# Patient Record
Sex: Female | Born: 1935
Health system: Southern US, Community
[De-identification: ages and names within clinical notes are randomized; demographics above are authoritative.]

## PROBLEM LIST (undated history)

## (undated) DIAGNOSIS — I10 Essential (primary) hypertension: Secondary | ICD-10-CM

## (undated) DIAGNOSIS — I5032 Chronic diastolic (congestive) heart failure: Secondary | ICD-10-CM

## (undated) DIAGNOSIS — C3490 Malignant neoplasm of unspecified part of unspecified bronchus or lung: Secondary | ICD-10-CM

## (undated) DIAGNOSIS — I2510 Atherosclerotic heart disease of native coronary artery without angina pectoris: Secondary | ICD-10-CM

## (undated) DIAGNOSIS — J45909 Unspecified asthma, uncomplicated: Secondary | ICD-10-CM

## (undated) DIAGNOSIS — I214 Non-ST elevation (NSTEMI) myocardial infarction: Secondary | ICD-10-CM

## (undated) DIAGNOSIS — I6529 Occlusion and stenosis of unspecified carotid artery: Secondary | ICD-10-CM

## (undated) DIAGNOSIS — J449 Chronic obstructive pulmonary disease, unspecified: Secondary | ICD-10-CM

## (undated) HISTORY — PX: KNEE SURGERY: SHX244

## (undated) HISTORY — PX: VESICOVAGINAL FISTULA CLOSURE W/ TAH: SUR271

## (undated) HISTORY — PX: FOOT SURGERY: SHX648

## (undated) HISTORY — DX: Occlusion and stenosis of unspecified carotid artery: I65.29

## (undated) HISTORY — DX: Chronic obstructive pulmonary disease, unspecified: J44.9

## (undated) HISTORY — DX: Non-ST elevation (NSTEMI) myocardial infarction: I21.4

## (undated) HISTORY — DX: Essential (primary) hypertension: I10

## (undated) HISTORY — PX: LUNG LOBECTOMY: SHX167

## (undated) HISTORY — PX: OTHER SURGICAL HISTORY: SHX169

## (undated) HISTORY — DX: Malignant neoplasm of unspecified part of unspecified bronchus or lung: C34.90

## (undated) HISTORY — DX: Atherosclerotic heart disease of native coronary artery without angina pectoris: I25.10

## (undated) HISTORY — PX: GALLBLADDER SURGERY: SHX652

## (undated) HISTORY — DX: Chronic diastolic (congestive) heart failure: I50.32

---

## 2001-02-20 ENCOUNTER — Encounter: Payer: Self-pay | Admitting: Orthopedic Surgery

## 2001-02-20 ENCOUNTER — Encounter: Admission: RE | Admit: 2001-02-20 | Discharge: 2001-02-20 | Payer: Self-pay | Admitting: Orthopedic Surgery

## 2009-01-09 ENCOUNTER — Inpatient Hospital Stay (HOSPITAL_COMMUNITY): Admission: EM | Admit: 2009-01-09 | Discharge: 2009-01-13 | Payer: Self-pay | Admitting: Emergency Medicine

## 2009-01-09 ENCOUNTER — Encounter (INDEPENDENT_AMBULATORY_CARE_PROVIDER_SITE_OTHER): Payer: Self-pay | Admitting: Interventional Radiology

## 2009-01-09 ENCOUNTER — Ambulatory Visit: Payer: Self-pay | Admitting: Cardiovascular Disease

## 2009-01-10 ENCOUNTER — Encounter (INDEPENDENT_AMBULATORY_CARE_PROVIDER_SITE_OTHER): Payer: Self-pay | Admitting: Internal Medicine

## 2009-01-11 ENCOUNTER — Ambulatory Visit: Payer: Self-pay | Admitting: Oncology

## 2009-01-12 ENCOUNTER — Ambulatory Visit: Payer: Self-pay | Admitting: Surgery

## 2009-01-13 ENCOUNTER — Ambulatory Visit: Payer: Self-pay | Admitting: Oncology

## 2009-01-16 ENCOUNTER — Ambulatory Visit (HOSPITAL_COMMUNITY): Admission: RE | Admit: 2009-01-16 | Discharge: 2009-01-16 | Payer: Self-pay | Admitting: Oncology

## 2009-01-21 ENCOUNTER — Ambulatory Visit: Payer: Self-pay | Admitting: Surgery

## 2009-01-24 ENCOUNTER — Ambulatory Visit: Admission: RE | Admit: 2009-01-24 | Discharge: 2009-04-23 | Payer: Self-pay | Admitting: Radiation Oncology

## 2009-02-10 ENCOUNTER — Inpatient Hospital Stay (HOSPITAL_COMMUNITY): Admission: RE | Admit: 2009-02-10 | Discharge: 2009-02-16 | Payer: Self-pay | Admitting: Surgery

## 2009-02-10 ENCOUNTER — Ambulatory Visit: Payer: Self-pay | Admitting: Cardiothoracic Surgery

## 2009-02-10 ENCOUNTER — Encounter: Payer: Self-pay | Admitting: Cardiothoracic Surgery

## 2009-02-20 ENCOUNTER — Ambulatory Visit: Payer: Self-pay | Admitting: Cardiothoracic Surgery

## 2009-02-24 ENCOUNTER — Ambulatory Visit: Payer: Self-pay | Admitting: Oncology

## 2009-02-24 LAB — COMPREHENSIVE METABOLIC PANEL
Albumin: 3.9 g/dL (ref 3.5–5.2)
BUN: 25 mg/dL — ABNORMAL HIGH (ref 6–23)
CO2: 23 mEq/L (ref 19–32)
Calcium: 9.3 mg/dL (ref 8.4–10.5)
Chloride: 102 mEq/L (ref 96–112)
Creatinine, Ser: 0.94 mg/dL (ref 0.40–1.20)
Glucose, Bld: 106 mg/dL — ABNORMAL HIGH (ref 70–99)
Potassium: 4.5 mEq/L (ref 3.5–5.3)

## 2009-02-24 LAB — CBC WITH DIFFERENTIAL/PLATELET
Basophils Absolute: 0 10*3/uL (ref 0.0–0.1)
Eosinophils Absolute: 0.3 10*3/uL (ref 0.0–0.5)
HCT: 39.4 % (ref 34.8–46.6)
HGB: 13.1 g/dL (ref 11.6–15.9)
NEUT#: 10.9 10*3/uL — ABNORMAL HIGH (ref 1.5–6.5)
NEUT%: 74.7 % (ref 38.4–76.8)
RDW: 14 % (ref 11.2–14.5)
lymph#: 2.3 10*3/uL (ref 0.9–3.3)

## 2009-03-06 ENCOUNTER — Encounter: Admission: RE | Admit: 2009-03-06 | Discharge: 2009-03-06 | Payer: Self-pay | Admitting: Cardiothoracic Surgery

## 2009-03-06 ENCOUNTER — Ambulatory Visit: Payer: Self-pay | Admitting: Cardiothoracic Surgery

## 2009-03-31 ENCOUNTER — Ambulatory Visit: Payer: Self-pay | Admitting: Cardiothoracic Surgery

## 2009-03-31 ENCOUNTER — Encounter: Admission: RE | Admit: 2009-03-31 | Discharge: 2009-03-31 | Payer: Self-pay | Admitting: Cardiothoracic Surgery

## 2009-06-03 ENCOUNTER — Ambulatory Visit: Payer: Self-pay | Admitting: Oncology

## 2009-06-05 ENCOUNTER — Ambulatory Visit (HOSPITAL_COMMUNITY): Admission: RE | Admit: 2009-06-05 | Discharge: 2009-06-05 | Payer: Self-pay | Admitting: Oncology

## 2009-06-05 LAB — CBC WITH DIFFERENTIAL/PLATELET
BASO%: 0.2 % (ref 0.0–2.0)
HGB: 15.2 g/dL (ref 11.6–15.9)
MONO#: 0.7 10*3/uL (ref 0.1–0.9)
MONO%: 8.1 % (ref 0.0–14.0)
Platelets: 341 10*3/uL (ref 145–400)

## 2009-06-05 LAB — COMPREHENSIVE METABOLIC PANEL
Albumin: 4 g/dL (ref 3.5–5.2)
BUN: 23 mg/dL (ref 6–23)
CO2: 28 mEq/L (ref 19–32)
Calcium: 9.4 mg/dL (ref 8.4–10.5)
Chloride: 103 mEq/L (ref 96–112)
Glucose, Bld: 102 mg/dL — ABNORMAL HIGH (ref 70–99)
Potassium: 3.8 mEq/L (ref 3.5–5.3)

## 2009-12-05 ENCOUNTER — Ambulatory Visit: Payer: Self-pay | Admitting: Oncology

## 2009-12-09 ENCOUNTER — Ambulatory Visit (HOSPITAL_COMMUNITY): Admission: RE | Admit: 2009-12-09 | Discharge: 2009-12-09 | Payer: Self-pay | Admitting: Oncology

## 2009-12-09 LAB — CBC WITH DIFFERENTIAL/PLATELET
BASO%: 0.3 % (ref 0.0–2.0)
Basophils Absolute: 0 10*3/uL (ref 0.0–0.1)
HCT: 41.3 % (ref 34.8–46.6)
HGB: 14 g/dL (ref 11.6–15.9)
LYMPH%: 22.4 % (ref 14.0–49.7)
MCH: 28.5 pg (ref 25.1–34.0)
MCHC: 34 g/dL (ref 31.5–36.0)
NEUT%: 67.5 % (ref 38.4–76.8)
Platelets: 362 10*3/uL (ref 145–400)
lymph#: 2 10*3/uL (ref 0.9–3.3)

## 2009-12-09 LAB — COMPREHENSIVE METABOLIC PANEL
ALT: 23 U/L (ref 0–35)
AST: 19 U/L (ref 0–37)
Alkaline Phosphatase: 141 U/L — ABNORMAL HIGH (ref 39–117)
BUN: 22 mg/dL (ref 6–23)
Chloride: 106 mEq/L (ref 96–112)
Creatinine, Ser: 0.8 mg/dL (ref 0.40–1.20)
Sodium: 141 mEq/L (ref 135–145)
Total Bilirubin: 0.4 mg/dL (ref 0.3–1.2)
Total Protein: 7.3 g/dL (ref 6.0–8.3)

## 2010-01-20 ENCOUNTER — Inpatient Hospital Stay (HOSPITAL_COMMUNITY): Admission: EM | Admit: 2010-01-20 | Discharge: 2010-01-24 | Payer: Self-pay | Admitting: Emergency Medicine

## 2010-01-20 ENCOUNTER — Ambulatory Visit: Payer: Self-pay | Admitting: Cardiology

## 2010-01-30 ENCOUNTER — Ambulatory Visit: Payer: Self-pay | Admitting: Cardiology

## 2010-01-30 ENCOUNTER — Encounter: Admission: RE | Admit: 2010-01-30 | Discharge: 2010-01-30 | Payer: Self-pay | Admitting: Cardiology

## 2010-02-11 ENCOUNTER — Ambulatory Visit: Payer: Self-pay | Admitting: Cardiology

## 2010-03-06 ENCOUNTER — Ambulatory Visit: Payer: Self-pay | Admitting: Cardiology

## 2010-04-06 ENCOUNTER — Ambulatory Visit: Payer: Self-pay | Admitting: Cardiology

## 2010-05-15 ENCOUNTER — Other Ambulatory Visit: Payer: Self-pay | Admitting: Oncology

## 2010-05-15 DIAGNOSIS — C3490 Malignant neoplasm of unspecified part of unspecified bronchus or lung: Secondary | ICD-10-CM

## 2010-06-09 ENCOUNTER — Other Ambulatory Visit (HOSPITAL_COMMUNITY): Payer: Self-pay

## 2010-06-29 ENCOUNTER — Other Ambulatory Visit: Payer: Self-pay

## 2010-07-06 ENCOUNTER — Ambulatory Visit: Payer: Self-pay | Admitting: Cardiology

## 2010-07-06 ENCOUNTER — Ambulatory Visit (INDEPENDENT_AMBULATORY_CARE_PROVIDER_SITE_OTHER): Payer: Self-pay | Admitting: Cardiology

## 2010-07-06 DIAGNOSIS — I2510 Atherosclerotic heart disease of native coronary artery without angina pectoris: Secondary | ICD-10-CM

## 2010-07-09 LAB — GLUCOSE, CAPILLARY
Glucose-Capillary: 93 mg/dL (ref 70–99)
Glucose-Capillary: 96 mg/dL (ref 70–99)

## 2010-07-09 LAB — MAGNESIUM: Magnesium: 1.7 mg/dL (ref 1.5–2.5)

## 2010-07-09 LAB — POCT I-STAT, CHEM 8
Creatinine, Ser: 0.8 mg/dL (ref 0.4–1.2)
Hemoglobin: 14.6 g/dL (ref 12.0–15.0)
Potassium: 3.8 mEq/L (ref 3.5–5.1)
Sodium: 142 mEq/L (ref 135–145)

## 2010-07-09 LAB — CBC
HCT: 39.7 % (ref 36.0–46.0)
Hemoglobin: 12.3 g/dL (ref 12.0–15.0)
MCH: 27 pg (ref 26.0–34.0)
MCH: 27.6 pg (ref 26.0–34.0)
MCHC: 31 g/dL (ref 30.0–36.0)
MCHC: 31.2 g/dL (ref 30.0–36.0)
MCV: 86.4 fL (ref 78.0–100.0)
Platelets: 309 10*3/uL (ref 150–400)
Platelets: 313 10*3/uL (ref 150–400)
RBC: 4.54 MIL/uL (ref 3.87–5.11)
RBC: 4.71 MIL/uL (ref 3.87–5.11)
RBC: 4.86 MIL/uL (ref 3.87–5.11)
WBC: 10.5 10*3/uL (ref 4.0–10.5)

## 2010-07-09 LAB — COMPREHENSIVE METABOLIC PANEL
ALT: 22 U/L (ref 0–35)
AST: 45 U/L — ABNORMAL HIGH (ref 0–37)
AST: 45 U/L — ABNORMAL HIGH (ref 0–37)
Albumin: 2.8 g/dL — ABNORMAL LOW (ref 3.5–5.2)
Alkaline Phosphatase: 118 U/L — ABNORMAL HIGH (ref 39–117)
Alkaline Phosphatase: 118 U/L — ABNORMAL HIGH (ref 39–117)
BUN: 14 mg/dL (ref 6–23)
CO2: 27 mEq/L (ref 19–32)
Chloride: 110 mEq/L (ref 96–112)
Chloride: 113 mEq/L — ABNORMAL HIGH (ref 96–112)
GFR calc Af Amer: >60 mL/min (ref >60)
GFR calc Af Amer: >60 mL/min (ref >60)
GFR calc non Af Amer: >60 mL/min (ref >60)
Glucose, Bld: 107 mg/dL — ABNORMAL HIGH (ref 70–99)
Potassium: 3.5 mEq/L (ref 3.5–5.1)
Sodium: 142 mEq/L (ref 135–145)
Total Bilirubin: 0.2 mg/dL — ABNORMAL LOW (ref 0.3–1.2)
Total Bilirubin: 0.4 mg/dL (ref 0.3–1.2)
Total Protein: 5.6 g/dL — ABNORMAL LOW (ref 6.0–8.3)

## 2010-07-09 LAB — CARDIAC PANEL(CRET KIN+CKTOT+MB+TROPI)
CK, MB: 33.5 ng/mL (ref 0.3–4.0)
Relative Index: 11.9 — ABNORMAL HIGH (ref 0.0–2.5)

## 2010-07-09 LAB — BASIC METABOLIC PANEL
BUN: 12 mg/dL (ref 6–23)
CO2: 31 mEq/L (ref 19–32)
Calcium: 8.7 mg/dL (ref 8.4–10.5)
Chloride: 105 mEq/L (ref 96–112)
Creatinine, Ser: 0.72 mg/dL (ref 0.4–1.2)
Glucose, Bld: 108 mg/dL — ABNORMAL HIGH (ref 70–99)

## 2010-07-09 LAB — MRSA PCR SCREENING: MRSA by PCR: NEGATIVE

## 2010-07-09 LAB — DIFFERENTIAL
Eosinophils Absolute: 0.2 10*3/uL (ref 0.0–0.7)
Lymphocytes Relative: 21 % (ref 12–46)
Lymphs Abs: 2.2 10*3/uL (ref 0.7–4.0)
Neutrophils Relative %: 69 % (ref 43–77)

## 2010-07-09 LAB — LIPID PANEL
Cholesterol: 149 mg/dL (ref 0–200)
LDL Cholesterol: 78 mg/dL (ref 0–99)
Total CHOL/HDL Ratio: 3.5 RATIO
Triglycerides: 140 mg/dL (ref <150)
VLDL: 28 mg/dL (ref 0–40)

## 2010-07-09 LAB — POCT CARDIAC MARKERS
CKMB, poc: 17.9 ng/mL (ref 1.0–8.0)
Myoglobin, poc: 242 ng/mL (ref 12–200)

## 2010-07-09 LAB — HEMOGLOBIN A1C: Mean Plasma Glucose: 140 mg/dL — ABNORMAL HIGH (ref <117)

## 2010-07-09 LAB — HEPARIN LEVEL (UNFRACTIONATED): Heparin Unfractionated: 0.14 IU/mL — ABNORMAL LOW (ref 0.30–0.70)

## 2010-07-30 LAB — TYPE AND SCREEN
ABO/RH(D): O POS
Antibody Screen: NEGATIVE

## 2010-07-30 LAB — POCT I-STAT 3, ART BLOOD GAS (G3+)
Acid-Base Excess: 4 mmol/L — ABNORMAL HIGH (ref 0.0–2.0)
Patient temperature: 98.4

## 2010-07-30 LAB — COMPREHENSIVE METABOLIC PANEL
ALT: 44 U/L — ABNORMAL HIGH (ref 0–35)
ALT: 50 U/L — ABNORMAL HIGH (ref 0–35)
ALT: 27 U/L (ref 0–35)
AST: 33 U/L (ref 0–37)
AST: 39 U/L — ABNORMAL HIGH (ref 0–37)
AST: 23 U/L (ref 0–37)
Albumin: 2.6 g/dL — ABNORMAL LOW (ref 3.5–5.2)
Albumin: 2.9 g/dL — ABNORMAL LOW (ref 3.5–5.2)
Albumin: 3.8 g/dL (ref 3.5–5.2)
Alkaline Phosphatase: 99 U/L (ref 39–117)
Alkaline Phosphatase: 115 U/L (ref 39–117)
BUN: 12 mg/dL (ref 6–23)
BUN: 16 mg/dL (ref 6–23)
CO2: 30 mEq/L (ref 19–32)
CO2: 32 mEq/L (ref 19–32)
Calcium: 8.6 mg/dL (ref 8.4–10.5)
Calcium: 8.9 mg/dL (ref 8.4–10.5)
Chloride: 99 mEq/L (ref 96–112)
Chloride: 107 mEq/L (ref 96–112)
Creatinine, Ser: 0.72 mg/dL (ref 0.4–1.2)
GFR calc Af Amer: >60 mL/min (ref >60)
GFR calc Af Amer: >60 mL/min (ref >60)
GFR calc non Af Amer: >60 mL/min (ref >60)
Glucose, Bld: 107 mg/dL — ABNORMAL HIGH (ref 70–99)
Potassium: 4.3 mEq/L (ref 3.5–5.1)
Potassium: 4.6 mEq/L (ref 3.5–5.1)
Sodium: 134 mEq/L — ABNORMAL LOW (ref 135–145)
Sodium: 137 mEq/L (ref 135–145)
Sodium: 139 mEq/L (ref 135–145)
Total Bilirubin: 0.6 mg/dL (ref 0.3–1.2)
Total Bilirubin: 0.5 mg/dL (ref 0.3–1.2)
Total Protein: 5.3 g/dL — ABNORMAL LOW (ref 6.0–8.3)
Total Protein: 5.4 g/dL — ABNORMAL LOW (ref 6.0–8.3)
Total Protein: 6.1 g/dL (ref 6.0–8.3)

## 2010-07-30 LAB — MRSA PCR SCREENING: MRSA by PCR: NEGATIVE

## 2010-07-30 LAB — URINE MICROSCOPIC-ADD ON

## 2010-07-30 LAB — URINALYSIS, ROUTINE W REFLEX MICROSCOPIC
Bilirubin Urine: NEGATIVE
Glucose, UA: NEGATIVE mg/dL
Specific Gravity, Urine: 1.007 (ref 1.005–1.030)
Urobilinogen, UA: 0.2 mg/dL (ref 0.0–1.0)
pH: 6 (ref 5.0–8.0)

## 2010-07-30 LAB — BLOOD GAS, ARTERIAL
Bicarbonate: 25.6 mEq/L — ABNORMAL HIGH (ref 20.0–24.0)
Patient temperature: 98.6
TCO2: 26.9 mmol/L (ref 0–100)
pCO2 arterial: 42.7 mmHg (ref 35.0–45.0)
pH, Arterial: 7.395 (ref 7.350–7.400)

## 2010-07-30 LAB — CBC
HCT: 36.9 % (ref 36.0–46.0)
HCT: 42.9 % (ref 36.0–46.0)
MCHC: 33.9 g/dL (ref 30.0–36.0)
MCV: 88.2 fL (ref 78.0–100.0)
Platelets: 240 10*3/uL (ref 150–400)
Platelets: 255 10*3/uL (ref 150–400)
Platelets: 303 10*3/uL (ref 150–400)
RBC: 4.1 MIL/uL (ref 3.87–5.11)
RDW: 14.4 % (ref 11.5–15.5)
RDW: 14.5 % (ref 11.5–15.5)
RDW: 13.9 % (ref 11.5–15.5)
WBC: 8.5 10*3/uL (ref 4.0–10.5)

## 2010-07-30 LAB — BASIC METABOLIC PANEL
BUN: 10 mg/dL (ref 6–23)
Chloride: 103 mEq/L (ref 96–112)
Creatinine, Ser: 0.64 mg/dL (ref 0.4–1.2)
GFR calc non Af Amer: >60 mL/min (ref >60)
Glucose, Bld: 167 mg/dL — ABNORMAL HIGH (ref 70–99)
Potassium: 4.2 mEq/L (ref 3.5–5.1)

## 2010-07-30 LAB — GLUCOSE, CAPILLARY
Glucose-Capillary: 108 mg/dL — ABNORMAL HIGH (ref 70–99)
Glucose-Capillary: 110 mg/dL — ABNORMAL HIGH (ref 70–99)
Glucose-Capillary: 111 mg/dL — ABNORMAL HIGH (ref 70–99)
Glucose-Capillary: 125 mg/dL — ABNORMAL HIGH (ref 70–99)
Glucose-Capillary: 83 mg/dL (ref 70–99)

## 2010-07-31 LAB — URINALYSIS, ROUTINE W REFLEX MICROSCOPIC
Bilirubin Urine: NEGATIVE
Glucose, UA: NEGATIVE mg/dL
Ketones, ur: NEGATIVE mg/dL
Protein, ur: 30 mg/dL — AB
pH: 5.5 (ref 5.0–8.0)

## 2010-07-31 LAB — DIFFERENTIAL
Basophils Absolute: 0 10*3/uL (ref 0.0–0.1)
Basophils Relative: 0 % (ref 0–1)
Eosinophils Absolute: 0.1 10*3/uL (ref 0.0–0.7)
Monocytes Absolute: 1 10*3/uL (ref 0.1–1.0)
Monocytes Relative: 8 % (ref 3–12)
Neutrophils Relative %: 72 % (ref 43–77)

## 2010-07-31 LAB — COMPREHENSIVE METABOLIC PANEL
ALT: 25 U/L (ref 0–35)
ALT: 20 U/L (ref 0–35)
AST: 20 U/L (ref 0–37)
Alkaline Phosphatase: 76 U/L (ref 39–117)
Alkaline Phosphatase: 94 U/L (ref 39–117)
Alkaline Phosphatase: 91 U/L (ref 39–117)
BUN: 8 mg/dL (ref 6–23)
CO2: 25 mEq/L (ref 19–32)
CO2: 30 mEq/L (ref 19–32)
Calcium: 8.6 mg/dL (ref 8.4–10.5)
Chloride: 105 mEq/L (ref 96–112)
Chloride: 110 mEq/L (ref 96–112)
Chloride: 105 mEq/L (ref 96–112)
Creatinine, Ser: 0.73 mg/dL (ref 0.4–1.2)
GFR calc Af Amer: >60 mL/min (ref >60)
GFR calc non Af Amer: >60 mL/min (ref >60)
GFR calc non Af Amer: >60 mL/min (ref >60)
GFR calc non Af Amer: >60 mL/min (ref >60)
Glucose, Bld: 92 mg/dL (ref 70–99)
Glucose, Bld: 96 mg/dL (ref 70–99)
Glucose, Bld: 94 mg/dL (ref 70–99)
Potassium: 3.9 mEq/L (ref 3.5–5.1)
Potassium: 4 mEq/L (ref 3.5–5.1)
Potassium: 3.5 mEq/L (ref 3.5–5.1)
Sodium: 144 mEq/L (ref 135–145)
Sodium: 142 mEq/L (ref 135–145)
Total Bilirubin: 0.7 mg/dL (ref 0.3–1.2)
Total Bilirubin: 0.8 mg/dL (ref 0.3–1.2)
Total Protein: 5.8 g/dL — ABNORMAL LOW (ref 6.0–8.3)

## 2010-07-31 LAB — CBC
HCT: 37.3 % (ref 36.0–46.0)
HCT: 39.5 % (ref 36.0–46.0)
Hemoglobin: 12.6 g/dL (ref 12.0–15.0)
Hemoglobin: 12.6 g/dL (ref 12.0–15.0)
Hemoglobin: 13.4 g/dL (ref 12.0–15.0)
MCHC: 33.9 g/dL (ref 30.0–36.0)
MCHC: 34.8 g/dL (ref 30.0–36.0)
MCV: 87.8 fL (ref 78.0–100.0)
MCV: 87.5 fL (ref 78.0–100.0)
Platelets: 310 10*3/uL (ref 150–400)
Platelets: 342 10*3/uL (ref 150–400)
RBC: 4.17 MIL/uL (ref 3.87–5.11)
RBC: 4.8 MIL/uL (ref 3.87–5.11)
RDW: 13.9 % (ref 11.5–15.5)
RDW: 14 % (ref 11.5–15.5)
WBC: 8.8 10*3/uL (ref 4.0–10.5)
WBC: 9 10*3/uL (ref 4.0–10.5)

## 2010-07-31 LAB — POCT I-STAT, CHEM 8
Calcium, Ion: 0.99 mmol/L — ABNORMAL LOW (ref 1.12–1.32)
Creatinine, Ser: 0.7 mg/dL (ref 0.4–1.2)
Glucose, Bld: 103 mg/dL — ABNORMAL HIGH (ref 70–99)
Hemoglobin: 15.3 g/dL — ABNORMAL HIGH (ref 12.0–15.0)
TCO2: 28 mmol/L (ref 0–100)

## 2010-07-31 LAB — PROTIME-INR
INR: 1 (ref 0.00–1.49)
INR: 1 (ref 0.00–1.49)
Prothrombin Time: 13.3 seconds (ref 11.6–15.2)

## 2010-07-31 LAB — LIPASE, BLOOD
Lipase: 35 U/L (ref 11–59)
Lipase: 60 U/L — ABNORMAL HIGH (ref 11–59)

## 2010-07-31 LAB — CARDIAC PANEL(CRET KIN+CKTOT+MB+TROPI)
CK, MB: 0.6 ng/mL (ref 0.3–4.0)
CK, MB: 0.6 ng/mL (ref 0.3–4.0)
Relative Index: INVALID (ref 0.0–2.5)
Total CK: 47 U/L (ref 7–177)
Troponin I: <0.01 ng/mL (ref 0.00–0.06)

## 2010-07-31 LAB — BASIC METABOLIC PANEL
BUN: 13 mg/dL (ref 6–23)
CO2: 33 mEq/L — ABNORMAL HIGH (ref 19–32)
Calcium: 9.2 mg/dL (ref 8.4–10.5)
Glucose, Bld: 112 mg/dL — ABNORMAL HIGH (ref 70–99)
Sodium: 141 mEq/L (ref 135–145)

## 2010-07-31 LAB — URINE MICROSCOPIC-ADD ON

## 2010-07-31 LAB — LIPID PANEL
Cholesterol: 164 mg/dL (ref 0–200)
HDL: 60 mg/dL (ref >39)
Triglycerides: 98 mg/dL (ref <150)

## 2010-07-31 LAB — URINE CULTURE: Colony Count: NO GROWTH

## 2010-07-31 LAB — CEA: CEA: 3.5 ng/mL (ref 0.0–5.0)

## 2010-08-03 ENCOUNTER — Other Ambulatory Visit: Payer: Self-pay | Admitting: Oncology

## 2010-08-03 DIAGNOSIS — C3490 Malignant neoplasm of unspecified part of unspecified bronchus or lung: Secondary | ICD-10-CM

## 2010-08-07 ENCOUNTER — Other Ambulatory Visit (HOSPITAL_COMMUNITY): Payer: Medicare Other

## 2010-09-02 ENCOUNTER — Other Ambulatory Visit: Payer: Self-pay | Admitting: Nurse Practitioner

## 2010-09-04 ENCOUNTER — Other Ambulatory Visit: Payer: Self-pay | Admitting: *Deleted

## 2010-09-04 ENCOUNTER — Telehealth: Payer: Self-pay | Admitting: Nurse Practitioner

## 2010-09-04 MED ORDER — LOSARTAN POTASSIUM 100 MG PO TABS: 100.0000 mg | ORAL_TABLET | Freq: Every day | ORAL | Status: DC

## 2010-09-04 NOTE — Telephone Encounter (Signed)
Refill done.  

## 2010-09-04 NOTE — Telephone Encounter (Signed)
CALLING back to f/u on refill request on [place]

## 2010-09-08 NOTE — Assessment & Plan Note (Signed)
OFFICE VISIT   Snavely, Brandy Cameron  DOB:  Dec 17, 1935                                        January 21, 2009  CHART #:  [id number]   The patient returned today for follow up in further discussion of left  upper lobe adenocarcinoma.  Please see my initial consultation from  01/12/2009 for her full history and physical.  She has subsequently  undergone a PET scan on 01/16/2009.  This showed mild hypermetabolic  activity within the left upper lobe lung mass with an SUV of 4.1  consistent with her diagnosis of bronchoalveolar cell carcinoma.  There  is no other abnormal metabolic activity within the neck, chest, abdomen,  or pelvis.  She also underwent pulmonary function testing on 01/13/2009  which showed an FEV-1 of 1.10 which was 65% of predicted with an FVC of  1.70.  Her diffusion capacity uncorrected was 63% of predicted.  The age  used for her pulmonary function tests was 65 but actually she is 84.  She said she has been doing about the same.  She did have a significant  asthma episode prior to discharge from the hospital recently which she  feels is triggered by some cleaning in the hall that they were doing.  She has been walking since discharge without any significant shortness  of breath.  She denies any cough or sputum production.  She said that  she did develop shingles on her left buttock for which she was started  on treatment prior to discharge with acyclovir.   PHYSICAL EXAMINATION:  Vital Signs:  Today, her blood pressure is 162/89  and her pulse is 86 and regular.  Respiratory rate is 18 and unlabored.  Oxygen saturation on room air is 95%.  General:  She looks well.  Cardiac:  Regular rate and rhythm with normal heart sounds.  Lungs:  Clear.   IMPRESSION:  The patient has a 2.9 x 1.6 cm left upper lobe  bronchoalveolar cell carcinoma with no other hypermetabolic activity  noted on PET scan.  She was also noted to have a 0.5 x 0.4 cm  triangular  nodular density along the minor fissure on the right as well as an 8 x 7-  mm noncalcified nodule in the right lower lobe.  These did not show up  on the PET scan as expected.  She has no evidence of metastatic disease.  Her pulmonary function tests are marginal for performing a left upper  lobectomy and I think that given her age and her history of asthma that  we are probably best off to perform a wedge resection of this relatively  peripheral lesion.  She may also benefit from radioactive seed implant  at the time of wedge resection.  I will schedule an appointment for her  to see Dr. Margaretmary Dys of Radiation Oncology to discuss this  further.  She was initially admitted with a diagnosis of pancreatitis  and abdominal pain, but this has completely resolved and she has been  eating well without abdominal pain at this time.  Hopefully, we can  perform her surgery in the next few weeks after she gets a chance to be  evaluated further by Dr. Kathrynn Running.  I reviewed her CT scan, PET scan, and  PFTs with her and her family, and discussed the options for  treatment  including lobectomy and my recommendation for wedge resection with  possible seed implant.  We discussed the pros and cons of both  approaches, and the benefits and risks of surgery.  I discussed the risk  of bleeding, blood transfusion, infection, prolonged air leak, recurrent  cancer, respiratory failure, and death.  She understands all these and  would like to proceed with surgery as quickly as possible.  She is in  agreement with the plan for wedge resection and evaluation by Dr.  Kathrynn Running for radioactive seed implant.   Evelene Croon, M.D.  Electronically Signed   BB/MEDQ  D:  01/21/2009  T:  01/22/2009  Job:  [id number]   cc:   Artist Pais Kathrynn Running, M.D.

## 2010-09-08 NOTE — Assessment & Plan Note (Signed)
OFFICE VISIT   Brandy Cameron, Brandy Cameron  DOB:  1935-11-20                                        March 31, 2009  CHART #:  [id number]   HISTORY:  The patient is a 75 year old female who is status post left  upper lobe wedge resection and radioactive seed implantation by Dr.  Tyrone Cameron on February 10, 2009, for adenocarcinoma of the left upper lobe.  She tolerated the procedure quite well, was discharged on February 15, 2009, in very stable condition.  Post discharge, she did have a  development of bronchitis and was seen and treated by her family  physician with Avelox.  She continues to take that and has approximately  3 more days.  She was discharged on oxygen from the hospital but  currently her use is only at nighttime.  As far as symptoms go  primarily, she describes some discomfort in the anterior ribs on the  left side.  This is moderately severe in nature.  She states she does  not take any narcotics at this point.  She is increasing her activities  and is tolerating this fairly well.   DIAGNOSTIC TESTS:  A chest x-ray was obtained on today's date and this  reveals some postoperative changes in the left hemithorax with minimal  left basilar atelectasis or scar.  The left-sided effusion appears to  have completely resolved as well.   PHYSICAL EXAMINATION:  Vital Signs:  Blood pressure is 138/82, pulse is  88, respirations 18, oxygen saturation is 94% on room air.  General:  This is a well-developed, well-appearing white female in no acute  distress.  Pulmonary:  Clear lungs throughout.  Cardiac:  Regular rate  and rhythm.  Incisions are examined and are healing well without  evidence of infection.  She does have some tenderness to the anterior  ribs on the left side.   ASSESSMENT:  The patient is making adequate progress.  She has been  followed up with Dr. Gaylyn Cameron in oncology and they do not feel as though she  will require chemotherapy at this time.  His  plan is to obtain a CT scan  in approximately 3 months.  We will have the patient return at that  time to see Dr. Tyrone Cameron.  I have instructed that she use short-time  course of over-the-counter nonsteroidals with her rib discomfort.    Brandy Cameron, P.A.-C.   Brandy Cameron  D:  03/31/2009  T:  04/01/2009  Job:  [id number]   cc:   Brandy Cameron, M.D.  Brandy Bolus, MD

## 2010-09-08 NOTE — Assessment & Plan Note (Signed)
OFFICE VISIT   Cameron, Brandy BOUZA  DOB:  25-Jan-1936                                        March 06, 2009  CHART #:  [id number]   The patient is seen in the office on March 06, 2009, returns  following left mini thoracotomy, left upper lobe wedge resection for  adenocarcinoma of the lung with placement of radioactive seeds.  The  patient has known underlying pulmonary disease, underwent wedge  resection with seed implantation on February 10, 2009.  She was  discharged home on oxygen.  She seems to be doing relatively well at  home.  She continues on her home oxygen.  She has stopped using her  nebulizers, but continues on Mucinex.  She had 2 days ago, small flecks  of blood noted in her sputum, but now since has cleared.   On exam, her blood pressure 116/72, pulse 87, respiratory rate is 18, O2  sats 94% on 2 L.  Her left incision is well healed.  She has some coarse  rhonchi throughout both lung fields but without active wheezing.  She  has no calf tenderness.   Chest x-ray shows good position of the seeds without evidence of  pneumothorax or effusions, overall appears to be a satisfactory  postoperative film.   I have encouraged the patient to slowly decrease the pain medicine she  is taking.  She will continue on oxygen for now.  I have also suggested  that she go back using her bronchodilators twice a day and then use the  Mucinex just when she has thick secretions.  We will plan to see her  back with a followup chest x-ray in 3-4 weeks and then consider weaning  her oxygen off.   Sheliah Plane, MD  Electronically Signed   EG/MEDQ  D:  03/06/2009  T:  03/06/2009  Job:  [id number]   cc:   Jethro Bolus, MD

## 2010-10-01 ENCOUNTER — Encounter: Payer: Self-pay | Admitting: Cardiology

## 2010-10-18 ENCOUNTER — Other Ambulatory Visit: Payer: Self-pay | Admitting: Cardiology

## 2010-11-11 ENCOUNTER — Encounter: Payer: Self-pay | Admitting: Cardiology

## 2010-11-11 ENCOUNTER — Ambulatory Visit (INDEPENDENT_AMBULATORY_CARE_PROVIDER_SITE_OTHER): Payer: Medicare Other | Admitting: Cardiology

## 2010-11-11 DIAGNOSIS — E785 Hyperlipidemia, unspecified: Secondary | ICD-10-CM

## 2010-11-11 DIAGNOSIS — C3490 Malignant neoplasm of unspecified part of unspecified bronchus or lung: Secondary | ICD-10-CM

## 2010-11-11 DIAGNOSIS — C3412 Malignant neoplasm of upper lobe, left bronchus or lung: Secondary | ICD-10-CM | POA: Insufficient documentation

## 2010-11-11 DIAGNOSIS — I2510 Atherosclerotic heart disease of native coronary artery without angina pectoris: Secondary | ICD-10-CM | POA: Insufficient documentation

## 2010-11-11 DIAGNOSIS — I10 Essential (primary) hypertension: Secondary | ICD-10-CM | POA: Insufficient documentation

## 2010-11-11 MED ORDER — LOSARTAN POTASSIUM 100 MG PO TABS: 100.0000 mg | ORAL_TABLET | Freq: Every day | ORAL | Status: DC

## 2010-11-11 MED ORDER — METOPROLOL TARTRATE 25 MG PO TABS: 25.0000 mg | ORAL_TABLET | Freq: Two times a day (BID) | ORAL | Status: DC

## 2010-11-11 MED ORDER — SIMVASTATIN 40 MG PO TABS: 40.0000 mg | ORAL_TABLET | Freq: Every day | ORAL | Status: DC

## 2010-11-11 NOTE — Patient Instructions (Signed)
Your physician wants you to follow-up in: ONE YEAR  You will receive a reminder letter in the mail two months in advance. If you don't receive a letter, please call our office to schedule the follow-up appointment.   Your physician recommends that you return for lab work WHEN FASTING

## 2010-11-11 NOTE — Assessment & Plan Note (Signed)
Continue aspirin and statin. 

## 2010-11-11 NOTE — Assessment & Plan Note (Signed)
Blood pressure controlled. Continue present medications. Check potassium and renal function. 

## 2010-11-11 NOTE — Assessment & Plan Note (Signed)
Status post resection. Management per primary care. This is the most likely cause of her dyspnea.

## 2010-11-11 NOTE — Progress Notes (Signed)
HPI:75 year old female for followup of coronary artery disease. Previously followed by Dr. Deborah Chalk. Cardiac catheterization in September of 2011 revealed  Left main coronary is normal. Left circumflex has irregularities. Left anterior descending ends on the anterior wall.  There is a 50%  focal stenosis in the midportion of the vessel and scattered irregularities otherwise. Right coronary artery:  The right coronary artery is a very large dominant vessel.  It does have a large posterior descending vessel that would extend to the area that is akinetic on the left ventriculogram.  There is a 50% stenosis in the proximal right coronary artery, but otherwise no significant focal stenosis is present. Inferior apical akinesis with overall well-preserved left ventricular function in a somewhat hyperdynamic manner. She was last seen in Dec 2011. Since then, she does have some dyspnea on exertion which has been present since her lung resection. There is no orthopnea or PND but there is occasional mild pedal edema. She has had 2 episodes of chest pain since December that required nitroglycerin but otherwise denies exertional chest pain. No syncope.  Current Outpatient Prescriptions  Medication Sig Dispense Refill  . aspirin 81 MG tablet Take 81 mg by mouth daily.        . furosemide (LASIX) 40 MG tablet Take 40 mg by mouth daily.        Marland Kitchen losartan (COZAAR) 100 MG tablet TAKE 1 TABLET BY MOUTH EVERY DAY  30 tablet  0  . metoprolol tartrate (LOPRESSOR) 25 MG tablet Take 25 mg by mouth 2 (two) times daily.        Marland Kitchen NITROGLYCERIN SL Place under the tongue as directed.        . potassium chloride SA (K-DUR,KLOR-CON) 20 MEQ tablet Take 20 mEq by mouth 2 (two) times daily.        . simvastatin (ZOCOR) 40 MG tablet TAKE ONE TABLET BY MOUTH DAILY  30 tablet  3  . losartan (COZAAR) 100 MG tablet Take 1 tablet (100 mg total) by mouth daily.  30 tablet  5     Past Medical History  Diagnosis Date  . NSTEMI (non-ST  elevated myocardial infarction)     with only mild to moderate CAD in September of 2011,There was concern for apival ballooning. She had hyperdynamic LV function.  . Lung cancer     [name]  Marland Kitchen HTN (hypertension)   . COPD (chronic obstructive pulmonary disease)   . Tobacco abuse     Past Surgical History  Procedure Date  . Knee surgery   . Vesicovaginal fistula closure w/ tah   . Gallbladder surgery   . Cataract surgery   . Foot surgery     History   Social History  . Marital Status: Divorced    Spouse Name: N/A    Number of Children: N/A  . Years of Education: N/A   Occupational History  . Not on file.   Social History Main Topics  . Smoking status: Former Games developer  . Smokeless tobacco: Not on file   Comment: She stopped smoking four years ago.  . Alcohol Use: No  . Drug Use: Not on file  . Sexually Active: Not on file   Other Topics Concern  . Not on file   Social History Narrative  . No narrative on file    ROS: no fevers or chills, productive cough, hemoptysis, dysphasia, odynophagia, melena, hematochezia, dysuria, hematuria, rash,  seizure activity, orthopnea, PND, pedal edema, claudication. Remaining systems are negative.  Physical Exam: Well-developed well-nourished in no acute distress.  Skin is warm and dry.  HEENT is normal.  Neck is supple. No thyromegaly.  Chest is clear to auscultation with normal expansion.  Cardiovascular exam is regular rate and rhythm.  Abdominal exam nontender or distended. No masses palpated. Extremities show no edema. neuro grossly intact  ECG Sinus rhythm at a rate of 73. No ST changes.

## 2010-11-11 NOTE — Assessment & Plan Note (Signed)
Continue statin. Check lipids and liver. 

## 2010-11-19 ENCOUNTER — Inpatient Hospital Stay (INDEPENDENT_AMBULATORY_CARE_PROVIDER_SITE_OTHER)
Admission: RE | Admit: 2010-11-19 | Discharge: 2010-11-19 | Disposition: A | Discharge status: Home / Self Care | Payer: Medicare Other | Source: Ambulatory Visit | Attending: Family Medicine | Admitting: Family Medicine

## 2010-11-19 ENCOUNTER — Encounter: Payer: Self-pay | Admitting: Family Medicine

## 2010-11-19 DIAGNOSIS — L259 Unspecified contact dermatitis, unspecified cause: Secondary | ICD-10-CM

## 2010-11-19 DIAGNOSIS — I10 Essential (primary) hypertension: Secondary | ICD-10-CM | POA: Insufficient documentation

## 2010-11-19 DIAGNOSIS — I2510 Atherosclerotic heart disease of native coronary artery without angina pectoris: Secondary | ICD-10-CM | POA: Insufficient documentation

## 2010-12-07 ENCOUNTER — Other Ambulatory Visit: Payer: Self-pay | Admitting: Oncology

## 2010-12-07 ENCOUNTER — Ambulatory Visit (HOSPITAL_COMMUNITY)
Admission: RE | Admit: 2010-12-07 | Discharge: 2010-12-07 | Disposition: A | Discharge status: Home / Self Care | Payer: Medicare Other | Source: Ambulatory Visit | Attending: Oncology | Admitting: Oncology

## 2010-12-07 ENCOUNTER — Encounter (HOSPITAL_BASED_OUTPATIENT_CLINIC_OR_DEPARTMENT_OTHER): Payer: Medicare Other | Admitting: Oncology

## 2010-12-07 ENCOUNTER — Encounter (HOSPITAL_COMMUNITY): Payer: Self-pay

## 2010-12-07 DIAGNOSIS — K449 Diaphragmatic hernia without obstruction or gangrene: Secondary | ICD-10-CM | POA: Insufficient documentation

## 2010-12-07 DIAGNOSIS — R0989 Other specified symptoms and signs involving the circulatory and respiratory systems: Secondary | ICD-10-CM | POA: Insufficient documentation

## 2010-12-07 DIAGNOSIS — R042 Hemoptysis: Secondary | ICD-10-CM | POA: Insufficient documentation

## 2010-12-07 DIAGNOSIS — N289 Disorder of kidney and ureter, unspecified: Secondary | ICD-10-CM | POA: Insufficient documentation

## 2010-12-07 DIAGNOSIS — R911 Solitary pulmonary nodule: Secondary | ICD-10-CM | POA: Insufficient documentation

## 2010-12-07 DIAGNOSIS — C3490 Malignant neoplasm of unspecified part of unspecified bronchus or lung: Secondary | ICD-10-CM | POA: Insufficient documentation

## 2010-12-07 DIAGNOSIS — C3410 Malignant neoplasm of upper lobe, unspecified bronchus or lung: Secondary | ICD-10-CM

## 2010-12-07 DIAGNOSIS — R059 Cough, unspecified: Secondary | ICD-10-CM | POA: Insufficient documentation

## 2010-12-07 DIAGNOSIS — R0602 Shortness of breath: Secondary | ICD-10-CM | POA: Insufficient documentation

## 2010-12-07 DIAGNOSIS — R05 Cough: Secondary | ICD-10-CM | POA: Insufficient documentation

## 2010-12-07 LAB — CBC WITH DIFFERENTIAL/PLATELET
BASO%: 0.3 % (ref 0.0–2.0)
Basophils Absolute: 0 10*3/uL (ref 0.0–0.1)
EOS%: 2.9 % (ref 0.0–7.0)
HGB: 14.5 g/dL (ref 11.6–15.9)
MCH: 29.6 pg (ref 25.1–34.0)
MONO%: 8.4 % (ref 0.0–14.0)
NEUT#: 5.5 10*3/uL (ref 1.5–6.5)
RBC: 4.9 10*6/uL (ref 3.70–5.45)
RDW: 14.7 % — ABNORMAL HIGH (ref 11.2–14.5)
lymph#: 2.1 10*3/uL (ref 0.9–3.3)

## 2010-12-07 LAB — CMP (CANCER CENTER ONLY)
ALT(SGPT): 29 U/L (ref 10–47)
AST: 24 U/L (ref 11–38)
Albumin: 3.3 g/dL (ref 3.3–5.5)
Alkaline Phosphatase: 131 U/L — ABNORMAL HIGH (ref 26–84)
BUN, Bld: 21 mg/dL (ref 7–22)
Calcium: 8.6 mg/dL (ref 8.0–10.3)
Chloride: 101 mEq/L (ref 98–108)
Potassium: 4.4 mEq/L (ref 3.3–4.7)
Sodium: 143 mEq/L (ref 128–145)
Total Protein: 6.9 g/dL (ref 6.4–8.1)

## 2010-12-07 MED ORDER — IOHEXOL 300 MG/ML  SOLN
80.0000 mL | Freq: Once | INTRAMUSCULAR | Status: AC | PRN
  Administered 2010-12-07: 80 mL via INTRAVENOUS

## 2010-12-10 ENCOUNTER — Other Ambulatory Visit: Payer: Self-pay | Admitting: Cardiology

## 2010-12-10 ENCOUNTER — Other Ambulatory Visit: Payer: Self-pay | Admitting: Oncology

## 2010-12-10 ENCOUNTER — Encounter (HOSPITAL_BASED_OUTPATIENT_CLINIC_OR_DEPARTMENT_OTHER): Payer: Medicare Other | Admitting: Oncology

## 2010-12-10 DIAGNOSIS — R042 Hemoptysis: Secondary | ICD-10-CM

## 2010-12-10 DIAGNOSIS — C3490 Malignant neoplasm of unspecified part of unspecified bronchus or lung: Secondary | ICD-10-CM

## 2010-12-10 DIAGNOSIS — Z85118 Personal history of other malignant neoplasm of bronchus and lung: Secondary | ICD-10-CM

## 2010-12-10 NOTE — Telephone Encounter (Signed)
escribe request  

## 2010-12-17 ENCOUNTER — Telehealth (INDEPENDENT_AMBULATORY_CARE_PROVIDER_SITE_OTHER): Payer: Self-pay | Admitting: Emergency Medicine

## 2010-12-18 ENCOUNTER — Telehealth (INDEPENDENT_AMBULATORY_CARE_PROVIDER_SITE_OTHER): Payer: Self-pay | Admitting: Emergency Medicine

## 2010-12-26 ENCOUNTER — Encounter: Payer: Self-pay | Admitting: Family Medicine

## 2010-12-26 ENCOUNTER — Inpatient Hospital Stay (INDEPENDENT_AMBULATORY_CARE_PROVIDER_SITE_OTHER)
Admission: RE | Admit: 2010-12-26 | Discharge: 2010-12-26 | Disposition: A | Discharge status: Home / Self Care | Payer: Medicare Other | Source: Ambulatory Visit | Attending: Family Medicine | Admitting: Family Medicine

## 2010-12-26 DIAGNOSIS — B86 Scabies: Secondary | ICD-10-CM | POA: Insufficient documentation

## 2010-12-26 DIAGNOSIS — J45909 Unspecified asthma, uncomplicated: Secondary | ICD-10-CM | POA: Insufficient documentation

## 2011-01-22 ENCOUNTER — Inpatient Hospital Stay (INDEPENDENT_AMBULATORY_CARE_PROVIDER_SITE_OTHER): Admission: RE | Admit: 2011-01-22 | Payer: Medicare Other | Source: Ambulatory Visit | Admitting: Family Medicine

## 2011-01-22 ENCOUNTER — Telehealth (INDEPENDENT_AMBULATORY_CARE_PROVIDER_SITE_OTHER): Payer: Self-pay | Admitting: *Deleted

## 2011-02-06 ENCOUNTER — Other Ambulatory Visit: Payer: Self-pay | Admitting: Cardiology

## 2011-03-29 NOTE — Telephone Encounter (Signed)
  Phone Note Outgoing Call   Call placed by: Emilio Math,  December 18, 2010 2:52 PM Call placed to: Specialist Summary of Call: Set appt with Dr Terri Piedra 9am Monday August 27, called patient.

## 2011-03-29 NOTE — Telephone Encounter (Signed)
  Phone Note Other Incoming   Summary of Call: 12/17/10 1720- pt came by the clinic today and states that her rash is no better. she called and sch'ed an appt with a dermatolgist for 01/04/11. she does not feel like she can wait until that appt, dr lupton's office advised her to have dr Cathren Harsh or nurse to call and they may be able to move up the appt. Advised the pt that we would call them tomorrow and request an earlier appt for her. call the pt at [phone] Set appt with Dr Terri Piedra for [facility] Aug 27, 9 am, called patient. Initial call taken by: Clemens Catholic LPN,  December 17, 2010 5:28 PM

## 2011-03-29 NOTE — Progress Notes (Signed)
Summary: SCABIES (room 2)   Vital Signs:  Patient Profile:   75 Years Old Female CC:      scabies Height:     61 inches (154.94 cm) Weight:      175 pounds (79.55 kg) O2 Sat:      95 % O2 treatment:    Room Air Temp:     98.1 degrees F (36.72 degrees C) oral Pulse rate:   74 / minute Resp:     16 per minute BP sitting:   116 / 68  (left arm) Cuff size:   regular  Pt. in pain?   no  Vitals Entered By: Lavell Islam RN (December 26, 2010 12:24 PM)                   Updated Prior Medication List: * ASA  PERMETHRIN 5 % CREA (PERMETHRIN) Massage into skin from head to toe, leave on 8-14 hours, then wash off.  May repeat in 14 days. TRIAMCINOLONE ACETONIDE 0.1 % CREA (TRIAMCINOLONE ACETONIDE) Apply thin layer to irritated area bid * KCL 20 MG daily * LOSTARTON 100MG  daily FUROSEMIDE 40 MG TABS (FUROSEMIDE) daily * METOPROLOL TARTRATE 25 MG/ML SOLN (METOPROLOL TARTRATE) daily * CALCIUM 600MG / FISH OIL daily  Current Allergies (reviewed today): ! SULFAHistory of Present Illness Chief Complaint: scabies History of Present Illness:  Subjective:  Patient complains of recurrent scabies.  Her sister has similar symptoms, although they do not live together.  She was recently treated with a partial dose of Ivermectin, and symptoms improved but recurred.  REVIEW OF SYSTEMS Constitutional Symptoms      Denies fever, chills, night sweats, weight loss, weight gain, and fatigue.  Eyes       Complains of eye surgery.      Denies change in vision, eye pain, eye discharge, glasses, and contact lenses. Ear/Nose/Throat/Mouth       Complains of sinus problems.      Denies hearing loss/aids, change in hearing, ear pain, ear discharge, dizziness, frequent runny nose, frequent nose bleeds, sore throat, and tooth pain or bleeding.  Respiratory       Complains of dry cough, wheezing, shortness of breath, and asthma.      Denies productive cough, bronchitis, and emphysema/COPD.   Cardiovascular       Denies murmurs, chest pain, and tires easily with exhertion.    Gastrointestinal       Denies stomach pain, nausea/vomiting, diarrhea, constipation, blood in bowel movements, and indigestion. Genitourniary       Denies painful urination, kidney stones, and loss of urinary control. Neurological       Denies paralysis, seizures, and fainting/blackouts. Musculoskeletal       Denies muscle pain, joint pain, joint stiffness, decreased range of motion, redness, swelling, muscle weakness, and gout.  Skin       Complains of bruising.      Denies unusual mles/lumps or sores and hair/skin or nail changes.  Psych       Denies mood changes, temper/anger issues, anxiety/stress, speech problems, depression, and sleep problems. Other Comments: scabies   Past History:  Past Medical History: Coronary artery disease Hypertension lung cancer skin cancer Asthma  Past Surgical History: Reviewed history from 11/19/2010 and no changes required. Cholecystectomy Hysterectomy Tonsillectomy knee surgery lung surgery foot surgery bladder sling  Family History: Reviewed history from 11/19/2010 and no changes required. Family History of CAD Family History Diabetes 1st degree relative Family History Hypertension  Social History: Reviewed history from  11/19/2010 and no changes required. Never Smoked Alcohol use-no Drug use-no no pets   Objective:  Appearance:  Patient appears healthy, stated age, and in no acute distress  Skin:  scattered maculo-papular erythematous excoriations on trunk and extremities.  Lesions do not appear infected. Assessment New Problems: SCABIES (ICD-133.0) ASTHMA (ICD-493.90)   Plan New Medications/Changes: STROMECTOL 3 MG TABS (IVERMECTIN) Take 6 tabs by mouth as a single dose.  Take on empty stomach with plenty of water.  Repeat in two weeks.  #12 x 0, 12/26/2010, Donna Christen MD  New Orders: Est. Patient Level III [phone] Services  provided After hours-Weekends-Holidays [[id number]] Planning Comments:   Treat with Ivermectin as a single dose.  Repeat in 2 weeks.  May continue triamcinolone cream to individual lesions.  Wash all clothes and bedsheets.  Will treat sister also.  Follow-up with dermatologist.   The patient and/or caregiver has been counseled thoroughly with regard to medications prescribed including dosage, schedule, interactions, rationale for use, and possible side effects and they verbalize understanding.  Diagnoses and expected course of recovery discussed and will return if not improved as expected or if the condition worsens. Patient and/or caregiver verbalized understanding.  Prescriptions: STROMECTOL 3 MG TABS (IVERMECTIN) Take 6 tabs by mouth as a single dose.  Take on empty stomach with plenty of water.  Repeat in two weeks.  #12 x 0   Entered and Authorized by:   Donna Christen MD   Signed by:   Donna Christen MD on 12/26/2010   Method used:   Print then Give to Patient   RxID:   [id number]   Orders Added: 1)  Est. Patient Level III [29528] 2)  Services provided After hours-Weekends-Holidays [[id number]]

## 2011-03-29 NOTE — Telephone Encounter (Signed)
  Phone Note Outgoing Call   Summary of Call: pt came by the clinic today c/o rash again. explained to the pt per dr Cathren Harsh that she needs to follow up with dermatologist. sch'ed an appt with dr Terri Piedra for 01/25/11 @ 12N. pt notified of the appt. she states that she will not be able to make this appt. given phone number to dr lupton's office and advised her to call them to resch appt. advised her if no improvement after visit with dr Terri Piedra dr Cathren Harsh reccomends that she see dermatology @ [facility]. given map and phone number to [facility] derm. Initial call taken by: Clemens Catholic LPN,  January 22, 2011 12:27 PM

## 2011-03-29 NOTE — Progress Notes (Signed)
Summary: BITES ON SKIN AND SCALP (ROOM 5)   Vital Signs:  Patient Profile:   75 Years Old Female CC:      bites on skin/head x 2 weeks Height:     61 inches Weight:      177 pounds O2 Sat:      94 % O2 treatment:    Room Air Temp:     98.2 degrees F oral Pulse rate:   79 / minute Resp:     16 per minute BP sitting:   130 / 78  (left arm) Cuff size:   regular  Pt. in pain?   no  Vitals Entered By: Lavell Islam RN (November 19, 2010 5:26 PM)                   Current Allergies: ! SULFAHistory of Present Illness Chief Complaint: bites on skin/head x 2 weeks History of Present Illness:  Subjective:  Patient complains of 2 week history of recurring bites on scalp and body.  She treated her scalp with both OTC pyrethrin and RX lindane with resolution of scalp symptoms.  She complains of continued bites on her body.  She actually feels the bites when they occur, and then develops a small red bump that resolves spontaneously.  Lesions do not itch.  No fevers, chills, and sweats.  She feels well otherwise.  Denies tick bites (no significant outdoor exposure).  No household pets.  She has checked for bedbugs in house.  REVIEW OF SYSTEMS Constitutional Symptoms       Complains of weight gain.     Denies fever, chills, night sweats, weight loss, and fatigue.  Eyes       Denies change in vision, eye pain, eye discharge, glasses, contact lenses, and eye surgery. Ear/Nose/Throat/Mouth       Denies hearing loss/aids, change in hearing, ear pain, ear discharge, dizziness, frequent runny nose, frequent nose bleeds, sinus problems, sore throat, hoarseness, and tooth pain or bleeding.  Respiratory       Complains of asthma.      Denies dry cough, productive cough, wheezing, shortness of breath, bronchitis, and emphysema/COPD.  Cardiovascular       Denies murmurs, chest pain, and tires easily with exhertion.    Gastrointestinal       Denies stomach pain, nausea/vomiting, diarrhea,  constipation, blood in bowel movements, and indigestion. Genitourniary       Denies painful urination, kidney stones, and loss of urinary control. Neurological       Denies paralysis, seizures, and fainting/blackouts. Musculoskeletal       Denies muscle pain, joint pain, joint stiffness, decreased range of motion, redness, swelling, muscle weakness, and gout.  Skin       Denies bruising, unusual mles/lumps or sores, and hair/skin or nail changes.      Comments: scalp bites Psych       Denies mood changes, temper/anger issues, anxiety/stress, speech problems, depression, and sleep problems. Other Comments: scalp and skin bites x 2 weeks   Past History:  Family History: Last updated: 11/19/2010 Family History of CAD Family History Diabetes 1st degree relative Family History Hypertension  Social History: Last updated: 11/19/2010 Never Smoked Alcohol use-no Drug use-no no pets  Past Medical History: Coronary artery disease Hypertension lung cancer skin cancer  Past Surgical History: Cholecystectomy Hysterectomy Tonsillectomy knee surgery lung surgery foot surgery bladder sling  Family History: Family History of CAD Family History Diabetes 1st degree relative Family History Hypertension  Social History:  Never Smoked Alcohol use-no Drug use-no no petsSmoking Status:  never Drug Use:  no   Objective:  Appearance:  Patient appears healthy, stated age, and in no acute distress  Scalp:  No nits or lice observed.  No scalp lesions. Skin:  There are only a few widely scattered small erythematous healing excoriations (about 2 to 5mm dia) on trunk and extremities, with no specific pattern.  No evidence cellulitis.  No linear lesions.  No involvement of palms. Assessment New Problems: DERMATITIS (ICD-692.9) FAMILY HISTORY DIABETES 1ST DEGREE RELATIVE (ICD-V18.0) FAMILY HISTORY OF CAD FEMALE 1ST DEGREE RELATIVE <60 (ICD-V16.49) HYPERTENSION (ICD-401.9) CORONARY  ARTERY DISEASE (ICD-414.00)  ? BODY LICE.  RASH NOT TYPICAL FOR SCABIES  Plan New Medications/Changes: TRIAMCINOLONE ACETONIDE 0.1 % CREA (TRIAMCINOLONE ACETONIDE) Apply thin layer to irritated area bid  #30gm x 0, 11/19/2010, Donna Christen MD PERMETHRIN 5 % CREA (PERMETHRIN) Massage into skin from head to toe, leave on 8-14 hours, then wash off.  May repeat in 14 days.  #60gm x 1, 11/19/2010, Donna Christen MD POTASSIUM  #QS x 0, 11/19/2010, Donna Christen MD  New Orders: New Patient Level III [phone] Planning Comments:   Will empirically treat with permethrin cream:  Apply at bedtime and wash off next morning.  Repeat in one week.  Rx for triamcinolone cream for individual lesions.   Wash all clothes and bedclothes.  Info sheets given. If no improvement, recommend follow-up with dermatologist.   The patient and/or caregiver has been counseled thoroughly with regard to medications prescribed including dosage, schedule, interactions, rationale for use, and possible side effects and they verbalize understanding.  Diagnoses and expected course of recovery discussed and will return if not improved as expected or if the condition worsens. Patient and/or caregiver verbalized understanding.  Prescriptions: TRIAMCINOLONE ACETONIDE 0.1 % CREA (TRIAMCINOLONE ACETONIDE) Apply thin layer to irritated area bid  #30gm x 0   Entered and Authorized by:   Donna Christen MD   Signed by:   Donna Christen MD on 11/19/2010   Method used:   Print then Give to Patient   RxID:   [id number] PERMETHRIN 5 % CREA (PERMETHRIN) Massage into skin from head to toe, leave on 8-14 hours, then wash off.  May repeat in 14 days.  #60gm x 1   Entered and Authorized by:   Donna Christen MD   Signed by:   Donna Christen MD on 11/19/2010   Method used:   Print then Give to Patient   RxID:   [id number] POTASSIUM   #QS x 0   Entered by:   Lavell Islam RN   Authorized by:   Donna Christen MD   Signed by:   Donna Christen MD on 11/19/2010   Method used:   Print then Give to Patient   RxID:   [id number]   Orders Added: 1)  New Patient Level III [25366]

## 2011-05-21 IMAGING — CR DG CHEST 1V PORT
1 series · 1 of 1 positions shown · non-contrast
Comparison: None

CLINICAL DATA: Chest pain

PORTABLE CHEST - 1 VIEW

[view not recorded]
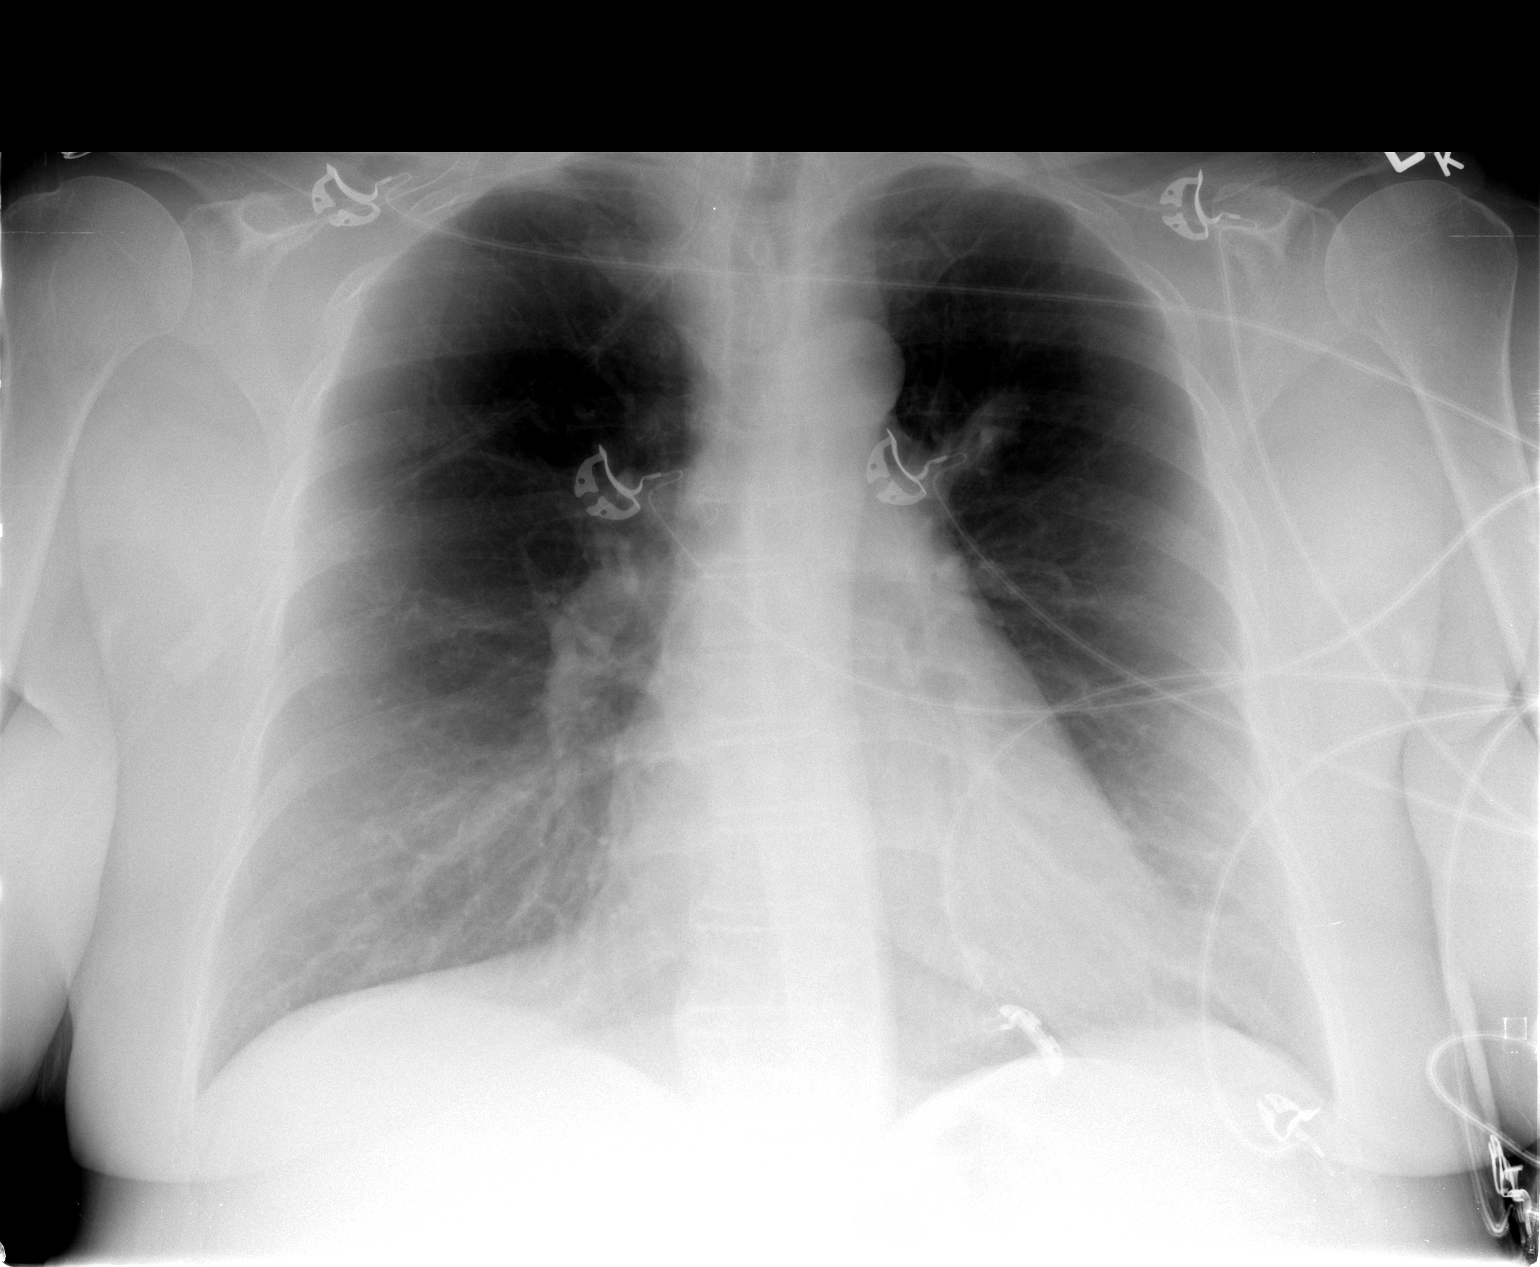

[1 of 1 positions shown; findings below may reference images not displayed]

FINDINGS: No edema or infiltrates.  The heart size is normal.
There is probable component of COPD.
IMPRESSION: No active disease.

## 2011-05-22 IMAGING — CR DG CHEST 1V
1 series · 1 of 1 positions shown · non-contrast
Comparison: CT of the chest of 01/08/2009

CLINICAL DATA: Status post left upper lobe lung biopsy

CHEST - 1 VIEW

[view not recorded]
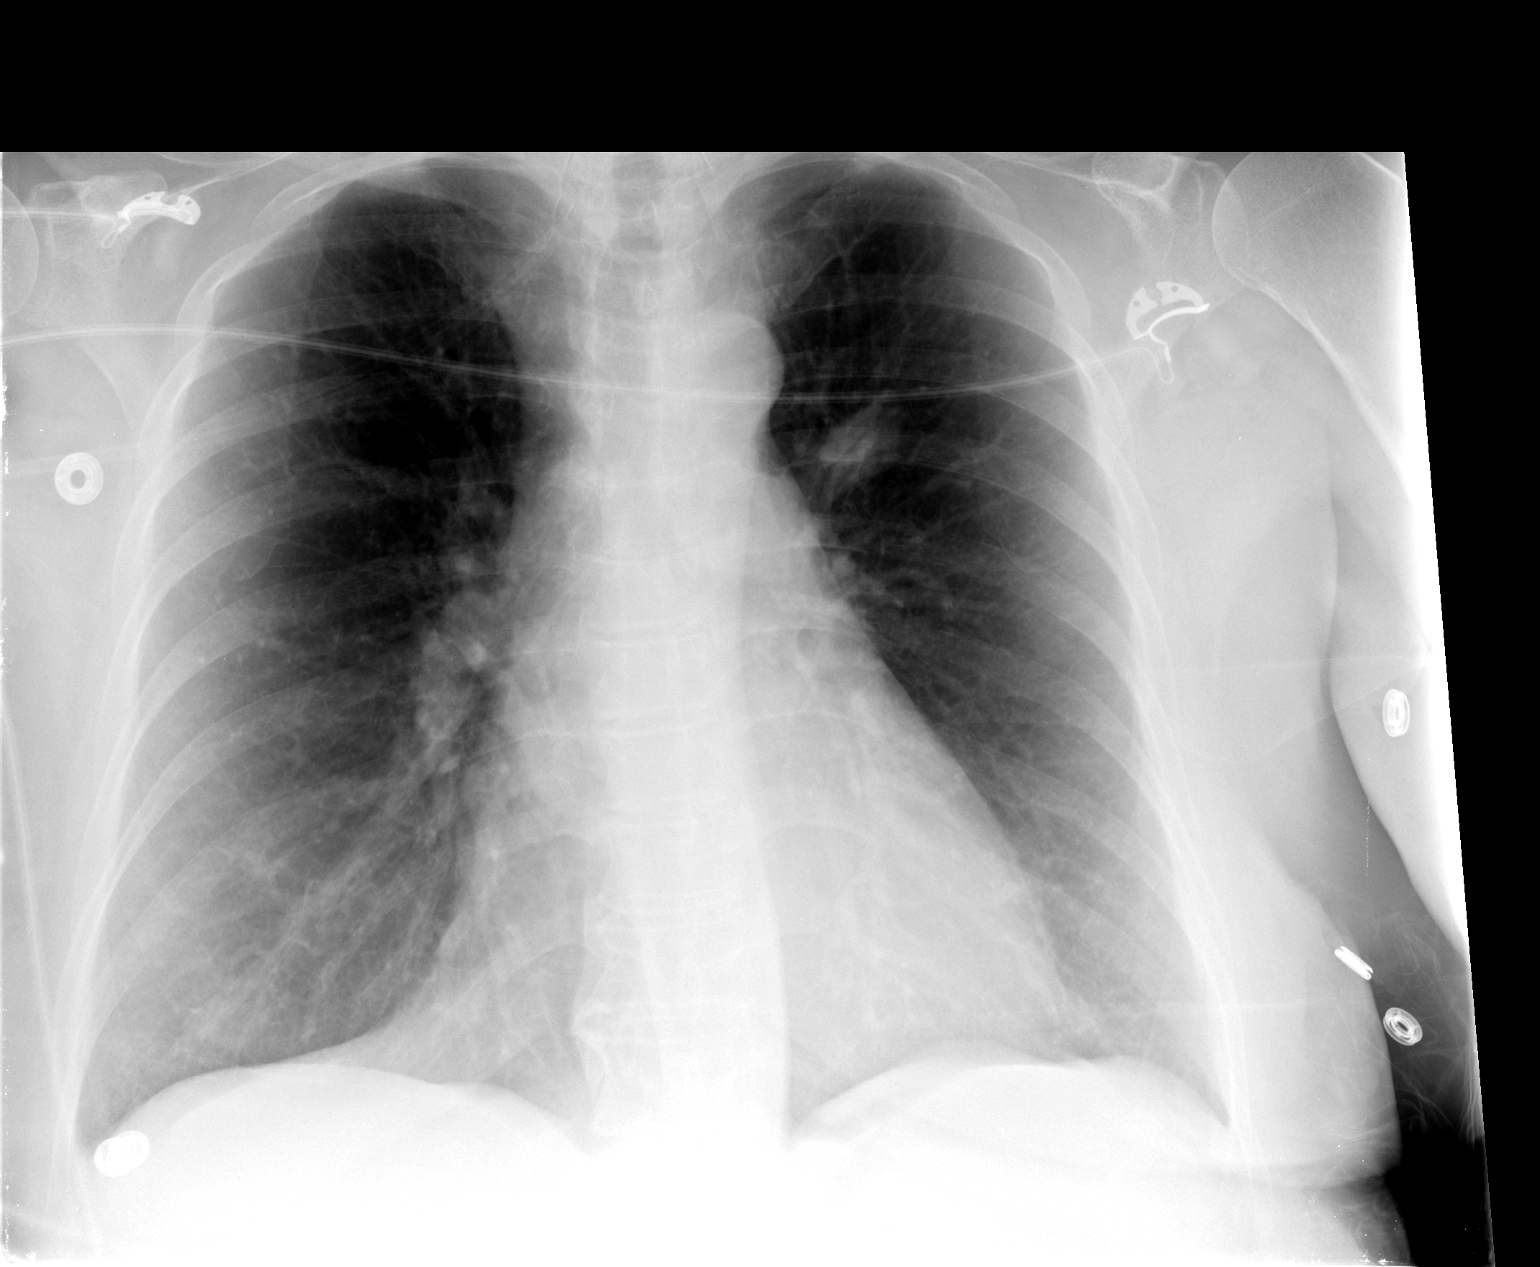

[1 of 1 positions shown; findings below may reference images not displayed]

FINDINGS: The left upper lobe nodular lesion is again noted.  No
pneumothorax is seen post lung biopsy.  No effusion is seen.  The
heart is within upper limits of normal.
IMPRESSION: No pneumothorax after biopsy of left upper lobe lung nodule.

## 2011-05-23 IMAGING — US US ABDOMEN COMPLETE
1 series · 14 of 25 positions shown · non-contrast
Comparison: 02/07/2009

CLINICAL DATA: Right upper quadrant pain

COMPLETE ABDOMINAL ULTRASOUND

[Series 1: us abdomen complete · 0.32mm/px · 14 of 55 slices shown]
[im 1/55]
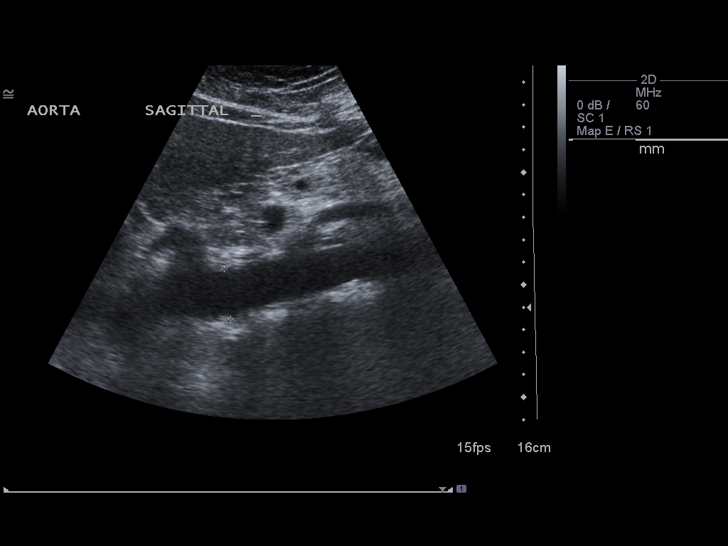
[im 5/55]
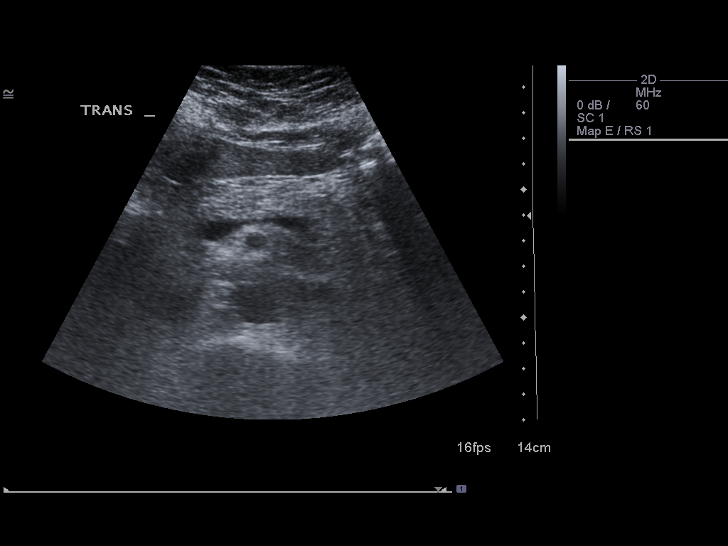
[im 10/55]
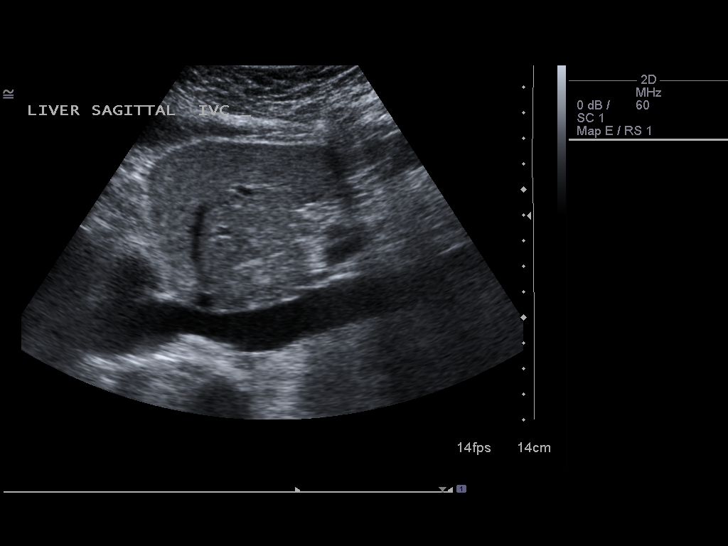
[im 14/55]
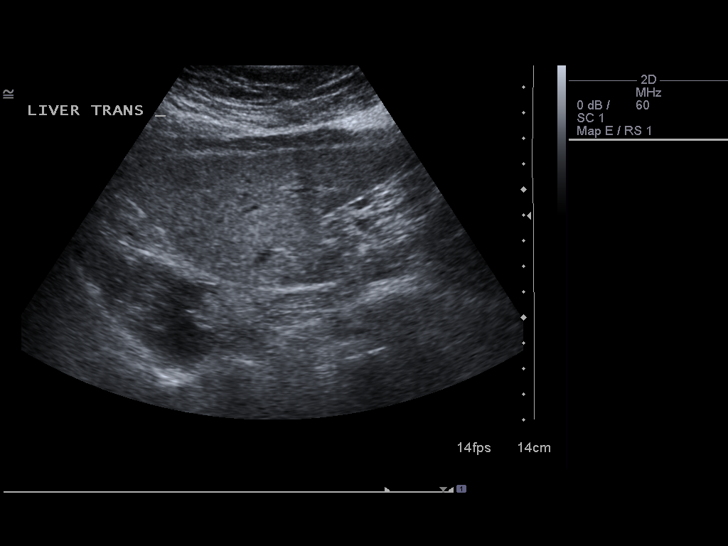
[im 19/55]
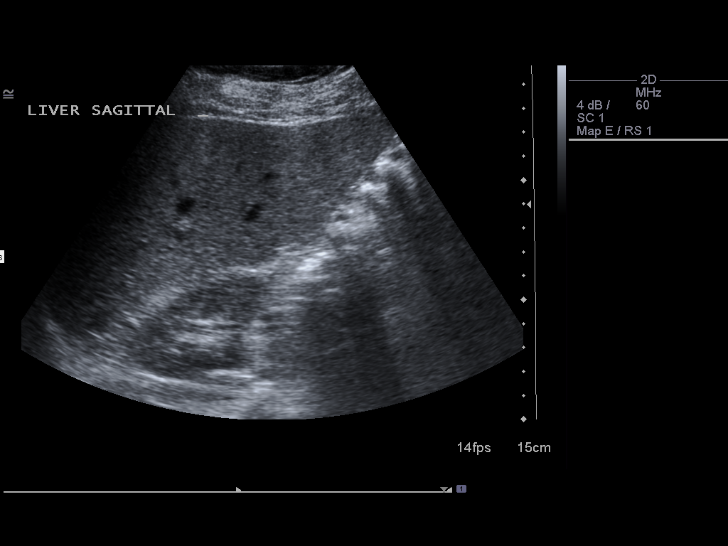
[im 21/55]
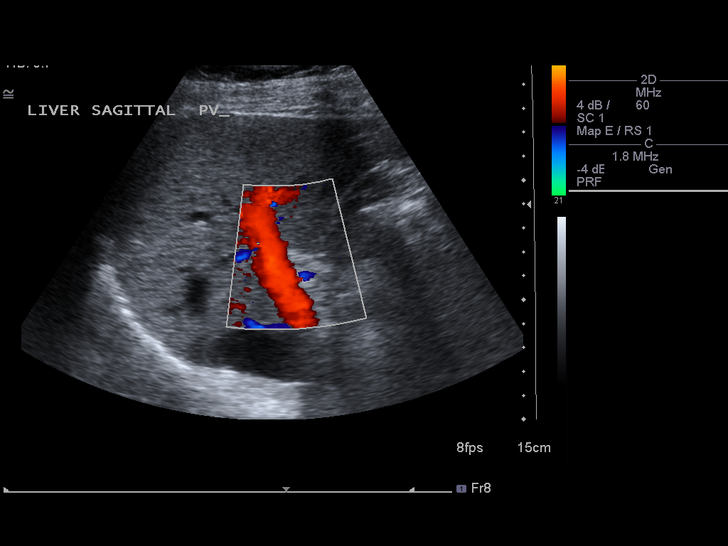
[im 25/55]
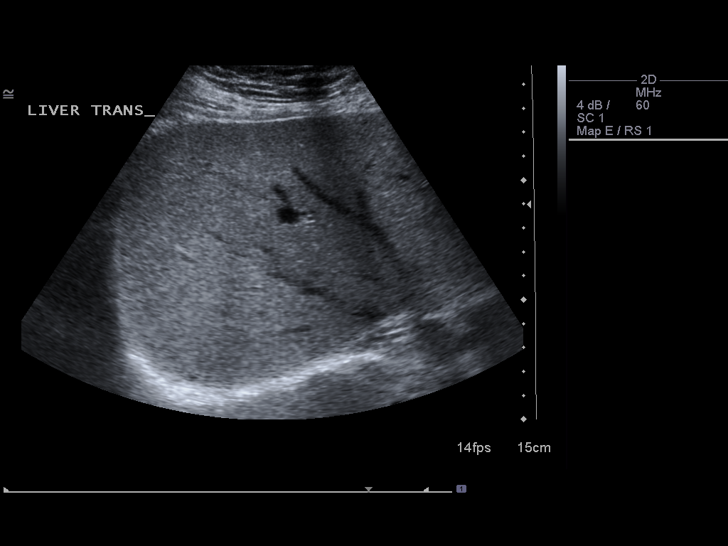
[im 30/55]
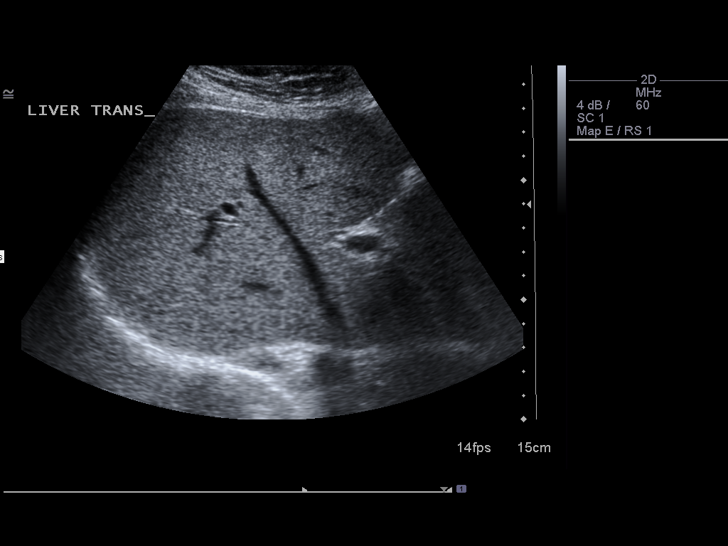
[im 34/55]
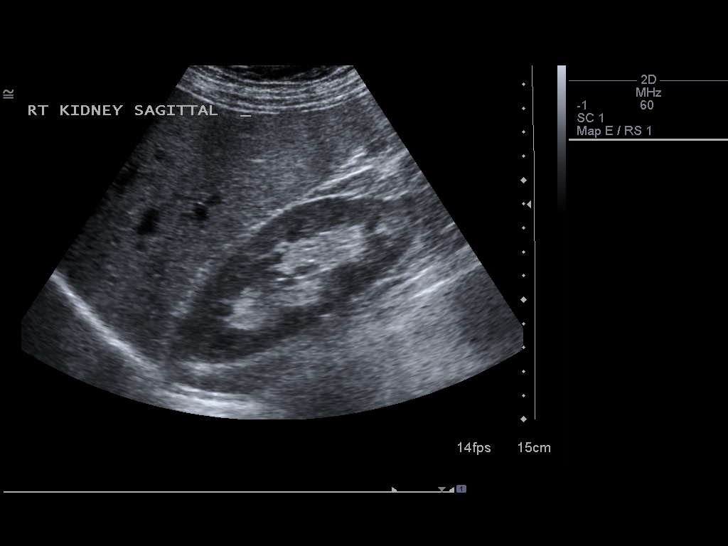
[im 37/55]
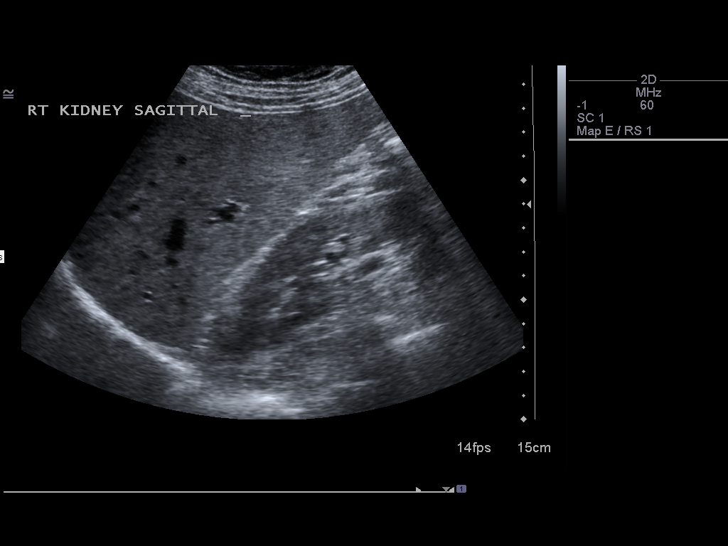
[im 41/55]
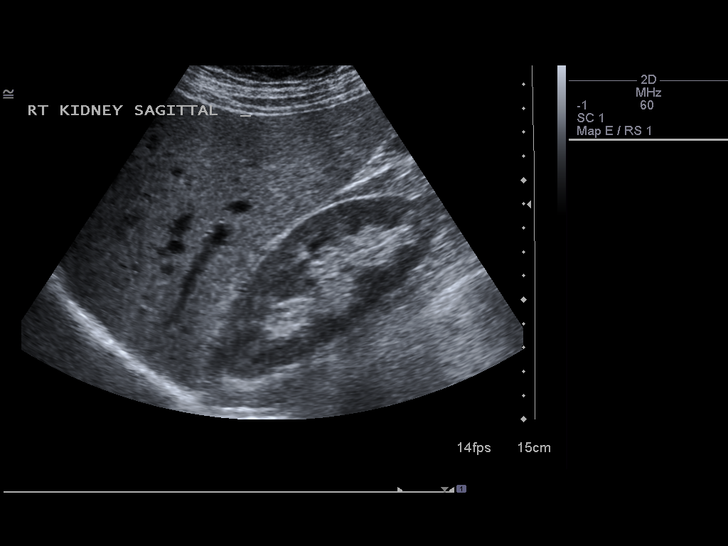
[im 46/55]
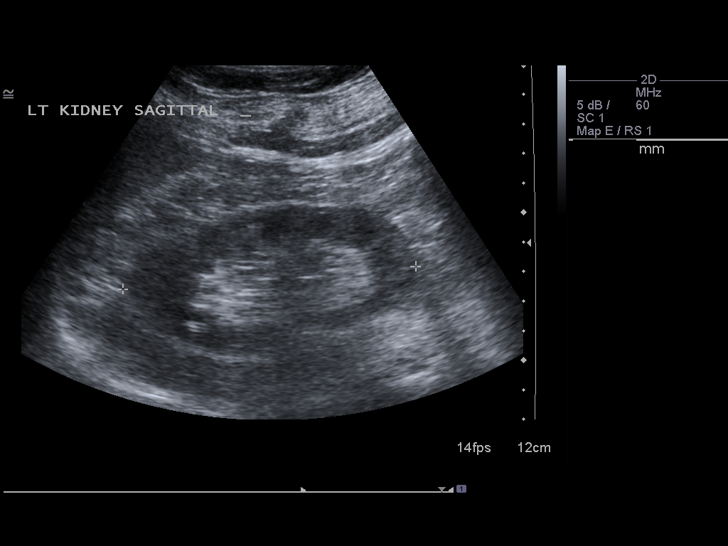
[im 50/55]
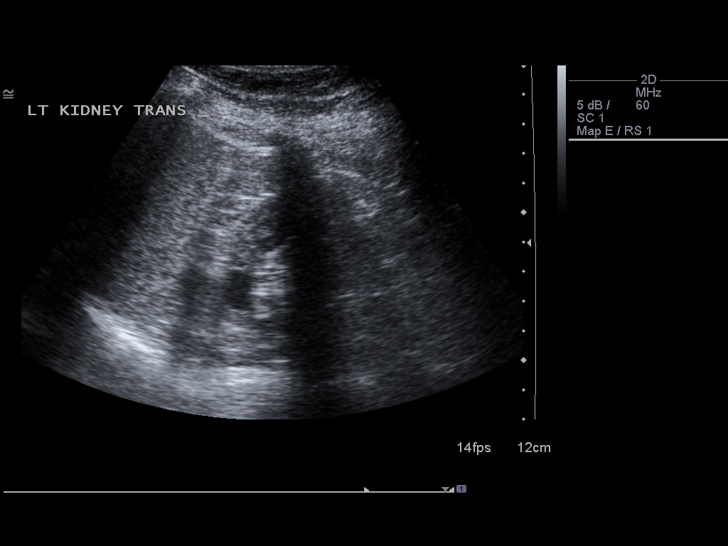
[im 55/55]
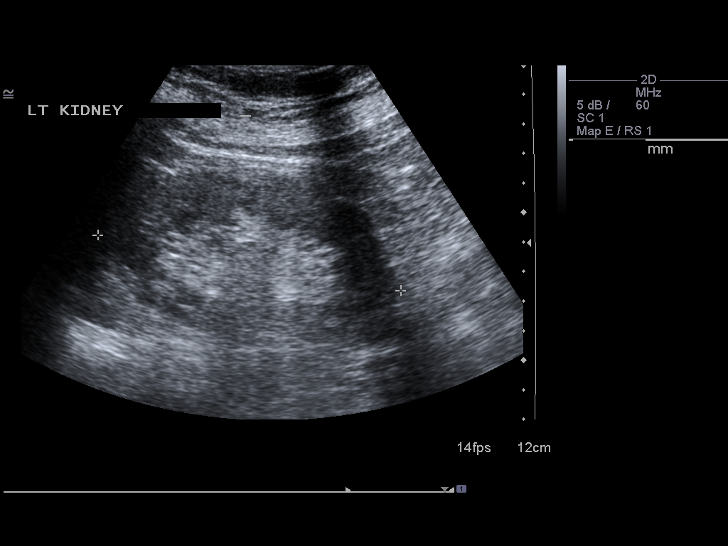

[14 of 25 positions shown; findings below may reference images not displayed]

FINDINGS: Gallbladder:  Surgically absent

Common bile duct:  Within normal limits measures 5.5 mm in diameter

Liver:  No focal lesion identified.  Within normal limits in
parenchymal echogenicity.

IVC:  Appears normal.

Pancreas:  No focal abnormality seen.

Spleen:  Within normal limits in size and echotexture. The spleen
measures 6.2 cm in length.

Right Kidney:  Normal in size and parenchymal echogenicity.  No
mass or hydronephrosis. Right kidney measures 12.2 cm in length.

Left Kidney:  Normal in size and parenchymal echogenicity.  No mass
or hydronephrosis. Left kidney measures 10.4 cm in length.  A upper
pole cyst is noted measures 1.5 x 1.5 cm.

Abdominal aorta:  Measures up to 2.2 cm in diameter.  No aortic
aneurysm.
IMPRESSION: Negative abdominal ultrasound. Surgically absent gallbladder.
Normal CBD.

## 2011-06-04 ENCOUNTER — Emergency Department: Admission: EM | Admit: 2011-06-04 | Discharge: 2011-06-04 | Payer: Medicare Other | Source: Home / Self Care

## 2011-06-24 ENCOUNTER — Telehealth: Payer: Self-pay | Admitting: Cardiology

## 2011-06-24 ENCOUNTER — Emergency Department (INDEPENDENT_AMBULATORY_CARE_PROVIDER_SITE_OTHER)
Admission: EM | Admit: 2011-06-24 | Discharge: 2011-06-24 | Disposition: A | Discharge status: Home Health | Payer: Medicare Other | Source: Home / Self Care | Attending: Emergency Medicine | Admitting: Emergency Medicine

## 2011-06-24 ENCOUNTER — Encounter: Payer: Self-pay | Admitting: *Deleted

## 2011-06-24 DIAGNOSIS — M79603 Pain in arm, unspecified: Secondary | ICD-10-CM

## 2011-06-24 DIAGNOSIS — M79609 Pain in unspecified limb: Secondary | ICD-10-CM

## 2011-06-24 MED ORDER — NAPROXEN 500 MG PO TABS
500.0000 mg | ORAL_TABLET | Freq: Two times a day (BID) | ORAL | Status: DC | PRN
Start: 2011-06-24 — End: 2011-12-01

## 2011-06-24 NOTE — Telephone Encounter (Signed)
Received call from Brandy Cameron at [place] office and patient is there with left arm pain.  Spoke with Deliah Goody RN for Dr Jens Som, she advised that she go to the Urgent Care upstairs.  Did advise Kim at the [place] office

## 2011-06-24 NOTE — Telephone Encounter (Signed)
Pt having pain in left arm

## 2011-06-24 NOTE — ED Provider Notes (Signed)
History     CSN: [id number]  Arrival date & time 06/24/11  1622   None     Chief Complaint  Patient presents with  . Arm Pain    left upper/post. arm    (Consider location/radiation/quality/duration/timing/severity/associated sxs/prior treatment) HPI This patient complains of left upper posterior arm pain for 2 days. She describes it as sharp but not radiating.  It is a constant pain and never goes away. It is mild. No chest pain, shortness of breath jaw pain or nausea. She does not recall any cause of the pain, no injuries or falls. She has not been taking any medications or using any modalities. Other than the pain on the back of her left arm she states she's feeling fine. She does have a history of having a heart attack and followed a cardiologist.  Past Medical History  Diagnosis Date  . NSTEMI (non-ST elevated myocardial infarction)     with only mild to moderate CAD in September of 2011,There was concern for apival ballooning. She had hyperdynamic LV function.  Marland Kitchen HTN (hypertension)   . COPD (chronic obstructive pulmonary disease)   . Tobacco abuse   . Lung cancer     [name]    Past Surgical History  Procedure Date  . Knee surgery   . Vesicovaginal fistula closure w/ tah   . Gallbladder surgery   . Cataract surgery   . Foot surgery     Family History  Problem Relation Age of Onset  . Heart disease      POSITIVE FAMILY HX. OF   . Pancreatitis      QUESTIONABLE FROM ACE INHIBITORS  . Heart disease Mother 72  . Asthma Mother   . Diabetes Mother   . Cancer Mother     History  Substance Use Topics  . Smoking status: Former Games developer  . Smokeless tobacco: Not on file   Comment: She stopped smoking four years ago.  . Alcohol Use: No    OB History    Grav Para Term Preterm Abortions TAB SAB Ect Mult Living                  Review of Systems  Allergies  Sulfa antibiotics  and Sulfonamide derivatives  Home Medications   Current Outpatient Rx  Name Route Sig Dispense Refill  . ASPIRIN 81 MG PO TABS Oral Take 81 mg by mouth daily.      . FUROSEMIDE 40 MG PO TABS Oral Take 40 mg by mouth daily.      Marland Kitchen LOSARTAN POTASSIUM 100 MG PO TABS Oral Take 1 tablet (100 mg total) by mouth daily. 30 tablet 12  . METOPROLOL TARTRATE 25 MG PO TABS  TAKE 1 TABLET BY MOUTH TWICE DAILY 60 tablet 5  . NITROSTAT 0.4 MG SL SUBL  DISSOLVE 1 TABLET UNDER THE TONGUE EVERY 5 MINUTES AS NEEDED UP TO 3 DOSES 25 tablet 0  . POTASSIUM CHLORIDE CRYS ER 20 MEQ PO TBCR Oral Take 20 mEq by mouth 2 (two) times daily.      Marland Kitchen SIMVASTATIN 40 MG PO TABS Oral Take 1 tablet (40 mg total) by mouth at bedtime. 30 tablet 12    BP 132/84  Pulse 100  Temp(Src) 98.2 F (36.8 C) (Oral)  Resp 14  SpO2 94%  Physical Exam  Nursing note and vitals reviewed. Constitutional: She is oriented to person, place, and  time. She appears well-developed and well-nourished.  HENT:  Head: Normocephalic and atraumatic.  Eyes: No scleral icterus.  Neck: Neck supple.  Cardiovascular: Normal rate, regular rhythm and normal heart sounds.   Pulmonary/Chest: Effort normal and breath sounds normal. No respiratory distress.  Musculoskeletal:       Left shoulder and arm pain demonstrate full range of motion.  minimal tenderness in the triceps area in her distal deltoid. Distal neurovascular status is intact.   Neurological: She is alert and oriented to person, place, and time.  Skin: Skin is warm and dry.  Psychiatric: She has a normal mood and affect. Her speech is normal.    ED Course  Procedures (including critical care time)  Labs Reviewed - No data to display No results found.   No diagnosis found.    MDM   Due to her history, a EKG was performed. Is unchanged from previous EKGs in the computer. Despite her cardiac risk factors, I really do not believe there is a cardiac event. She's not having any chest  pain or shortness of breath or any red flags concerning with a cardiac etiology. I will give her prescription for naproxen for triceps strain and have her call her cardiologist in the morning and make an appointment. I've given her ER precautions that if she the pain increases at all overnight or she has any new symptoms, then she's got emergency room or call EMS immediately.  The patient understands and agrees to this.     Lily Kocher, MD 06/24/11 [phone]

## 2011-06-24 NOTE — ED Notes (Signed)
Patient c/o left upper posterior arm pain x 2 days. Pain is described as sharp, it does not radiate. She does not recall a cause of the pain. Denies CP, SOB, jaw pain or nausea.

## 2011-06-24 NOTE — Discharge Instructions (Signed)
Heart Attack in Women Heart attack (myocardial infarction) is one of the leading causes of sudden, unexpected death in women. Early recognition of heart attack symptoms is critical. Do not ignore heart attack symptoms. If you experience heart attack symptoms, get immediate help. Early treatment helps reduce heart damage. CAUSES  A heart attack happens because the heart (coronary) arteries become blocked by fatty deposits (plaque) or blood clots. This reduces the oxygen and blood supply to the heart. When one or more of the heart arteries becomes blocked, that area of the heart will begin to die, causing the pain felt during a heart attack.  RISK FACTORS In women, as the level of estrogen in the blood decreases after menopause, the risk of heart attack increases. Other risk factors of heart attack in women include:  High blood pressure.   High cholesterol levels.   Diabetes.   Smoking.   Obesity.   Menopause.   Hysterectomy.   Previous heart attack.   Lack of regular exercise.   Family history of heart attacks.  SYMPTOMS  In women, heart attack symptoms may be different than those in men. Women may not experience the typical chest discomfort or pain, which is considered the primary heart attack symptom in men. Women may describe a feeling of pressure, ache, or tightness in the chest. Women may experience new or different physical symptoms sometimes a month or more before a heart attack. Unusual, unexplained fatigue may be the most frequently identified symptom. Sleep disturbances and weakness in the arms may also be considered warning signs.  Other heart attack symptoms that may occur more often in women are:  Unexplained feelings of nervousness or anxiety.   Discomfort between the shoulder blades.   Tingling in the hands and arms.   Swollen arms.   Headaches.  Heart attack symptoms for both men and women include:  Pain or discomfort spreading to the neck, shoulder, arm, or  jaw.   Shortness of breath.   Sudden cold sweats.   Pain or discomfort in the abdomen.   Heartburn or indigestion with or without vomiting.   Sudden lightheadedness.   Sudden fainting or blackout.  PREVENTION The following healthy lifestyle habits may help decrease your risk of heart attacks:  Quitting smoking.   Keeping your blood pressure, blood sugar, and cholesterol levels within normal limits.   Maintaining a healthy weight.   Staying physically active and exercising regularly.   Decreasing your salt intake.   Eating a diet low in saturated fats and cholesterol.   Increasing your fiber intake by including whole grains, vegetables, and fruits in your diet.   Avoiding situations that cause stress, anger, or depression.   Taking medicine as advised by your caregiver.  SEEK IMMEDIATE MEDICAL CARE IF:   You have pain or discomfort in the middle of your chest.   You have pain or discomfort in the upper part of your body, such as the arms, back, neck, jaw, or stomach.   You develop shortness of breath.   You break out into a cold sweat.   You feel nauseous or lightheaded.   You have a fainting episode.   You feel very unusual weakness.   You feel that your heart is pounding very hard or is beating irregularly.  MAKE SURE YOU:   Understand these instructions.   Will watch your condition.   Will get help right away if you are not doing well or get worse.  Document Released: 10/09/2007 Document Revised: 12/23/2010 Document  Reviewed: 10/09/2007 [employer] Patient Information 2012 [place], [place].

## 2011-06-26 ENCOUNTER — Telehealth: Payer: Self-pay

## 2011-10-29 ENCOUNTER — Other Ambulatory Visit: Payer: Self-pay | Admitting: Neurology

## 2011-10-29 DIAGNOSIS — R413 Other amnesia: Secondary | ICD-10-CM

## 2011-10-29 DIAGNOSIS — C3490 Malignant neoplasm of unspecified part of unspecified bronchus or lung: Secondary | ICD-10-CM

## 2011-10-29 DIAGNOSIS — E785 Hyperlipidemia, unspecified: Secondary | ICD-10-CM

## 2011-11-23 DIAGNOSIS — C3490 Malignant neoplasm of unspecified part of unspecified bronchus or lung: Secondary | ICD-10-CM

## 2011-11-29 ENCOUNTER — Telehealth: Payer: Self-pay | Admitting: Oncology

## 2011-11-29 NOTE — Telephone Encounter (Signed)
S/w pt today re appt for 8/21 @ 9:30 am. Also confirmed ct @ 10:30 am.

## 2011-11-30 ENCOUNTER — Other Ambulatory Visit: Payer: Self-pay | Admitting: Cardiology

## 2011-11-30 DIAGNOSIS — E785 Hyperlipidemia, unspecified: Secondary | ICD-10-CM

## 2011-11-30 DIAGNOSIS — I10 Essential (primary) hypertension: Secondary | ICD-10-CM

## 2011-11-30 MED ORDER — SIMVASTATIN 40 MG PO TABS: 40.0000 mg | ORAL_TABLET | Freq: Every day | ORAL | Status: DC

## 2011-11-30 MED ORDER — METOPROLOL TARTRATE 25 MG PO TABS: 25.0000 mg | ORAL_TABLET | Freq: Two times a day (BID) | ORAL | Status: DC

## 2011-11-30 MED ORDER — LOSARTAN POTASSIUM 100 MG PO TABS: 100.0000 mg | ORAL_TABLET | Freq: Every day | ORAL | Status: DC

## 2011-12-01 ENCOUNTER — Encounter: Payer: Self-pay | Admitting: Cardiology

## 2011-12-01 ENCOUNTER — Ambulatory Visit (INDEPENDENT_AMBULATORY_CARE_PROVIDER_SITE_OTHER): Payer: Medicare Other | Admitting: Cardiology

## 2011-12-01 ENCOUNTER — Ambulatory Visit
Admission: RE | Admit: 2011-12-01 | Discharge: 2011-12-01 | Disposition: A | Discharge status: Home / Self Care | Payer: Medicare Other | Source: Ambulatory Visit | Attending: Neurology | Admitting: Neurology

## 2011-12-01 VITALS — BP 136/86 | HR 77 | Ht 61.0 in | Wt 190.0 lb

## 2011-12-01 DIAGNOSIS — C3490 Malignant neoplasm of unspecified part of unspecified bronchus or lung: Secondary | ICD-10-CM

## 2011-12-01 DIAGNOSIS — E785 Hyperlipidemia, unspecified: Secondary | ICD-10-CM

## 2011-12-01 DIAGNOSIS — R413 Other amnesia: Secondary | ICD-10-CM

## 2011-12-01 DIAGNOSIS — I10 Essential (primary) hypertension: Secondary | ICD-10-CM

## 2011-12-01 DIAGNOSIS — I2510 Atherosclerotic heart disease of native coronary artery without angina pectoris: Secondary | ICD-10-CM

## 2011-12-01 DIAGNOSIS — R0989 Other specified symptoms and signs involving the circulatory and respiratory systems: Secondary | ICD-10-CM

## 2011-12-01 NOTE — Patient Instructions (Addendum)
Your physician wants you to follow-up in: ONE YEAR WITH DR CRENSHAW IN [place] You will receive a reminder letter in the mail two months in advance. If you don't receive a letter, please call our office to schedule the follow-up appointment.   Your physician has requested that you have a lexiscan myoview. For further information please visit [email or web address]. Please follow instruction sheet, as given.   Your physician has requested that you have a carotid duplex. This test is an ultrasound of the carotid arteries in your neck. It looks at blood flow through these arteries that supply the brain with blood. Allow one hour for this exam. There are no restrictions or special instructions.   

## 2011-12-01 NOTE — Assessment & Plan Note (Signed)
Left carotid bruit. Arrange carotid Dopplers.

## 2011-12-01 NOTE — Progress Notes (Signed)
HPI: Pleasant female for followup of coronary artery disease. Cardiac catheterization in September of 2011 revealed Left main normal. Left circumflex has irregularities. Left anterior descending with a 50% focal stenosis in the midportion of the vessel and scattered irregularities otherwise; 50% stenosis in the proximal right coronary artery, but otherwise no significant focal stenosis is present. Inferior apical akinesis with overall well-preserved left ventricular function in a somewhat hyperdynamic manner. She was last seen in July of 2012. Since then, she has some dyspnea on exertion which is unchanged. She attributes this to COPD and previous resection of lung cancer. She occasionally has bilateral jaw pain. It is not related to exertion. It is relieved with nitroglycerin. She does not have exertional chest pain. She denies pedal edema or syncope.  Current Outpatient Prescriptions  Medication Sig Dispense Refill  . aspirin 81 MG tablet Take 81 mg by mouth daily.        . furosemide (LASIX) 40 MG tablet Take 40 mg by mouth daily.        Marland Kitchen losartan (COZAAR) 100 MG tablet Take 1 tablet (100 mg total) by mouth daily.  30 tablet  12  . metoprolol tartrate (LOPRESSOR) 25 MG tablet Take 1 tablet (25 mg total) by mouth 2 (two) times daily.  60 tablet  5  . NITROSTAT 0.4 MG SL tablet DISSOLVE 1 TABLET UNDER THE TONGUE EVERY 5 MINUTES AS NEEDED UP TO 3 DOSES  25 tablet  0  . potassium chloride SA (K-DUR,KLOR-CON) 20 MEQ tablet Take 20 mEq by mouth daily.       . simvastatin (ZOCOR) 40 MG tablet Take 1 tablet (40 mg total) by mouth at bedtime.  30 tablet  12     Past Medical History  Diagnosis Date  . NSTEMI (non-ST elevated myocardial infarction)     with only mild to moderate CAD in September of 2011,There was concern for apival ballooning. She had hyperdynamic LV function.  Marland Kitchen HTN (hypertension)   . COPD (chronic obstructive pulmonary disease)   . CAD (coronary artery disease)   . Lung cancer       [name]    Past Surgical History  Procedure Date  . Knee surgery   . Vesicovaginal fistula closure w/ tah   . Gallbladder surgery   . Cataract surgery   . Foot surgery     History   Social History  . Marital Status: Divorced    Spouse Name: N/A    Number of Children: N/A  . Years of Education: N/A   Occupational History  . Not on file.   Social History Main Topics  . Smoking status: Former Games developer  . Smokeless tobacco: Not on file   Comment: She stopped smoking four years ago.  . Alcohol Use: No  . Drug Use: Not on file  . Sexually Active: Not on file   Other Topics Concern  . Not on file   Social History Narrative  . No narrative on file    ROS: no fevers or chills, productive cough, hemoptysis, dysphasia, odynophagia, melena, hematochezia, dysuria, hematuria, rash, seizure activity, orthopnea, PND, pedal edema, claudication. Remaining systems are negative.  Physical Exam: Well-developed obese in no acute distress.  Skin is warm and dry.  HEENT is normal.  Neck is supple. Left carotid bruit Chest is clear to auscultation with normal expansion.  Cardiovascular exam is regular rate and rhythm.  Abdominal  exam nontender or distended. No masses palpated. Extremities show no edema. neuro grossly intact  ECG NSR, LAD, no ST changes

## 2011-12-01 NOTE — Assessment & Plan Note (Signed)
Blood pressure controlled. Continue present medications. Potassium and renal function monitored by primary care. 

## 2011-12-01 NOTE — Assessment & Plan Note (Signed)
Continue statin. Lipids and liver monitored by primary care. 

## 2011-12-01 NOTE — Assessment & Plan Note (Signed)
Continue aspirin and statin. I will arrange a Myoview for risk stratification given bilateral jaw pain.

## 2011-12-15 ENCOUNTER — Other Ambulatory Visit: Payer: Self-pay | Admitting: Oncology

## 2011-12-15 ENCOUNTER — Ambulatory Visit (HOSPITAL_COMMUNITY)
Admission: RE | Admit: 2011-12-15 | Discharge: 2011-12-15 | Disposition: A | Discharge status: Home / Self Care | Payer: Medicare Other | Source: Ambulatory Visit | Attending: Oncology | Admitting: Oncology

## 2011-12-15 ENCOUNTER — Other Ambulatory Visit: Payer: Self-pay | Admitting: *Deleted

## 2011-12-15 ENCOUNTER — Encounter (HOSPITAL_COMMUNITY): Payer: Self-pay

## 2011-12-15 ENCOUNTER — Telehealth: Payer: Self-pay | Admitting: Oncology

## 2011-12-15 ENCOUNTER — Other Ambulatory Visit (HOSPITAL_BASED_OUTPATIENT_CLINIC_OR_DEPARTMENT_OTHER): Payer: Medicare Other

## 2011-12-15 DIAGNOSIS — C3410 Malignant neoplasm of upper lobe, unspecified bronchus or lung: Secondary | ICD-10-CM

## 2011-12-15 DIAGNOSIS — J984 Other disorders of lung: Secondary | ICD-10-CM | POA: Insufficient documentation

## 2011-12-15 DIAGNOSIS — C3490 Malignant neoplasm of unspecified part of unspecified bronchus or lung: Secondary | ICD-10-CM | POA: Insufficient documentation

## 2011-12-15 DIAGNOSIS — N289 Disorder of kidney and ureter, unspecified: Secondary | ICD-10-CM | POA: Insufficient documentation

## 2011-12-15 DIAGNOSIS — K449 Diaphragmatic hernia without obstruction or gangrene: Secondary | ICD-10-CM | POA: Insufficient documentation

## 2011-12-15 DIAGNOSIS — K7689 Other specified diseases of liver: Secondary | ICD-10-CM | POA: Insufficient documentation

## 2011-12-15 DIAGNOSIS — K439 Ventral hernia without obstruction or gangrene: Secondary | ICD-10-CM | POA: Insufficient documentation

## 2011-12-15 LAB — CBC WITH DIFFERENTIAL/PLATELET
Basophils Absolute: 0.1 10*3/uL (ref 0.0–0.1)
EOS%: 1.4 % (ref 0.0–7.0)
HCT: 45.8 % (ref 34.8–46.6)
HGB: 14.8 g/dL (ref 11.6–15.9)
LYMPH%: 16.8 % (ref 14.0–49.7)
MCH: 28.5 pg (ref 25.1–34.0)
MCHC: 32.4 g/dL (ref 31.5–36.0)
MCV: 88 fL (ref 79.5–101.0)
NEUT%: 73.1 % (ref 38.4–76.8)
Platelets: 266 10*3/uL (ref 145–400)
lymph#: 1.7 10*3/uL (ref 0.9–3.3)

## 2011-12-15 LAB — CMP (CANCER CENTER ONLY)
AST: 23 U/L (ref 11–38)
BUN, Bld: 14 mg/dL (ref 7–22)
Calcium: 9.4 mg/dL (ref 8.0–10.3)
Chloride: 101 mEq/L (ref 98–108)
Creat: 0.8 mg/dl (ref 0.6–1.2)
Total Bilirubin: 0.7 mg/dl (ref 0.20–1.60)

## 2011-12-15 MED ORDER — IOHEXOL 300 MG/ML  SOLN
100.0000 mL | Freq: Once | INTRAMUSCULAR | Status: AC | PRN
  Administered 2011-12-15: 100 mL via INTRAVENOUS

## 2011-12-15 NOTE — Progress Notes (Signed)
Called CT and gave message to Caryn Bee to give to pt to notify of f/u visit being scheduled w/ Dr. Gaylyn Rong or NP so she can get her results.  He will give pt message to expect call from our scheduling dept.Marland Kitchen

## 2011-12-15 NOTE — Telephone Encounter (Signed)
lmonvm adviisng the pt of her appt with dr ha on 12/17/2011

## 2011-12-17 ENCOUNTER — Ambulatory Visit: Payer: Medicare Other | Admitting: Nutrition

## 2011-12-17 ENCOUNTER — Telehealth: Payer: Self-pay | Admitting: Oncology

## 2011-12-17 ENCOUNTER — Ambulatory Visit (HOSPITAL_BASED_OUTPATIENT_CLINIC_OR_DEPARTMENT_OTHER): Payer: Medicare Other | Admitting: Oncology

## 2011-12-17 ENCOUNTER — Ambulatory Visit (HOSPITAL_BASED_OUTPATIENT_CLINIC_OR_DEPARTMENT_OTHER): Payer: Medicare Other | Admitting: Lab

## 2011-12-17 ENCOUNTER — Encounter: Payer: Self-pay | Admitting: Oncology

## 2011-12-17 VITALS — BP 108/63 | HR 79 | Temp 97.5°F | Resp 20 | Ht 61.0 in | Wt 192.2 lb

## 2011-12-17 DIAGNOSIS — C3490 Malignant neoplasm of unspecified part of unspecified bronchus or lung: Secondary | ICD-10-CM

## 2011-12-17 DIAGNOSIS — R6884 Jaw pain: Secondary | ICD-10-CM

## 2011-12-17 DIAGNOSIS — C3410 Malignant neoplasm of upper lobe, unspecified bronchus or lung: Secondary | ICD-10-CM

## 2011-12-17 DIAGNOSIS — R635 Abnormal weight gain: Secondary | ICD-10-CM

## 2011-12-17 DIAGNOSIS — R0602 Shortness of breath: Secondary | ICD-10-CM

## 2011-12-17 LAB — COMPREHENSIVE METABOLIC PANEL
ALT: 28 U/L (ref 0–35)
AST: 20 U/L (ref 0–37)
Albumin: 3.9 g/dL (ref 3.5–5.2)
Alkaline Phosphatase: 115 U/L (ref 39–117)
BUN: 18 mg/dL (ref 6–23)
CO2: 33 mEq/L — ABNORMAL HIGH (ref 19–32)
Calcium: 9.5 mg/dL (ref 8.4–10.5)
Chloride: 103 mEq/L (ref 96–112)
Creatinine, Ser: 0.97 mg/dL (ref 0.50–1.10)
Glucose, Bld: 91 mg/dL (ref 70–99)
Potassium: 4.7 mEq/L (ref 3.5–5.3)
Sodium: 143 mEq/L (ref 135–145)
Total Bilirubin: 0.4 mg/dL (ref 0.3–1.2)
Total Protein: 6.4 g/dL (ref 6.0–8.3)

## 2011-12-17 LAB — TSH: TSH: 2.932 u[IU]/mL (ref 0.350–4.500)

## 2011-12-17 LAB — CBC WITH DIFFERENTIAL/PLATELET
BASO%: 0.9 % (ref 0.0–2.0)
EOS%: 1.2 % (ref 0.0–7.0)
MCH: 29.2 pg (ref 25.1–34.0)
MCHC: 33.5 g/dL (ref 31.5–36.0)
RDW: 15 % — ABNORMAL HIGH (ref 11.2–14.5)
lymph#: 2 10*3/uL (ref 0.9–3.3)

## 2011-12-17 NOTE — Progress Notes (Signed)
[facility]  Telephone:([phone] [phone]   OFFICE PROGRESS NOTE   DIAGNOSIS: History of a 2 cm invasive adenocarcinoma of the left upper lobe of the lung with well to moderately differentiated without stroke or invasion (pT2 N0 M0 with 0 out of 2 lymph nodes positive).  PAST THERAPY: Status post wedge resection of the left upper lobe on 02/10/2009 and adjuvant radiation therapy with brachytherapy.  CURRENT THERAPY: Watchful observation.  INTERVAL HISTORY: Brandy Cameron 76 y.o. female returns for regular follow up with a friend.  She reports feeling well.  She has been having weight gain.  She eats fatty foods with lots of fried chickens and salty foods.  She has mild SOB and DOE that has been chronic even before the diagnosis of lung cancer and lobectomy.  She has bilateral jaw pain on exertion with improvement using nitroglycerin.  She complained of memory loss recently but frank confusion.    Patient denies fever, anorexia, weight loss, fatigue, headache, visual changes, confusion, drenching night sweats, palpable lymph node swelling, mucositis, odynophagia, dysphagia, nausea vomiting, jaundice, chest pain, palpitation, shortness of breath, dyspnea on exertion, productive cough, gum bleeding, epistaxis, hematemesis, hemoptysis, abdominal pain, abdominal swelling, early satiety, melena, hematochezia, hematuria, skin rash, spontaneous bleeding, joint swelling, joint pain, heat or cold intolerance, bowel bladder incontinence, back pain, focal motor weakness, paresthesia, depression, suicidal or homicidal ideation, feeling hopelessness.   Past Medical History  Diagnosis Date  . NSTEMI (non-ST elevated myocardial infarction)     with only mild to moderate CAD in September of 2011,There was concern for apival ballooning. She had hyperdynamic LV function.  Marland Kitchen HTN (hypertension)   . COPD (chronic obstructive pulmonary disease)   . CAD (coronary artery disease)     . Lung cancer     [name]  Marland Kitchen Weight gain 12/17/2011    Past Surgical History  Procedure Date  . Knee surgery   . Vesicovaginal fistula closure w/ tah   . Gallbladder surgery   . Cataract surgery   . Foot surgery     Current Outpatient Prescriptions  Medication Sig Dispense Refill  . aspirin 81 MG tablet Take 81 mg by mouth daily.        . furosemide (LASIX) 40 MG tablet Take 40 mg by mouth daily.        Marland Kitchen losartan (COZAAR) 100 MG tablet Take 1 tablet (100 mg total) by mouth daily.  30 tablet  12  . metoprolol tartrate (LOPRESSOR) 25 MG tablet Take 1 tablet (25 mg total) by mouth 2 (two) times daily.  60 tablet  5  . NITROSTAT 0.4 MG SL tablet DISSOLVE 1 TABLET UNDER THE TONGUE EVERY 5 MINUTES AS NEEDED UP TO 3 DOSES  25 tablet  0  . potassium chloride SA (K-DUR,KLOR-CON) 20 MEQ tablet Take 20 mEq by mouth daily.       . simvastatin (ZOCOR) 40 MG tablet Take 1 tablet (40 mg total) by mouth at bedtime.  30 tablet  12    ALLERGIES:  is allergic to sulfa antibiotics and sulfonamide derivatives.  REVIEW OF SYSTEMS:  The rest of the 14-point review of system was negative.   Filed Vitals:   12/17/11 1025  BP: 108/63  Pulse: 79  Temp: 97.5 F (36.4 C)  Resp: 20   Wt Readings from Last 3 Encounters:  12/17/11 192 lb 3.2 oz (87.181 kg)  12/01/11 190 lb (86.183 kg)  12/26/10 175 lb (79.379 kg)   ECOG Performance status: 1  PHYSICAL EXAMINATION:   General:  Mildly obese woman, in no acute distress.  Eyes:  no scleral icterus.  ENT:  There were no oropharyngeal lesions.  Neck was without thyromegaly.  Lymphatics:  Negative cervical, supraclavicular or axillary adenopathy.  Respiratory: lungs were clear bilaterally without wheezing or crackles.  Cardiovascular:  Regular rate and rhythm, S1/S2, without murmur, rub or gallop.  There was no pedal edema.  GI:  abdomen was soft, obese, nontender,  nondistended, without organomegaly.  Muscoloskeletal:  no spinal tenderness of palpation of vertebral spine.  Skin exam was without echymosis, petichae.  Neuro exam was nonfocal.  Patient was able to get on and off exam table without assistance.  Gait was normal.  Patient was alerted and oriented.  Attention was good.   Language was appropriate.  Mood was normal without depression.  Speech was not pressured.  Thought content was not tangential.     LABORATORY/RADIOLOGY DATA:  Lab Results  Component Value Date   WBC 9.5 12/17/2011   HGB 14.5 12/17/2011   HCT 43.4 12/17/2011   PLT 267 12/17/2011   GLUCOSE 102 12/07/2010   CHOL  Value: 149        ATP III CLASSIFICATION:  <200     mg/dL   Desirable  578-469  mg/dL   Borderline High  >=629    mg/dL   High        09/21/4130   TRIG 140 01/21/2010   HDL 43 01/21/2010   LDLCALC  Value: 78        Total Cholesterol/HDL:CHD Risk Coronary Heart Disease Risk Table                     Men   Women  1/2 Average Risk   3.4   3.3  Average Risk       5.0   4.4  2 X Average Risk   9.6   7.1  3 X Average Risk  23.4   11.0        Use the calculated Patient Ratio above and the CHD Risk Table to determine the patient's CHD Risk.        ATP III CLASSIFICATION (LDL):  <100     mg/dL   Optimal  440-102  mg/dL   Near or Above                    Optimal  130-159  mg/dL   Borderline  725-366  mg/dL   High  >440     mg/dL   Very High 3/47/4259   ALKPHOS 131* 12/07/2010   ALT 22 01/21/2010   AST 24 12/07/2010   NA 143 12/07/2010   K 4.4 12/07/2010   CL 101 12/07/2010   CREATININE 0.9 12/07/2010   BUN 21 12/07/2010   CO2 31 12/07/2010   INR 1.05 01/21/2010   HGBA1C  Value: 6.5 (NOTE)                                                                       According to the ADA Clinical Practice Recommendations for 2011, when HbA1c is used  as a screening test:   >=6.5%   Diagnostic of Diabetes Mellitus           (if abnormal result  is confirmed)  5.7-6.4%   Increased risk of developing Diabetes  [place]  References:Diagnosis and Classification of Diabetes Mellitus,Diabetes Care,2011,34(Suppl 1):S62-S69 and Standards of Medical Care in         Diabetes - 2011,Diabetes Care,2011,34  (Suppl 1):S11-S61.* 01/20/2010    IMAGING:  I personally reviewed the following scans and showed the images to the patient and her friend.   Ct Chest W Contrast  12/15/2011  *RADIOLOGY REPORT*  Clinical Data: Lung cancer.  CT CHEST WITH CONTRAST  Technique:  Multidetector CT imaging of the chest was performed following the standard protocol during bolus administration of intravenous contrast.  Contrast: OMNIPAQUE IOHEXOL 300 MG/ML  SOLN  Comparison: 12/07/2010 and baseline examination of 01/08/2009.  Findings: No pathologically enlarged mediastinal, hilar or axillary lymph nodes.  Heart size normal.  No pericardial effusion.  Tiny hiatal hernia.  A 6 mm nodule in the right lower lobe (image 36) is unchanged from 01/08/2009.  Postoperative changes and radioactive seed placement are seen in the upper left hemithorax, stable.  Scattered tiny nodular densities are unchanged from 12/07/2010.  Nodular density in the in the left upper lobe (image 14) is unchanged and may relate to scarring.  No pleural fluid.  Airway is unremarkable.  Incidental imaging of the upper abdomen shows low attenuation throughout the visualized portion of the liver.  Small ventral hernia contains fat.  A 1.8 cm low attenuation lesion in the upper pole left kidney is unchanged.  No worrisome lytic or sclerotic lesions.  Degenerative changes are seen in the spine.  IMPRESSION:  1.  Right lower lobe nodule is unchanged from baseline examination of 01/08/2009, favoring a benign etiology. 2.  Scattered tiny pulmonary parenchymal nodular densities are unchanged from 12/07/2010. 3.  Hepatic steatosis. 4.  Small ventral hernia contains fat.   Original Report Authenticated By: Reyes Ivan, M.D.    Mr Brain Wo Contrast  12/01/2011  *RADIOLOGY REPORT*   Clinical Data: 76 year old female with increasing memory loss. 2010 history of lung cancer, status post treatment and thought to be disease free.  MRI HEAD WITHOUT CONTRAST  Technique:  Multiplanar, multiecho pulse sequences of the brain and surrounding structures were obtained according to standard protocol without intravenous contrast.  Comparison: Head CT without and with contrast 01/11/2009.  Findings: Dominant distal left vertebral artery is dolichoectatic, along with some tortuosity of the basilar artery.  There is subsequent mild mass effect on the lower brain stem.  No other intracranial mass effect. The distal right vertebral artery is very diminutive, nondominant.  Other major intracranial vascular flow voids are preserved.  Cerebral volume is within normal limits for age.  No restricted diffusion to suggest acute infarction.  No ventriculomegaly, extra- axial collection or acute intracranial hemorrhage. Cervicomedullary junction and pituitary are within normal limits. No evidence of intracranial mass lesion on these noncontrast images.  No areas of cerebral edema identified.   Gray-white matter signal is within normal limits for age.  Negative visualized cervical spine.  Normal bone marrow signal.  Postoperative changes to the globes.  Minor paranasal sinus mucosal thickening.  Minor mastoid air cell fluid.  Negative visualized nasopharynx.  Negative scalp soft tissues.  IMPRESSION: Negative for age noncontrast MRI appearance of the brain.  Original Report Authenticated By: Harley Hallmark, M.D.    ASSESSMENT AND PLAN:  1.  History of lung cancer:  No evidence of diease recurrence on clinical history, physical exam, lab, and CT scan.  Next surveillance CT chest will be one year from now but sooner if she has concerning symptoms.  She no longer smokes cigarettes.   2.  Dyspnea on exertion:  Most likely due to COPD.  This is mild.   3.  Bilateral jaw pain:  Concerning for cardiac causes.  She is  due to have a stress test with her PCP.   4.  Weight gain:  I sent for TSH to rule out hypothyroidism.  I sent her to Vernell Leep, Nutrition to discuss low fat/heart healthy diet.   5.  Follow up:  In one year.     The length of time of the face-to-face encounter was 15 minutes. More than 50% of time was spent counseling and coordination of care.

## 2011-12-17 NOTE — Telephone Encounter (Signed)
appts made and printed for pt aom °

## 2011-12-17 NOTE — Patient Instructions (Addendum)
A.  Result of CT scan   Findings: No pathologically enlarged mediastinal, hilar or axillary  lymph nodes. Heart size normal. No pericardial effusion. Tiny  hiatal hernia.   A 6 mm nodule in the right lower lobe (image 36) is unchanged from  01/08/2009. Postoperative changes and radioactive seed placement  are seen in the upper left hemithorax, stable. Scattered tiny  nodular densities are unchanged from 12/07/2010. Nodular density  in the in the left upper lobe (image 14) is unchanged and may  relate to scarring. No pleural fluid. Airway is unremarkable.  Incidental imaging of the upper abdomen shows low attenuation  throughout the visualized portion of the liver. Small ventral  hernia contains fat. A 1.8 cm low attenuation lesion in the upper  pole left kidney is unchanged. No worrisome lytic or sclerotic  lesions. Degenerative changes are seen in the spine.   IMPRESSION:  1. Right lower lobe nodule is unchanged from baseline examination  of 01/08/2009, favoring a benign etiology.  2. Scattered tiny pulmonary parenchymal nodular densities are  unchanged from 12/07/2010.  3. Hepatic steatosis.  4. Small ventral hernia contains fat.  B.  Follow up:  In about 1 year until at least Oct 2015.

## 2011-12-17 NOTE — Assessment & Plan Note (Signed)
Brandy Cameron is a 76 year old , female patient of Dr. Lodema Pilot, diagnosed with lung cancer, status post surgery.  MEDICAL HISTORY INCLUDES:  MI, hypertension, COPD, coronary artery disease, and weight gain.  MEDICATIONS INCLUDE:  Lasix, K-Dur and Zocor.  LABS:  Reviewed.  HEIGHT:  61 inches. WEIGHT:  192.2 pounds. USUAL BODY WEIGHT:  175 pounds September 2012. BMI:  36.33.  NUTRITIONAL NEEDS:  1300-1500 calories, 65-75 g of protein, 1.5 L of fluid.  The patient reports she has gained a lot of weight since her surgery. She does cook for herself and does not enjoy other people's cooking. She does eat 3 meals a day, consuming fried foods, high-fat meats and high-fat ice cream.  The patient enjoys fruits and vegetables but does not consume a lot of these unless she eats out.  She is able to verbalize that she does need to lose weight and is interested in some nutrition therapy for that.  NUTRITION DIAGNOSIS:  Overweight/obesity, related to excessive energy intake and food and nutrition-related knowledge deficit as evidenced by BMI of 36.33 and progressive weight gain.  INTERVENTION:  I have educated the patient and sister on the importance of choosing lower-fat foods and increasing vegetables and whole fruits. I have encouraged the patient to consume only lean proteins.  We discussed portion sizes in detail.  I have provided multiple fact sheets on strategies for weight loss including calorie modification, choosing low-fat and low-sodium foods as well as gradually increasing exercise as tolerated.  I have answered the patient's questions.  MONITORING/EVALUATION (GOALS):  That patient will tolerate a low-fat, heart-healthy diet to promote safe weight loss.  NEXT VISIT:  The patient has my contact information for questions.   ______________________________ Zenovia Jarred, RD, CSO, LDN Clinical Nutrition Specialist BN/MEDQ  D:  12/17/2011  T:  12/17/2011  Job:  [id number]

## 2012-01-03 ENCOUNTER — Ambulatory Visit (HOSPITAL_COMMUNITY): Payer: Medicare Other | Attending: Internal Medicine | Admitting: Radiology

## 2012-01-03 VITALS — BP 121/74 | HR 70 | Ht 61.0 in | Wt 189.0 lb

## 2012-01-03 DIAGNOSIS — R42 Dizziness and giddiness: Secondary | ICD-10-CM | POA: Insufficient documentation

## 2012-01-03 DIAGNOSIS — Z87891 Personal history of nicotine dependence: Secondary | ICD-10-CM | POA: Insufficient documentation

## 2012-01-03 DIAGNOSIS — R0602 Shortness of breath: Secondary | ICD-10-CM

## 2012-01-03 DIAGNOSIS — R0789 Other chest pain: Secondary | ICD-10-CM | POA: Insufficient documentation

## 2012-01-03 DIAGNOSIS — R0989 Other specified symptoms and signs involving the circulatory and respiratory systems: Secondary | ICD-10-CM | POA: Insufficient documentation

## 2012-01-03 DIAGNOSIS — R0609 Other forms of dyspnea: Secondary | ICD-10-CM | POA: Insufficient documentation

## 2012-01-03 DIAGNOSIS — E785 Hyperlipidemia, unspecified: Secondary | ICD-10-CM

## 2012-01-03 DIAGNOSIS — R079 Chest pain, unspecified: Secondary | ICD-10-CM

## 2012-01-03 DIAGNOSIS — I10 Essential (primary) hypertension: Secondary | ICD-10-CM

## 2012-01-03 DIAGNOSIS — I2510 Atherosclerotic heart disease of native coronary artery without angina pectoris: Secondary | ICD-10-CM

## 2012-01-03 DIAGNOSIS — R000 Tachycardia, unspecified: Secondary | ICD-10-CM | POA: Insufficient documentation

## 2012-01-03 MED ORDER — TECHNETIUM TC 99M SESTAMIBI GENERIC - CARDIOLITE
30.0000 | Freq: Once | INTRAVENOUS | Status: AC | PRN
  Administered 2012-01-03: 30 via INTRAVENOUS

## 2012-01-03 MED ORDER — REGADENOSON 0.4 MG/5ML IV SOLN
0.4000 mg | Freq: Once | INTRAVENOUS | Status: AC
  Administered 2012-01-03: 0.4 mg via INTRAVENOUS

## 2012-01-03 MED ORDER — TECHNETIUM TC 99M SESTAMIBI GENERIC - CARDIOLITE
10.0000 | Freq: Once | INTRAVENOUS | Status: AC | PRN
  Administered 2012-01-03: 10 via INTRAVENOUS

## 2012-01-03 NOTE — Progress Notes (Signed)
[facility] SITE 3 NUCLEAR MED [address] [place] [address] [phone]  Cardiology Nuclear Med Study  Brandy Cameron is a 76 y.o. female     MRN : [id number]     DOB: 24-Oct-1935  Procedure Date: 01/03/2012  Nuclear Med Background Indication for Stress Test:  Evaluation for Ischemia History:  '10 Echo:EF=60%; '11 NSTEMI>Cath:Moderate CAD, medical tx. Cardiac Risk Factors: Family History - CAD, History of Smoking, Hypertension, Lipids and Obesity  Symptoms:  Chest Tightness and Jaw Pain (not at the same time, per patient) (last episode of chest discomfort was about 2-weeks ago), Diaphoresis, Dizziness, DOE, Fatigue and Rapid HR   Nuclear Pre-Procedure Caffeine/Decaff Intake:  None NPO After: 8:30pm   Lungs:  Expiratory wheezes.  Albuterol inhaler used prior to [employer]. O2 Sat: 96% on room air. IV 0.9% NS with Angio Cath:  20g  IV Site: R Hand  IV Started by:  Cathlyn Parsons, RN  Chest Size (in):  38 Cup Size: C  Height: 5\' 1"  (1.549 m)  Weight:  189 lb (85.73 kg)  BMI:  Body mass index is 35.71 kg/(m^2). Tech Comments:  Lopressor taken at 0700 today    Nuclear Med Study 1 or 2 day study: 1 day  Stress Test Type:  Lexiscan  Reading MD: Dietrich Pates, MD  Order Authorizing Provider:  Olga Millers, MD  Resting Radionuclide: Technetium 25m Sestamibi  Resting Radionuclide Dose: 11.0 mCi   Stress Radionuclide:  Technetium 101m Sestamibi  Stress Radionuclide Dose: 33.0 mCi           Stress Protocol Rest HR: 70 Stress HR: 94  Rest BP: 121/74 Stress BP: 139/77  Exercise Time (min): n/a METS: n/a   Predicted Max HR: 146 bpm % Max HR: 64.38 bpm Rate Pressure Product: 21308   Dose of Adenosine (mg):  n/a Dose of Lexiscan: 0.4 mg  Dose of Atropine (mg): n/a Dose of Dobutamine: n/a mcg/kg/min (at max HR)  Stress Test Technologist: Smiley Houseman, CMA-N  Nuclear Technologist:  Domenic Polite, CNMT     Rest Procedure:  Myocardial perfusion imaging was  performed at rest 45 minutes following the intravenous administration of Technetium 8m Sestamibi.  Rest ECG: Normal sinus rhythm.  Stress Procedure:  The patient received IV Lexiscan 0.4 mg over 15-seconds.  Technetium 49m Sestamibi was injected at 30-seconds.  There were no significant changes with Lexiscan.  Quantitative spect images were obtained after a 45 minute delay.  Stress ECG: No significant change from baseline ECG  QPS Raw Data Images:  Soft tissue (diaphragm, bowel) underlie heart. Stress Images:  Normal homogeneous uptake in all areas of the myocardium. Rest Images:  Normal homogeneous uptake in all areas of the myocardium. Subtraction (SDS):  No evidence of ischemia. Transient Ischemic Dilatation (Normal <1.22):  1.17 Lung/Heart Ratio (Normal <0.45):  0.31  Quantitative Gated Spect Images QGS EDV:  50 ml QGS ESV:  14 ml  Impression Exercise Capacity:  Lexiscan with no exercise. BP Response:  Normal blood pressure response. Clinical Symptoms:  Mild chest pain/dyspnea. ECG Impression:  No significant ST segment change suggestive of ischemia. Comparison with Prior Nuclear Study: No previous nuclear study performed  Overall Impression:  Normal stress nuclear study.  LV Ejection Fraction: 71%.  LV Wall Motion:  NL LV Function; NL Wall Motion  Dietrich Pates

## 2012-01-10 ENCOUNTER — Encounter (INDEPENDENT_AMBULATORY_CARE_PROVIDER_SITE_OTHER): Payer: Medicare Other

## 2012-01-10 DIAGNOSIS — R0989 Other specified symptoms and signs involving the circulatory and respiratory systems: Secondary | ICD-10-CM

## 2012-08-10 ENCOUNTER — Other Ambulatory Visit: Payer: Self-pay | Admitting: Cardiology

## 2012-10-17 ENCOUNTER — Telehealth: Payer: Self-pay | Admitting: Oncology

## 2012-10-23 ENCOUNTER — Emergency Department (INDEPENDENT_AMBULATORY_CARE_PROVIDER_SITE_OTHER): Payer: Medicare Other

## 2012-10-23 ENCOUNTER — Emergency Department
Admission: EM | Admit: 2012-10-23 | Discharge: 2012-10-23 | Disposition: A | Discharge status: Home / Self Care | Payer: Medicare Other | Source: Home / Self Care | Attending: Family Medicine | Admitting: Family Medicine

## 2012-10-23 ENCOUNTER — Encounter: Payer: Self-pay | Admitting: *Deleted

## 2012-10-23 DIAGNOSIS — M65839 Other synovitis and tenosynovitis, unspecified forearm: Secondary | ICD-10-CM

## 2012-10-23 DIAGNOSIS — M19049 Primary osteoarthritis, unspecified hand: Secondary | ICD-10-CM

## 2012-10-23 DIAGNOSIS — M778 Other enthesopathies, not elsewhere classified: Secondary | ICD-10-CM

## 2012-10-23 DIAGNOSIS — M65849 Other synovitis and tenosynovitis, unspecified hand: Secondary | ICD-10-CM

## 2012-10-23 HISTORY — DX: Unspecified asthma, uncomplicated: J45.909

## 2012-10-23 MED ORDER — DICLOFENAC SODIUM 1 % TD GEL: TRANSDERMAL | Status: DC

## 2012-10-23 NOTE — ED Provider Notes (Signed)
History    CSN: [id number] Arrival date & time 10/23/12  1022  First MD Initiated Contact with Patient 10/23/12 1049     Chief Complaint  Patient presents with  . Hand Problem    right      HPI Comments: Patient was playing cards about 4 days ago and noted pain/swelling in the dorsum of her right hand.  She recalls no injury to the area.  No recent change in activities or repetitive motion with right hand.  Patient is a 77 y.o. female presenting with hand pain. The history is provided by the patient.  Hand Pain This is a new problem. The current episode started more than 2 days ago. The problem occurs constantly. The problem has not changed since onset.Associated symptoms comments: none. Exacerbated by: grasping with right hand. Nothing relieves the symptoms. She has tried nothing for the symptoms.   Past Medical History  Diagnosis Date  . NSTEMI (non-ST elevated myocardial infarction)     with only mild to moderate CAD in September of 2011,There was concern for apival ballooning. She had hyperdynamic LV function.  Marland Kitchen HTN (hypertension)   . COPD (chronic obstructive pulmonary disease)   . CAD (coronary artery disease)   . Lung cancer     [name]  Marland Kitchen Weight gain 12/17/2011  . Asthma    Past Surgical History  Procedure Laterality Date  . Knee surgery    . Vesicovaginal fistula closure w/ tah    . Gallbladder surgery    . Cataract surgery    . Foot surgery    . Lung lobectomy     Family History  Problem Relation Age of Onset  . Heart disease      POSITIVE FAMILY HX. OF   . Pancreatitis      QUESTIONABLE FROM ACE INHIBITORS  . Heart disease Mother 46  . Asthma Mother   . Diabetes Mother   . Cancer Mother    History  Substance Use Topics  . Smoking status: Former Games developer  . Smokeless tobacco: Never Used     Comment: She stopped smoking four years ago.  . Alcohol Use: No   OB History     Grav Para Term Preterm Abortions TAB SAB Ect Mult Living                 Review of Systems  All other systems reviewed and are negative.    Allergies  Sulfa antibiotics and Sulfonamide derivatives  Home Medications   Current Outpatient Rx  Name  Route  Sig  Dispense  Refill  . budesonide-formoterol (SYMBICORT) 160-4.5 MCG/ACT inhaler   Inhalation   Inhale 2 puffs into the lungs 2 (two) times daily.         Marland Kitchen aspirin 81 MG tablet   Oral   Take 81 mg by mouth daily.           . diclofenac sodium (VOLTAREN) 1 % GEL      Apply one gram to affected hand three times daily   100 g   1   . furosemide (LASIX) 40 MG tablet   Oral   Take 40 mg by mouth daily.           Marland Kitchen losartan (COZAAR) 100 MG tablet   Oral   Take 1 tablet (100 mg total) by mouth daily.   30 tablet   12   .  metoprolol tartrate (LOPRESSOR) 25 MG tablet      TAKE 1 TABLET BY MOUTH 2 TIMES DAILY   60 tablet   2   . NITROSTAT 0.4 MG SL tablet      DISSOLVE 1 TABLET UNDER THE TONGUE EVERY 5 MINUTES AS NEEDED UP TO 3 DOSES   25 tablet   0   . potassium chloride SA (K-DUR,KLOR-CON) 20 MEQ tablet   Oral   Take 20 mEq by mouth daily.          . simvastatin (ZOCOR) 40 MG tablet   Oral   Take 1 tablet (40 mg total) by mouth at bedtime.   30 tablet   12    BP 141/80  Pulse 93  Temp(Src) 97.9 F (36.6 C) (Oral)  Resp 20  Wt 201 lb (91.173 kg)  BMI 38 kg/m2  SpO2 94% Physical Exam  Nursing note and vitals reviewed. Constitutional: She is oriented to person, place, and time. She appears well-developed and well-nourished. No distress.  Patient is obese (BMI 38)  HENT:  Head: Normocephalic.  Eyes: Conjunctivae are normal. Pupils are equal, round, and reactive to light.  Musculoskeletal: She exhibits tenderness.       Right hand: She exhibits tenderness, bony tenderness and swelling. She exhibits normal range of motion, normal two-point discrimination, normal capillary refill and no  deformity. Normal sensation noted. Normal strength noted.       Hands: Right hand has tenderness and swelling dorsally over 2nd and 3rd metacarpals.  Pain is elicited with resisted extension of 2nd and 3rd fingers while palpating extensor tendons.  Distal neurovascular function is intact.   Neurological: She is alert and oriented to person, place, and time.  Skin: Skin is warm and dry. No rash noted. No erythema.    ED Course  Procedures  none   Dg Hand Complete Right  10/23/2012   *RADIOLOGY REPORT*  Clinical Data: Right hand pain for the past 4 days.  RIGHT HAND - COMPLETE 3+ VIEW  Comparison: No priors.  Findings: Three views of the right hand demonstrate no acute displaced fracture, subluxation or dislocation.  There is joint space narrowing, subchondral sclerosis, subchondral cyst formation and osteophyte formation which is most pronounced in the DIP, PIP and first CMC joints, compatible with osteoarthritis.  IMPRESSION: 1.  No acute abnormality of the right hand to account for the patient's symptoms. 2.  Mild degenerative changes of osteoarthritis, as above.   Original Report Authenticated By: Trudie Reed, M.D.   1. Hand tendonitis     MDM  Begin Voltaren gel to dorsum of hand. Apply ice pack 3 or 4 times daily for about 10 minutes. Followup with [facility] if not improving about two weeks.   Lattie Haw, MD 10/23/12 1151

## 2012-10-23 NOTE — ED Notes (Addendum)
Brandy Cameron c/o right hand swelling and pain without injury x 3-4 days. Hx blood clot in left arm with IV.  Right hand negative for redness. Warm to touch.

## 2012-12-13 ENCOUNTER — Other Ambulatory Visit: Payer: Medicare Other | Admitting: Lab

## 2012-12-13 ENCOUNTER — Other Ambulatory Visit (HOSPITAL_COMMUNITY): Payer: Medicare Other

## 2012-12-15 ENCOUNTER — Ambulatory Visit: Payer: Medicare Other | Admitting: Oncology

## 2013-08-06 ENCOUNTER — Telehealth: Payer: Self-pay | Admitting: Cardiology

## 2013-08-06 NOTE — Telephone Encounter (Signed)
Spoke with pt, follow up appt made for clearance.

## 2013-08-06 NOTE — Telephone Encounter (Signed)
New Message  Pt returned call. She requests a call back to discuss cardiac clearance. Please call

## 2013-08-08 ENCOUNTER — Ambulatory Visit (INDEPENDENT_AMBULATORY_CARE_PROVIDER_SITE_OTHER): Payer: Medicare Other | Admitting: Cardiology

## 2013-08-08 ENCOUNTER — Encounter: Payer: Self-pay | Admitting: Cardiology

## 2013-08-08 ENCOUNTER — Ambulatory Visit: Payer: Medicare Other | Admitting: Cardiology

## 2013-08-08 VITALS — BP 126/86 | HR 70 | Wt 205.0 lb

## 2013-08-08 DIAGNOSIS — E785 Hyperlipidemia, unspecified: Secondary | ICD-10-CM

## 2013-08-08 DIAGNOSIS — I10 Essential (primary) hypertension: Secondary | ICD-10-CM

## 2013-08-08 DIAGNOSIS — I2510 Atherosclerotic heart disease of native coronary artery without angina pectoris: Secondary | ICD-10-CM

## 2013-08-08 DIAGNOSIS — Z01810 Encounter for preprocedural cardiovascular examination: Secondary | ICD-10-CM

## 2013-08-08 DIAGNOSIS — I679 Cerebrovascular disease, unspecified: Secondary | ICD-10-CM

## 2013-08-08 NOTE — Progress Notes (Signed)
HPI: FU coronary artery disease. Echocardiogram in September 2010 showed normal LV function. Cardiac catheterization in September of 2011 revealed Left main normal. Left circumflex has irregularities. Left anterior descending with a 50% focal stenosis in the midportion of the vessel and scattered irregularities otherwise; 50% stenosis in the proximal right coronary artery, but otherwise no significant focal stenosis is present. Inferior apical akinesis with overall well-preserved left ventricular function in a somewhat hyperdynamic manner. Nuclear study in September 2013 showed an ejection fraction of 71% and normal perfusion. Carotid Dopplers in September 2013 showed 0-39% bilateral stenosis and followup recommended in one year. She was last seen in August of 2013. Since then, She has dyspnea on exertion which is unchanged. No orthopnea or PND. Occasional minimal pedal edema. No chest pain.   Current Outpatient Prescriptions  Medication Sig Dispense Refill  . aspirin 81 MG tablet Take 81 mg by mouth daily.        . budesonide-formoterol (SYMBICORT) 160-4.5 MCG/ACT inhaler Inhale 2 puffs into the lungs 2 (two) times daily.      . furosemide (LASIX) 40 MG tablet Take 40 mg by mouth daily.        Marland Kitchen losartan (COZAAR) 100 MG tablet Take 1 tablet (100 mg total) by mouth daily.  30 tablet  12  . metoprolol tartrate (LOPRESSOR) 25 MG tablet TAKE 1 TABLET BY MOUTH 2 TIMES DAILY  60 tablet  2  . NITROSTAT 0.4 MG SL tablet DISSOLVE 1 TABLET UNDER THE TONGUE EVERY 5 MINUTES AS NEEDED UP TO 3 DOSES  25 tablet  0  . potassium chloride SA (K-DUR,KLOR-CON) 20 MEQ tablet Take 20 mEq by mouth daily.       . simvastatin (ZOCOR) 40 MG tablet Take 1 tablet (40 mg total) by mouth at bedtime.  30 tablet  12   No current facility-administered medications for this visit.     Past Medical History  Diagnosis Date  . NSTEMI (non-ST elevated myocardial infarction)     with only mild to moderate CAD in September  of 2011,There was concern for apival ballooning. She had hyperdynamic LV function.  Marland Kitchen HTN (hypertension)   . COPD (chronic obstructive pulmonary disease)   . CAD (coronary artery disease)   . Lung cancer     [name]  Marland Kitchen Weight gain 12/17/2011  . Asthma     Past Surgical History  Procedure Laterality Date  . Knee surgery    . Vesicovaginal fistula closure w/ tah    . Gallbladder surgery    . Cataract surgery    . Foot surgery    . Lung lobectomy      History   Social History  . Marital Status: Divorced    Spouse Name: N/A    Number of Children: N/A  . Years of Education: N/A   Occupational History  . Not on file.   Social History Main Topics  . Smoking status: Former Research scientist (life sciences)  . Smokeless tobacco: Never Used     Comment: She stopped smoking four years ago.  . Alcohol Use: No  . Drug Use: Not on file  . Sexual Activity: Not on file   Other Topics Concern  . Not on file   Social History Narrative  . No narrative on file    ROS: knee arthralgias but no fevers or chills, productive cough, hemoptysis, dysphasia, odynophagia, melena, hematochezia, dysuria, hematuria,  rash, seizure activity, orthopnea, PND, claudication. Remaining systems are negative.  Physical Exam: Well-developed obese in no acute distress.  Skin is warm and dry.  HEENT is normal.  Neck is supple.  Chest is clear to auscultation with normal expansion.  Cardiovascular exam is regular rate and rhythm.  Abdominal exam nontender or distended. No masses palpated. Extremities show Trace edema. neuro grossly intact  ECG 08/03/2013-sinus rhythm with no ST changes.

## 2013-08-08 NOTE — Assessment & Plan Note (Signed)
Continue statin. 

## 2013-08-08 NOTE — Patient Instructions (Addendum)
Your physician wants you to follow-up in: [place] will receive a reminder letter in the mail two months in advance. If you don't receive a letter, please call our office to schedule the follow-up appointment.   Your physician has requested that you have a carotid duplex. This test is an ultrasound of the carotid arteries in your neck. It looks at blood flow through these arteries that supply the brain with blood. Allow one hour for this exam. There are no restrictions or special instructions.

## 2013-08-08 NOTE — Assessment & Plan Note (Signed)
Patient is scheduled for left knee replacement. Nuclear study in September of 2013 showed no ischemia and preserved LV function. No recent chest pain. Electrocardiogram shows no ST changes. No plans for further ischemia evaluation preoperatively.

## 2013-08-08 NOTE — Assessment & Plan Note (Signed)
Continue aspirin and statin. 

## 2013-08-08 NOTE — Assessment & Plan Note (Signed)
Continue aspirin and statin. Schedule followup carotid Dopplers.

## 2013-08-08 NOTE — Assessment & Plan Note (Signed)
Blood pressure controlled. Continue present medications. 

## 2013-08-09 ENCOUNTER — Telehealth: Payer: Self-pay | Admitting: Cardiology

## 2013-08-09 NOTE — Telephone Encounter (Signed)
Received request from Nurse fax box, documents faxed for surgical clearance. To: [facility] Fax number: [phone] Attention: [id number]/kdm

## 2013-08-29 ENCOUNTER — Inpatient Hospital Stay (HOSPITAL_COMMUNITY)
Admission: EM | Admit: 2013-08-29 | Discharge: 2013-09-01 | DRG: 439 | Disposition: A | Discharge status: Home / Self Care | Payer: Medicare Other | Attending: Internal Medicine | Admitting: Internal Medicine

## 2013-08-29 ENCOUNTER — Encounter (HOSPITAL_COMMUNITY): Payer: Self-pay | Admitting: Emergency Medicine

## 2013-08-29 ENCOUNTER — Emergency Department (HOSPITAL_COMMUNITY): Payer: Medicare Other

## 2013-08-29 DIAGNOSIS — Z8249 Family history of ischemic heart disease and other diseases of the circulatory system: Secondary | ICD-10-CM

## 2013-08-29 DIAGNOSIS — Z87891 Personal history of nicotine dependence: Secondary | ICD-10-CM

## 2013-08-29 DIAGNOSIS — Z7982 Long term (current) use of aspirin: Secondary | ICD-10-CM

## 2013-08-29 DIAGNOSIS — R0989 Other specified symptoms and signs involving the circulatory and respiratory systems: Secondary | ICD-10-CM

## 2013-08-29 DIAGNOSIS — L259 Unspecified contact dermatitis, unspecified cause: Secondary | ICD-10-CM

## 2013-08-29 DIAGNOSIS — Z882 Allergy status to sulfonamides status: Secondary | ICD-10-CM

## 2013-08-29 DIAGNOSIS — K859 Acute pancreatitis without necrosis or infection, unspecified: Principal | ICD-10-CM | POA: Diagnosis present

## 2013-08-29 DIAGNOSIS — B86 Scabies: Secondary | ICD-10-CM

## 2013-08-29 DIAGNOSIS — R635 Abnormal weight gain: Secondary | ICD-10-CM

## 2013-08-29 DIAGNOSIS — J449 Chronic obstructive pulmonary disease, unspecified: Secondary | ICD-10-CM | POA: Diagnosis present

## 2013-08-29 DIAGNOSIS — Z01810 Encounter for preprocedural cardiovascular examination: Secondary | ICD-10-CM

## 2013-08-29 DIAGNOSIS — I252 Old myocardial infarction: Secondary | ICD-10-CM

## 2013-08-29 DIAGNOSIS — I679 Cerebrovascular disease, unspecified: Secondary | ICD-10-CM

## 2013-08-29 DIAGNOSIS — Z833 Family history of diabetes mellitus: Secondary | ICD-10-CM

## 2013-08-29 DIAGNOSIS — E785 Hyperlipidemia, unspecified: Secondary | ICD-10-CM | POA: Diagnosis present

## 2013-08-29 DIAGNOSIS — R944 Abnormal results of kidney function studies: Secondary | ICD-10-CM | POA: Diagnosis present

## 2013-08-29 DIAGNOSIS — Z85118 Personal history of other malignant neoplasm of bronchus and lung: Secondary | ICD-10-CM

## 2013-08-29 DIAGNOSIS — C3490 Malignant neoplasm of unspecified part of unspecified bronchus or lung: Secondary | ICD-10-CM

## 2013-08-29 DIAGNOSIS — Z825 Family history of asthma and other chronic lower respiratory diseases: Secondary | ICD-10-CM

## 2013-08-29 DIAGNOSIS — J9610 Chronic respiratory failure, unspecified whether with hypoxia or hypercapnia: Secondary | ICD-10-CM | POA: Diagnosis present

## 2013-08-29 DIAGNOSIS — I10 Essential (primary) hypertension: Secondary | ICD-10-CM | POA: Diagnosis present

## 2013-08-29 DIAGNOSIS — I2510 Atherosclerotic heart disease of native coronary artery without angina pectoris: Secondary | ICD-10-CM | POA: Diagnosis present

## 2013-08-29 DIAGNOSIS — J45909 Unspecified asthma, uncomplicated: Secondary | ICD-10-CM

## 2013-08-29 DIAGNOSIS — J4489 Other specified chronic obstructive pulmonary disease: Secondary | ICD-10-CM | POA: Diagnosis present

## 2013-08-29 DIAGNOSIS — Z923 Personal history of irradiation: Secondary | ICD-10-CM

## 2013-08-29 LAB — COMPREHENSIVE METABOLIC PANEL
ALT: 44 U/L — AB (ref 0–35)
AST: 22 U/L (ref 0–37)
Albumin: 3.7 g/dL (ref 3.5–5.2)
Alkaline Phosphatase: 135 U/L — ABNORMAL HIGH (ref 39–117)
BUN: 20 mg/dL (ref 6–23)
CALCIUM: 9.8 mg/dL (ref 8.4–10.5)
CO2: 30 meq/L (ref 19–32)
CREATININE: 0.89 mg/dL (ref 0.50–1.10)
Chloride: 102 mEq/L (ref 96–112)
GFR, EST AFRICAN AMERICAN: 71 mL/min — AB (ref >90)
GFR, EST NON AFRICAN AMERICAN: 61 mL/min — AB (ref >90)
GLUCOSE: 102 mg/dL — AB (ref 70–99)
Potassium: 4.9 mEq/L (ref 3.7–5.3)
SODIUM: 144 meq/L (ref 137–147)
TOTAL PROTEIN: 6.9 g/dL (ref 6.0–8.3)
Total Bilirubin: 0.2 mg/dL — ABNORMAL LOW (ref 0.3–1.2)

## 2013-08-29 LAB — CBC WITH DIFFERENTIAL/PLATELET
BASOS ABS: 0 10*3/uL (ref 0.0–0.1)
Basophils Relative: 0 % (ref 0–1)
EOS ABS: 0.3 10*3/uL (ref 0.0–0.7)
EOS PCT: 3 % (ref 0–5)
HEMATOCRIT: 44.4 % (ref 36.0–46.0)
Hemoglobin: 14 g/dL (ref 12.0–15.0)
LYMPHS PCT: 17 % (ref 12–46)
Lymphs Abs: 2.1 10*3/uL (ref 0.7–4.0)
MCH: 29.5 pg (ref 26.0–34.0)
MCHC: 31.5 g/dL (ref 30.0–36.0)
MCV: 93.7 fL (ref 78.0–100.0)
MONO ABS: 1.2 10*3/uL — AB (ref 0.1–1.0)
Monocytes Relative: 10 % (ref 3–12)
Neutro Abs: 8.4 10*3/uL — ABNORMAL HIGH (ref 1.7–7.7)
Neutrophils Relative %: 70 % (ref 43–77)
PLATELETS: 299 10*3/uL (ref 150–400)
RBC: 4.74 MIL/uL (ref 3.87–5.11)
RDW: 14.2 % (ref 11.5–15.5)
WBC: 12 10*3/uL — ABNORMAL HIGH (ref 4.0–10.5)

## 2013-08-29 LAB — URINALYSIS, ROUTINE W REFLEX MICROSCOPIC
BILIRUBIN URINE: NEGATIVE
Glucose, UA: NEGATIVE mg/dL
Hgb urine dipstick: NEGATIVE
KETONES UR: NEGATIVE mg/dL
Leukocytes, UA: NEGATIVE
NITRITE: NEGATIVE
PH: 5.5 (ref 5.0–8.0)
PROTEIN: 100 mg/dL — AB
Specific Gravity, Urine: 1.026 (ref 1.005–1.030)
UROBILINOGEN UA: 0.2 mg/dL (ref 0.0–1.0)

## 2013-08-29 LAB — I-STAT TROPONIN, ED: TROPONIN I, POC: 0 ng/mL (ref 0.00–0.08)

## 2013-08-29 LAB — I-STAT CG4 LACTIC ACID, ED: Lactic Acid, Venous: 0.93 mmol/L (ref 0.5–2.2)

## 2013-08-29 LAB — URINE MICROSCOPIC-ADD ON

## 2013-08-29 LAB — LIPASE, BLOOD: LIPASE: 180 U/L — AB (ref 11–59)

## 2013-08-29 MED ORDER — HYDROMORPHONE HCL PF 1 MG/ML IJ SOLN
1.0000 mg | Freq: Once | INTRAMUSCULAR | Status: AC
  Administered 2013-08-29: 1 mg via INTRAVENOUS
  Filled 2013-08-29: qty 1

## 2013-08-29 MED ORDER — SODIUM CHLORIDE 0.9 % IV BOLUS (SEPSIS)
1000.0000 mL | Freq: Once | INTRAVENOUS | Status: AC
  Administered 2013-08-29: 1000 mL via INTRAVENOUS

## 2013-08-29 MED ORDER — OXYCODONE-ACETAMINOPHEN 5-325 MG PO TABS
1.0000 | ORAL_TABLET | Freq: Once | ORAL | Status: AC
  Administered 2013-08-29: 1 via ORAL
  Filled 2013-08-29: qty 1

## 2013-08-29 MED ORDER — IOHEXOL 300 MG/ML  SOLN
100.0000 mL | Freq: Once | INTRAMUSCULAR | Status: AC | PRN
  Administered 2013-08-29: 100 mL via INTRAVENOUS

## 2013-08-29 MED ORDER — ONDANSETRON 4 MG PO TBDP
8.0000 mg | ORAL_TABLET | Freq: Once | ORAL | Status: AC
  Administered 2013-08-29: 8 mg via ORAL
  Filled 2013-08-29: qty 2

## 2013-08-29 MED ORDER — ONDANSETRON HCL 4 MG/2ML IJ SOLN
4.0000 mg | Freq: Once | INTRAMUSCULAR | Status: AC
  Administered 2013-08-29: 4 mg via INTRAVENOUS
  Filled 2013-08-29: qty 2

## 2013-08-29 MED ORDER — MORPHINE SULFATE 4 MG/ML IJ SOLN
4.0000 mg | Freq: Once | INTRAMUSCULAR | Status: AC
  Administered 2013-08-29: 4 mg via INTRAVENOUS
  Filled 2013-08-29: qty 1

## 2013-08-29 MED ORDER — IOHEXOL 300 MG/ML  SOLN
25.0000 mL | Freq: Once | INTRAMUSCULAR | Status: AC | PRN
  Administered 2013-08-29: 25 mL via ORAL

## 2013-08-29 NOTE — ED Notes (Signed)
Dr. Hal Hope at bedside.

## 2013-08-29 NOTE — ED Provider Notes (Signed)
CSN: [id number]     Arrival date & time 08/29/13  1705 History   First MD Initiated Contact with Patient 08/29/13 1943     Chief Complaint  Patient presents with  . Abdominal Pain     (Consider location/radiation/quality/duration/timing/severity/associated sxs/prior Treatment) The history is provided by the patient.   history of present illness: 78 year old female who presents with chief complaint of abdominal pain. Onset of symptoms was 2 days ago. Patient reports progressively worsening epigastric abdominal pain. It is nonradiating. It is described as sharp and stabbing and has been constant. Pain currently rated severe. She has had associated nausea. She denies vomiting, diarrhea, constipation, melena, hematochezia. No history of fevers. Patient has had previous episodes of pancreatitis and feels that symptoms are similar.  Past Medical History  Diagnosis Date  . NSTEMI (non-ST elevated myocardial infarction)     with only mild to moderate CAD in September of 2011,There was concern for apival ballooning. She had hyperdynamic LV function.  Marland Kitchen HTN (hypertension)   . COPD (chronic obstructive pulmonary disease)   . CAD (coronary artery disease)   . Lung cancer     [name]  Marland Kitchen Weight gain 12/17/2011  . Asthma    Past Surgical History  Procedure Laterality Date  . Knee surgery    . Vesicovaginal fistula closure w/ tah    . Gallbladder surgery    . Cataract surgery    . Foot surgery    . Lung lobectomy     Family History  Problem Relation Age of Onset  . Heart disease      POSITIVE FAMILY HX. OF   . Pancreatitis      QUESTIONABLE FROM ACE INHIBITORS  . Heart disease Mother 17  . Asthma Mother   . Diabetes Mother   . Cancer Mother    History  Substance Use Topics  . Smoking status: Former Research scientist (life sciences)  . Smokeless tobacco: Never Used     Comment: She stopped smoking four years ago.  . Alcohol Use:  No   OB History   Grav Para Term Preterm Abortions TAB SAB Ect Mult Living                 Review of Systems  Constitutional: Negative for fever and chills.  HENT: Negative for congestion.   Eyes: Negative for pain.  Respiratory: Negative for shortness of breath.   Cardiovascular: Negative for chest pain.  Gastrointestinal: Positive for nausea and abdominal pain. Negative for vomiting, diarrhea and constipation.  Genitourinary: Negative for dysuria.  Musculoskeletal: Negative for back pain.  Skin: Negative for rash and wound.  Neurological: Negative for headaches.  All other systems reviewed and are negative.     Allergies  Sulfa antibiotics and Sulfonamide derivatives  Home Medications   Prior to Admission medications   Medication Sig Start Date End Date Taking? Authorizing Provider  aspirin 81 MG tablet Take 81 mg by mouth daily.     Yes Historical Provider, MD  budesonide-formoterol (SYMBICORT) 160-4.5 MCG/ACT inhaler Inhale 2 puffs into the lungs 2 (two) times daily.   Yes Historical Provider, MD  cephALEXin (KEFLEX) 500 MG capsule Take 500 mg by mouth 3 (three) times daily.   Yes Historical Provider, MD  furosemide (LASIX) 40 MG tablet Take 40 mg by mouth daily.     Yes Historical Provider, MD  HYDROcodone-acetaminophen (NORCO) 10-325 MG per tablet Take 1 tablet  by mouth every 6 (six) hours as needed.   Yes Historical Provider, MD  losartan (COZAAR) 100 MG tablet Take 1 tablet (100 mg total) by mouth daily. 11/30/11  Yes Lelon Perla, MD  metoprolol tartrate (LOPRESSOR) 25 MG tablet TAKE 1 TABLET BY MOUTH 2 TIMES DAILY 08/10/12  Yes Lelon Perla, MD  potassium chloride SA (K-DUR,KLOR-CON) 20 MEQ tablet Take 20 mEq by mouth daily.    Yes Historical Provider, MD  NITROSTAT 0.4 MG SL tablet DISSOLVE 1 TABLET UNDER THE TONGUE EVERY 5 MINUTES AS NEEDED UP TO 3 DOSES 12/10/10   Lelon Perla, MD   BP 145/76  Pulse 104  Temp(Src) 98.1 F (36.7 C) (Oral)  Resp 18   SpO2 93% Physical Exam  Nursing note and vitals reviewed. Constitutional: She is oriented to person, place, and time. She appears well-developed and well-nourished. No distress.  HENT:  Head: Normocephalic and atraumatic.  Eyes: Conjunctivae are normal.  Neck: Neck supple.  Cardiovascular: Normal rate, regular rhythm, normal heart sounds and intact distal pulses.   Pulmonary/Chest: Effort normal and breath sounds normal. She has no wheezes. She has no rales.  Abdominal: Soft. There is tenderness in the epigastric area. There is guarding. There is no rigidity.  Musculoskeletal: Normal range of motion.  Neurological: She is alert and oriented to person, place, and time.  Skin: Skin is warm and dry.    ED Course  Procedures (including critical care time) Labs Review Labs Reviewed  CBC WITH DIFFERENTIAL - Abnormal; Notable for the following:    WBC 12.0 (*)    Neutro Abs 8.4 (*)    Monocytes Absolute 1.2 (*)    All other components within normal limits  COMPREHENSIVE METABOLIC PANEL - Abnormal; Notable for the following:    Glucose, Bld 102 (*)    ALT 44 (*)    Alkaline Phosphatase 135 (*)    Total Bilirubin 0.2 (*)    GFR calc non Af Amer 61 (*)    GFR calc Af Amer 71 (*)    All other components within normal limits  LIPASE, BLOOD - Abnormal; Notable for the following:    Lipase 180 (*)    All other components within normal limits  URINALYSIS, ROUTINE W REFLEX MICROSCOPIC - Abnormal; Notable for the following:    Protein, ur 100 (*)    All other components within normal limits  URINE MICROSCOPIC-ADD ON - Abnormal; Notable for the following:    Squamous Epithelial / LPF FEW (*)    All other components within normal limits  TROPONIN I  LIPID PANEL  COMPREHENSIVE METABOLIC PANEL  CBC WITH DIFFERENTIAL  LIPASE, BLOOD  CBC  CREATININE, SERUM  I-STAT TROPOININ, ED  I-STAT CG4 LACTIC ACID, ED    Imaging Review Ct Abdomen Pelvis W Contrast  08/29/2013   CLINICAL DATA:   Abdominal pain.  EXAM: CT ABDOMEN AND PELVIS WITH CONTRAST  TECHNIQUE: Multidetector CT imaging of the abdomen and pelvis was performed using the standard protocol following bolus administration of intravenous contrast.  CONTRAST:  177mL OMNIPAQUE IOHEXOL 300 MG/ML  SOLN  COMPARISON:  01/08/2009  FINDINGS: BODY WALL: Fatty epigastric abdominal wall hernia.  LOWER CHEST: RCA atherosclerosis. Stable 7 mm nodule in the right lower lobe, benign based on 5 year stability.  ABDOMEN/PELVIS:  Liver: Mild diffuse fatty infiltration.  No mass lesion.  Biliary: Cholecystectomy.  Pancreas: There is fat infiltration around the midline pancreas. No ductal dilatation or evidence of mass. No fluid collection or  vascular compromise.  Spleen: Unremarkable.  Adrenals: Unremarkable.  Kidneys and ureters: 18 mm cyst in the upper pole left kidney. There is a 16 mm low-density, but not water density, lesion in the upper to interpolar posterior right kidney, appearance similar to partial imaging on chest CT 12/15/2011. No de-enhancement on delayed imaging. No abnormality seen in this region on abdominal ultrasound 01/10/2009. No recent non contrast imaging. Mild relative left renal atrophy without visible high-grade arterial stenosis.  Bladder: Unremarkable.  Reproductive: Hysterectomy.  Bowel: Extensive distal colonic diverticulosis. No bowel obstruction. Normal appendix.  Retroperitoneum: No mass or adenopathy.  Peritoneum: No free fluid or gas.  Vascular: No acute abnormality.  OSSEOUS: No acute abnormalities.  IMPRESSION: 1. Mild acute pancreatitis, recommend confirmation with pancreatic enzyme analysis. No fluid collection or necrosis. 2. Cholecystectomy. 3. 16 mm lesion in the upper right kidney which is likely a proteinaceous cyst, although solid mass cannot be excluded. Further evaluation is recommended. Renal ultrasound could be attempted first, although renal protocol MRI or CT may ultimately be required.   Electronically Signed    By: Jorje Guild M.D.   On: 08/29/2013 22:39     EKG Interpretation   Date/Time:  Wednesday Aug 29 2013 17:18:30 EDT Ventricular Rate:  89 PR Interval:  176 QRS Duration: 60 QT Interval:  340 QTC Calculation: 413 R Axis:   -15 Text Interpretation:  Normal sinus rhythm Anterior infarct , age  undetermined Abnormal ECG Sinus rhythm Artifact No significant change  since last tracing Abnormal ekg Confirmed by Carmin Muskrat  MD [phone])  on 08/29/2013 5:23:09 PM      MDM   Final diagnoses:  Pancreatitis    Brandy Cameron is a 78 y.o. female with PMSH of coronary artery disease, COPD, lung cancer, pancreatitis here with 2 days of worsening epigastric abdominal pain and associated nausea. AF VSS. Epigastric tenderness to palpation with guarding on exam..  Concern for pancreatitis. Patient has had prior cholecystectomy the No lower abdominal pain or tenderness. Have lower suspicion but would also consider atypical presentation for ACS.  Labs/Imaging: CBC, CMP, lipase, troponin, lactic acid.  Giving IVF bolus, morphine, Zofran.  Labs with leukocytosis of 12,000. Lipase 180 and 3 times the upper limit of normal consistent with pancreatitis.  Patient with continued pain after morphine. Giving Dilaudid. CT scan showed mild pancreatitis and possible proteinaceous cyst in the upper right kidney.  Due to continued pain will consult hospitalist for admission.  Renaldo Reel, MD 08/30/13 [phone]

## 2013-08-29 NOTE — ED Notes (Signed)
Pt reporting epigastric pain for 2 days, burning into chest and back pain. States feels like pancreatitis has been nauseated, no v/d. Pt is a x 4. Knee surgery in April.

## 2013-08-30 ENCOUNTER — Inpatient Hospital Stay (HOSPITAL_COMMUNITY): Payer: Medicare Other

## 2013-08-30 DIAGNOSIS — K859 Acute pancreatitis without necrosis or infection, unspecified: Principal | ICD-10-CM

## 2013-08-30 LAB — GLUCOSE, CAPILLARY
Glucose-Capillary: 138 mg/dL — ABNORMAL HIGH (ref 70–99)
Glucose-Capillary: 110 mg/dL — ABNORMAL HIGH (ref 70–99)
Glucose-Capillary: 119 mg/dL — ABNORMAL HIGH (ref 70–99)

## 2013-08-30 LAB — LIPASE, BLOOD: LIPASE: 113 U/L — AB (ref 11–59)

## 2013-08-30 LAB — COMPREHENSIVE METABOLIC PANEL
ALT: 62 U/L — ABNORMAL HIGH (ref 0–35)
AST: 46 U/L — ABNORMAL HIGH (ref 0–37)
Albumin: 3.1 g/dL — ABNORMAL LOW (ref 3.5–5.2)
Alkaline Phosphatase: 130 U/L — ABNORMAL HIGH (ref 39–117)
BUN: 14 mg/dL (ref 6–23)
CO2: 23 mEq/L (ref 19–32)
Calcium: 9.1 mg/dL (ref 8.4–10.5)
Chloride: 103 mEq/L (ref 96–112)
Creatinine, Ser: 0.68 mg/dL (ref 0.50–1.10)
GFR calc Af Amer: >90 mL/min (ref >90)
GFR calc non Af Amer: 83 mL/min — ABNORMAL LOW (ref >90)
Glucose, Bld: 117 mg/dL — ABNORMAL HIGH (ref 70–99)
Potassium: 4.7 mEq/L (ref 3.7–5.3)
Sodium: 141 mEq/L (ref 137–147)
Total Bilirubin: 0.6 mg/dL (ref 0.3–1.2)
Total Protein: 6.1 g/dL (ref 6.0–8.3)

## 2013-08-30 LAB — LIPID PANEL
CHOLESTEROL: 154 mg/dL (ref 0–200)
HDL: 51 mg/dL (ref >39)
LDL Cholesterol: 77 mg/dL (ref 0–99)
Total CHOL/HDL Ratio: 3 RATIO
Triglycerides: 129 mg/dL (ref <150)
VLDL: 26 mg/dL (ref 0–40)

## 2013-08-30 LAB — CBC WITH DIFFERENTIAL/PLATELET
Basophils Absolute: 0 10*3/uL (ref 0.0–0.1)
Basophils Relative: 0 % (ref 0–1)
Eosinophils Absolute: 0.2 10*3/uL (ref 0.0–0.7)
Eosinophils Relative: 1 % (ref 0–5)
HCT: 41 % (ref 36.0–46.0)
Hemoglobin: 12.6 g/dL (ref 12.0–15.0)
Lymphocytes Relative: 11 % — ABNORMAL LOW (ref 12–46)
Lymphs Abs: 1.5 10*3/uL (ref 0.7–4.0)
MCH: 29 pg (ref 26.0–34.0)
MCHC: 30.7 g/dL (ref 30.0–36.0)
MCV: 94.3 fL (ref 78.0–100.0)
Monocytes Absolute: 1.4 10*3/uL — ABNORMAL HIGH (ref 0.1–1.0)
Monocytes Relative: 11 % (ref 3–12)
Neutro Abs: 9.7 10*3/uL — ABNORMAL HIGH (ref 1.7–7.7)
Neutrophils Relative %: 77 % (ref 43–77)
Platelets: 272 10*3/uL (ref 150–400)
RBC: 4.35 MIL/uL (ref 3.87–5.11)
RDW: 14.5 % (ref 11.5–15.5)
WBC: 12.7 10*3/uL — ABNORMAL HIGH (ref 4.0–10.5)

## 2013-08-30 LAB — TROPONIN I: Troponin I: <0.3 ng/mL (ref <0.30)

## 2013-08-30 MED ORDER — SODIUM CHLORIDE 0.9 % IV SOLN
INTRAVENOUS | Status: DC
  Administered 2013-08-30 (×2): via INTRAVENOUS

## 2013-08-30 MED ORDER — PANTOPRAZOLE SODIUM 40 MG PO TBEC
40.0000 mg | DELAYED_RELEASE_TABLET | Freq: Every day | ORAL | Status: DC
  Administered 2013-08-30 – 2013-09-01 (×3): 40 mg via ORAL
  Filled 2013-08-30 (×3): qty 1

## 2013-08-30 MED ORDER — ACETAMINOPHEN 650 MG RE SUPP: 650.0000 mg | Freq: Four times a day (QID) | RECTAL | Status: DC | PRN

## 2013-08-30 MED ORDER — HYDROMORPHONE HCL PF 1 MG/ML IJ SOLN
1.0000 mg | INTRAMUSCULAR | Status: DC | PRN
  Administered 2013-08-30 – 2013-08-31 (×4): 1 mg via INTRAVENOUS
  Filled 2013-08-30 (×4): qty 1

## 2013-08-30 MED ORDER — ALBUTEROL SULFATE (2.5 MG/3ML) 0.083% IN NEBU: 2.5000 mg | INHALATION_SOLUTION | RESPIRATORY_TRACT | Status: DC | PRN

## 2013-08-30 MED ORDER — ASPIRIN EC 81 MG PO TBEC
81.0000 mg | DELAYED_RELEASE_TABLET | Freq: Every day | ORAL | Status: DC
  Administered 2013-08-30 – 2013-09-01 (×3): 81 mg via ORAL
  Filled 2013-08-30 (×3): qty 1

## 2013-08-30 MED ORDER — NITROGLYCERIN 0.4 MG SL SUBL: 0.4000 mg | SUBLINGUAL_TABLET | SUBLINGUAL | Status: DC | PRN

## 2013-08-30 MED ORDER — ASPIRIN 81 MG PO TABS: 81.0000 mg | ORAL_TABLET | Freq: Every day | ORAL | Status: DC

## 2013-08-30 MED ORDER — POTASSIUM CHLORIDE CRYS ER 20 MEQ PO TBCR
20.0000 meq | EXTENDED_RELEASE_TABLET | Freq: Every day | ORAL | Status: DC
  Administered 2013-08-30 – 2013-09-01 (×3): 20 meq via ORAL
  Filled 2013-08-30 (×3): qty 1

## 2013-08-30 MED ORDER — HYDROCODONE-ACETAMINOPHEN 5-325 MG PO TABS
1.0000 | ORAL_TABLET | Freq: Four times a day (QID) | ORAL | Status: DC | PRN
  Administered 2013-08-30 – 2013-09-01 (×4): 1 via ORAL
  Filled 2013-08-30 (×5): qty 1

## 2013-08-30 MED ORDER — HYDROMORPHONE HCL PF 1 MG/ML IJ SOLN
1.0000 mg | INTRAMUSCULAR | Status: DC | PRN
  Administered 2013-08-30 (×4): 1 mg via INTRAVENOUS
  Filled 2013-08-30 (×4): qty 1

## 2013-08-30 MED ORDER — ENOXAPARIN SODIUM 40 MG/0.4ML ~~LOC~~ SOLN
40.0000 mg | Freq: Every day | SUBCUTANEOUS | Status: DC
  Administered 2013-08-30 – 2013-09-01 (×3): 40 mg via SUBCUTANEOUS
  Filled 2013-08-30 (×3): qty 0.4

## 2013-08-30 MED ORDER — BUDESONIDE-FORMOTEROL FUMARATE 160-4.5 MCG/ACT IN AERO
2.0000 | INHALATION_SPRAY | Freq: Two times a day (BID) | RESPIRATORY_TRACT | Status: DC
  Administered 2013-08-30 – 2013-09-01 (×3): 2 via RESPIRATORY_TRACT
  Filled 2013-08-30 (×2): qty 6

## 2013-08-30 MED ORDER — ONDANSETRON HCL 4 MG/2ML IJ SOLN
4.0000 mg | Freq: Four times a day (QID) | INTRAMUSCULAR | Status: DC | PRN
  Administered 2013-08-30: 4 mg via INTRAVENOUS
  Filled 2013-08-30: qty 2

## 2013-08-30 MED ORDER — SODIUM CHLORIDE 0.9 % IV SOLN
INTRAVENOUS | Status: AC
  Administered 2013-08-30: 13:00:00 via INTRAVENOUS

## 2013-08-30 MED ORDER — METOPROLOL TARTRATE 25 MG PO TABS
25.0000 mg | ORAL_TABLET | Freq: Two times a day (BID) | ORAL | Status: DC
  Administered 2013-08-30 – 2013-09-01 (×5): 25 mg via ORAL
  Filled 2013-08-30 (×6): qty 1

## 2013-08-30 MED ORDER — ALBUTEROL SULFATE (2.5 MG/3ML) 0.083% IN NEBU
2.5000 mg | INHALATION_SOLUTION | Freq: Four times a day (QID) | RESPIRATORY_TRACT | Status: DC
Start: 2013-08-30 — End: 2013-09-01
  Administered 2013-08-30 – 2013-08-31 (×4): 2.5 mg via RESPIRATORY_TRACT
  Filled 2013-08-30 (×6): qty 3

## 2013-08-30 MED ORDER — HYDROCODONE-ACETAMINOPHEN 5-325 MG PO TABS
0.5000 | ORAL_TABLET | Freq: Three times a day (TID) | ORAL | Status: DC | PRN
  Administered 2013-08-30: 0.5 via ORAL
  Filled 2013-08-30: qty 1

## 2013-08-30 MED ORDER — HYDROMORPHONE HCL PF 1 MG/ML IJ SOLN: 1.0000 mg | INTRAMUSCULAR | Status: DC | PRN

## 2013-08-30 MED ORDER — ACETAMINOPHEN 325 MG PO TABS: 650.0000 mg | ORAL_TABLET | Freq: Four times a day (QID) | ORAL | Status: DC | PRN

## 2013-08-30 MED ORDER — FUROSEMIDE 40 MG PO TABS
40.0000 mg | ORAL_TABLET | Freq: Every day | ORAL | Status: DC
  Administered 2013-08-30 – 2013-09-01 (×3): 40 mg via ORAL
  Filled 2013-08-30 (×3): qty 1

## 2013-08-30 MED ORDER — METOPROLOL TARTRATE 1 MG/ML IV SOLN
2.5000 mg | Freq: Four times a day (QID) | INTRAVENOUS | Status: DC
  Administered 2013-08-30 (×3): 2.5 mg via INTRAVENOUS
  Filled 2013-08-30 (×6): qty 5

## 2013-08-30 MED ORDER — HYDROCODONE-ACETAMINOPHEN 5-325 MG PO TABS
1.0000 | ORAL_TABLET | Freq: Four times a day (QID) | ORAL | Status: DC | PRN
Start: 2013-08-30 — End: 2013-08-30

## 2013-08-30 MED ORDER — HYDROMORPHONE HCL PF 1 MG/ML IJ SOLN
0.5000 mg | Freq: Four times a day (QID) | INTRAMUSCULAR | Status: DC | PRN
  Administered 2013-08-30: 0.5 mg via INTRAVENOUS
  Filled 2013-08-30: qty 1

## 2013-08-30 MED ORDER — ONDANSETRON HCL 4 MG PO TABS: 4.0000 mg | ORAL_TABLET | Freq: Four times a day (QID) | ORAL | Status: DC | PRN

## 2013-08-30 NOTE — Progress Notes (Signed)
Utilization review completed. Hagar Sadiq, RN, BSN. 

## 2013-08-30 NOTE — Progress Notes (Addendum)
PROGRESS NOTE    Brandy Cameron DZH:[id number] DOB: 1936/01/23 DOA: 08/29/2013 PCP: Brandy Seller, MD  HPI/Brief narrative 78 year old female with history of COPD, on nightly home oxygen 2 L per minute, CAD, nuclear stress test 2013: EF 71% and normal perfusion, lung cancer LUL, status post wedge resection and adjuvant radiation therapy with brachytherapy-no evidence of disease recurrence as per her oncologist note 12/17/11, former smoker, HTN, pancreatitis, cholecystectomy, recent left knee surgery, presented with complaints of abdominal pain and nausea. In the ED, CT abdomen suggests mild pancreatitis and lipase is mildly elevated.   Assessment/Plan:  1. Acute pancreatitis: Unclear etiology. No history of alcohol use. No gallstones were reported on CT abdomen. Recently treated with Keflex. No history of insect bites. Triglycerides: Unremarkable. No complicating features on CT abdomen. Will get abdominal ultrasound. Treat supportively with bowel rest, IV fluids and pain management. Once clinically better, advance diet gradually. Will add IV PPI. Will eventually need GI consultation as outpatient for EUS given history of pancreatitis x2 of unclear etiology, advanced age and prior history of cancer. 2. COPD/chronic respiratory failure: Mild wheezing. When necessary bronchodilators. Continue oxygen. Monitor closely. As per son, patient had to be intubated in the past due to over sedation with pain medications. We'll minimize pain medications. Continuous pulse oximetry. We'll get chest x-ray  3. Hypertension: Controlled. Change metoprolol to by mouth. Hold Cozaar. 4. History of CAD: Asymptomatic of chest pain. Continue aspirin and metoprolol. 5. History of lung cancer: Apparently in remission. Outpatient followup 6. Recent left knee surgery: Completed Keflex treatment on 5/2 for superficial skin infection. No acute features at this time. PT and OT evaluation. Surgeon in [place]. 7. Right  kidney lesion on CT abdomen:? Proteinaceous cyst? Solid mass. As per radiologist's recommendation, will obtain a renal ultrasound but ultimately may need renal protocol MRI or CT to further delineate.   Code Status: Full Family Communication: discussed with patient's son Mr. Brandy Cameron. Disposition Plan: Remains inpatient. Home when medically stable.   Consultants:  None  Procedures:  None  Antibiotics:  None   Subjective: Persists with mid upper abdominal pain, improved after pain medications but gets worse 10/10 once the pain medication wears off. Nausea. No vomiting. DOE- chronic. No chest pain or cough  Objective: Filed Vitals:   08/29/13 2300 08/30/13 0015 08/30/13 0614 08/30/13 1028  BP: 145/76 154/22 137/56   Pulse: 104 109 76   Temp:  97.7 F (36.5 C) 99.8 F (37.7 C)   TempSrc:  Oral Oral   Resp:   18   SpO2: 93% 99% 95% 95%    Intake/Output Summary (Last 24 hours) at 08/30/13 1140 Last data filed at 08/30/13 0830  Gross per 24 hour  Intake    600 ml  Output      0 ml  Net    600 ml   There were no vitals filed for this visit.   Exam:  General exam: Moderately built and obese female patient lying comfortably supine in bed. Respiratory system: Slightly reduced breath sounds bilaterally with scattered few expiratory rhonchi. No increased work of breathing. Cardiovascular system: S1 & S2 heard, RRR. No JVD, murmurs, gallops, clicks. Trace bilateral ankle edema. Gastrointestinal system: Abdomen is nondistended, soft. Mild epigastric tenderness without peritoneal signs. Normal bowel sounds heard. Central nervous system: Alert and oriented. No focal neurological deficits. Extremities: Symmetric 5 x 5 power. Left knee surgical incision site clean, dry and intact.   Data Reviewed: Basic Metabolic Panel:  Recent Labs Lab  08/29/13 1733 08/30/13 0550  NA 144 141  K 4.9 4.7  CL 102 103  CO2 30 23  GLUCOSE 102* 117*  BUN 20 14  CREATININE 0.89  0.68  CALCIUM 9.8 9.1   Liver Function Tests:  Recent Labs Lab 08/29/13 1733 08/30/13 0550  AST 22 46*  ALT 44* 62*  ALKPHOS 135* 130*  BILITOT 0.2* 0.6  PROT 6.9 6.1  ALBUMIN 3.7 3.1*    Recent Labs Lab 08/29/13 1733 08/30/13 0550  LIPASE 180* 113*   No results found for this basename: AMMONIA,  in the last 168 hours CBC:  Recent Labs Lab 08/29/13 1733 08/30/13 0550  WBC 12.0* 12.7*  NEUTROABS 8.4* 9.7*  HGB 14.0 12.6  HCT 44.4 41.0  MCV 93.7 94.3  PLT 299 272   Cardiac Enzymes:  Recent Labs Lab 08/30/13 0550  TROPONINI <0.30   BNP (last 3 results) No results found for this basename: PROBNP,  in the last 8760 hours CBG:  Recent Labs Lab 08/30/13 0654  GLUCAP 138*    No results found for this or any previous visit (from the past 240 hour(s)).    Additional labs: 1. Fasting lipids: Cholesterol 154, triglycerides 129, HDL 51, LDL 77 and VLDL 26     Studies: Ct Abdomen Pelvis W Contrast  08/29/2013   CLINICAL DATA:  Abdominal pain.  EXAM: CT ABDOMEN AND PELVIS WITH CONTRAST  TECHNIQUE: Multidetector CT imaging of the abdomen and pelvis was performed using the standard protocol following bolus administration of intravenous contrast.  CONTRAST:  133mL OMNIPAQUE IOHEXOL 300 MG/ML  SOLN  COMPARISON:  01/08/2009  FINDINGS: BODY WALL: Fatty epigastric abdominal wall hernia.  LOWER CHEST: RCA atherosclerosis. Stable 7 mm nodule in the right lower lobe, benign based on 5 year stability.  ABDOMEN/PELVIS:  Liver: Mild diffuse fatty infiltration.  No mass lesion.  Biliary: Cholecystectomy.  Pancreas: There is fat infiltration around the midline pancreas. No ductal dilatation or evidence of mass. No fluid collection or vascular compromise.  Spleen: Unremarkable.  Adrenals: Unremarkable.  Kidneys and ureters: 18 mm cyst in the upper pole left kidney. There is a 16 mm low-density, but not water density, lesion in the upper to interpolar posterior right kidney,  appearance similar to partial imaging on chest CT 12/15/2011. No de-enhancement on delayed imaging. No abnormality seen in this region on abdominal ultrasound 01/10/2009. No recent non contrast imaging. Mild relative left renal atrophy without visible high-grade arterial stenosis.  Bladder: Unremarkable.  Reproductive: Hysterectomy.  Bowel: Extensive distal colonic diverticulosis. No bowel obstruction. Normal appendix.  Retroperitoneum: No mass or adenopathy.  Peritoneum: No free fluid or gas.  Vascular: No acute abnormality.  OSSEOUS: No acute abnormalities.  IMPRESSION: 1. Mild acute pancreatitis, recommend confirmation with pancreatic enzyme analysis. No fluid collection or necrosis. 2. Cholecystectomy. 3. 16 mm lesion in the upper right kidney which is likely a proteinaceous cyst, although solid mass cannot be excluded. Further evaluation is recommended. Renal ultrasound could be attempted first, although renal protocol MRI or CT may ultimately be required.   Electronically Signed   By: Jorje Guild M.D.   On: 08/29/2013 22:39        Scheduled Meds: . budesonide-formoterol  2 puff Inhalation BID  . enoxaparin (LOVENOX) injection  40 mg Subcutaneous Daily  . metoprolol  2.5 mg Intravenous 4 times per day   Continuous Infusions: . sodium chloride 100 mL/hr at 08/30/13 1058    Principal Problem:   Acute pancreatitis Active Problems:  CAD (coronary artery disease)   Hypertension   Hyperlipidemia    Time spent: 30 minutes    Modena Jansky, MD, FACP, [facility]. [facility] Pager [phone]  If 7PM-7AM, please contact night-coverage [email or web address] Password [email or web address] 08/30/2013, 11:40 AM    LOS: 1 day

## 2013-08-30 NOTE — ED Provider Notes (Signed)
This patient was seen in conjunction with the resident physician. The documentation accurately reflects the patient's encounter in the emergency department, with the following additions:  On my exam the patient was uncomfortable appearing, with epigastric discomfort. Patient with multiple medical problems, including pancreatitis and cancer.  EKG had sinus rate 89, artifact, left axis deviation, abnormal Patient required admission due to concerns for pancreatitis.  Carmin Muskrat, MD 08/30/13 1816

## 2013-08-30 NOTE — H&P (Addendum)
[facility] History and Physical  Brandy Cameron UVO:[id number] DOB: Aug 03, 1935 DOA: 08/29/2013  Referring physician: ER physician. PCP: [name], MD   Chief Complaint: Abdominal pain.  HPI: Brandy Cameron is a 78 y.o. female with history of CAD, hypertension, COPD, lung cancer status post resection, presents to the ER with complaints of abdominal pain. Patient has been having persistent epigastric pain with nausea and vomiting over the last 2 days. Denies any diarrhea. Patient states he has had previous episode of pancreatitis many years ago and cause was unknown. Patient has had recent left knee surgery last month and was on antibiotics until last week for superficial skin infection. In the ER labs reveal elevated lipase and CT abdomen and pelvis done shows features consistent with pancreatitis. Patient has been admitted for further management. Patient denies any chest pain or shortness of breath.   Review of Systems: As presented in the history of presenting illness, rest negative.  Past Medical History  Diagnosis Date  . NSTEMI (non-ST elevated myocardial infarction)     with only mild to moderate CAD in September of 2011,There was concern for apival ballooning. She had hyperdynamic LV function.  Marland Kitchen HTN (hypertension)   . COPD (chronic obstructive pulmonary disease)   . CAD (coronary artery disease)   . Lung cancer     [name]  Marland Kitchen Weight gain 12/17/2011  . Asthma    Past Surgical History  Procedure Laterality Date  . Knee surgery    . Vesicovaginal fistula closure w/ tah    . Gallbladder surgery    . Cataract surgery    . Foot surgery    . Lung lobectomy     Social History:  reports that she has quit smoking. She has never used smokeless tobacco. She reports that she does not drink alcohol. Her drug history is not on file. Where does patient live home. Can patient participate  in ADLs? Yes.  Allergies  Allergen Reactions  . Sulfa Antibiotics Rash  . Sulfonamide Derivatives Rash    Family History:  Family History  Problem Relation Age of Onset  . Heart disease      POSITIVE FAMILY HX. OF   . Pancreatitis      QUESTIONABLE FROM ACE INHIBITORS  . Heart disease Mother 41  . Asthma Mother   . Diabetes Mother   . Cancer Mother       Prior to Admission medications   Medication Sig Start Date End Date Taking? Authorizing Provider  aspirin 81 MG tablet Take 81 mg by mouth daily.     Yes Historical Provider, MD  budesonide-formoterol (SYMBICORT) 160-4.5 MCG/ACT inhaler Inhale 2 puffs into the lungs 2 (two) times daily.   Yes Historical Provider, MD  cephALEXin (KEFLEX) 500 MG capsule Take 500 mg by mouth 3 (three) times daily.   Yes Historical Provider, MD  furosemide (LASIX) 40 MG tablet Take 40 mg by mouth daily.     Yes Historical Provider, MD  HYDROcodone-acetaminophen (NORCO) 10-325 MG per tablet Take 1 tablet by mouth every 6 (six) hours as needed.   Yes Historical Provider, MD  losartan (COZAAR) 100 MG tablet Take 1 tablet (100 mg total) by mouth daily. 11/30/11  Yes Lelon Perla, MD  metoprolol tartrate (LOPRESSOR) 25 MG tablet TAKE 1 TABLET BY MOUTH 2 TIMES DAILY 08/10/12  Yes Lelon Perla, MD  potassium chloride SA (K-DUR,KLOR-CON) 20 MEQ  tablet Take 20 mEq by mouth daily.    Yes Historical Provider, MD  NITROSTAT 0.4 MG SL tablet DISSOLVE 1 TABLET UNDER THE TONGUE EVERY 5 MINUTES AS NEEDED UP TO 3 DOSES 12/10/10   Lelon Perla, MD    Physical Exam: Filed Vitals:   08/29/13 2100 08/29/13 2200 08/29/13 2244 08/29/13 2300  BP: 147/63 158/85 158/85 145/76  Pulse: 91 96 102 104  Temp:      TempSrc:      Resp:   18   SpO2: 94% 94% 93% 93%     General:  Well-developed well-nourished.  Eyes: Anicteric no pallor.  ENT: No discharge from the ears eyes nose mouth.  Neck: No mass felt.  Cardiovascular: S1-S2 heard.  Respiratory: No  rhonchi or crepitations.  Abdomen: Soft nontender bowel sounds present. No guarding or rigidity.  Skin: No rash.  Musculoskeletal: Left leg there is sutures of the left knee from recent surgery. No active discharge seen.  Psychiatric: Appears normal.  Neurologic: Alert awake oriented to time place and person. Moves all extremities.  Labs on Admission:  Basic Metabolic Panel:  Recent Labs Lab 08/29/13 1733  NA 144  K 4.9  CL 102  CO2 30  GLUCOSE 102*  BUN 20  CREATININE 0.89  CALCIUM 9.8   Liver Function Tests:  Recent Labs Lab 08/29/13 1733  AST 22  ALT 44*  ALKPHOS 135*  BILITOT 0.2*  PROT 6.9  ALBUMIN 3.7    Recent Labs Lab 08/29/13 1733  LIPASE 180*   No results found for this basename: AMMONIA,  in the last 168 hours CBC:  Recent Labs Lab 08/29/13 1733  WBC 12.0*  NEUTROABS 8.4*  HGB 14.0  HCT 44.4  MCV 93.7  PLT 299   Cardiac Enzymes: No results found for this basename: CKTOTAL, CKMB, CKMBINDEX, TROPONINI,  in the last 168 hours  BNP (last 3 results) No results found for this basename: PROBNP,  in the last 8760 hours CBG: No results found for this basename: GLUCAP,  in the last 168 hours  Radiological Exams on Admission: Ct Abdomen Pelvis W Contrast  08/29/2013   CLINICAL DATA:  Abdominal pain.  EXAM: CT ABDOMEN AND PELVIS WITH CONTRAST  TECHNIQUE: Multidetector CT imaging of the abdomen and pelvis was performed using the standard protocol following bolus administration of intravenous contrast.  CONTRAST:  194mL OMNIPAQUE IOHEXOL 300 MG/ML  SOLN  COMPARISON:  01/08/2009  FINDINGS: BODY WALL: Fatty epigastric abdominal wall hernia.  LOWER CHEST: RCA atherosclerosis. Stable 7 mm nodule in the right lower lobe, benign based on 5 year stability.  ABDOMEN/PELVIS:  Liver: Mild diffuse fatty infiltration.  No mass lesion.  Biliary: Cholecystectomy.  Pancreas: There is fat infiltration around the midline pancreas. No ductal dilatation or evidence of  mass. No fluid collection or vascular compromise.  Spleen: Unremarkable.  Adrenals: Unremarkable.  Kidneys and ureters: 18 mm cyst in the upper pole left kidney. There is a 16 mm low-density, but not water density, lesion in the upper to interpolar posterior right kidney, appearance similar to partial imaging on chest CT 12/15/2011. No de-enhancement on delayed imaging. No abnormality seen in this region on abdominal ultrasound 01/10/2009. No recent non contrast imaging. Mild relative left renal atrophy without visible high-grade arterial stenosis.  Bladder: Unremarkable.  Reproductive: Hysterectomy.  Bowel: Extensive distal colonic diverticulosis. No bowel obstruction. Normal appendix.  Retroperitoneum: No mass or adenopathy.  Peritoneum: No free fluid or gas.  Vascular: No acute abnormality.  OSSEOUS: No  acute abnormalities.  IMPRESSION: 1. Mild acute pancreatitis, recommend confirmation with pancreatic enzyme analysis. No fluid collection or necrosis. 2. Cholecystectomy. 3. 16 mm lesion in the upper right kidney which is likely a proteinaceous cyst, although solid mass cannot be excluded. Further evaluation is recommended. Renal ultrasound could be attempted first, although renal protocol MRI or CT may ultimately be required.   Electronically Signed   By: Jorje Guild M.D.   On: 08/29/2013 22:39    EKG: Independently reviewed. Normal sinus rhythm with poor R-wave progression.  Assessment/Plan Principal Problem:   Acute pancreatitis Active Problems:   CAD (coronary artery disease)   Hypertension   Hyperlipidemia   1. Acute pancreatitis - cause not clear. Patient has had cholecystectomy previously and does not drink alcohol. Check triglyceride levels. Keep patient n.p.o. for now with pain medications and IV fluids. 2. CAD - denies any chest pain. 3. COPD - not wheezing continue inhalers. 4. Hypertension - for now I have placed patient on scheduled dose of metoprolol. 5. Hyperlipidemia - check  triglyceride levels. See #1. 6. Recent left knee surgery - on DVT prophylaxis. Was recently treated for infection of the superficial skin. 7. Has a right kidney lesion in the CAT scan which will need further workup as outpatient. 8. History of lung cancer.    Code Status: Full code.  Family Communication: Patient's sister at the bedside.  Disposition Plan: Admit to inpatient.    [facility] Pager [phone].  If 7PM-7AM, please contact night-coverage [email or web address] Password [email or web address] 08/30/2013, 12:02 AM

## 2013-08-31 DIAGNOSIS — I10 Essential (primary) hypertension: Secondary | ICD-10-CM

## 2013-08-31 DIAGNOSIS — I2510 Atherosclerotic heart disease of native coronary artery without angina pectoris: Secondary | ICD-10-CM

## 2013-08-31 DIAGNOSIS — E785 Hyperlipidemia, unspecified: Secondary | ICD-10-CM

## 2013-08-31 DIAGNOSIS — K859 Acute pancreatitis without necrosis or infection, unspecified: Secondary | ICD-10-CM | POA: Diagnosis present

## 2013-08-31 DIAGNOSIS — R635 Abnormal weight gain: Secondary | ICD-10-CM

## 2013-08-31 LAB — COMPREHENSIVE METABOLIC PANEL
ALK PHOS: 131 U/L — AB (ref 39–117)
ALT: 48 U/L — AB (ref 0–35)
AST: 27 U/L (ref 0–37)
Albumin: 2.8 g/dL — ABNORMAL LOW (ref 3.5–5.2)
BUN: 11 mg/dL (ref 6–23)
CALCIUM: 9.2 mg/dL (ref 8.4–10.5)
CO2: 30 meq/L (ref 19–32)
Chloride: 101 mEq/L (ref 96–112)
Creatinine, Ser: 0.62 mg/dL (ref 0.50–1.10)
GFR calc Af Amer: >90 mL/min (ref >90)
GFR calc non Af Amer: 85 mL/min — ABNORMAL LOW (ref >90)
GLUCOSE: 114 mg/dL — AB (ref 70–99)
POTASSIUM: 3.8 meq/L (ref 3.7–5.3)
Sodium: 142 mEq/L (ref 137–147)
TOTAL PROTEIN: 6.1 g/dL (ref 6.0–8.3)
Total Bilirubin: 0.4 mg/dL (ref 0.3–1.2)

## 2013-08-31 LAB — GLUCOSE, CAPILLARY
GLUCOSE-CAPILLARY: 124 mg/dL — AB (ref 70–99)
Glucose-Capillary: 105 mg/dL — ABNORMAL HIGH (ref 70–99)
Glucose-Capillary: 106 mg/dL — ABNORMAL HIGH (ref 70–99)

## 2013-08-31 LAB — CBC
HCT: 39.1 % (ref 36.0–46.0)
Hemoglobin: 11.8 g/dL — ABNORMAL LOW (ref 12.0–15.0)
MCH: 29 pg (ref 26.0–34.0)
MCHC: 30.2 g/dL (ref 30.0–36.0)
MCV: 96.1 fL (ref 78.0–100.0)
PLATELETS: 277 10*3/uL (ref 150–400)
RBC: 4.07 MIL/uL (ref 3.87–5.11)
RDW: 14.6 % (ref 11.5–15.5)
WBC: 15.8 10*3/uL — ABNORMAL HIGH (ref 4.0–10.5)

## 2013-08-31 LAB — LIPASE, BLOOD: Lipase: 41 U/L (ref 11–59)

## 2013-08-31 MED ORDER — POLYETHYLENE GLYCOL 3350 17 G PO PACK
17.0000 g | PACK | Freq: Every day | ORAL | Status: DC
  Administered 2013-08-31 – 2013-09-01 (×2): 17 g via ORAL
  Filled 2013-08-31 (×2): qty 1

## 2013-08-31 NOTE — Evaluation (Addendum)
Physical Therapy Evaluation Patient Details Name: Brandy Cameron MRN: [id number] DOB: [birth year] Today's Date: 08/31/2013   History of Present Illness  pt presents with Pancreatitis and recent knee surgery.  Per pt she thinks she had part of her knee replaced in April.    Clinical Impression  Pt very motivated to improve mobility and feels like she has lost some ground after her knee surgery.  Pt is does not seem exactly sure what type of knee surgery she had in April.  When asked if they replaced part of her knee she sates "I think so.".  Pt's O2 sats decrease to 85% on RA during mobility, pt placed back on 2L O2 with sats returning to low 90's.  Will continue to follow while on acute.      Follow Up Recommendations Home health PT;Supervision - Intermittent    Equipment Recommendations  None recommended by PT    Recommendations for Other Services       Precautions / Restrictions Precautions Precautions: Fall Restrictions Other Position/Activity Restrictions: pt had been amb without AD PTA, so assumed WBAT after surgery.        Mobility  Bed Mobility Overal bed mobility: Modified Independent                Transfers Overall transfer level: Needs assistance Equipment used: Rolling walker (2 wheeled) Transfers: Sit to/from Stand Sit to Stand: Supervision         General transfer comment: cues for UE use with coming to stand, but demos good technique with return to sit.    Ambulation/Gait Ambulation/Gait assistance: Min guard Ambulation Distance (Feet): 80 Feet Assistive device: Rolling walker (2 wheeled) Gait Pattern/deviations: Step-through pattern;Decreased stride length;Antalgic     General Gait Details: pt indicates feeling increased stiffness in her L knee and needs UE support on RW for balance at this time.  pt faituges easily and becomes SOB on RA with sats reaching 85% after amb.    Stairs            Wheelchair Mobility    Modified Rankin  (Stroke Patients Only)       Balance                                             Pertinent Vitals/Pain "Stiff"  L knee.  Called for pain meds.      Home Living Family/patient expects to be discharged to:: Private residence Living Arrangements: Alone Available Help at Discharge: Family;Available PRN/intermittently Type of Home: Mobile home Home Access: Stairs to enter Entrance Stairs-Rails: Psychiatric nurse of Steps: 5 Home Layout: One level Home Equipment: Bedside commode;Walker - 2 wheels      Prior Function Level of Independence: Independent         Comments: pt was still working with HHPT, though was no longer using AD.       Hand Dominance   Dominant Hand: Right    Extremity/Trunk Assessment   Upper Extremity Assessment: Defer to OT evaluation           Lower Extremity Assessment: LLE deficits/detail   LLE Deficits / Details: pt with recent presumed partial TKA.  AROM ~0 - 100.    Cervical / Trunk Assessment: Normal  Communication   Communication: No difficulties  Cognition Arousal/Alertness: Awake/alert Behavior During Therapy: WFL for tasks assessed/performed Overall Cognitive Status: Within Functional Limits for  tasks assessed                      General Comments      Exercises Total Joint Exercises Long Arc Quad: AROM;Left;10 reps Knee Flexion: AROM;Left;10 reps      Assessment/Plan    PT Assessment Patient needs continued PT services  PT Diagnosis Difficulty walking   PT Problem List Decreased strength;Decreased range of motion;Decreased activity tolerance;Decreased balance;Decreased mobility;Decreased knowledge of use of DME;Cardiopulmonary status limiting activity  PT Treatment Interventions DME instruction;Gait training;Stair training;Functional mobility training;Therapeutic activities;Therapeutic exercise;Balance training;Patient/family education   PT Goals (Current goals can be found  in the Care Plan section) Acute Rehab PT Goals Patient Stated Goal: Get back on track with knee,   PT Goal Formulation: With patient Time For Goal Achievement: 09/14/13 Potential to Achieve Goals: Good    Frequency Min 3X/week   Barriers to discharge Decreased caregiver support      Co-evaluation               End of Session Equipment Utilized During Treatment: Gait belt Activity Tolerance: Patient tolerated treatment well Patient left: in chair;with call bell/phone within reach;with family/visitor present Nurse Communication: Mobility status         Time: 5747-3403 PT Time Calculation (min): 31 min   Charges:   PT Evaluation $Initial PT Evaluation Tier I: 1 Procedure PT Treatments $Gait Training: 8-22 mins $Therapeutic Exercise: 8-22 mins   PT G CodesCatarina Hartshorn, [place] [phone] 08/31/2013, 1:53 PM

## 2013-08-31 NOTE — Progress Notes (Signed)
[facility] PROGRESS NOTE Interim History: 78 year old female with history of COPD, on nightly home oxygen 2 L per minute, CAD, nuclear stress test 2013: EF 71% and normal perfusion, lung cancer LUL, status post wedge resection and adjuvant radiation therapy with brachytherapy-no evidence of disease recurrence as per her oncologist note 12/17/11, former smoker, HTN, pancreatitis, cholecystectomy, recent left knee surgery, presented with complaints of abdominal pain and nausea. In the ED, CT abdomen suggests mild pancreatitis and lipase is mildly elevated.    Assessment/Plan: Acute pancreatitis - Unclear etiology. No history of alcohol use. No gallstones were reported on CT abdomen.  - Recently treated with Keflex. No history of insect bites. Triglycerides with in normal.  - No complicating features on CT abdomen. - cont PPI, try liq diet.  COPD/chronic respiratory failure:  - Mild wheezing. When necessary bronchodilators. Continue oxygen. Monitor closely.  - We'll minimize pain medications. Continuous pulse oximetry. We'll get chest x-ray   Hypertension:  - Controlled. Change metoprolol to by mouth. Hold Cozaar.  History of CAD:  - Asymptomatic of chest pain. Continue aspirin and metoprolol.  History of lung cancer:  - Apparently in remission. Outpatient follow up  Recent left knee surgery:  - Completed Keflex treatment on 5/2 for superficial skin infection. No acute features at this time. PT and OT evaluation. Surgeon in [place].  Right kidney lesion on CT abdomen: - ? Proteinaceous cyst? Solid mass. As per radiologist's recommendation, will obtain a renal ultrasound but ultimately may need renal protocol MRI or CT to further delineate.    Code Status: full Family Communication: discussed with patient's son [name].  Disposition Plan: Remains inpatient. Home when medically  stable.    Consultants:  none  Procedures:  none  Antibiotics:  None  HPI/Subjective: No complains except hungry  Objective: Filed Vitals:   08/31/13 0159 08/31/13 0451 08/31/13 0932 08/31/13 1200  BP:  125/49 124/52   Pulse:  106 101   Temp:  99.7 F (37.6 C)    TempSrc:  Oral    Resp:  18    SpO2: 96% 92%  94%    Intake/Output Summary (Last 24 hours) at 08/31/13 1311 Last data filed at 08/31/13 0808  Gross per 24 hour  Intake    875 ml  Output    150 ml  Net    725 ml   There were no vitals filed for this visit.  Exam:  General: Alert, awake, oriented x3, in no acute distress.  HEENT: No bruits, no goiter.  Heart: Regular rate and rhythm, without murmurs, rubs, gallops.  Lungs: Good air movement, clear Abdomen: Soft, nontender, nondistended, positive bowel sounds.    Data Reviewed: Basic Metabolic Panel:  Recent Labs Lab 08/29/13 1733 08/30/13 0550 08/31/13 0619  NA 144 141 142  K 4.9 4.7 3.8  CL 102 103 101  CO2 30 23 30   GLUCOSE 102* 117* 114*  BUN 20 14 11   CREATININE 0.89 0.68 0.62  CALCIUM 9.8 9.1 9.2   Liver Function Tests:  Recent Labs Lab 08/29/13 1733 08/30/13 0550 08/31/13 0619  AST 22 46* 27  ALT 44* 62* 48*  ALKPHOS 135* 130* 131*  BILITOT 0.2* 0.6 0.4  PROT 6.9 6.1 6.1  ALBUMIN 3.7 3.1* 2.8*    Recent Labs Lab 08/29/13 1733 08/30/13 0550 08/31/13 0619  LIPASE 180* 113* 41   No results found for this basename: AMMONIA,  in the last 168 hours CBC:  Recent Labs Lab 08/29/13 1733 08/30/13  0550 08/31/13 0619  WBC 12.0* 12.7* 15.8*  NEUTROABS 8.4* 9.7*  --   HGB 14.0 12.6 11.8*  HCT 44.4 41.0 39.1  MCV 93.7 94.3 96.1  PLT 299 272 277   Cardiac Enzymes:  Recent Labs Lab 08/30/13 0550  TROPONINI <0.30   BNP (last 3 results) No results found for this basename: PROBNP,  in the last 8760 hours CBG:  Recent Labs Lab 08/30/13 1059 08/30/13 1909 08/31/13 0128 08/31/13 0625 08/31/13 1144  GLUCAP  110* 119* 105* 124* 106*    No results found for this or any previous visit (from the past 240 hour(s)).   Studies: US Abdomen Complete  08/30/2013   CLINICAL DATA:  Right renal mass  EXAM: ULTRASOUND ABDOMEN COMPLETE  COMPARISON:  None.  FINDINGS: Gallbladder:  Prior cholecystectomy.  Common bile duct:  Diameter: 8.5 mm. Minimal intrahepatic biliary ductal dilatation likely related to postcholecystectomy state.  Liver:  No focal hepatic lesion. Echogenic hepatic echotexture as can be seen with hepatic steatosis.  IVC:  No abnormality visualized.  Pancreas:  Suboptimally visualized secondary to overlying bowel gas.  Spleen:  Size and appearance within normal limits.  Right Kidney:  Length: 12.6. There is no hydronephrosis. There is a hypoechoic 6 mm right interpolar renal mass which is anechoic with increased through transmission likely representing a small cyst. The medial interpolar mass is not well delineated.  Left Kidney:  Length: 10.5 cm. Echogenicity within normal limits. There is no hydronephrosis. There is a 17 mm left upper pole anechoic mass with increased through transmission most consistent with a cyst.  Abdominal aorta:  No aneurysm visualized.  Other findings:  None.  IMPRESSION: 1. The medial interpolar mass seen on CT is not visualized by ultrasound. Renal protocol MRI of the abdomen is recommended for further characterization. 2. The pancreas is suboptimally visualized secondary to overlying bowel gas.   Electronically Signed   By: Kathreen Devoid   On: 08/30/2013 19:11   Ct Abdomen Pelvis W Contrast  08/29/2013   CLINICAL DATA:  Abdominal pain.  EXAM: CT ABDOMEN AND PELVIS WITH CONTRAST  TECHNIQUE: Multidetector CT imaging of the abdomen and pelvis was performed using the standard protocol following bolus administration of intravenous contrast.  CONTRAST:  153mL OMNIPAQUE IOHEXOL 300 MG/ML  SOLN  COMPARISON:  01/08/2009  FINDINGS: BODY WALL: Fatty epigastric abdominal wall hernia.  LOWER  CHEST: RCA atherosclerosis. Stable 7 mm nodule in the right lower lobe, benign based on 5 year stability.  ABDOMEN/PELVIS:  Liver: Mild diffuse fatty infiltration.  No mass lesion.  Biliary: Cholecystectomy.  Pancreas: There is fat infiltration around the midline pancreas. No ductal dilatation or evidence of mass. No fluid collection or vascular compromise.  Spleen: Unremarkable.  Adrenals: Unremarkable.  Kidneys and ureters: 18 mm cyst in the upper pole left kidney. There is a 16 mm low-density, but not water density, lesion in the upper to interpolar posterior right kidney, appearance similar to partial imaging on chest CT 12/15/2011. No de-enhancement on delayed imaging. No abnormality seen in this region on abdominal ultrasound 01/10/2009. No recent non contrast imaging. Mild relative left renal atrophy without visible high-grade arterial stenosis.  Bladder: Unremarkable.  Reproductive: Hysterectomy.  Bowel: Extensive distal colonic diverticulosis. No bowel obstruction. Normal appendix.  Retroperitoneum: No mass or adenopathy.  Peritoneum: No free fluid or gas.  Vascular: No acute abnormality.  OSSEOUS: No acute abnormalities.  IMPRESSION: 1. Mild acute pancreatitis, recommend confirmation with pancreatic enzyme analysis. No fluid collection or necrosis. 2. Cholecystectomy.  3. 16 mm lesion in the upper right kidney which is likely a proteinaceous cyst, although solid mass cannot be excluded. Further evaluation is recommended. Renal ultrasound could be attempted first, although renal protocol MRI or CT may ultimately be required.   Electronically Signed   By: Jorje Guild M.D.   On: 08/29/2013 22:39   Dg Chest Port 1 View  08/30/2013   CLINICAL DATA:  Dyspnea on exertion.  COPD.  EXAM: PORTABLE CHEST - 1 VIEW  COMPARISON:  05/14/2013.  01/30/2010  FINDINGS: Single view of the chest was obtained. Post treatment changes are again noted in the left upper chest. Again noted is volume loss in the left hemithorax.  The vascular and interstitial markings are slightly prominent but similar to the previous examination. There may be slightly increased densities in left mid lung. Heart and mediastinum are grossly changed.  IMPRESSION: Post treatment changes in the left upper lung with volume loss in the left hemithorax. Slightly increased densities in the left lung may represent volume loss.  Prominent interstitial lung markings suggest chronic changes. Vascular congestion or mild edema cannot be excluded.  The lungs may be better characterized with a two view chest examination.   Electronically Signed   By: Markus Daft M.D.   On: 08/30/2013 14:19    Scheduled Meds: . albuterol  2.5 mg Nebulization Q6H  . aspirin EC  81 mg Oral Daily  . budesonide-formoterol  2 puff Inhalation BID  . enoxaparin (LOVENOX) injection  40 mg Subcutaneous Daily  . furosemide  40 mg Oral Daily  . metoprolol tartrate  25 mg Oral BID  . pantoprazole  40 mg Oral Daily  . polyethylene glycol  17 g Oral Daily  . potassium chloride SA  20 mEq Oral Daily   Continuous Infusions:    [facility] Pager [phone]. If 8PM-8AM, please contact night-coverage at [email or web address], password [facility] 08/31/2013, 1:11 PM  LOS: 2 days

## 2013-08-31 NOTE — Evaluation (Signed)
Occupational Therapy Evaluation Patient Details Name: Brandy Cameron MRN: [id number] DOB: [birth year] Today's Date: 08/31/2013    History of Present Illness pt presents with Pancreatitis and recent knee surgery.  Per pt she thinks she had part of her knee replaced in April.     Clinical Impression   Pt is at sup level with ADLs and ADL mobility. No further acute OT indicated at this time, pt to continue with acute PT services for functional mobility safety. OT will sign off    Follow Up Recommendations  No OT follow up;Supervision - Intermittent    Equipment Recommendations  None recommended by OT    Recommendations for Other Services       Precautions / Restrictions Precautions Precautions: Fall Restrictions Weight Bearing Restrictions: No Other Position/Activity Restrictions: pt had been amb without AD PTA, so assumed WBAT after surgery.        Mobility Bed Mobility Overal bed mobility: Modified Independent                Transfers Overall transfer level: Needs assistance Equipment used: Rolling walker (2 wheeled) Transfers: Sit to/from Stand Sit to Stand: Supervision         General transfer comment: cues for UE use with coming to stand, but demos good technique with return to sit.      Balance Overall balance assessment: No apparent balance deficits (not formally assessed)                                          ADL Overall ADL's : At baseline                                       General ADL Comments: pt was sup with ADLs seated and sit - stand/standing, sup with toilet and shower transfers. able to stand at sink for groomng and hygiene without LOB     Vision  wears reading glasses                              Pertinent Vitals/Pain 1/10 c/o abdominal discomfort, VSS     Hand Dominance Right   Extremity/Trunk Assessment Upper Extremity Assessment Upper Extremity Assessment: Overall WFL for  tasks assessed   Lower Extremity Assessment Lower Extremity Assessment: Defer to PT evaluation LLE Deficits / Details: pt with recent presumed partial TKA.  AROM ~0 - 100.     Cervical / Trunk Assessment Cervical / Trunk Assessment: Normal   Communication Communication Communication: No difficulties   Cognition Arousal/Alertness: Awake/alert Behavior During Therapy: WFL for tasks assessed/performed Overall Cognitive Status: Within Functional Limits for tasks assessed                     General Comments   Pt pleasant and cooperative             Home Living Family/patient expects to be discharged to:: Private residence Living Arrangements: Alone Available Help at Discharge: Family;Available PRN/intermittently Type of Home: Mobile home Home Access: Stairs to enter Entrance Stairs-Number of Steps: 5 Entrance Stairs-Rails: Right;Left Home Layout: One level     Bathroom Shower/Tub: Occupational psychologist: Standard     Home Equipment: Bedside commode;Walker - 2 wheels  Prior Functioning/Environment Level of Independence: Independent        Comments: pt was still working with HHPT, though was no longer using AD.      OT Diagnosis:     OT Problem List:     OT Treatment/Interventions:      OT Goals(Current goals can be found in the care plan section) Acute Rehab OT Goals Patient Stated Goal: get better and go home  OT Frequency:     Barriers to D/C:  none                        End of Session Equipment Utilized During Treatment: Rolling walker;Oxygen  Activity Tolerance: Patient tolerated treatment well Patient left: in bed;with call bell/phone within reach   Time: 1319-1340 OT Time Calculation (min): 21 min Charges:  OT General Charges $OT Visit: 1 Procedure OT Evaluation $Initial OT Evaluation Tier I: 1 Procedure OT Treatments $Therapeutic Activity: 8-22 mins G-Codes:    Mosetta Putt 09/04/13, 2:10  PM

## 2013-09-01 ENCOUNTER — Inpatient Hospital Stay (HOSPITAL_COMMUNITY): Payer: Medicare Other

## 2013-09-01 LAB — GLUCOSE, CAPILLARY
GLUCOSE-CAPILLARY: 93 mg/dL (ref 70–99)
Glucose-Capillary: 105 mg/dL — ABNORMAL HIGH (ref 70–99)
Glucose-Capillary: 95 mg/dL (ref 70–99)

## 2013-09-01 MED ORDER — ALBUTEROL SULFATE (2.5 MG/3ML) 0.083% IN NEBU: 2.5000 mg | INHALATION_SOLUTION | RESPIRATORY_TRACT | Status: DC | PRN

## 2013-09-01 NOTE — Progress Notes (Signed)
RT note: Patient is refusing all neb treatments. Stated she would call if she needed them. Assessment done, and patient changed to PRN.

## 2013-09-01 NOTE — Discharge Summary (Signed)
Physician Discharge Summary  Brandy Cameron ZOX:[id number] DOB: 25-Jul-1935 DOA: 08/29/2013  PCP: [name], MD  Admit date: 08/29/2013 Discharge date: 09/01/2013  Time spent: 35 minutes  Recommendations for Outpatient Follow-up:  1. Follow up with PCP in 2 weeks.  Discharge Diagnoses:  Principal Problem:   Acute pancreatitis Active Problems:   CAD (coronary artery disease)   Hypertension   Hyperlipidemia   Pancreatitis, acute   Discharge Condition: stable  Diet recommendation: regular  There were no vitals filed for this visit.  History of present illness:  78 y.o. female with history of CAD, hypertension, COPD, lung cancer status post resection, presents to the ER with complaints of abdominal pain. Patient has been having persistent epigastric pain with nausea and vomiting over the last 2 days. Denies any diarrhea. Patient states he has had previous episode of pancreatitis many years ago and cause was unknown. Patient has had recent left knee surgery last month and was on antibiotics until last week for superficial skin infection. In the ER labs reveal elevated lipase and CT abdomen and pelvis done shows features consistent with pancreatitis. Patient has been admitted for further management. Patient denies any chest pain or shortness of breath.    Hospital Course:  Acute pancreatitis  - Unclear etiology. No history of alcohol use. No gallstones were reported on CT abdomen.  - Recently treated with Keflex. No history of insect bites. Triglycerides with in normal.  - No complicating features on CT abdomen.  - cont PPI.  COPD/chronic respiratory failure:  - Mild wheezing. When necessary bronchodilators. Continue oxygen. Monitor closely.  - We'll minimize pain medications. Continuous pulse oximetry. We'll get chest x-ray   Hypertension:  - Controlled. Change metoprolol to by mouth. Held Cozaar.  - resume as an outpatient.  History of CAD:  - Asymptomatic of chest pain.  Continue aspirin and metoprolol.   History of lung cancer:  - Apparently in remission. Outpatient follow up   Recent left knee surgery:  - Completed Keflex treatment on 5/2 for superficial skin infection. No acute features at this time. PT and OT evaluation. Surgeon in [place].   Right kidney lesion on CT abdomen:  - ? Proteinaceous cyst? Solid mass. As per radiologist's recommendation, will obtain a renal ultrasound but ultimately may need renal protocol MRI showed benign B/L renal cyst.   Procedures:  Ct abd  Renal US   MRI of abdomen  Consultations:  none  Discharge Exam: Filed Vitals:   09/01/13 1406  BP: 121/52  Pulse: 94  Temp: 97.4 F (36.3 C)  Resp: 16    General: A&O x3 Cardiovascular: RRR Respiratory: good air movement CTA B/L  Discharge Instructions You were cared for by a hospitalist during your hospital stay. If you have any questions about your discharge medications or the care you received while you were in the hospital after you are discharged, you can call the unit and asked to speak with the hospitalist on call if the hospitalist that took care of you is not available. Once you are discharged, your primary care physician will handle any further medical issues. Please note that NO REFILLS for any discharge medications will be authorized once you are discharged, as it is imperative that you return to your primary care physician (or establish a relationship with a primary care physician if you do not have one) for your aftercare needs so that they can reassess your need for medications and monitor your lab values.      Discharge  Orders   Future Orders Complete By Expires   Diet - low sodium heart healthy  As directed    Increase activity slowly  As directed        Medication List    STOP taking these medications       cephALEXin 500 MG capsule  Commonly known as:  KEFLEX      TAKE these medications       aspirin 81 MG tablet  Take 81  mg by mouth daily.     budesonide-formoterol 160-4.5 MCG/ACT inhaler  Commonly known as:  SYMBICORT  Inhale 2 puffs into the lungs 2 (two) times daily.     furosemide 40 MG tablet  Commonly known as:  LASIX  Take 40 mg by mouth daily.     HYDROcodone-acetaminophen 10-325 MG per tablet  Commonly known as:  NORCO  Take 1 tablet by mouth every 6 (six) hours as needed.     losartan 100 MG tablet  Commonly known as:  COZAAR  Take 1 tablet (100 mg total) by mouth daily.     metoprolol tartrate 25 MG tablet  Commonly known as:  LOPRESSOR  TAKE 1 TABLET BY MOUTH 2 TIMES DAILY     NITROSTAT 0.4 MG SL tablet  Generic drug:  nitroGLYCERIN  DISSOLVE 1 TABLET UNDER THE TONGUE EVERY 5 MINUTES AS NEEDED UP TO 3 DOSES     potassium chloride SA 20 MEQ tablet  Commonly known as:  K-DUR,KLOR-CON  Take 20 mEq by mouth daily.       Allergies  Allergen Reactions  . Sulfa Antibiotics Rash  . Sulfonamide Derivatives Rash   Follow-up Information   Follow up with [name], MD In 1 week. (hospital follow up)    Specialty:  Family Medicine   Contact information:   [address] [place] [address] [phone]        The results of significant diagnostics from this hospitalization (including imaging, microbiology, ancillary and laboratory) are listed below for reference.    Significant Diagnostic Studies: US Abdomen Complete  08/30/2013   CLINICAL DATA:  Right renal mass  EXAM: ULTRASOUND ABDOMEN COMPLETE  COMPARISON:  None.  FINDINGS: Gallbladder:  Prior cholecystectomy.  Common bile duct:  Diameter: 8.5 mm. Minimal intrahepatic biliary ductal dilatation likely related to postcholecystectomy state.  Liver:  No focal hepatic lesion. Echogenic hepatic echotexture as can be seen with hepatic steatosis.  IVC:  No abnormality visualized.  Pancreas:  Suboptimally visualized secondary to overlying bowel gas.  Spleen:  Size and appearance within normal limits.  Right Kidney:  Length:  12.6. There is no hydronephrosis. There is a hypoechoic 6 mm right interpolar renal mass which is anechoic with increased through transmission likely representing a small cyst. The medial interpolar mass is not well delineated.  Left Kidney:  Length: 10.5 cm. Echogenicity within normal limits. There is no hydronephrosis. There is a 17 mm left upper pole anechoic mass with increased through transmission most consistent with a cyst.  Abdominal aorta:  No aneurysm visualized.  Other findings:  None.  IMPRESSION: 1. The medial interpolar mass seen on CT is not visualized by ultrasound. Renal protocol MRI of the abdomen is recommended for further characterization. 2. The pancreas is suboptimally visualized secondary to overlying bowel gas.   Electronically Signed   By: Kathreen Devoid   On: 08/30/2013 19:11   Ct Abdomen Pelvis W Contrast  08/29/2013   CLINICAL DATA:  Abdominal pain.  EXAM: CT ABDOMEN AND  PELVIS WITH CONTRAST  TECHNIQUE: Multidetector CT imaging of the abdomen and pelvis was performed using the standard protocol following bolus administration of intravenous contrast.  CONTRAST:  120mL OMNIPAQUE IOHEXOL 300 MG/ML  SOLN  COMPARISON:  01/08/2009  FINDINGS: BODY WALL: Fatty epigastric abdominal wall hernia.  LOWER CHEST: RCA atherosclerosis. Stable 7 mm nodule in the right lower lobe, benign based on 5 year stability.  ABDOMEN/PELVIS:  Liver: Mild diffuse fatty infiltration.  No mass lesion.  Biliary: Cholecystectomy.  Pancreas: There is fat infiltration around the midline pancreas. No ductal dilatation or evidence of mass. No fluid collection or vascular compromise.  Spleen: Unremarkable.  Adrenals: Unremarkable.  Kidneys and ureters: 18 mm cyst in the upper pole left kidney. There is a 16 mm low-density, but not water density, lesion in the upper to interpolar posterior right kidney, appearance similar to partial imaging on chest CT 12/15/2011. No de-enhancement on delayed imaging. No abnormality seen in  this region on abdominal ultrasound 01/10/2009. No recent non contrast imaging. Mild relative left renal atrophy without visible high-grade arterial stenosis.  Bladder: Unremarkable.  Reproductive: Hysterectomy.  Bowel: Extensive distal colonic diverticulosis. No bowel obstruction. Normal appendix.  Retroperitoneum: No mass or adenopathy.  Peritoneum: No free fluid or gas.  Vascular: No acute abnormality.  OSSEOUS: No acute abnormalities.  IMPRESSION: 1. Mild acute pancreatitis, recommend confirmation with pancreatic enzyme analysis. No fluid collection or necrosis. 2. Cholecystectomy. 3. 16 mm lesion in the upper right kidney which is likely a proteinaceous cyst, although solid mass cannot be excluded. Further evaluation is recommended. Renal ultrasound could be attempted first, although renal protocol MRI or CT may ultimately be required.   Electronically Signed   By: Jorje Guild M.D.   On: 08/29/2013 22:39   Dg Chest Port 1 View  08/30/2013   CLINICAL DATA:  Dyspnea on exertion.  COPD.  EXAM: PORTABLE CHEST - 1 VIEW  COMPARISON:  05/14/2013.  01/30/2010  FINDINGS: Single view of the chest was obtained. Post treatment changes are again noted in the left upper chest. Again noted is volume loss in the left hemithorax. The vascular and interstitial markings are slightly prominent but similar to the previous examination. There may be slightly increased densities in left mid lung. Heart and mediastinum are grossly changed.  IMPRESSION: Post treatment changes in the left upper lung with volume loss in the left hemithorax. Slightly increased densities in the left lung may represent volume loss.  Prominent interstitial lung markings suggest chronic changes. Vascular congestion or mild edema cannot be excluded.  The lungs may be better characterized with a two view chest examination.   Electronically Signed   By: Markus Daft M.D.   On: 08/30/2013 14:19    Microbiology: No results found for this or any previous  visit (from the past 240 hour(s)).   Labs: Basic Metabolic Panel:  Recent Labs Lab 08/29/13 1733 08/30/13 0550 08/31/13 0619  NA 144 141 142  K 4.9 4.7 3.8  CL 102 103 101  CO2 30 23 30   GLUCOSE 102* 117* 114*  BUN 20 14 11   CREATININE 0.89 0.68 0.62  CALCIUM 9.8 9.1 9.2   Liver Function Tests:  Recent Labs Lab 08/29/13 1733 08/30/13 0550 08/31/13 0619  AST 22 46* 27  ALT 44* 62* 48*  ALKPHOS 135* 130* 131*  BILITOT 0.2* 0.6 0.4  PROT 6.9 6.1 6.1  ALBUMIN 3.7 3.1* 2.8*    Recent Labs Lab 08/29/13 1733 08/30/13 0550 08/31/13 0619  LIPASE 180* 113*  41   No results found for this basename: AMMONIA,  in the last 168 hours CBC:  Recent Labs Lab 08/29/13 1733 08/30/13 0550 08/31/13 0619  WBC 12.0* 12.7* 15.8*  NEUTROABS 8.4* 9.7*  --   HGB 14.0 12.6 11.8*  HCT 44.4 41.0 39.1  MCV 93.7 94.3 96.1  PLT 299 272 277   Cardiac Enzymes:  Recent Labs Lab 08/30/13 0550  TROPONINI <0.30   BNP: BNP (last 3 results) No results found for this basename: PROBNP,  in the last 8760 hours CBG:  Recent Labs Lab 08/31/13 0625 08/31/13 1144 09/01/13 0039 09/01/13 0620 09/01/13 1116  GLUCAP 124* 106* 105* 93 95       Signed:  Charlynne Cousins  [facility] 09/01/2013, 3:30 PM

## 2013-10-01 ENCOUNTER — Emergency Department (INDEPENDENT_AMBULATORY_CARE_PROVIDER_SITE_OTHER): Payer: Medicare Other

## 2013-10-01 ENCOUNTER — Encounter: Payer: Self-pay | Admitting: Emergency Medicine

## 2013-10-01 ENCOUNTER — Emergency Department
Admission: EM | Admit: 2013-10-01 | Discharge: 2013-10-01 | Disposition: A | Discharge status: Home / Self Care | Payer: Medicare Other | Source: Home / Self Care | Attending: Family Medicine | Admitting: Family Medicine

## 2013-10-01 DIAGNOSIS — R0602 Shortness of breath: Secondary | ICD-10-CM

## 2013-10-01 DIAGNOSIS — Z9889 Other specified postprocedural states: Secondary | ICD-10-CM

## 2013-10-01 DIAGNOSIS — J209 Acute bronchitis, unspecified: Secondary | ICD-10-CM

## 2013-10-01 LAB — POCT CBC W AUTO DIFF (K'VILLE URGENT CARE)

## 2013-10-01 MED ORDER — BENZONATATE 200 MG PO CAPS: 200.0000 mg | ORAL_CAPSULE | Freq: Every day | ORAL | Status: DC

## 2013-10-01 MED ORDER — PREDNISONE 20 MG PO TABS: 20.0000 mg | ORAL_TABLET | Freq: Two times a day (BID) | ORAL | Status: DC

## 2013-10-01 MED ORDER — METHYLPREDNISOLONE SODIUM SUCC 125 MG IJ SOLR
80.0000 mg | Freq: Once | INTRAMUSCULAR | Status: AC
  Administered 2013-10-01: 80 mg via INTRAMUSCULAR

## 2013-10-01 NOTE — Discharge Instructions (Signed)
Continue amoxicillin.  Begin prednisone tomorrow (Tuesday). Take plain Mucinex (1200 mg guaifenesin) twice daily for cough and congestion.   Increase fluid intake, rest. May use Afrin nasal spray (or generic oxymetazoline) twice daily for about 5 days.  Also recommend using saline nasal spray several times daily and saline nasal irrigation (AYR is a common brand) Continue all inhalers, and oxygen at bedtime. Stop all antihistamines for now, and other non-prescription cough/cold preparations. Recommend below-the-knee support hose daytime (mild compression:  8-76mmHg at the ankle) If symptoms become significantly worse during the night or over the weekend, proceed to the local emergency room.

## 2013-10-01 NOTE — ED Notes (Signed)
Pt c/o SOB with URI s/s x 4 days. She saw her PCP last Thursday was given ABT and cough syrup. She states that she would like "a shot".

## 2013-10-01 NOTE — ED Provider Notes (Signed)
CSN: [id number]     Arrival date & time 10/01/13  1124 History   First MD Initiated Contact with Patient 10/01/13 1149     Chief Complaint  Patient presents with  . Shortness of Breath      HPI Comments: Patient developed chills/sweats about a week ago, and then developed URI symptoms four days ago.  She visited her PCP at that time who started her on amoxicillin and a cough suppressant.  She has had persistent wheezing and shortness of breath.  She has COPD and uses Sybicort and nocturnal O2.  She had a partial left knee replacement recently and complains of persistent left lower leg edema without pain.  The history is provided by the patient.    Past Medical History  Diagnosis Date  . NSTEMI (non-ST elevated myocardial infarction)     with only mild to moderate CAD in September of 2011,There was concern for apival ballooning. She had hyperdynamic LV function.  Marland Kitchen HTN (hypertension)   . COPD (chronic obstructive pulmonary disease)   . CAD (coronary artery disease)   . Lung cancer     [name]  Marland Kitchen Weight gain 12/17/2011  . Asthma    Past Surgical History  Procedure Laterality Date  . Knee surgery    . Vesicovaginal fistula closure w/ tah    . Gallbladder surgery    . Cataract surgery    . Foot surgery    . Lung lobectomy     Family History  Problem Relation Age of Onset  . Heart disease      POSITIVE FAMILY HX. OF   . Pancreatitis      QUESTIONABLE FROM ACE INHIBITORS  . Heart disease Mother 59  . Asthma Mother   . Diabetes Mother   . Cancer Mother    History  Substance Use Topics  . Smoking status: Former Research scientist (life sciences)  . Smokeless tobacco: Never Used     Comment: She stopped smoking four years ago.  . Alcohol Use: No   OB History   Grav Para Term Preterm Abortions TAB SAB Ect Mult Living                 Review of Systems No sore throat + cough No pleuritic pain + wheezing + nasal  congestion + post-nasal drainage No sinus pain/pressure No itchy/red eyes ? earache No hemoptysis + SOB No fever, + chills No nausea No vomiting No abdominal pain No diarrhea No urinary symptoms No skin rash + fatigue No myalgias No headache Used OTC meds without relief  Allergies  Sulfa antibiotics and Sulfonamide derivatives  Home Medications   Prior to Admission medications   Medication Sig Start Date End Date Taking? Authorizing Provider  amoxicillin (AMOXIL) 500 MG capsule Take 500 mg by mouth 2 (two) times daily.   Yes Historical Provider, MD  UNKNOWN TO PATIENT    Yes Historical Provider, MD  aspirin 81 MG tablet Take 81 mg by mouth daily.      Historical Provider, MD  benzonatate (TESSALON) 200 MG capsule Take 1 capsule (200 mg total) by mouth at bedtime. Take as needed for cough 10/01/13   Kandra Nicolas, MD  budesonide-formoterol [facility]) 160-4.5 MCG/ACT inhaler Inhale 2 puffs into the lungs 2 (two) times daily.    Historical Provider, MD  furosemide (LASIX) 40 MG tablet Take 40 mg by mouth daily.      Historical  Provider, MD  HYDROcodone-acetaminophen (NORCO) 10-325 MG per tablet Take 1 tablet by mouth every 6 (six) hours as needed.    Historical Provider, MD  losartan (COZAAR) 100 MG tablet Take 1 tablet (100 mg total) by mouth daily. 11/30/11   Lelon Perla, MD  metoprolol tartrate (LOPRESSOR) 25 MG tablet TAKE 1 TABLET BY MOUTH 2 TIMES DAILY 08/10/12   Lelon Perla, MD  NITROSTAT 0.4 MG SL tablet DISSOLVE 1 TABLET UNDER THE TONGUE EVERY 5 MINUTES AS NEEDED UP TO 3 DOSES 12/10/10   Lelon Perla, MD  potassium chloride SA (K-DUR,KLOR-CON) 20 MEQ tablet Take 20 mEq by mouth daily.     Historical Provider, MD  predniSONE (DELTASONE) 20 MG tablet Take 1 tablet (20 mg total) by mouth 2 (two) times daily. Take with food. 10/01/13   Kandra Nicolas, MD   BP 149/86  Pulse 93  Temp(Src) 97.5 F (36.4 C) (Oral)  Resp 20  Wt 201 lb (91.173 kg)  SpO2 94% Physical  Exam Nursing notes and Vital Signs reviewed. Appearance:  Patient appears stated age, and in no acute distress.  She is obese. Eyes:  Pupils are equal, round, and reactive to light and accomodation.  Extraocular movement is intact.  Conjunctivae are not inflamed  Ears:  Canals normal.  Tympanic membranes normal.  Nose:  Mildly congested turbinates.  No sinus tenderness.   Pharynx:  Normal Neck:  Supple.   Enlarged posterior nodes are palpated bilaterally  Lungs:   Diffuse wheezes and rhonchi all fields; no rales.  Breath sounds are equal.  Heart:  Regular rate and rhythm without murmurs, rubs, or gallops.  Abdomen:  Nontender without masses or hepatosplenomegaly.  Bowel sounds are present.  No CVA or flank tenderness.  Extremities:   Edema left lower leg.  No calf tenderness Skin:  No rash present.   ED Course  Procedures  none    Labs Reviewed  POCT CBC W AUTO DIFF ([facility]):  WBC 6.2; LY 28.0; MO 5.8; GR 66.2; Hgb 14.1; Platelets 233     Imaging Review Dg Chest 2 View  10/01/2013   CLINICAL DATA:  Shortness of breath  EXAM: CHEST  2 VIEW  COMPARISON:  08/30/2013  FINDINGS: Postsurgical changes are again noted in the left lung. Mild volume loss is seen. Right lung remains clear. No acute bony abnormality is noted. The cardiac shadow is stable.  IMPRESSION: Postoperative changes without acute abnormality.   Electronically Signed   By: Inez Catalina M.D.   On: 10/01/2013 12:39     MDM   1. Bronchitis with bronchospasm    Solumedrol 80mg  IM.  Begin prednisone burst tomorrow. Continue amoxicillin.  Begin prednisone tomorrow (Tuesday). Take plain Mucinex (1200 mg guaifenesin) twice daily for cough and congestion.   Increase fluid intake, rest. May use Afrin nasal spray (or generic oxymetazoline) twice daily for about 5 days.  Also recommend using saline nasal spray several times daily and saline nasal irrigation (AYR is a common brand) Continue all inhalers, and oxygen at  bedtime. Stop all antihistamines for now, and other non-prescription cough/cold preparations. Recommend below-the-knee support hose daytime (mild compression:  8-56mmHg at the ankle) Followup with PCP if not improved 4 days. If symptoms become significantly worse during the night or over the weekend, proceed to the local emergency room.     Kandra Nicolas, MD 10/03/13 2110

## 2013-10-05 ENCOUNTER — Telehealth: Payer: Self-pay | Admitting: Emergency Medicine

## 2013-10-05 NOTE — ED Notes (Signed)
Inquired about patient's status; encourage them to call with questions/concerns.  

## 2013-10-24 ENCOUNTER — Telehealth: Payer: Self-pay | Admitting: Cardiology

## 2013-10-24 NOTE — Telephone Encounter (Signed)
New problem    Pt having feet and leg swelling about 3-4 days. Please call pt.

## 2013-10-24 NOTE — Telephone Encounter (Signed)
Spoke with pt, for about one week now she has swelling in her feet. It is there in the morning when she gets up  She is SOB with walking through the house and up the stairs. She is SOB when lying flat, the 02 she wears at night does help some. She does not weigh She currently takes furosemide 40 mg once daily. She was also taken off her simvastatin by her PCP for muscle cramps, the cramps did not change. Her bp yesterday was 178/98 Per home health, she has too much protein in her urine and needs another statin. Will forward for dr Stanford Breed review

## 2013-10-24 NOTE — Telephone Encounter (Signed)
Low NA diet; change lasix to 40 BID for 3 days and then resume 40 daily; bmet and BNP in one week /Brian Stanford Breed

## 2013-10-24 NOTE — Telephone Encounter (Signed)
Spoke with pt, Aware of dr Jacalyn Lefevre recommendations.  She has a follow up appt Monday and will check labs at that time. Patient voiced understanding of medication change.

## 2013-10-29 ENCOUNTER — Encounter: Payer: Self-pay | Admitting: Cardiology

## 2013-10-29 ENCOUNTER — Ambulatory Visit (INDEPENDENT_AMBULATORY_CARE_PROVIDER_SITE_OTHER): Payer: Medicare Other | Admitting: Cardiology

## 2013-10-29 ENCOUNTER — Encounter: Payer: Self-pay | Admitting: *Deleted

## 2013-10-29 VITALS — BP 130/60 | HR 70 | Ht 61.0 in | Wt 202.2 lb

## 2013-10-29 DIAGNOSIS — I10 Essential (primary) hypertension: Secondary | ICD-10-CM

## 2013-10-29 DIAGNOSIS — I509 Heart failure, unspecified: Secondary | ICD-10-CM

## 2013-10-29 DIAGNOSIS — I5031 Acute diastolic (congestive) heart failure: Secondary | ICD-10-CM

## 2013-10-29 DIAGNOSIS — R0609 Other forms of dyspnea: Secondary | ICD-10-CM

## 2013-10-29 DIAGNOSIS — R0989 Other specified symptoms and signs involving the circulatory and respiratory systems: Secondary | ICD-10-CM

## 2013-10-29 DIAGNOSIS — E785 Hyperlipidemia, unspecified: Secondary | ICD-10-CM

## 2013-10-29 DIAGNOSIS — I2510 Atherosclerotic heart disease of native coronary artery without angina pectoris: Secondary | ICD-10-CM

## 2013-10-29 DIAGNOSIS — I679 Cerebrovascular disease, unspecified: Secondary | ICD-10-CM

## 2013-10-29 MED ORDER — PRAVASTATIN SODIUM 40 MG PO TABS: 40.0000 mg | ORAL_TABLET | Freq: Every evening | ORAL | Status: DC

## 2013-10-29 MED ORDER — FUROSEMIDE 40 MG PO TABS: 40.0000 mg | ORAL_TABLET | Freq: Two times a day (BID) | ORAL | Status: DC

## 2013-10-29 NOTE — Assessment & Plan Note (Signed)
Plan continue present medications.

## 2013-10-29 NOTE — Assessment & Plan Note (Signed)
Zocor recently discontinued. Begin Pravachol 40 mg daily. Check lipids and liver in 6 weeks. She did not tolerate Lipitor in the past.

## 2013-10-29 NOTE — Patient Instructions (Signed)
Your physician recommends that you schedule a follow-up appointment in: 3-4 WEEKS WITH EXTENDER WHEN DR [place] physician has requested that you have an echocardiogram. Echocardiography is a painless test that uses sound waves to create images of your heart. It provides your doctor with information about the size and shape of your heart and how well your heart's chambers and valves are working. This procedure takes approximately one hour. There are no restrictions for this procedure.   Your physician has requested that you have a carotid duplex. This test is an ultrasound of the carotid arteries in your neck. It looks at blood flow through these arteries that supply the brain with blood. Allow one hour for this exam. There are no restrictions or special instructions.   INCREASE FUROSEMIDE TO 40 MG TWICE DAILY  Your physician recommends that you return for lab work in: [place]  START PRAVASTATIN 40 MG [place] recommends that you return for lab work in: [place] TO LAB WORK

## 2013-10-29 NOTE — Assessment & Plan Note (Addendum)
Continue aspirin. Resume statin. She did have brief chest pain after finding her son dead. She has had no other exertional symptoms. Continue medical therapy for now.

## 2013-10-29 NOTE — Assessment & Plan Note (Signed)
Patient is volume overloaded on examination. This increased in severity following recent knee surgery and pancreatitis. She was most likely treated with increased IV fluids. Increase Lasix to 40 mg twice a day. Check potassium, renal function and BNP in one week. Schedule echocardiogram.

## 2013-10-29 NOTE — Assessment & Plan Note (Signed)
Continue aspirin. Resume statin. Schedule followup carotid Dopplers.

## 2013-10-29 NOTE — Progress Notes (Signed)
HPI: FU coronary artery disease. Echocardiogram in September 2010 showed normal LV function. Cardiac catheterization in September of 2011 revealed Left main normal. Left circumflex has irregularities. Left anterior descending with a 50% focal stenosis in the midportion of the vessel and scattered irregularities otherwise; 50% stenosis in the proximal right coronary artery, but otherwise no significant focal stenosis is present. Inferior apical akinesis with overall well-preserved left ventricular function in a somewhat hyperdynamic manner. Nuclear study in September 2013 showed an ejection fraction of 71% and normal perfusion. Carotid Dopplers in September 2013 showed 0-39% bilateral stenosis and followup recommended in one year. She was last seen in April 2015. Had pancreatitis 5/15. Since then, She has noticed increasing dyspnea on exertion, orthopnea and pedal edema. Her son was found dead recently in 2022/09/12. She had brief chest pain when she found him to resolve with nitroglycerin but no other chest pain. No syncope.   Current Outpatient Prescriptions  Medication Sig Dispense Refill  . aspirin 81 MG tablet Take 81 mg by mouth daily.        . budesonide-formoterol (SYMBICORT) 160-4.5 MCG/ACT inhaler Inhale 2 puffs into the lungs 2 (two) times daily.      . furosemide (LASIX) 40 MG tablet Take 40 mg by mouth daily.        Marland Kitchen losartan (COZAAR) 100 MG tablet Take 1 tablet (100 mg total) by mouth daily.  30 tablet  12  . metoprolol tartrate (LOPRESSOR) 25 MG tablet TAKE 1 TABLET BY MOUTH 2 TIMES DAILY  60 tablet  2  . NITROSTAT 0.4 MG SL tablet DISSOLVE 1 TABLET UNDER THE TONGUE EVERY 5 MINUTES AS NEEDED UP TO 3 DOSES  25 tablet  0  . potassium chloride SA (K-DUR,KLOR-CON) 20 MEQ tablet Take 20 mEq by mouth daily.       Marland Kitchen UNKNOWN TO PATIENT        No current facility-administered medications for this visit.     Past Medical History  Diagnosis Date  . NSTEMI (non-ST elevated myocardial  infarction)     with only mild to moderate CAD in September of 2011,There was concern for apival ballooning. She had hyperdynamic LV function.  Marland Kitchen HTN (hypertension)   . COPD (chronic obstructive pulmonary disease)   . CAD (coronary artery disease)   . Lung cancer     [name]  Marland Kitchen Weight gain 12/17/2011  . Asthma     Past Surgical History  Procedure Laterality Date  . Knee surgery    . Vesicovaginal fistula closure w/ tah    . Gallbladder surgery    . Cataract surgery    . Foot surgery    . Lung lobectomy      History   Social History  . Marital Status: Divorced    Spouse Name: N/A    Number of Children: N/A  . Years of Education: N/A   Occupational History  . Not on file.   Social History Main Topics  . Smoking status: Former Research scientist (life sciences)  . Smokeless tobacco: Never Used     Comment: She stopped smoking four years ago.  . Alcohol Use: No  . Drug Use: No  . Sexual Activity: Not on file   Other Topics Concern  . Not on file   Social History Narrative  . No narrative on file    ROS: no fevers or chills, productive cough, hemoptysis, dysphasia,  odynophagia, melena, hematochezia, dysuria, hematuria, rash, seizure activity, orthopnea, PND, pedal edema, claudication. Remaining systems are negative.  Physical Exam: Well-developed obese in no acute distress.  Skin is warm and dry.  HEENT is normal.  Neck is supple.  Chest is clear to auscultation with normal expansion.  Cardiovascular exam is regular rate and rhythm.  Abdominal exam nontender or distended. No masses palpated. Extremities show 1-2+ edema. neuro grossly intact   EKG shows sinus rhythm at a rate of 83. Occasional PVC. Left axis deviation. No ST changes.

## 2013-11-05 ENCOUNTER — Ambulatory Visit (HOSPITAL_BASED_OUTPATIENT_CLINIC_OR_DEPARTMENT_OTHER)
Admission: RE | Admit: 2013-11-05 | Discharge: 2013-11-05 | Disposition: A | Discharge status: Home / Self Care | Payer: Medicare Other | Source: Ambulatory Visit | Attending: Cardiology | Admitting: Cardiology

## 2013-11-05 ENCOUNTER — Ambulatory Visit (HOSPITAL_COMMUNITY)
Admission: RE | Admit: 2013-11-05 | Discharge: 2013-11-05 | Disposition: A | Discharge status: Home / Self Care | Payer: Medicare Other | Source: Ambulatory Visit | Attending: Cardiology | Admitting: Cardiology

## 2013-11-05 DIAGNOSIS — R0609 Other forms of dyspnea: Secondary | ICD-10-CM | POA: Insufficient documentation

## 2013-11-05 DIAGNOSIS — I679 Cerebrovascular disease, unspecified: Secondary | ICD-10-CM

## 2013-11-05 DIAGNOSIS — I2510 Atherosclerotic heart disease of native coronary artery without angina pectoris: Secondary | ICD-10-CM

## 2013-11-05 DIAGNOSIS — I509 Heart failure, unspecified: Secondary | ICD-10-CM | POA: Insufficient documentation

## 2013-11-05 DIAGNOSIS — I517 Cardiomegaly: Secondary | ICD-10-CM

## 2013-11-05 DIAGNOSIS — R0989 Other specified symptoms and signs involving the circulatory and respiratory systems: Secondary | ICD-10-CM

## 2013-11-05 DIAGNOSIS — I5031 Acute diastolic (congestive) heart failure: Secondary | ICD-10-CM | POA: Insufficient documentation

## 2013-11-05 NOTE — Progress Notes (Signed)
2D Echocardiogram Complete.  11/05/2013   Jimeka Balan [place], RDCS

## 2013-11-05 NOTE — Progress Notes (Signed)
Carotid Duplex Completed. Maddux Vans[place]oc, BS, RDMS, RVT  

## 2013-11-20 ENCOUNTER — Ambulatory Visit: Payer: Medicare Other | Admitting: Cardiology

## 2013-12-14 ENCOUNTER — Telehealth: Payer: Self-pay | Admitting: Cardiology

## 2013-12-14 DIAGNOSIS — I2510 Atherosclerotic heart disease of native coronary artery without angina pectoris: Secondary | ICD-10-CM

## 2013-12-14 NOTE — Telephone Encounter (Signed)
Spoke with pt, aware okay to remain off pravastatin. She was confused before and states she would like to try lipitor again. Will forward for dr Stanford Breed review for dose of lipitor She also needs refill on NTG

## 2013-12-14 NOTE — Telephone Encounter (Signed)
New Message   Pt called states that she cannot take the pravastatin and she has stopped taking the medication. She reports it makes her knees hurt// please assist

## 2013-12-14 NOTE — Telephone Encounter (Signed)
DC pravachol; lipitor 20 mg daily; lipids and liver in six weeks Kirk Ruths

## 2013-12-18 MED ORDER — ATORVASTATIN CALCIUM 20 MG PO TABS: 20.0000 mg | ORAL_TABLET | Freq: Every day | ORAL | Status: DC

## 2013-12-18 MED ORDER — NITROGLYCERIN 0.4 MG SL SUBL: 0.4000 mg | SUBLINGUAL_TABLET | SUBLINGUAL | Status: DC | PRN

## 2013-12-18 NOTE — Telephone Encounter (Signed)
Spoke with pt, Aware of dr crenshaw's recommendations.  °

## 2014-01-15 ENCOUNTER — Telehealth: Payer: Self-pay | Admitting: Cardiology

## 2014-01-15 NOTE — Telephone Encounter (Signed)
Fax request received. Surgery 9/28 at [place] [facility] w/vitrectomy   Deferred to Dr. Stanford Breed and Hilda Blades - placed on Debra's desk

## 2014-01-15 NOTE — Telephone Encounter (Signed)
A clarence was faxed over 01-10-14,still have not received it back. Pt is scheduled for surgery on 01-21-14. She need this back asap. Please fax to [phone] Att: Anderson Malta.

## 2014-01-15 NOTE — Telephone Encounter (Signed)
Patient should have had a bmet and bnp as lasix was increased at last ov; need those labs; if ok she may proceed with retinal surgery Kirk Ruths

## 2014-01-15 NOTE — Telephone Encounter (Signed)
Spoke with [employer]. Informed her that I cannot locate clearance form and Dr. Stanford Breed and Hilda Blades are not in office today to ask them about this. She states it was an office visit note with hand-written notes on it requesting clearance?   She will re-fax to Attn: [place] so that it can be given to Dr. Lyda Kalata

## 2014-01-16 ENCOUNTER — Telehealth: Payer: Self-pay | Admitting: Cardiology

## 2014-01-16 DIAGNOSIS — I5031 Acute diastolic (congestive) heart failure: Secondary | ICD-10-CM

## 2014-01-16 NOTE — Telephone Encounter (Signed)
Lab orders placed.  

## 2014-01-16 NOTE — Telephone Encounter (Signed)
Spoke with pt, Aware of dr Jacalyn Lefevre recommendations.  She will go to the [facility] lab today

## 2014-01-17 ENCOUNTER — Telehealth: Payer: Self-pay | Admitting: *Deleted

## 2014-01-17 ENCOUNTER — Encounter: Payer: Self-pay | Admitting: Cardiology

## 2014-01-17 LAB — BASIC METABOLIC PANEL WITH GFR
BUN: 19 mg/dL (ref 6–23)
CALCIUM: 9.6 mg/dL (ref 8.4–10.5)
CO2: 31 meq/L (ref 19–32)
CREATININE: 0.68 mg/dL (ref 0.50–1.10)
Chloride: 101 mEq/L (ref 96–112)
GFR, Est African American: >89 mL/min
GFR, Est Non African American: 85 mL/min
GLUCOSE: 89 mg/dL (ref 70–99)
Potassium: 4.7 mEq/L (ref 3.5–5.3)
Sodium: 142 mEq/L (ref 135–145)

## 2014-01-17 LAB — BRAIN NATRIURETIC PEPTIDE: Brain Natriuretic Peptide: 30.6 pg/mL (ref 0.0–100.0)

## 2014-01-17 NOTE — Telephone Encounter (Signed)
Anderson Malta was calling to see if Dr. Stanford Breed cleared the pt for surgery. She stated she faxed over some paperwork on 9/17 for the Dr and wanted to know if he received it. Pt has surgery scheduled for 9/28 and would like feedback before continuing with the surgery. Please call  Thanks

## 2014-01-17 NOTE — Telephone Encounter (Signed)
Clearance form for eye surgery faxed to the number provided

## 2014-01-17 NOTE — Telephone Encounter (Signed)
This encounter was created in error - please disregard.

## 2014-03-06 ENCOUNTER — Encounter: Payer: Self-pay | Admitting: Emergency Medicine

## 2014-03-06 ENCOUNTER — Emergency Department
Admission: EM | Admit: 2014-03-06 | Discharge: 2014-03-06 | Disposition: A | Discharge status: Home / Self Care | Payer: Medicare Other | Source: Home / Self Care | Attending: Emergency Medicine | Admitting: Emergency Medicine

## 2014-03-06 DIAGNOSIS — J208 Acute bronchitis due to other specified organisms: Secondary | ICD-10-CM

## 2014-03-06 MED ORDER — METHYLPREDNISOLONE SODIUM SUCC 125 MG IJ SOLR: 80.0000 mg | Freq: Once | INTRAMUSCULAR | Status: DC

## 2014-03-06 MED ORDER — AMOXICILLIN 500 MG PO CAPS: 500.0000 mg | ORAL_CAPSULE | Freq: Three times a day (TID) | ORAL | Status: DC

## 2014-03-06 MED ORDER — METHYLPREDNISOLONE SODIUM SUCC 125 MG IJ SOLR
125.0000 mg | Freq: Once | INTRAMUSCULAR | Status: AC
  Administered 2014-03-06: 125 mg via INTRAMUSCULAR

## 2014-03-06 MED ORDER — PREDNISONE 10 MG PO TABS: ORAL_TABLET | ORAL | Status: DC

## 2014-03-06 NOTE — Discharge Instructions (Signed)

## 2014-03-06 NOTE — ED Provider Notes (Signed)
CSN: [id number]     Arrival date & time 03/06/14  1436 History   First MD Initiated Contact with Patient 03/06/14 1518     Chief Complaint  Patient presents with  . URI   (Consider location/radiation/quality/duration/timing/severity/associated sxs/prior Treatment) Patient is a 78 y.o. female presenting with URI. The history is provided by the patient. No language interpreter was used.  URI Presenting symptoms: congestion and cough   Severity:  Moderate Onset quality:  Gradual Duration:  1 week Timing:  Constant Progression:  Worsening Chronicity:  New Relieved by:  Nothing Worsened by:  Nothing tried Ineffective treatments:  None tried Risk factors: being elderly   patient reports she has had a cough and a cold for the past week. She has a history of COPD and lung cancer. Patient states normally when she has a cold Dr. Redmond Pulling or Dr. Assunta Found  give her a shot of medicine to help before she gets an exacerbation of her COPD or pneumonia. Patient reports she normally takes amoxicillin for her infections with her COPD. Patient denies any fever she denies any chills she reports she is chronically short of breath and is on oxygen at home. She did not bring her oxygen tank with her because it is cumbersome  Past Medical History  Diagnosis Date  . NSTEMI (non-ST elevated myocardial infarction)     with only mild to moderate CAD in September of 2011,There was concern for apival ballooning. She had hyperdynamic LV function.  Marland Kitchen HTN (hypertension)   . COPD (chronic obstructive pulmonary disease)   . CAD (coronary artery disease)   . Lung cancer     [name]  Marland Kitchen Weight gain 12/17/2011  . Asthma    Past Surgical History  Procedure Laterality Date  . Knee surgery    . Vesicovaginal fistula closure w/ tah    . Gallbladder surgery    . Cataract surgery    . Foot surgery    . Lung lobectomy     Family History   Problem Relation Age of Onset  . Heart disease      POSITIVE FAMILY HX. OF   . Pancreatitis      QUESTIONABLE FROM ACE INHIBITORS  . Heart disease Mother 13  . Asthma Mother   . Diabetes Mother   . Cancer Mother    History  Substance Use Topics  . Smoking status: Former Research scientist (life sciences)  . Smokeless tobacco: Never Used     Comment: She stopped smoking four years ago.  . Alcohol Use: No   OB History    No data available     Review of Systems  HENT: Positive for congestion.   Respiratory: Positive for cough.   All other systems reviewed and are negative.   Allergies  Sulfa antibiotics and Sulfonamide derivatives  Home Medications   Prior to Admission medications   Medication Sig Start Date End Date Taking? Authorizing Provider  amoxicillin (AMOXIL) 500 MG capsule Take 1 capsule (500 mg total) by mouth 3 (three) times daily. 03/06/14   Fransico Meadow, PA-C  aspirin 81 MG tablet Take 81 mg by mouth daily.      Historical Provider, MD  atorvastatin (LIPITOR) 20 MG tablet Take 1 tablet (20 mg total) by mouth daily. 12/18/13   Lelon Perla, MD  budesonide-formoterol (SYMBICORT) 160-4.5 MCG/ACT inhaler Inhale 2 puffs into the lungs 2 (two) times daily.    Historical  Provider, MD  furosemide (LASIX) 40 MG tablet Take 1 tablet (40 mg total) by mouth 2 (two) times daily. 10/29/13   Lelon Perla, MD  losartan (COZAAR) 100 MG tablet Take 1 tablet (100 mg total) by mouth daily. 11/30/11   Lelon Perla, MD  metoprolol tartrate (LOPRESSOR) 25 MG tablet TAKE 1 TABLET BY MOUTH 2 TIMES DAILY 08/10/12   Lelon Perla, MD  nitroGLYCERIN (NITROSTAT) 0.4 MG SL tablet Place 1 tablet (0.4 mg total) under the tongue every 5 (five) minutes as needed for chest pain. 12/18/13   Lelon Perla, MD  potassium chloride SA (K-DUR,KLOR-CON) 20 MEQ tablet Take 20 mEq by mouth daily.     Historical Provider, MD  predniSONE (DELTASONE) 10 MG tablet 5,4,3,2,1 taper,   Begin on 11/12 03/06/14   Fransico Meadow,  PA-C  UNKNOWN TO PATIENT     Historical Provider, MD   BP 129/79 mmHg  Pulse 81  Temp(Src) 97.8 F (36.6 C) (Oral)  Resp 20  Ht 5\' 1"  (1.549 m)  Wt 199 lb (90.266 kg)  BMI 37.62 kg/m2  SpO2 93% Physical Exam  Constitutional: She is oriented to person, place, and time. She appears well-developed and well-nourished.  HENT:  Head: Normocephalic and atraumatic.  Right Ear: External ear normal.  Left Ear: External ear normal.  Mouth/Throat: Oropharynx is clear and moist.  Eyes: Conjunctivae and EOM are normal. Pupils are equal, round, and reactive to light.  Neck: Normal range of motion.  Cardiovascular: Normal rate and regular rhythm.   Pulmonary/Chest: Effort normal.  Harsh breath sounds and rhonchi all lobes, no wheezing  Abdominal: Soft. She exhibits no distension.  Musculoskeletal: Normal range of motion.  Neurological: She is alert and oriented to person, place, and time.  Skin: Skin is warm.  Psychiatric: She has a normal mood and affect.  Nursing note and vitals reviewed.   ED Course  Procedures (including critical care time) Labs Review Labs Reviewed - No data to display  Imaging Review No results found.   MDM I reviewed patient's records she has received injections of Solu-Medrol here in the past for her symptoms. She has also received prescriptions for amoxicillin. Patient declined to have a chest x-ray done at this time. She does not feel like she has pneumonia or have concern of recurring lung cancer. I advised patient that I would treat her as treated previously with Solu-Medrol injection and amoxicillin with the caveat that if shortness of breath increases she has fever chills or any sign of worsening illness she needs to return for recheck or see her primary care physician.   1. Acute bronchitis due to other specified organisms     AVS Amoxicillin Solu-Medrol IM   Fransico Meadow, PA-C 03/06/14 1704

## 2014-03-06 NOTE — ED Notes (Signed)
Reports having a "cold" for about one week; has COPD and has had lung cancer surgery, so is seeking care before worsening of condition.Not on her oxygen today in [facility].

## 2014-09-21 ENCOUNTER — Encounter: Payer: Self-pay | Admitting: Emergency Medicine

## 2014-09-21 ENCOUNTER — Emergency Department
Admission: EM | Admit: 2014-09-21 | Discharge: 2014-09-21 | Disposition: A | Discharge status: Home / Self Care | Payer: Medicare Other | Source: Home / Self Care | Attending: Emergency Medicine | Admitting: Emergency Medicine

## 2014-09-21 DIAGNOSIS — K1120 Sialoadenitis, unspecified: Secondary | ICD-10-CM

## 2014-09-21 DIAGNOSIS — J441 Chronic obstructive pulmonary disease with (acute) exacerbation: Secondary | ICD-10-CM

## 2014-09-21 MED ORDER — CEFDINIR 300 MG PO CAPS: 300.0000 mg | ORAL_CAPSULE | Freq: Two times a day (BID) | ORAL | Status: DC

## 2014-09-21 MED ORDER — CEFTRIAXONE SODIUM 1 G IJ SOLR
1.0000 g | INTRAMUSCULAR | Status: AC
  Administered 2014-09-21: 1 g via INTRAMUSCULAR

## 2014-09-21 MED ORDER — METHYLPREDNISOLONE ACETATE 80 MG/ML IJ SUSP
80.0000 mg | Freq: Once | INTRAMUSCULAR | Status: AC
  Administered 2014-09-21: 80 mg via INTRAMUSCULAR

## 2014-09-21 MED ORDER — PREDNISONE 20 MG PO TABS: 20.0000 mg | ORAL_TABLET | Freq: Two times a day (BID) | ORAL | Status: DC

## 2014-09-21 NOTE — ED Notes (Signed)
Patient C/O pain in the left ear times two days.

## 2014-09-21 NOTE — ED Provider Notes (Addendum)
CSN: [id number]     Arrival date & time 09/21/14  1245 History   First MD Initiated Contact with Patient 09/21/14 1345     Chief Complaint  Patient presents with  . Otalgia   Daughter brings her in HPI 2 days of left facial and ear discomfort, swelling over left side of face, low-grade fever, sinus congestion, mild discolored rhinorrhea, mild increase in her chronic cough and chronic dyspnea on exertion associated with COPD. Denies chest pain or jaw pain or headache or nausea or vomiting or abdominal pain or syncope or focal neurologic symptoms.  A few months ago, had similar symptoms with swelling of left facial/parotid area, that resolved with an antibiotic.  Remainder of Review of Systems negative for acute change except as noted in the HPI.  Past Medical History  Diagnosis Date  . NSTEMI (non-ST elevated myocardial infarction)     with only mild to moderate CAD in September of 2011,There was concern for apival ballooning. She had hyperdynamic LV function.  Marland Kitchen HTN (hypertension)   . COPD (chronic obstructive pulmonary disease)   . CAD (coronary artery disease)   . Lung cancer     [name]  Marland Kitchen Weight gain 12/17/2011  . Asthma    Past Surgical History  Procedure Laterality Date  . Knee surgery    . Vesicovaginal fistula closure w/ tah    . Gallbladder surgery    . Cataract surgery    . Foot surgery    . Lung lobectomy     Family History  Problem Relation Age of Onset  . Heart disease      POSITIVE FAMILY HX. OF   . Pancreatitis      QUESTIONABLE FROM ACE INHIBITORS  . Heart disease Mother 37  . Asthma Mother   . Diabetes Mother   . Cancer Mother    History  Substance Use Topics  . Smoking status: Former Research scientist (life sciences)  . Smokeless tobacco: Never Used     Comment: She stopped smoking four years ago.  . Alcohol Use: No   OB History    No data available     Review of Systems  Allergies    Sulfa antibiotics and Sulfonamide derivatives  Home Medications   Prior to Admission medications   Medication Sig Start Date End Date Taking? Authorizing Provider  amoxicillin (AMOXIL) 500 MG capsule Take 1 capsule (500 mg total) by mouth 3 (three) times daily. 03/06/14   Fransico Meadow, PA-C  aspirin 81 MG tablet Take 81 mg by mouth daily.      Historical Provider, MD  atorvastatin (LIPITOR) 20 MG tablet Take 1 tablet (20 mg total) by mouth daily. 12/18/13   Lelon Perla, MD  budesonide-formoterol (SYMBICORT) 160-4.5 MCG/ACT inhaler Inhale 2 puffs into the lungs 2 (two) times daily.    Historical Provider, MD  cefdinir (OMNICEF) 300 MG capsule Take 1 capsule (300 mg total) by mouth 2 (two) times daily. X 10 days 09/21/14   Jacqulyn Cane, MD  furosemide (LASIX) 40 MG tablet Take 1 tablet (40 mg total) by mouth 2 (two) times daily. 10/29/13   Lelon Perla, MD  losartan (COZAAR) 100 MG tablet Take 1 tablet (100 mg total) by mouth daily. 11/30/11   Lelon Perla, MD  metoprolol tartrate (LOPRESSOR) 25 MG tablet TAKE 1 TABLET BY MOUTH 2 TIMES DAILY 08/10/12   Lelon Perla, MD  nitroGLYCERIN (  NITROSTAT) 0.4 MG SL tablet Place 1 tablet (0.4 mg total) under the tongue every 5 (five) minutes as needed for chest pain. 12/18/13   Lelon Perla, MD  potassium chloride SA (K-DUR,KLOR-CON) 20 MEQ tablet Take 20 mEq by mouth daily.     Historical Provider, MD  predniSONE (DELTASONE) 20 MG tablet Take 1 tablet (20 mg total) by mouth 2 (two) times daily with a meal. X 5 days 09/21/14   Jacqulyn Cane, MD  UNKNOWN TO PATIENT     Historical Provider, MD   BP 129/83 mmHg  Pulse 83  Temp(Src) 98.4 F (36.9 C) (Oral)  Resp 16  Ht '5\' 1"'$  (1.549 m)  Wt 205 lb 4 oz (93.101 kg)  BMI 38.80 kg/m2  SpO2 92% Physical Exam  Constitutional: She is oriented to person, place, and time. She appears well-developed and well-nourished. No distress.  Alert cooperative no distress. Pulse ox repeated 93% room air,  which is consistent with her baseline  HENT:  Head: Atraumatic.    Right Ear: Tympanic membrane normal.  Left Ear: Tympanic membrane normal.  Nose: Nose normal.  Mouth/Throat: Oropharynx is clear and moist. No oropharyngeal exudate.  Eyes: Right eye exhibits no discharge. Left eye exhibits no discharge. No scleral icterus.  Neck: Neck supple.  Cardiovascular: Normal rate, regular rhythm and normal heart sounds.   Pulmonary/Chest: No respiratory distress. She has wheezes (mild late expiratory.). She has rhonchi. She has no rales.  Lymphadenopathy:    She has no cervical adenopathy.  Neurological: She is alert and oriented to person, place, and time.  Skin: Skin is warm and dry. No rash noted. She is not diaphoretic.  Nursing note and vitals reviewed.   ED Course  Procedures (including critical care time) Labs Review Labs Reviewed - No data to display  Imaging Review No results found. She declined chest x-ray today  MDM   1. Parotiditis   2. COPD exacerbation    Treatment options discussed, as well as risks, benefits, alternatives. Patient and daughter voiced understanding and agreement with the following plans: Rocephin 1 g IM stat Depo-Medrol 80 mg IM stat Discharge Medication List as of 09/21/2014  2:16 PM    START taking these medications   Details  cefdinir (OMNICEF) 300 MG capsule Take 1 capsule (300 mg total) by mouth 2 (two) times daily. X 10 days, Starting 09/21/2014, Until Discontinued, Normal       predniSONE (DELTASONE) 20 MG tablet Take 1 tablet (20 mg total) by mouth 2 (two) times daily with a meal. X 5 days 10 tablet  Other symptomatic care discussed. Names and contact number of ENT is given, and daughter promises that she will follow-up with an ENT within 3 days. Follow-up with your primary care doctor in 5-7 days if not improving, or sooner if symptoms become worse. Precautions discussed. Red flags discussed.--Emergency room if any red flag Questions  invited and answered. They voiced understanding and agreement.       Jacqulyn Cane, MD 09/21/14 1910  Jacqulyn Cane, MD 09/21/14 [phone]

## 2014-11-01 ENCOUNTER — Other Ambulatory Visit: Payer: Self-pay | Admitting: Otolaryngology

## 2014-11-01 DIAGNOSIS — K1123 Chronic sialoadenitis: Secondary | ICD-10-CM

## 2014-11-01 DIAGNOSIS — K1120 Sialoadenitis, unspecified: Secondary | ICD-10-CM

## 2014-11-05 ENCOUNTER — Ambulatory Visit
Admission: RE | Admit: 2014-11-05 | Discharge: 2014-11-05 | Disposition: A | Discharge status: Home / Self Care | Payer: Medicare Other | Source: Ambulatory Visit | Attending: Otolaryngology | Admitting: Otolaryngology

## 2014-11-05 DIAGNOSIS — K1120 Sialoadenitis, unspecified: Secondary | ICD-10-CM

## 2014-11-05 DIAGNOSIS — K1123 Chronic sialoadenitis: Secondary | ICD-10-CM

## 2014-11-14 ENCOUNTER — Other Ambulatory Visit (HOSPITAL_COMMUNITY): Payer: Self-pay | Admitting: Otolaryngology

## 2014-11-14 DIAGNOSIS — K118 Other diseases of salivary glands: Secondary | ICD-10-CM

## 2014-11-21 ENCOUNTER — Other Ambulatory Visit: Payer: Self-pay | Admitting: Radiology

## 2014-11-22 ENCOUNTER — Ambulatory Visit (HOSPITAL_COMMUNITY)
Admission: RE | Admit: 2014-11-22 | Discharge: 2014-11-22 | Disposition: A | Discharge status: Home / Self Care | Payer: Medicare Other | Source: Ambulatory Visit | Attending: Otolaryngology | Admitting: Otolaryngology

## 2014-11-22 ENCOUNTER — Encounter (HOSPITAL_COMMUNITY): Payer: Self-pay

## 2014-11-22 DIAGNOSIS — J45909 Unspecified asthma, uncomplicated: Secondary | ICD-10-CM | POA: Insufficient documentation

## 2014-11-22 DIAGNOSIS — Z882 Allergy status to sulfonamides status: Secondary | ICD-10-CM | POA: Insufficient documentation

## 2014-11-22 DIAGNOSIS — Z7982 Long term (current) use of aspirin: Secondary | ICD-10-CM | POA: Diagnosis not present

## 2014-11-22 DIAGNOSIS — I252 Old myocardial infarction: Secondary | ICD-10-CM | POA: Diagnosis not present

## 2014-11-22 DIAGNOSIS — H9202 Otalgia, left ear: Secondary | ICD-10-CM | POA: Insufficient documentation

## 2014-11-22 DIAGNOSIS — Z902 Acquired absence of lung [part of]: Secondary | ICD-10-CM | POA: Insufficient documentation

## 2014-11-22 DIAGNOSIS — I2510 Atherosclerotic heart disease of native coronary artery without angina pectoris: Secondary | ICD-10-CM | POA: Insufficient documentation

## 2014-11-22 DIAGNOSIS — K118 Other diseases of salivary glands: Secondary | ICD-10-CM

## 2014-11-22 DIAGNOSIS — J449 Chronic obstructive pulmonary disease, unspecified: Secondary | ICD-10-CM | POA: Diagnosis not present

## 2014-11-22 DIAGNOSIS — Z79899 Other long term (current) drug therapy: Secondary | ICD-10-CM | POA: Diagnosis not present

## 2014-11-22 DIAGNOSIS — I10 Essential (primary) hypertension: Secondary | ICD-10-CM | POA: Insufficient documentation

## 2014-11-22 DIAGNOSIS — Z87891 Personal history of nicotine dependence: Secondary | ICD-10-CM | POA: Insufficient documentation

## 2014-11-22 DIAGNOSIS — K119 Disease of salivary gland, unspecified: Secondary | ICD-10-CM | POA: Insufficient documentation

## 2014-11-22 DIAGNOSIS — Z85118 Personal history of other malignant neoplasm of bronchus and lung: Secondary | ICD-10-CM | POA: Diagnosis not present

## 2014-11-22 LAB — CBC
HCT: 45.7 % (ref 36.0–46.0)
Hemoglobin: 14.6 g/dL (ref 12.0–15.0)
MCH: 28.8 pg (ref 26.0–34.0)
MCHC: 31.9 g/dL (ref 30.0–36.0)
MCV: 90.1 fL (ref 78.0–100.0)
PLATELETS: 281 10*3/uL (ref 150–400)
RBC: 5.07 MIL/uL (ref 3.87–5.11)
RDW: 14.1 % (ref 11.5–15.5)
WBC: 11.1 10*3/uL — AB (ref 4.0–10.5)

## 2014-11-22 LAB — PROTIME-INR
INR: 1 (ref 0.00–1.49)
PROTHROMBIN TIME: 13.4 s (ref 11.6–15.2)

## 2014-11-22 LAB — APTT: aPTT: 29 seconds (ref 24–37)

## 2014-11-22 MED ORDER — SODIUM CHLORIDE 0.9 % IV SOLN
INTRAVENOUS | Status: DC
  Administered 2014-11-22: 10:00:00 via INTRAVENOUS

## 2014-11-22 MED ORDER — MIDAZOLAM HCL 2 MG/2ML IJ SOLN
INTRAMUSCULAR | Status: AC
  Filled 2014-11-22: qty 2

## 2014-11-22 MED ORDER — LIDOCAINE HCL (PF) 1 % IJ SOLN
INTRAMUSCULAR | Status: AC
  Filled 2014-11-22: qty 10

## 2014-11-22 MED ORDER — FENTANYL CITRATE (PF) 100 MCG/2ML IJ SOLN
INTRAMUSCULAR | Status: AC | PRN
  Administered 2014-11-22: 50 ug via INTRAVENOUS

## 2014-11-22 MED ORDER — FENTANYL CITRATE (PF) 100 MCG/2ML IJ SOLN
INTRAMUSCULAR | Status: AC
  Filled 2014-11-22: qty 2

## 2014-11-22 MED ORDER — MIDAZOLAM HCL 2 MG/2ML IJ SOLN
INTRAMUSCULAR | Status: AC | PRN
  Administered 2014-11-22: 1 mg via INTRAVENOUS

## 2014-11-22 NOTE — Procedures (Signed)
Successful [place] CORE BX OF LEFT HYPOECHOIC PAROTID AREA/LESION NO COMP STABLE PATH PENDING FULL REPORT IN PACS

## 2014-11-22 NOTE — Discharge Instructions (Signed)
Needle Biopsy °Care After °These instructions give you information on caring for yourself after your procedure. Your doctor may also give you more specific instructions. Call your doctor if you have any problems or questions after your procedure. °HOME CARE °· Rest for 4 hours after your biopsy, except for getting up to go to the bathroom or as told. °· Keep the places where the needles were put in clean and dry. °¨ Do not put powder or lotion on the sites. °¨ Do not shower until 24 hours after the test. Remove all bandages (dressings) before showering. °¨ Remove all bandages at least once every day. Gently clean the sites with soap and water. Keep putting a new bandage on until the skin is closed. °Finding out the results of your test °Ask your doctor when your test results will be ready. Make sure you follow up and get the test results. °GET HELP RIGHT AWAY IF:  °· You have shortness of breath or trouble breathing. °· You have pain or cramping in your belly (abdomen). °· You feel sick to your stomach (nauseous) or throw up (vomit). °· Any of the places where the needles were put in: °¨ Are puffy (swollen) or red. °¨ Are sore or hot to the touch. °¨ Are draining yellowish-white fluid (pus). °¨ Are bleeding after 10 minutes of pressing down on the site. Have someone keep pressing on any place that is bleeding until you see a doctor. °· You have any unusual pain that will not stop. °· You have a fever. °If you go to the emergency room, tell the nurse that you had a biopsy. Take this paper with you to show the nurse. °MAKE SURE YOU:  °· Understand these instructions. °· Will watch your condition. °· Will get help right away if you are not doing well or get worse. °Document Released: 03/25/2008 Document Revised: 07/05/2011 Document Reviewed: 03/25/2008 °Exit[employer]® Patient Information ©2015 [employer]. This information is not intended to replace advice given to you by your health care provider. Make sure you discuss  any questions you have with your health care provider. ° °

## 2014-11-22 NOTE — Sedation Documentation (Signed)
Patient denies pain and is resting comfortably.  

## 2014-11-22 NOTE — H&P (Signed)
Chief Complaint: Patient was seen in consultation today for left parotid mass at the request of Gore,Mitchell  Referring Physician(s): Gore,Mitchell  History of Present Illness: Brandy Cameron is a 79 y.o. female with c/o left ear pain x 4 months. She denies any tinnitus or loss of hearing. She does admit to rhinorrhea with ear pain. She has been seen by Dr. Simeon Craft and had an [place] head/neck on 7/12 that revealed left parotid mass and has been scheduled today for image guided biopsy with sedation. She denies any chest pain, shortness of breath or palpitations. She denies any active signs of bleeding or excessive bruising. The patient denies any history of sleep apnea, but does use oxygen 2L at night when sleeping. She has previously tolerated sedation without complications.    Past Medical History  Diagnosis Date  . NSTEMI (non-ST elevated myocardial infarction)     with only mild to moderate CAD in September of 2011,There was concern for apival ballooning. She had hyperdynamic LV function.  Marland Kitchen HTN (hypertension)   . COPD (chronic obstructive pulmonary disease)   . CAD (coronary artery disease)   . Lung cancer     [name]  Marland Kitchen Weight gain 12/17/2011  . Asthma     Past Surgical History  Procedure Laterality Date  . Knee surgery    . Vesicovaginal fistula closure w/ tah    . Gallbladder surgery    . Cataract surgery    . Foot surgery    . Lung lobectomy      Allergies: Sulfa antibiotics and Sulfonamide derivatives  Medications: Prior to Admission medications   Medication Sig Start Date End Date Taking? Authorizing Provider  aspirin 81 MG tablet Take 81 mg by mouth daily.     Yes Historical Provider, MD  atorvastatin (LIPITOR) 20 MG tablet Take 1 tablet (20 mg total) by mouth daily. 12/18/13  Yes Lelon Perla, MD  budesonide-formoterol (SYMBICORT) 160-4.5 MCG/ACT inhaler Inhale 2 puffs into  the lungs 2 (two) times daily.   Yes Historical Provider, MD  calcium carbonate (OS-CAL) 600 MG TABS tablet Take 600 mg by mouth daily with breakfast.   Yes Historical Provider, MD  furosemide (LASIX) 40 MG tablet Take 1 tablet (40 mg total) by mouth 2 (two) times daily. 10/29/13  Yes Lelon Perla, MD  losartan (COZAAR) 100 MG tablet Take 1 tablet (100 mg total) by mouth daily. 11/30/11  Yes Lelon Perla, MD  metoprolol tartrate (LOPRESSOR) 25 MG tablet TAKE 1 TABLET BY MOUTH 2 TIMES DAILY 08/10/12  Yes Lelon Perla, MD  nitroGLYCERIN (NITROSTAT) 0.4 MG SL tablet Place 1 tablet (0.4 mg total) under the tongue every 5 (five) minutes as needed for chest pain. 12/18/13  Yes Lelon Perla, MD  OMEGA-3 FATTY ACIDS PO Take by mouth daily.   Yes Historical Provider, MD  potassium chloride SA (K-DUR,KLOR-CON) 20 MEQ tablet Take 20 mEq by mouth daily.    Yes Historical Provider, MD     Family History  Problem Relation Age of Onset  . Heart disease      POSITIVE FAMILY HX. OF   . Pancreatitis      QUESTIONABLE FROM ACE INHIBITORS  . Heart disease Mother 23  . Asthma Mother   . Diabetes Mother   . Cancer Mother     History   Social History  . Marital Status: Divorced  Spouse Name: N/A  . Number of Children: N/A  . Years of Education: N/A   Social History Main Topics  . Smoking status: Former Research scientist (life sciences)  . Smokeless tobacco: Never Used     Comment: She stopped smoking four years ago.  . Alcohol Use: No  . Drug Use: No  . Sexual Activity: Not on file   Other Topics Concern  . None   Social History Narrative   Review of Systems: A 12 point ROS discussed and pertinent positives are indicated in the HPI above.  All other systems are negative.  Review of Systems  Vital Signs: BP 150/73 mmHg  Pulse 72  Temp(Src) 97.9 F (36.6 C)  Resp 20  Ht '5\' 1"'$  (1.549 m)  Wt 205 lb (92.987 kg)  BMI 38.75 kg/m2  SpO2 94%  Physical Exam  Constitutional: She is oriented to person,  place, and time. No distress.  HENT:  Head: Normocephalic and atraumatic.  Cardiovascular: Normal rate and regular rhythm.   Pulmonary/Chest: Effort normal.  Absent LUL BS, diminished BS in all other fields  Abdominal: Soft. Bowel sounds are normal. She exhibits no distension. There is no tenderness.  Neurological: She is alert and oriented to person, place, and time.  Skin: She is not diaphoretic.   Mallampati Score:  MD Evaluation Airway: WNL Heart: WNL Abdomen: WNL Chest/ Lungs: WNL ASA  Classification: 2 Mallampati/Airway Score: Two  Imaging: US Soft Tissue Head/neck  11/05/2014   CLINICAL DATA:  Chronic parotitis and sialoadenitis with swollen region in the left parotid gland.  EXAM: ULTRASOUND OF HEAD/NECK SOFT TISSUES  TECHNIQUE: Ultrasound examination of the head and neck soft tissues was performed in the area of clinical concern.  COMPARISON:  None.  FINDINGS: In the region of clinical swelling, a relatively hypoechoic area is seen along the margin of the left parotid gland measuring roughly 2.7 x 0.8 x 2.1 cm. This contains some internal echogenic areas and has a solid or complex cystic appearance. This appearance is nonspecific. Correlation with CT of the face with contrast may be helpful to delineate exact location and whether there is any abnormal enhancement in this region.  IMPRESSION: Relatively hypoechoic soft tissue near the expected position of the left parotid gland. This is nonspecific in appearance. CT of the face with contrast may be helpful for further delineation.   Electronically Signed   By: Aletta Edouard M.D.   On: 11/05/2014 19:54    Labs:  CBC:  Recent Labs  11/22/14 0930  WBC 11.1*  HGB 14.6  HCT 45.7  PLT 281    COAGS:  Recent Labs  11/22/14 0930  INR 1.00  APTT 29    BMP:  Recent Labs  01/16/14 1412  NA 142  K 4.7  CL 101  CO2 31  GLUCOSE 89  BUN 19  CALCIUM 9.6  CREATININE 0.68  GFRNONAA 85  GFRAA >89    Assessment  and Plan: Ear pain x 4 months Left parotid mass on [place] head/neck Seen by Dr. Simeon Craft  Scheduled today for image guided left parotid mass biopsy with sedation The patient has been NPO, no blood thinners taken, labs and vitals have been reviewed. Risks and Benefits discussed with the patient including, but not limited to bleeding, infection, damage to adjacent structures or low yield requiring additional tests. All of the patient's questions were answered, patient is agreeable to proceed. Consent signed and in chart.   Thank you for this interesting consult.  I greatly enjoyed meeting  Brandy Cameron and look forward to participating in their care.  A copy of this report was sent to the requesting provider on this date.  SignedHedy Jacob 11/22/2014, 10:05 AM   I spent a total of  30 Minutes in face to face in clinical consultation, greater than 50% of which was counseling/coordinating care for left parotid mass.

## 2015-01-01 ENCOUNTER — Emergency Department
Admission: EM | Admit: 2015-01-01 | Discharge: 2015-01-01 | Disposition: A | Discharge status: Home / Self Care | Payer: Medicare Other | Source: Home / Self Care | Attending: Family Medicine | Admitting: Family Medicine

## 2015-01-01 ENCOUNTER — Emergency Department (INDEPENDENT_AMBULATORY_CARE_PROVIDER_SITE_OTHER): Payer: Medicare Other

## 2015-01-01 ENCOUNTER — Encounter: Payer: Self-pay | Admitting: *Deleted

## 2015-01-01 DIAGNOSIS — J209 Acute bronchitis, unspecified: Secondary | ICD-10-CM

## 2015-01-01 DIAGNOSIS — R0602 Shortness of breath: Secondary | ICD-10-CM | POA: Diagnosis not present

## 2015-01-01 DIAGNOSIS — R05 Cough: Secondary | ICD-10-CM | POA: Diagnosis not present

## 2015-01-01 MED ORDER — TRIAMCINOLONE ACETONIDE 40 MG/ML IJ SUSP
40.0000 mg | Freq: Once | INTRAMUSCULAR | Status: AC
  Administered 2015-01-01: 40 mg via INTRAMUSCULAR

## 2015-01-01 MED ORDER — DOXYCYCLINE HYCLATE 100 MG PO CAPS: 100.0000 mg | ORAL_CAPSULE | Freq: Two times a day (BID) | ORAL | Status: DC

## 2015-01-01 NOTE — Discharge Instructions (Signed)
Take plain guaifenesin ('1200mg'$  extended release tabs such as Mucinex) twice daily, with plenty of water, for cough and congestion.  Get adequate rest.   Stop all antihistamines for now, and other non-prescription cough/cold preparations. Continue Symbicort.  Continue albuterol by nebulizer as needed.  May take Delsym Cough Suppressant at bedtime for nighttime cough.  Follow-up with family doctor if not improving about 7 to 10 days.

## 2015-01-01 NOTE — ED Provider Notes (Signed)
CSN: [id number]     Arrival date & time 01/01/15  1233 History   First MD Initiated Contact with Patient 01/01/15 1235     Chief Complaint  Patient presents with  . Shortness of Breath      HPI Comments: Two weeks ago patient developed typical cold-like symptoms including mild sore throat, sinus congestion, headache, fatigue, and cough.  Her cough has persisted, and during the past week she has developed pleuritic pain in her right posterior chest.  She complains of increased shortness of breath and wheezing with activity.  No fevers, chills, and sweats.  She uses Symbicort BID, and has a nebulizer with albuterol but rarely uses it. She has a past history of left lung CA, s/p VATS   The history is provided by the patient and a relative.    Past Medical History  Diagnosis Date  . NSTEMI (non-ST elevated myocardial infarction)     with only mild to moderate CAD in September of 2011,There was concern for apival ballooning. She had hyperdynamic LV function.  Marland Kitchen HTN (hypertension)   . COPD (chronic obstructive pulmonary disease)   . CAD (coronary artery disease)   . Lung cancer     [name]  Marland Kitchen Weight gain 12/17/2011  . Asthma    Past Surgical History  Procedure Laterality Date  . Knee surgery    . Vesicovaginal fistula closure w/ tah    . Gallbladder surgery    . Cataract surgery    . Foot surgery    . Lung lobectomy     Family History  Problem Relation Age of Onset  . Heart disease      POSITIVE FAMILY HX. OF   . Pancreatitis      QUESTIONABLE FROM ACE INHIBITORS  . Heart disease Mother 27  . Asthma Mother   . Diabetes Mother   . Cancer Mother    Social History  Substance Use Topics  . Smoking status: Former Research scientist (life sciences)  . Smokeless tobacco: Never Used     Comment: She stopped smoking four years ago.  . Alcohol Use: No   OB History    No data available     Review of Systems No sore  throat + hoarse + cough + pleuritic pain + wheezing + nasal congestion + post-nasal drainage + sinus pain/pressure No itchy/red eyes ? earache No hemoptysis + SOB No fever/chills No nausea No vomiting No abdominal pain + diarrhea No urinary symptoms No skin rash + fatigue + myalgias No headache    Allergies  Sulfa antibiotics and Sulfonamide derivatives  Home Medications   Prior to Admission medications   Medication Sig Start Date End Date Taking? Authorizing Provider  aspirin 81 MG tablet Take 81 mg by mouth daily.      Historical Provider, MD  atorvastatin (LIPITOR) 20 MG tablet Take 1 tablet (20 mg total) by mouth daily. 12/18/13   Lelon Perla, MD  budesonide-formoterol (SYMBICORT) 160-4.5 MCG/ACT inhaler Inhale 2 puffs into the lungs 2 (two) times daily.    Historical Provider, MD  calcium carbonate (OS-CAL) 600 MG TABS tablet Take 600 mg by mouth daily with breakfast.    Historical Provider, MD  doxycycline (VIBRAMYCIN) 100 MG capsule Take 1 capsule (100 mg total) by mouth 2 (two) times daily. Take with food. 01/01/15   Kandra Nicolas, MD  furosemide (LASIX) 40 MG tablet Take 1 tablet (40 mg  total) by mouth 2 (two) times daily. 10/29/13   Lelon Perla, MD  losartan (COZAAR) 100 MG tablet Take 1 tablet (100 mg total) by mouth daily. 11/30/11   Lelon Perla, MD  metoprolol tartrate (LOPRESSOR) 25 MG tablet TAKE 1 TABLET BY MOUTH 2 TIMES DAILY 08/10/12   Lelon Perla, MD  nitroGLYCERIN (NITROSTAT) 0.4 MG SL tablet Place 1 tablet (0.4 mg total) under the tongue every 5 (five) minutes as needed for chest pain. 12/18/13   Lelon Perla, MD  OMEGA-3 FATTY ACIDS PO Take by mouth daily.    Historical Provider, MD  potassium chloride SA (K-DUR,KLOR-CON) 20 MEQ tablet Take 20 mEq by mouth daily.     Historical Provider, MD   Meds Ordered and Administered this Visit   Medications  triamcinolone acetonide (KENALOG-40) injection 40 mg (40 mg Intramuscular Given 01/01/15  1429)    BP 170/93 mmHg  Pulse 77  Temp(Src) 97.6 F (36.4 C) (Oral)  Resp 20  Ht '5\' 1"'$  (1.549 m)  Wt 209 lb (94.802 kg)  BMI 39.51 kg/m2  SpO2 92% No data found.   Physical Exam  Constitutional: She is oriented to person, place, and time. She appears well-developed and well-nourished. No distress.  Patient is obese (BMI 39.5)  HENT:  Head: Normocephalic.  Right Ear: Tympanic membrane normal.  Left Ear: Tympanic membrane normal.  Nose: Nose normal.  Mouth/Throat: Oropharynx is clear and moist.  Eyes: Conjunctivae are normal. Pupils are equal, round, and reactive to light.  Neck: Neck supple.  Cardiovascular: Normal heart sounds.   Pulmonary/Chest: Effort normal. No respiratory distress. She has wheezes. She has no rales.   She exhibits no tenderness.  Decreased breath sounds left chest. Area of right posterior chest pain marked in blue;  No tenderness to palpation over that area.  Abdominal: There is no tenderness.  Musculoskeletal: She exhibits edema.  Lymphadenopathy:    She has no cervical adenopathy.  Neurological: She is alert and oriented to person, place, and time.  Skin: Skin is warm and dry. No rash noted. She is not diaphoretic.  Nursing note and vitals reviewed.   ED Course  Procedures  None   Imaging Review Dg Chest 2 View  01/01/2015   CLINICAL DATA:  Cough, shortness of breath and runny nose for 2 weeks.  EXAM: CHEST  2 VIEW  COMPARISON:  October 01, 2013  FINDINGS: The heart size and mediastinal contours are stable. Surgical clips and sutures are identified in the left suprahilar region unchanged. There is chronic biapical thickening unchanged. The visualized skeletal structures are stable.  IMPRESSION: No active cardiopulmonary disease. No change compared to prior exam of October 01, 2013   Electronically Signed   By: Abelardo Diesel M.D.   On: 01/01/2015 13:57      MDM   1. Acute bronchitis, unspecified organism    Kenalog '40mg'$  IM Begin doxycycline '100mg'$   BID Take plain guaifenesin ('1200mg'$  extended release tabs such as Mucinex) twice daily, with plenty of water, for cough and congestion.  Get adequate rest.   Stop all antihistamines for now, and other non-prescription cough/cold preparations. Continue Symbicort.  Continue albuterol by nebulizer as needed.  May take Delsym Cough Suppressant at bedtime for nighttime cough.  Follow-up with family doctor if not improving about 7 to 10 days.    Kandra Nicolas, MD 01/02/15 [phone]

## 2015-01-01 NOTE — ED Notes (Signed)
Pt c/o nonproductive cough, SOB, and runny nose x 2 wks. No OTC meds. Denies fever.

## 2015-01-05 ENCOUNTER — Telehealth: Payer: Self-pay

## 2015-01-15 ENCOUNTER — Telehealth: Payer: Self-pay | Admitting: *Deleted

## 2015-01-15 NOTE — Telephone Encounter (Signed)
Spoke with pt, Aware of dr Jacalyn Lefevre recommendations.  Follow up scheduled 01-16-15 with scott we[facility].

## 2015-01-15 NOTE — Telephone Encounter (Signed)
PAOV or ER is she is concerned about symptoms Brandy Cameron

## 2015-01-15 NOTE — Progress Notes (Signed)
Cardiology Office Note   Date:  01/16/2015   ID:  Brandy Cameron, DOB [birth year], MRN [id number]  PCP:  Chesley Noon, MD  Cardiologist:  Dr. Kirk Ruths   Electrophysiologist:  n/a  Chief Complaint  Patient presents with  . Chest Pain     History of Present Illness: Brandy Cameron is a 79 y.o. female with a hx of CAD, diastolic HF, HTN, HL.  Last seen by Dr. Kirk Ruths in 7/15. She called in yesterday with chest pain that was NTG responsive. She is added on today for further evaluation.   Here today with her sister. 2 nights ago, she was awoken by severe substernal chest tightness. This radiated up into her jaw and her ears. She had associated diaphoresis. She took 2 nitroglycerin with prompt relief. Symptoms lasted 10-15 minutes. She does note eating macaroni salad before bed. This had a lot of peppers in it. She had a lot of belching with her chest discomfort. She denies nausea. She denies syncope. She has chronic dyspnea with exertion. She is NYHA 3. She wears oxygen at night. She sleeps on one pillow. She denies significant increasing LE edema.   Studies/Reports Reviewed Today:  Echo 7/15 Mild LVH, mild focal basal septal hypertrophy, EF 50-55%, no RWMA, Gr 1 DD  Carotid US 7/15 No significant stenosis  Myoview 9/13 Normal stress nuclear study.  LV Ejection Fraction: 71%. LV Wall Motion: NL LV Function; NL Wall Motion  LHC 9/11 LM: OK LAD:  irregs LCx:  Mid 50% RCA:  prox 50% LV - inf-apical AK   Past Medical History  Diagnosis Date  . NSTEMI (non-ST elevated myocardial infarction)     with only mild to moderate CAD in September of 2011,There was concern for apival ballooning. >> ? Tako-Tsubo syndrome vs vasospasm  . HTN (hypertension)   . COPD (chronic obstructive pulmonary disease)   . Lung cancer     [name]  Marland Kitchen Asthma   . CAD (coronary artery disease)    a. LHC 9/11 (in setting of NSTEMI): LAD irregs, mLCx 50, pRCA 50, inf-apical AK;  b. Myoview 9/13: Normal stress nuclear study.  LV Ejection Fraction: 71%   . Chronic diastolic CHF (congestive heart failure)     a. Echo 7/15: Mild LVH, mild focal basal septal hypertrophy, EF 50-55%, no RWMA, Gr 1 DD  . Carotid stenosis     a. Carotid US 9/13: bilat 0-39%;  b. Carotid US 7/15: no sig stenosis    Past Surgical History  Procedure Laterality Date  . Knee surgery    . Vesicovaginal fistula closure w/ tah    . Gallbladder surgery    . Cataract surgery    . Foot surgery    . Lung lobectomy       Current Outpatient Prescriptions  Medication Sig Dispense Refill  . aspirin 81 MG tablet Take 81 mg by mouth daily.      Marland Kitchen atorvastatin (LIPITOR) 20 MG tablet Take 1 tablet (20 mg total) by mouth daily. 90 tablet 3  . budesonide-formoterol (SYMBICORT) 160-4.5 MCG/ACT inhaler Inhale 2 puffs into the lungs 2 (two) times daily.    . calcium carbonate (OS-CAL) 600 MG TABS tablet Take 600 mg by mouth daily with breakfast.    . Coenzyme Q10 (CO Q-10) 100 MG CAPS Take 1 tablet by mouth daily.    . furosemide (LASIX) 40 MG tablet  Take 1 tablet (40 mg total) by mouth 2 (two) times daily. 60 tablet 12  . losartan (COZAAR) 100 MG tablet Take 1 tablet (100 mg total) by mouth daily. 30 tablet 12  . metoprolol tartrate (LOPRESSOR) 25 MG tablet TAKE 1 TABLET BY MOUTH 2 TIMES DAILY 60 tablet 2  . nitroGLYCERIN (NITROSTAT) 0.4 MG SL tablet Place 1 tablet (0.4 mg total) under the tongue every 5 (five) minutes as needed for chest pain. 25 tablet 12  . OMEGA-3 FATTY ACIDS PO Take by mouth daily.    . potassium chloride SA (K-DUR,KLOR-CON) 20 MEQ tablet Take 20 mEq by mouth daily.     . isosorbide mononitrate (IMDUR) 30 MG 24 hr tablet Take 1 tablet (30 mg total) by mouth daily. 30 tablet 11  . pantoprazole (PROTONIX) 40 MG tablet Take 1 tablet (40 mg total) by mouth as directed. 40 MG DAILY X 2 WEEKS THEN ONLY AS NEEDED  30 tablet 3   No current facility-administered medications for this visit.    Allergies:   Sulfa antibiotics; Amlodipine; Pantoprazole; and Sulfonamide derivatives    Social History:  The patient  reports that she has quit smoking. She has never used smokeless tobacco. She reports that she does not drink alcohol or use illicit drugs.   Family History:  The patient's family history includes Asthma in her mother; Cancer in her mother; Diabetes in her mother; Heart disease in an other family member; Heart disease (age of onset: 20) in her mother; Pancreatitis in an other family member.    ROS:   Please see the history of present illness.   Review of Systems  Constitution: Positive for diaphoresis.  Cardiovascular: Positive for chest pain, dyspnea on exertion and leg swelling.  Respiratory: Positive for cough and wheezing.   Musculoskeletal: Positive for back pain and myalgias.  All other systems reviewed and are negative.     PHYSICAL EXAM: VS:  BP 140/78 mmHg  Pulse 79  Ht '5\' 1"'$  (1.549 m)  Wt 206 lb 6.4 oz (93.622 kg)  BMI 39.02 kg/m2  SpO2 92%    Wt Readings from Last 3 Encounters:  01/16/15 206 lb 6.4 oz (93.622 kg)  01/01/15 209 lb (94.802 kg)  11/22/14 205 lb (92.987 kg)     GEN: Well nourished, well developed, in no acute distress HEENT: normal Neck: no JVD,  no masses Cardiac:  Normal S1/S2, RRR; no murmur ,  no rubs or gallops, no edema   Respiratory:  clear to auscultation bilaterally, no wheezing, rhonchi or rales. GI: soft, nontender, nondistended, + BS MS: no deformity or atrophy Skin: warm and dry  Neuro:  CNs II-XII intact, Strength and sensation are intact Psych: Normal affect   EKG:  EKG is ordered today.  It demonstrates:   NSR, HR 79, QTc 428   Recent Labs: 11/22/2014: Hemoglobin 14.6; Platelets 281    Lipid Panel    Component Value Date/Time   CHOL 154 08/30/2013 0550   TRIG 129 08/30/2013 0550   HDL 51 08/30/2013 0550   CHOLHDL 3.0  08/30/2013 0550   VLDL 26 08/30/2013 0550   LDLCALC 77 08/30/2013 0550      ASSESSMENT AND PLAN:  1. Chest Pain:  Symptoms are quite concerning for angina. They went on for 10-15 minutes with radiation up into her jaw and ears. She had prompt relief with nitroglycerin 2. She has not had symptoms since.  I had a long conversation with her and her sister regarding further evaluation.  I did recommend proceeding with cardiac catheterization. However, she would like to hold off on this for now. She does remember significant belching with her symptoms and she did have a spicy meal prior to bedtime. It is certainly possible that her symptoms are related to esophageal spasm and dyspepsia.  - Obtain BMET, CBC, BNP, troponin.  - If Troponin abnormal, she will be referred to the ED for admission and LHC tomorrow.  - If Troponin normal >> proceed with Lexiscan Myoview for risk stratification  - Start Imdur 30 mg QD  - Protonix 40 mg QD x 2 weeks, then prn  - Go to ED if symptoms recur.  2. CAD:  Non-obstructive CAD at cath in 2011.  Patient presented at that time with a NSTEMI related to Tako-Tsubo syndrome vs vasospasm.  As noted, I am concerned about the quality of her symptoms.  However, she does have some symptoms that sound GI related.  Check Troponin, Myoview as noted.  Start Imdur.  Continue ASA, statin, angiotensin receptor blocker, beta-blocker.  3. Chronic Diastolic CHF:  Volume appears stable. I do not think that volume excess is a cause of her symptoms.  Will check BNP. If elevated, I will adjust her diuretics.  4. HTN:  BP elevated. Add Imdur as noted.   5. Hyperlipidemia:  Continue statin.  She has had some myalgias.  But, these are better with CoQ10.  6. COPD:  She has chronic dyspnea.  She is not wheezing and did not have significant cough when she awoke the other night. I do not think that her symptoms represent a COPD exacerbation.     Medication Changes: Current medicines are  reviewed at length with the patient today.  Concerns regarding medicines are as outlined above.  The following changes have been made:   Discontinued Medications   DOXYCYCLINE (VIBRAMYCIN) 100 MG CAPSULE    Take 1 capsule (100 mg total) by mouth 2 (two) times daily. Take with food.   Modified Medications   No medications on file   Carles Florea Prescriptions   ISOSORBIDE MONONITRATE (IMDUR) 30 MG 24 HR TABLET    Take 1 tablet (30 mg total) by mouth daily.   PANTOPRAZOLE (PROTONIX) 40 MG TABLET    Take 1 tablet (40 mg total) by mouth as directed. 40 MG DAILY X 2 WEEKS THEN ONLY AS NEEDED    Labs/ tests ordered today include:   Orders Placed This Encounter  Procedures  . Basic Metabolic Panel (BMET)  . CBC w/Diff  . B Nat Peptide  . Troponin I  . Myocardial Perfusion Imaging  . EKG 12-Lead      Disposition:    FU with Dr. Kirk Ruths 2 weeks.    Signed, Versie Starks, MHS 01/16/2015 4:16 PM    [facility] [place], [place], [place]  [address] Phone: [phone]; Fax: [phone]

## 2015-01-15 NOTE — Telephone Encounter (Signed)
Spoke with pt, she woke with chest pain at 2 am. She also had SOB and sweats. She took 2 NTG with relief. She had a tightness in her chest when she got up this morning but it went away on its own. She is pain free at present. She would like to be seen today. Will discuss with dr Stanford Breed

## 2015-01-16 ENCOUNTER — Telehealth: Payer: Self-pay | Admitting: *Deleted

## 2015-01-16 ENCOUNTER — Ambulatory Visit (INDEPENDENT_AMBULATORY_CARE_PROVIDER_SITE_OTHER): Payer: Medicare Other | Admitting: Physician Assistant

## 2015-01-16 ENCOUNTER — Encounter: Payer: Self-pay | Admitting: Physician Assistant

## 2015-01-16 VITALS — BP 140/78 | HR 79 | Ht 61.0 in | Wt 206.4 lb

## 2015-01-16 DIAGNOSIS — I2510 Atherosclerotic heart disease of native coronary artery without angina pectoris: Secondary | ICD-10-CM

## 2015-01-16 DIAGNOSIS — I5032 Chronic diastolic (congestive) heart failure: Secondary | ICD-10-CM | POA: Diagnosis not present

## 2015-01-16 DIAGNOSIS — R0789 Other chest pain: Secondary | ICD-10-CM | POA: Diagnosis not present

## 2015-01-16 DIAGNOSIS — R072 Precordial pain: Secondary | ICD-10-CM

## 2015-01-16 DIAGNOSIS — I10 Essential (primary) hypertension: Secondary | ICD-10-CM

## 2015-01-16 DIAGNOSIS — J449 Chronic obstructive pulmonary disease, unspecified: Secondary | ICD-10-CM

## 2015-01-16 DIAGNOSIS — E785 Hyperlipidemia, unspecified: Secondary | ICD-10-CM

## 2015-01-16 LAB — BASIC METABOLIC PANEL
BUN: 16 mg/dL (ref 6–23)
CALCIUM: 9.5 mg/dL (ref 8.4–10.5)
CO2: 34 mEq/L — ABNORMAL HIGH (ref 19–32)
Chloride: 103 mEq/L (ref 96–112)
Creatinine, Ser: 0.73 mg/dL (ref 0.40–1.20)
GFR: 82.03 mL/min (ref >60.00)
Glucose, Bld: 104 mg/dL — ABNORMAL HIGH (ref 70–99)
Potassium: 4 mEq/L (ref 3.5–5.1)
SODIUM: 145 meq/L (ref 135–145)

## 2015-01-16 LAB — BRAIN NATRIURETIC PEPTIDE: Pro B Natriuretic peptide (BNP): 33 pg/mL (ref 0.0–100.0)

## 2015-01-16 LAB — TROPONIN I: TNIDX: 0 ug/l (ref 0.00–0.06)

## 2015-01-16 MED ORDER — ISOSORBIDE MONONITRATE ER 30 MG PO TB24: 30.0000 mg | ORAL_TABLET | Freq: Every day | ORAL | Status: DC

## 2015-01-16 MED ORDER — PANTOPRAZOLE SODIUM 40 MG PO TBEC: 40.0000 mg | DELAYED_RELEASE_TABLET | ORAL | Status: DC

## 2015-01-16 NOTE — Telephone Encounter (Signed)
Pt notified negative troponin I. Pt verbalized understanding to results given by phone today.

## 2015-01-16 NOTE — Patient Instructions (Signed)
Medication Instructions:  1. START IMDUR 30 MG DAILY; RX SENT  2. START PROTONIX 40 MG DAILY FOR 2 WEEKS THEN AS NEEDED  Labwork: TODAY BMET, CBC W/DIFF, BNP, AND STAT TROPONIN I  Testing/Procedures: Your physician has requested that you have a lexiscan myoview ASAP Reid Hope King, PAC DX CAD, CHEST PAIN.Marland Kitchen For further information please visit [email or web address]. Please follow instruction sheet, as given.   Follow-Up: 2 WEEKS WITH DR. CRENSHAW OR NP/PA SAME DAY DR. Stanford Breed IS IN THE OFFICE  Any Other Special Instructions Will Be Listed Below (If Applicable). YOU HAVE BEEN ADVISED TO GO TO THE ED OF YOUR SYMPTOMS REOCCUR OR DO NOT GET ANY BETTER

## 2015-01-17 ENCOUNTER — Telehealth (HOSPITAL_COMMUNITY): Payer: Self-pay | Admitting: *Deleted

## 2015-01-17 ENCOUNTER — Telehealth: Payer: Self-pay | Admitting: *Deleted

## 2015-01-17 LAB — CBC WITH DIFFERENTIAL/PLATELET
BASOS ABS: 0.1 10*3/uL (ref 0.0–0.1)
Basophils Relative: 0.6 % (ref 0.0–3.0)
Eosinophils Absolute: 0.5 10*3/uL (ref 0.0–0.7)
Eosinophils Relative: 2.9 % (ref 0.0–5.0)
HEMATOCRIT: 45.6 % (ref 36.0–46.0)
Hemoglobin: 15 g/dL (ref 12.0–15.0)
LYMPHS PCT: 18.3 % (ref 12.0–46.0)
Lymphs Abs: 3.1 10*3/uL (ref 0.7–4.0)
MCHC: 32.9 g/dL (ref 30.0–36.0)
MCV: 87.8 fl (ref 78.0–100.0)
MONOS PCT: 7.4 % (ref 3.0–12.0)
Monocytes Absolute: 1.2 10*3/uL — ABNORMAL HIGH (ref 0.1–1.0)
Neutro Abs: 11.8 10*3/uL — ABNORMAL HIGH (ref 1.4–7.7)
Neutrophils Relative %: 70.8 % (ref 43.0–77.0)
Platelets: 161 10*3/uL (ref 150.0–400.0)
RBC: 5.19 Mil/uL — AB (ref 3.87–5.11)
RDW: 14.6 % (ref 11.5–15.5)
WBC: 16.7 10*3/uL — ABNORMAL HIGH (ref 4.0–10.5)

## 2015-01-17 NOTE — Telephone Encounter (Signed)
Patient given detailed instructions per Myocardial Perfusion Study Information Sheet for test on 01/21/15 at 12:30. Patient notified to arrive 15 minutes early and that it is imperative to arrive on time for appointment to keep from having the test rescheduled.  If you need to cancel or reschedule your appointment, please call the office within 24 hours of your appointment. Failure to do so may result in a cancellation of your appointment, and a $50 no show fee. Patient verbalized understanding. Veronia Beets

## 2015-01-17 NOTE — Telephone Encounter (Signed)
Pt notified of lab results. Pt state she just had CXR and prednisone injection 01/01/15. I then d/w PA to further advise. Pt cb and notified per Brynda Rim. PA then to just f/u w/PCP about elevated WBC. Pt agreeable to plan of care.

## 2015-01-21 ENCOUNTER — Encounter (HOSPITAL_COMMUNITY): Payer: Medicare Other

## 2015-01-30 ENCOUNTER — Ambulatory Visit: Payer: Medicare Other | Admitting: Physician Assistant

## 2015-02-07 ENCOUNTER — Other Ambulatory Visit: Payer: Self-pay

## 2015-02-07 DIAGNOSIS — I2510 Atherosclerotic heart disease of native coronary artery without angina pectoris: Secondary | ICD-10-CM

## 2015-02-07 MED ORDER — ATORVASTATIN CALCIUM 20 MG PO TABS: 20.0000 mg | ORAL_TABLET | Freq: Every day | ORAL | Status: DC

## 2015-02-24 NOTE — Progress Notes (Signed)
HPI: FU CAD, diastolic HF, HTN, HL.Scheduled for nuclear study in September 2016 secondary to chest pain but not performed. Since last seen, She does have dyspnea on exertion that she attributes to COPD. She does not have pedal edema. She has had no recurrent chest pain.   Studies/Reports Reviewed Today:  Echo 7/15 Mild LVH, mild focal basal septal hypertrophy, EF 50-55%, no RWMA, Gr 1 DD  Carotid US 7/15 No significant stenosis  Myoview 9/13 Normal stress nuclear study. LV Ejection Fraction: 71%. LV Wall Motion: NL LV Function; NL Wall Motion  LHC 9/11 LM: OK LAD: irregs LCx: Mid 50% RCA: prox 50% LV - inf-apical AK  Current Outpatient Prescriptions  Medication Sig Dispense Refill  . aspirin 81 MG tablet Take 81 mg by mouth daily.      Marland Kitchen atorvastatin (LIPITOR) 20 MG tablet Take 1 tablet (20 mg total) by mouth daily. 90 tablet 3  . budesonide-formoterol (SYMBICORT) 160-4.5 MCG/ACT inhaler Inhale 2 puffs into the lungs 2 (two) times daily.    . calcium carbonate (OS-CAL) 600 MG TABS tablet Take 600 mg by mouth daily with breakfast.    . Coenzyme Q10 (CO Q-10) 100 MG CAPS Take 1 tablet by mouth daily.    . furosemide (LASIX) 40 MG tablet Take 1 tablet (40 mg total) by mouth 2 (two) times daily. 60 tablet 12  . isosorbide mononitrate (IMDUR) 30 MG 24 hr tablet Take 1 tablet (30 mg total) by mouth daily. 30 tablet 11  . losartan (COZAAR) 100 MG tablet Take 1 tablet (100 mg total) by mouth daily. 30 tablet 12  . metoprolol tartrate (LOPRESSOR) 25 MG tablet TAKE 1 TABLET BY MOUTH 2 TIMES DAILY 60 tablet 2  . nitroGLYCERIN (NITROSTAT) 0.4 MG SL tablet Place 1 tablet (0.4 mg total) under the tongue every 5 (five) minutes as needed for chest pain. 25 tablet 12  . OMEGA-3 FATTY ACIDS PO Take by mouth daily.    . pantoprazole (PROTONIX) 40 MG tablet Take 1 tablet (40 mg total) by mouth as directed. 40 MG DAILY X 2 WEEKS THEN ONLY AS NEEDED 30 tablet 3  . potassium chloride SA  (K-DUR,KLOR-CON) 20 MEQ tablet Take 20 mEq by mouth daily.      No current facility-administered medications for this visit.     Past Medical History  Diagnosis Date  . NSTEMI (non-ST elevated myocardial infarction) ([place])     with only mild to moderate CAD in September of 2011,There was concern for apival ballooning. >> ? Tako-Tsubo syndrome vs vasospasm  . HTN (hypertension)   . COPD (chronic obstructive pulmonary disease) ([place])   . Lung cancer [facility])     [name]  Marland Kitchen Asthma   . CAD (coronary artery disease)     a. LHC 9/11 (in setting of NSTEMI): LAD irregs, mLCx 50, pRCA 50, inf-apical AK;  b. Myoview 9/13: Normal stress nuclear study.  LV Ejection Fraction: 71%   . Chronic diastolic CHF (congestive heart failure) ([place])     a. Echo 7/15: Mild LVH, mild focal basal septal hypertrophy, EF 50-55%, no RWMA, Gr 1 DD  . Carotid stenosis     a. Carotid US 9/13: bilat 0-39%;  b. Carotid US 7/15: no sig stenosis    Past Surgical History  Procedure Laterality Date  . Knee surgery    . Vesicovaginal fistula closure w/ tah    .  Gallbladder surgery    . Cataract surgery    . Foot surgery    . Lung lobectomy      Social History   Social History  . Marital Status: Divorced    Spouse Name: N/A  . Number of Children: N/A  . Years of Education: N/A   Occupational History  . Not on file.   Social History Main Topics  . Smoking status: Former Research scientist (life sciences)  . Smokeless tobacco: Never Used     Comment: She stopped smoking four years ago.  . Alcohol Use: No  . Drug Use: No  . Sexual Activity: Not on file   Other Topics Concern  . Not on file   Social History Narrative    ROS: no fevers or chills, productive cough, hemoptysis, dysphasia, odynophagia, melena, hematochezia, dysuria, hematuria, rash, seizure activity, orthopnea, PND, pedal edema, claudication. Remaining systems are negative.  Physical  Exam: Well-developed obese in no acute distress.  Skin is warm and dry.  HEENT is normal.  Neck is supple.  Chest with mild exp wheeze Cardiovascular exam is regular rate and rhythm.  Abdominal exam nontender or distended. No masses palpated. Extremities show no edema. neuro grossly intact  ECG 01/16/2015-sinus rhythm, left axis deviation.

## 2015-02-25 ENCOUNTER — Encounter: Payer: Self-pay | Admitting: Cardiology

## 2015-02-25 ENCOUNTER — Ambulatory Visit (INDEPENDENT_AMBULATORY_CARE_PROVIDER_SITE_OTHER): Payer: Medicare Other | Admitting: Cardiology

## 2015-02-25 VITALS — BP 124/62 | HR 70 | Ht 61.0 in | Wt 210.3 lb

## 2015-02-25 DIAGNOSIS — I10 Essential (primary) hypertension: Secondary | ICD-10-CM

## 2015-02-25 DIAGNOSIS — E785 Hyperlipidemia, unspecified: Secondary | ICD-10-CM

## 2015-02-25 DIAGNOSIS — I5031 Acute diastolic (congestive) heart failure: Secondary | ICD-10-CM

## 2015-02-25 DIAGNOSIS — I2583 Coronary atherosclerosis due to lipid rich plaque: Principal | ICD-10-CM

## 2015-02-25 DIAGNOSIS — I2510 Atherosclerotic heart disease of native coronary artery without angina pectoris: Secondary | ICD-10-CM | POA: Diagnosis not present

## 2015-02-25 DIAGNOSIS — I679 Cerebrovascular disease, unspecified: Secondary | ICD-10-CM

## 2015-02-25 NOTE — Assessment & Plan Note (Signed)
Continue aspirin and statin. 

## 2015-02-25 NOTE — Assessment & Plan Note (Signed)
Patient is euvolemic on examination.Continue present dose of Lasix. 

## 2015-02-25 NOTE — Patient Instructions (Signed)
Your physician wants you to follow-up in: 6 MONTHS WITH DR CRENSHAW You will receive a reminder letter in the mail two months in advance. If you don't receive a letter, please call our office to schedule the follow-up appointment.  

## 2015-02-25 NOTE — Assessment & Plan Note (Signed)
Continue aspirin and statin. She did have some chest pain back in September and a stress test was ordered. However she does not want to pursue this as her symptoms have resolved and she is "afraid" of having a stress test.

## 2015-02-25 NOTE — Assessment & Plan Note (Signed)
Blood pressure controlled. Continue present medications. 

## 2015-02-25 NOTE — Assessment & Plan Note (Signed)
Continue statin. She did not tolerate higher doses of Lipitor previously.

## 2015-05-23 ENCOUNTER — Emergency Department
Admission: EM | Admit: 2015-05-23 | Discharge: 2015-05-23 | Disposition: A | Discharge status: Home / Self Care | Payer: Medicare Other | Source: Home / Self Care | Attending: Family Medicine | Admitting: Family Medicine

## 2015-05-23 ENCOUNTER — Encounter: Payer: Self-pay | Admitting: *Deleted

## 2015-05-23 DIAGNOSIS — R0981 Nasal congestion: Secondary | ICD-10-CM | POA: Diagnosis not present

## 2015-05-23 MED ORDER — FLUTICASONE PROPIONATE 50 MCG/ACT NA SUSP: 2.0000 | Freq: Every day | NASAL | Status: DC

## 2015-05-23 MED ORDER — AZITHROMYCIN 250 MG PO TABS: 250.0000 mg | ORAL_TABLET | Freq: Every day | ORAL | Status: DC

## 2015-05-23 NOTE — Discharge Instructions (Signed)
You may take '400mg'$  Ibuprofen (Motrin) every 6-8 hours for fever and pain  Alternate with Tylenol  You may take '500mg'$  Tylenol every 4-6 hours as needed for fever and pain  Follow-up with your primary care provider next week for recheck of symptoms if not improving.  Be sure to drink plenty of fluids and rest, at least 8hrs of sleep a night, preferably more while you are sick. Return urgent care or go to closest ER if you cannot keep down fluids/signs of dehydration, fever not reducing with Tylenol, difficulty breathing/wheezing, stiff neck, worsening condition, or other concerns (see below)    Your symptoms today appear to be due to a virus that causes the common cold.  Please try using Flonase daily and you may use over the counter Afrin for 3 days while staying well hydrated and getting plenty of rest.  If symptoms continue to worsen including fever, headache, worsening cough with shortness of breath, you may fill the prescription for Azithromycin (an antibiotic)  If you start taking the antibiotic, please take antibiotics as prescribed and be sure to complete entire course even if you start to feel better to ensure infection does not come back.

## 2015-05-23 NOTE — ED Provider Notes (Signed)
CSN: [id number]     Arrival date & time 05/23/15  0909 History   First MD Initiated Contact with Patient 05/23/15 0915     Chief Complaint  Patient presents with  . Nasal Congestion   (Consider location/radiation/quality/duration/timing/severity/associated sxs/prior Treatment) HPI Pt is a 80yo female presenting to [facility] with c/o mild to moderate nasal congestion and watery eyes with postnasal drip and sneezing for 1 day.  She states she believes it is her sinuses.  She took 1 tab of left over antibiotic "c-e-l-something."  She also reports using a nasal spray yesterday but does not recall name of the nasal spray.  No relief of home treatments tried. Denies cough, fever, chills, n/v/d. Denies new SOB. Pt states she is always mildly SOB due to her COPD.    Past Medical History  Diagnosis Date  . NSTEMI (non-ST elevated myocardial infarction) ([place])     with only mild to moderate CAD in September of 2011,There was concern for apival ballooning. >> ? Tako-Tsubo syndrome vs vasospasm  . HTN (hypertension)   . COPD (chronic obstructive pulmonary disease) ([place])   . Lung cancer [facility])     [name]  Marland Kitchen Asthma   . CAD (coronary artery disease)     a. LHC 9/11 (in setting of NSTEMI): LAD irregs, mLCx 50, pRCA 50, inf-apical AK;  b. Myoview 9/13: Normal stress nuclear study.  LV Ejection Fraction: 71%   . Chronic diastolic CHF (congestive heart failure) ([place])     a. Echo 7/15: Mild LVH, mild focal basal septal hypertrophy, EF 50-55%, no RWMA, Gr 1 DD  . Carotid stenosis     a. Carotid US 9/13: bilat 0-39%;  b. Carotid US 7/15: no sig stenosis   Past Surgical History  Procedure Laterality Date  . Knee surgery    . Vesicovaginal fistula closure w/ tah    . Gallbladder surgery    . Cataract surgery    . Foot surgery    . Lung lobectomy     Family History  Problem Relation Age of Onset  . Heart disease      POSITIVE  FAMILY HX. OF   . Pancreatitis      QUESTIONABLE FROM ACE INHIBITORS  . Heart disease Mother 54  . Asthma Mother   . Diabetes Mother   . Cancer Mother    Social History  Substance Use Topics  . Smoking status: Former Research scientist (life sciences)  . Smokeless tobacco: Never Used     Comment: She stopped smoking four years ago.  . Alcohol Use: No   OB History    No data available     Review of Systems  Constitutional: Negative for fever, chills, appetite change and fatigue.  HENT: Positive for congestion, postnasal drip, rhinorrhea and sneezing. Negative for ear pain, sinus pressure, sore throat, trouble swallowing and voice change.   Respiratory: Negative for cough and shortness of breath.   Gastrointestinal: Negative for nausea, vomiting, abdominal pain and diarrhea.  Musculoskeletal: Negative for myalgias and arthralgias.  Neurological: Negative for dizziness, light-headedness and headaches.    Allergies  Sulfa antibiotics; Amlodipine; Pantoprazole; and Sulfonamide derivatives  Home Medications   Prior to Admission medications   Medication Sig Start Date End Date Taking? Authorizing Provider  aspirin 81 MG tablet Take 81 mg by mouth daily.      Historical Provider, MD  atorvastatin (LIPITOR) 20 MG tablet Take 1 tablet (20  mg total) by mouth daily. 02/07/15   Liliane Shi, PA-C  azithromycin (ZITHROMAX) 250 MG tablet Take 1 tablet (250 mg total) by mouth daily. Take first 2 tablets together, then 1 every day until finished. 05/23/15   Noland Fordyce, PA-C  budesonide-formoterol (SYMBICORT) 160-4.5 MCG/ACT inhaler Inhale 2 puffs into the lungs 2 (two) times daily.    Historical Provider, MD  calcium carbonate (OS-CAL) 600 MG TABS tablet Take 600 mg by mouth daily with breakfast.    Historical Provider, MD  Coenzyme Q10 (CO Q-10) 100 MG CAPS Take 1 tablet by mouth daily.    Historical Provider, MD  fluticasone (FLONASE) 50 MCG/ACT nasal spray Place 2 sprays into both nostrils daily. 05/23/15   Noland Fordyce, PA-C  furosemide (LASIX) 40 MG tablet Take 1 tablet (40 mg total) by mouth 2 (two) times daily. 10/29/13   Lelon Perla, MD  isosorbide mononitrate (IMDUR) 30 MG 24 hr tablet Take 1 tablet (30 mg total) by mouth daily. 01/16/15   Liliane Shi, PA-C  losartan (COZAAR) 100 MG tablet Take 1 tablet (100 mg total) by mouth daily. 11/30/11   Lelon Perla, MD  metoprolol tartrate (LOPRESSOR) 25 MG tablet TAKE 1 TABLET BY MOUTH 2 TIMES DAILY 08/10/12   Lelon Perla, MD  nitroGLYCERIN (NITROSTAT) 0.4 MG SL tablet Place 1 tablet (0.4 mg total) under the tongue every 5 (five) minutes as needed for chest pain. 12/18/13   Lelon Perla, MD  OMEGA-3 FATTY ACIDS PO Take by mouth daily.    Historical Provider, MD  pantoprazole (PROTONIX) 40 MG tablet Take 1 tablet (40 mg total) by mouth as directed. 40 MG DAILY X 2 WEEKS THEN ONLY AS NEEDED 01/16/15   Liliane Shi, PA-C  potassium chloride SA (K-DUR,KLOR-CON) 20 MEQ tablet Take 20 mEq by mouth daily.     Historical Provider, MD   Meds Ordered and Administered this Visit  Medications - No data to display  BP 148/84 mmHg  Pulse 89  Temp(Src) 97.5 F (36.4 C) (Oral)  Resp 18  Ht '5\' 1"'$  (1.549 m)  Wt 205 lb (92.987 kg)  BMI 38.75 kg/m2  SpO2 93% No data found.   Physical Exam  Constitutional: She appears well-developed and well-nourished. No distress.  HENT:  Head: Normocephalic and atraumatic.  Right Ear: Hearing, tympanic membrane, external ear and ear canal normal.  Left Ear: Hearing, tympanic membrane, external ear and ear canal normal.  Nose: Mucosal edema present. Right sinus exhibits no maxillary sinus tenderness and no frontal sinus tenderness. Left sinus exhibits no maxillary sinus tenderness and no frontal sinus tenderness.  Mouth/Throat: Uvula is midline, oropharynx is clear and moist and mucous membranes are normal.  Eyes: Conjunctivae are normal. No scleral icterus.  Neck: Normal range of motion. Neck supple.   Cardiovascular: Normal rate, regular rhythm and normal heart sounds.   Pulmonary/Chest: Effort normal and breath sounds normal. No respiratory distress. She has no wheezes. She has no rales. She exhibits no tenderness.  Abdominal: Soft. She exhibits no distension and no mass. There is no tenderness. There is no rebound and no guarding.  Musculoskeletal: Normal range of motion.  Neurological: She is alert.  Skin: Skin is warm and dry. She is not diaphoretic.  Nursing note and vitals reviewed.   ED Course  Procedures (including critical care time)  Labs Review Labs Reviewed - No data to display  Imaging Review No results found.    MDM   1. Nasal  congestion    O2 Sat 93% on RA.  Pt denies SOB. States her oxygen is always low due to COPD. She does wear O2 at night.   Pt c/o 1 day of nasal congestion. Denies cough, fever, chills. No evidence of bacterial infection at this time. Pt reassured symptoms are likely viral in nature, such as the common cold.   Pt insists symptoms will worsen.  Home care instructions provided. Rx: Flonase to use.  Azithromycin prescription given to hold. Encouraged to only fill if symptoms worsening over the next 4-5 days including fever, cough, or headaches. May also use OTC Afrin for 3-4 days for nasal congestion  Advised pt to use acetaminophen and ibuprofen as needed for fever and pain. Encouraged rest and fluids. F/u with PCP in 5-7 days if not improving, sooner if worsening. Pt verbalized understanding and agreement with tx plan.   Noland Fordyce, PA-C 05/23/15 1013

## 2015-05-23 NOTE — ED Notes (Signed)
Pt c/o runny nose, watery eyes, post nasal drip and sneezing x 1 day. Denies fever.

## 2015-05-25 ENCOUNTER — Telehealth: Payer: Self-pay | Admitting: Emergency Medicine

## 2015-05-26 ENCOUNTER — Encounter: Payer: Self-pay | Admitting: *Deleted

## 2015-05-26 ENCOUNTER — Emergency Department
Admission: EM | Admit: 2015-05-26 | Discharge: 2015-05-26 | Disposition: A | Discharge status: Home / Self Care | Payer: Medicare Other | Source: Home / Self Care | Attending: Family Medicine | Admitting: Family Medicine

## 2015-05-26 DIAGNOSIS — H6993 Unspecified Eustachian tube disorder, bilateral: Secondary | ICD-10-CM | POA: Diagnosis not present

## 2015-05-26 DIAGNOSIS — J069 Acute upper respiratory infection, unspecified: Secondary | ICD-10-CM

## 2015-05-26 DIAGNOSIS — B9789 Other viral agents as the cause of diseases classified elsewhere: Secondary | ICD-10-CM

## 2015-05-26 DIAGNOSIS — J441 Chronic obstructive pulmonary disease with (acute) exacerbation: Secondary | ICD-10-CM | POA: Diagnosis not present

## 2015-05-26 DIAGNOSIS — I951 Orthostatic hypotension: Secondary | ICD-10-CM | POA: Diagnosis not present

## 2015-05-26 LAB — POCT CBC W AUTO DIFF (K'VILLE URGENT CARE)

## 2015-05-26 MED ORDER — PREDNISONE 20 MG PO TABS: 20.0000 mg | ORAL_TABLET | Freq: Two times a day (BID) | ORAL | Status: DC

## 2015-05-26 MED ORDER — IPRATROPIUM-ALBUTEROL 0.5-2.5 (3) MG/3ML IN SOLN
3.0000 mL | Freq: Once | RESPIRATORY_TRACT | Status: AC
  Administered 2015-05-26: 3 mL via RESPIRATORY_TRACT

## 2015-05-26 MED ORDER — METHYLPREDNISOLONE SODIUM SUCC 125 MG IJ SOLR
80.0000 mg | Freq: Once | INTRAMUSCULAR | Status: AC
  Administered 2015-05-26: 80 mg via INTRAMUSCULAR

## 2015-05-26 MED ORDER — IPRATROPIUM-ALBUTEROL 0.5-2.5 (3) MG/3ML IN SOLN: 3.0000 mL | Freq: Four times a day (QID) | RESPIRATORY_TRACT | Status: DC

## 2015-05-26 NOTE — Discharge Instructions (Signed)
Begin prednisone Tuesday 05/27/15. Take plain guaifenesin ('1200mg'$  extended release tabs such as Mucinex) twice daily, with plenty of water, for cough and congestion. Get adequate rest.   May use Afrin nasal spray (or generic oxymetazoline) twice daily for about 5 days and then discontinue.  Also recommend using saline nasal spray several times daily and saline nasal irrigation (AYR is a common brand).  Use Flonase nasal spray each morning after using Afrin nasal spray and saline nasal irrigation. Try warm salt water gargles for sore throat.  Stop all antihistamines for now, and other non-prescription cough/cold preparations. Continue and finish Azithromycin. Continue nebulizer and inhalers at home as prescribed. Recommend followup by a pulmonologist for long term management of your COPD

## 2015-05-26 NOTE — ED Notes (Signed)
Pt was seen 3 days ago, started zpack the day she was seen. Reports she is worse now with ear pain and itching, HA, productive cough,nasal congestion and worse SOB. H/o COPD. Afebrile.

## 2015-05-26 NOTE — ED Provider Notes (Signed)
CSN: [id number]     Arrival date & time 05/26/15  3875 History   First MD Initiated Contact with Patient 05/26/15 1116     Chief Complaint  Patient presents with  . Otalgia  . Headache  . Cough    HPI Comments: Patient was evaluated and treated for a URI three days ago, presently taking a Z-pack.  She complains of persistent headache, productive cough, nasal congestion, shortness of breath with activity, earache, and light-headedness upon standing. She has a history of COPD>  The history is provided by the patient.    Past Medical History  Diagnosis Date  . NSTEMI (non-ST elevated myocardial infarction) ([place])     with only mild to moderate CAD in September of 2011,There was concern for apival ballooning. >> ? Tako-Tsubo syndrome vs vasospasm  . HTN (hypertension)   . COPD (chronic obstructive pulmonary disease) ([place])   . Lung cancer [facility])     [name]  Marland Kitchen Asthma   . CAD (coronary artery disease)     a. LHC 9/11 (in setting of NSTEMI): LAD irregs, mLCx 50, pRCA 50, inf-apical AK;  b. Myoview 9/13: Normal stress nuclear study.  LV Ejection Fraction: 71%   . Chronic diastolic CHF (congestive heart failure) ([place])     a. Echo 7/15: Mild LVH, mild focal basal septal hypertrophy, EF 50-55%, no RWMA, Gr 1 DD  . Carotid stenosis     a. Carotid US 9/13: bilat 0-39%;  b. Carotid US 7/15: no sig stenosis   Past Surgical History  Procedure Laterality Date  . Knee surgery    . Vesicovaginal fistula closure w/ tah    . Gallbladder surgery    . Cataract surgery    . Foot surgery    . Lung lobectomy     Family History  Problem Relation Age of Onset  . Heart disease      POSITIVE FAMILY HX. OF   . Pancreatitis      QUESTIONABLE FROM ACE INHIBITORS  . Heart disease Mother 3  . Asthma Mother   . Diabetes Mother   . Cancer Mother    Social History  Substance Use Topics  . Smoking status: Former Research scientist (life sciences)   . Smokeless tobacco: Never Used     Comment: She stopped smoking four years ago.  . Alcohol Use: No   OB History    No data available     Review of Systems + sore throat + hoarse + cough + sneezing No pleuritic pain No wheezing + nasal congestion + post-nasal drainage No sinus pain/pressure No itchy/red eyes + earache + light headed No hemoptysis No SOB No fever/ + chills/sweats No nausea No vomiting No abdominal pain No diarrhea No urinary symptoms No skin rash + fatigue No myalgias + headache Used OTC meds without relief  Allergies  Sulfa antibiotics; Amlodipine; Pantoprazole; and Sulfonamide derivatives  Home Medications   Prior to Admission medications   Medication Sig Start Date End Date Taking? Authorizing Provider  aspirin 81 MG tablet Take 81 mg by mouth daily.      Historical Provider, MD  atorvastatin (LIPITOR) 20 MG tablet Take 1 tablet (20 mg total) by mouth daily. 02/07/15   Liliane Shi, PA-C  azithromycin (ZITHROMAX) 250 MG tablet Take 1 tablet (250 mg total) by mouth daily. Take first 2 tablets together, then 1 every day until finished. 05/23/15   Noland Fordyce,  PA-C  budesonide-formoterol (SYMBICORT) 160-4.5 MCG/ACT inhaler Inhale 2 puffs into the lungs 2 (two) times daily.    Historical Provider, MD  calcium carbonate (OS-CAL) 600 MG TABS tablet Take 600 mg by mouth daily with breakfast.    Historical Provider, MD  Coenzyme Q10 (CO Q-10) 100 MG CAPS Take 1 tablet by mouth daily.    Historical Provider, MD  fluticasone (FLONASE) 50 MCG/ACT nasal spray Place 2 sprays into both nostrils daily. 05/23/15   Noland Fordyce, PA-C  furosemide (LASIX) 40 MG tablet Take 1 tablet (40 mg total) by mouth 2 (two) times daily. 10/29/13   Lelon Perla, MD  isosorbide mononitrate (IMDUR) 30 MG 24 hr tablet Take 1 tablet (30 mg total) by mouth daily. 01/16/15   Liliane Shi, PA-C  losartan (COZAAR) 100 MG tablet Take 1 tablet (100 mg total) by mouth daily. 11/30/11    Lelon Perla, MD  metoprolol tartrate (LOPRESSOR) 25 MG tablet TAKE 1 TABLET BY MOUTH 2 TIMES DAILY 08/10/12   Lelon Perla, MD  nitroGLYCERIN (NITROSTAT) 0.4 MG SL tablet Place 1 tablet (0.4 mg total) under the tongue every 5 (five) minutes as needed for chest pain. 12/18/13   Lelon Perla, MD  OMEGA-3 FATTY ACIDS PO Take by mouth daily.    Historical Provider, MD  pantoprazole (PROTONIX) 40 MG tablet Take 1 tablet (40 mg total) by mouth as directed. 40 MG DAILY X 2 WEEKS THEN ONLY AS NEEDED 01/16/15   Liliane Shi, PA-C  potassium chloride SA (K-DUR,KLOR-CON) 20 MEQ tablet Take 20 mEq by mouth daily.     Historical Provider, MD  predniSONE (DELTASONE) 20 MG tablet Take 1 tablet (20 mg total) by mouth 2 (two) times daily. Take with food. 05/26/15   Kandra Nicolas, MD   Meds Ordered and Administered this Visit   Medications  methylPREDNISolone sodium succinate (SOLU-MEDROL) 125 mg/2 mL injection 80 mg (not administered)  ipratropium-albuterol (DUONEB) 0.5-2.5 (3) MG/3ML nebulizer solution 3 mL (3 mLs Nebulization Given 05/26/15 1153)    BP 145/83 mmHg  Pulse 83  Temp(Src) 98.1 F (36.7 C) (Oral)  Resp 18  SpO2 92% Orthostatic VS for the past 24 hrs:  BP- Lying Pulse- Lying BP- Sitting Pulse- Sitting BP- Standing at 0 minutes Pulse- Standing at 0 minutes  05/26/15 1155 133/81 mmHg 82 132/81 mmHg 89 111/69 mmHg 91    Physical Exam Nursing notes and Vital Signs reviewed. Appearance:  Patient appears stated age, and in no acute distress Eyes:  Pupils are equal, round, and reactive to light and accomodation.  Extraocular movement is intact.  Conjunctivae are not inflamed  Ears:  Canals normal.  Tympanic membranes have decreased light reflex Nose:  Congested turbinates.  No sinus tenderness.   Pharynx:  Normal Neck:  Supple.  Tender enlarged posterior nodes are palpated bilaterally  Lungs:   Bilateral faint wheezes.  Breath sounds are equal.  Moving air well. Heart:  Regular  rate and rhythm without murmurs, rubs, or gallops.  Abdomen:  Nontender without masses or hepatosplenomegaly.  Bowel sounds are present.  No CVA or flank tenderness.  Extremities:  No edema.  Skin:  No rash present.   ED Course  Procedures none    Labs Reviewed  POCT CBC W AUTO DIFF ([facility]):  WBC 9.9; LY 24.7; MO 11.6; GR 63.7; Hgb 14.2; Platelets 259    Tympanogram:  Low peak height both ears   MDM   1. COPD exacerbation ([place])  2. Viral URI with cough; normal WBC reassuring.   3. Eustachian tube disorder, bilateral   4. Orthostatic hypotension    Administered DuoNeb by nebulizer; decreased wheezes post-neb Solumedrol '80mg'$  IM. Begin prednisone burst Tuesday 05/27/15. Take plain guaifenesin ('1200mg'$  extended release tabs such as Mucinex) twice daily, with plenty of water, for cough and congestion. Get adequate rest.   May use Afrin nasal spray (or generic oxymetazoline) twice daily for about 5 days and then discontinue.  Also recommend using saline nasal spray several times daily and saline nasal irrigation (AYR is a common brand).  Use Flonase nasal spray each morning after using Afrin nasal spray and saline nasal irrigation. Try warm salt water gargles for sore throat.  Stop all antihistamines for now, and other non-prescription cough/cold preparations. Continue and finish Azithromycin. Continue nebulizer and inhalers at home as prescribed. Recommend followup by a pulmonologist for long term management of your COPD Followup with Family Doctor for further evaluation    Kandra Nicolas, MD 05/28/15 2035

## 2015-06-01 ENCOUNTER — Telehealth: Payer: Self-pay | Admitting: Emergency Medicine

## 2015-06-13 ENCOUNTER — Encounter: Payer: Self-pay | Admitting: Emergency Medicine

## 2015-06-13 ENCOUNTER — Emergency Department
Admission: EM | Admit: 2015-06-13 | Discharge: 2015-06-13 | Disposition: A | Discharge status: Home / Self Care | Payer: Medicare Other | Source: Home / Self Care | Attending: Family Medicine | Admitting: Family Medicine

## 2015-06-13 DIAGNOSIS — H109 Unspecified conjunctivitis: Secondary | ICD-10-CM

## 2015-06-13 MED ORDER — KETOROLAC TROMETHAMINE 0.5 % OP SOLN: 1.0000 [drp] | Freq: Four times a day (QID) | OPHTHALMIC | Status: DC

## 2015-06-13 NOTE — ED Notes (Signed)
Bi-lateral eyes watering x 1 week, itching and discharge x 2 days, red irritated

## 2015-06-13 NOTE — ED Provider Notes (Signed)
CSN: [id number]     Arrival date & time 06/13/15  1237 History   First MD Initiated Contact with Patient 06/13/15 1336     Chief Complaint  Patient presents with  . Conjunctivitis     HPI Comments: Patient complains of one week history of redness in both eyes with increased lacrimation.  No eyelid pain or foreign body sensation.  She states that both eyes became affected at the same time.  She notes clear mucous from her eyes in the morning.  No sinus congestion.  No changes in vision.  Patient is a 80 y.o. female presenting with conjunctivitis. The history is provided by the patient.  Conjunctivitis This is a new problem. Episode onset: 1 week ago. The problem occurs constantly. The problem has been gradually worsening. Pertinent negatives include no headaches. Nothing aggravates the symptoms. Nothing relieves the symptoms. She has tried nothing for the symptoms.    Past Medical History  Diagnosis Date  . NSTEMI (non-ST elevated myocardial infarction) ([place])     with only mild to moderate CAD in September of 2011,There was concern for apival ballooning. >> ? Tako-Tsubo syndrome vs vasospasm  . HTN (hypertension)   . COPD (chronic obstructive pulmonary disease) ([place])   . Lung cancer [facility])     [name]  Marland Kitchen Asthma   . CAD (coronary artery disease)     a. LHC 9/11 (in setting of NSTEMI): LAD irregs, mLCx 50, pRCA 50, inf-apical AK;  b. Myoview 9/13: Normal stress nuclear study.  LV Ejection Fraction: 71%   . Chronic diastolic CHF (congestive heart failure) ([place])     a. Echo 7/15: Mild LVH, mild focal basal septal hypertrophy, EF 50-55%, no RWMA, Gr 1 DD  . Carotid stenosis     a. Carotid US 9/13: bilat 0-39%;  b. Carotid US 7/15: no sig stenosis   Past Surgical History  Procedure Laterality Date  . Knee surgery    . Vesicovaginal fistula closure w/ tah    . Gallbladder surgery    . Cataract surgery    .  Foot surgery    . Lung lobectomy     Family History  Problem Relation Age of Onset  . Heart disease      POSITIVE FAMILY HX. OF   . Pancreatitis      QUESTIONABLE FROM ACE INHIBITORS  . Heart disease Mother 57  . Asthma Mother   . Diabetes Mother   . Cancer Mother    Social History  Substance Use Topics  . Smoking status: Former Research scientist (life sciences)  . Smokeless tobacco: Never Used     Comment: She stopped smoking four years ago.  . Alcohol Use: No   OB History    No data available     Review of Systems  HENT: Negative for congestion, facial swelling, rhinorrhea, sinus pressure, sneezing and sore throat.   Eyes: Positive for discharge, redness and itching. Negative for photophobia, pain and visual disturbance.  Neurological: Negative for headaches.    Allergies  Sulfa antibiotics; Amlodipine; Pantoprazole; and Sulfonamide derivatives  Home Medications   Prior to Admission medications   Medication Sig Start Date End Date Taking? Authorizing Provider  aspirin 81 MG tablet Take 81 mg by mouth daily.      Historical Provider, MD  atorvastatin (LIPITOR) 20 MG tablet Take 1 tablet (20 mg total) by mouth daily. 02/07/15   Liliane Shi, PA-C  budesonide-formoterol (SYMBICORT) 160-4.5 MCG/ACT inhaler Inhale 2 puffs into the lungs 2 (two) times daily.    Historical Provider, MD  calcium carbonate (OS-CAL) 600 MG TABS tablet Take 600 mg by mouth daily with breakfast.    Historical Provider, MD  Coenzyme Q10 (CO Q-10) 100 MG CAPS Take 1 tablet by mouth daily.    Historical Provider, MD  fluticasone (FLONASE) 50 MCG/ACT nasal spray Place 2 sprays into both nostrils daily. 05/23/15   Noland Fordyce, PA-C  furosemide (LASIX) 40 MG tablet Take 1 tablet (40 mg total) by mouth 2 (two) times daily. 10/29/13   Lelon Perla, MD  isosorbide mononitrate (IMDUR) 30 MG 24 hr tablet Take 1 tablet (30 mg total) by mouth daily. 01/16/15   Liliane Shi, PA-C  ketorolac (ACULAR) 0.5 % ophthalmic solution Place  1 drop into both eyes 4 (four) times daily. 06/13/15   Kandra Nicolas, MD  losartan (COZAAR) 100 MG tablet Take 1 tablet (100 mg total) by mouth daily. 11/30/11   Lelon Perla, MD  metoprolol tartrate (LOPRESSOR) 25 MG tablet TAKE 1 TABLET BY MOUTH 2 TIMES DAILY 08/10/12   Lelon Perla, MD  nitroGLYCERIN (NITROSTAT) 0.4 MG SL tablet Place 1 tablet (0.4 mg total) under the tongue every 5 (five) minutes as needed for chest pain. 12/18/13   Lelon Perla, MD  OMEGA-3 FATTY ACIDS PO Take by mouth daily.    Historical Provider, MD  pantoprazole (PROTONIX) 40 MG tablet Take 1 tablet (40 mg total) by mouth as directed. 40 MG DAILY X 2 WEEKS THEN ONLY AS NEEDED 01/16/15   Liliane Shi, PA-C  potassium chloride SA (K-DUR,KLOR-CON) 20 MEQ tablet Take 20 mEq by mouth daily.     Historical Provider, MD   Meds Ordered and Administered this Visit  Medications - No data to display  BP 132/82 mmHg  Pulse 93  Temp(Src) 97.8 F (36.6 C) (Oral)  Ht 5' (1.524 m)  Wt 209 lb (94.802 kg)  BMI 40.82 kg/m2  SpO2 93% No data found.   Physical Exam  Constitutional: She appears well-developed and well-nourished.  HENT:  Head: Normocephalic.  Nose: Nose normal.  Mouth/Throat: Oropharynx is clear and moist.  Eyes: EOM are normal. Pupils are equal, round, and reactive to light. Right eye exhibits chemosis and discharge. Right eye exhibits no hordeolum. No foreign body present in the right eye. Left eye exhibits chemosis and discharge. Left eye exhibits no hordeolum. No foreign body present in the left eye. Right conjunctiva is injected. Left conjunctiva is injected.    Medial and lateral cunjunctivae are mildly injected bilaterally with mild chemosis.  Neck: Neck supple.  Lymphadenopathy:    She has no cervical adenopathy.  Nursing note and vitals reviewed.   ED Course  Procedures  None    MDM   1. Bilateral conjunctivitis; suspect allergy mediated   Begin Acular ophthalmic  suspension Followup with ophthalmologist one week if not improving.    Kandra Nicolas, MD 06/13/15 2003

## 2015-06-13 NOTE — Discharge Instructions (Signed)
How to Use Eye Drops and Eye Ointments HOW TO APPLY EYE DROPS Follow these steps when applying eye drops: 1. Wash your hands. 2. Tilt your head back. 3. Put a finger under your eye and use it to gently pull your lower lid downward. Keep that finger in place. 4. Using your other hand, hold the dropper between your thumb and index finger. 5. Position the dropper just over the edge of the lower lid. Hold it as close to your eye as you can without touching the dropper to your eye. 6. Steady your hand. One way to do this is to lean your index finger against your brow. 7. Look up. 8. Slowly and gently squeeze one drop of medicine into your eye. 9. Close your eye. 10. Place a finger between your lower eyelid and your nose. Press gently for 2 minutes. This increases the amount of time that the medicine is exposed to the eye. It also reduces side effects that can develop if the drop gets into the bloodstream through the nose. HOW TO APPLY EYE OINTMENTS Follow these steps when applying eye ointments: 1. Wash your hands. 2. Put a finger under your eye and use it to gently pull your lower lid downward. Keep that finger in place. 3. Using your other hand, place the tip of the tube between your thumb and index finger with the remaining fingers braced against your cheek or nose. 4. Hold the tube just over the edge of your lower lid without touching the tube to your lid or eyeball. 5. Look up. 6. Line the inner part of your lower lid with ointment. 7. Gently pull up on your upper lid and look down. This will force the ointment to spread over the surface of the eye. 8. Release the upper lid. 9. If you can, close your eyes for 1-2 minutes. Do not rub your eyes. If you applied the ointment correctly, your vision will be blurry for a few minutes. This is normal. ADDITIONAL INFORMATION  Make sure to use the eye drops or ointment as told by your health care provider.  If you have been told to use both eye  drops and an eye ointment, apply the eye drops first, then wait 3-4 minutes before you apply the ointment.  Try not to touch the tip of the dropper or tube to your eye. A dropper or tube that has touched the eye can become contaminated.   This information is not intended to replace advice given to you by your health care provider. Make sure you discuss any questions you have with your health care provider.   Document Released: 07/19/2000 Document Revised: 08/27/2014 Document Reviewed: 04/08/2014 Elsevier Interactive Patient Education [employer].

## 2015-06-20 ENCOUNTER — Ambulatory Visit (INDEPENDENT_AMBULATORY_CARE_PROVIDER_SITE_OTHER): Payer: Medicare Other | Admitting: Cardiology

## 2015-06-20 ENCOUNTER — Encounter: Payer: Self-pay | Admitting: Cardiology

## 2015-06-20 VITALS — BP 136/76 | HR 76 | Ht 61.0 in | Wt 212.0 lb

## 2015-06-20 DIAGNOSIS — R0600 Dyspnea, unspecified: Secondary | ICD-10-CM

## 2015-06-20 DIAGNOSIS — I10 Essential (primary) hypertension: Secondary | ICD-10-CM

## 2015-06-20 DIAGNOSIS — I2510 Atherosclerotic heart disease of native coronary artery without angina pectoris: Secondary | ICD-10-CM | POA: Diagnosis not present

## 2015-06-20 NOTE — Assessment & Plan Note (Signed)
Blood pressure controlled. Continue present medications. 

## 2015-06-20 NOTE — Progress Notes (Signed)
HPI: FU CAD, diastolic HF, HTN, HL. Since last seen, Patient describes increased dyspnea on exertion and pedal edema. She had chest pain one month ago. She denies syncope.   Studies/Reports Reviewed Today:  Echo 7/15 Mild LVH, mild focal basal septal hypertrophy, EF 50-55%, no RWMA, Gr 1 DD  Carotid US 7/15 No significant stenosis  Myoview 9/13 Normal stress nuclear study. LV Ejection Fraction: 71%. LV Wall Motion: NL LV Function; NL Wall Motion  LHC 9/11 LM: OK LAD: irregs LCx: Mid 50% RCA: prox 50% LV - inf-apical AK  Current Outpatient Prescriptions  Medication Sig Dispense Refill  . aspirin 81 MG tablet Take 81 mg by mouth daily.      Marland Kitchen atorvastatin (LIPITOR) 20 MG tablet Take 20 mg by mouth daily.  3  . budesonide-formoterol (SYMBICORT) 160-4.5 MCG/ACT inhaler Inhale 2 puffs into the lungs 2 (two) times daily.    . Cholecalciferol (VITAMIN D3) 1000 units CAPS Take 1,000 Units by mouth daily.    . Coenzyme Q10 (CO Q-10) 100 MG CAPS Take 1 tablet by mouth daily.    . fluticasone (FLONASE) 50 MCG/ACT nasal spray Place 2 sprays into both nostrils daily. 9.9 g 2  . furosemide (LASIX) 40 MG tablet Take 1 tablet (40 mg total) by mouth 2 (two) times daily. 60 tablet 12  . isosorbide mononitrate (IMDUR) 30 MG 24 hr tablet Take 1 tablet (30 mg total) by mouth daily. 30 tablet 11  . ketorolac (ACULAR) 0.5 % ophthalmic solution Place 1 drop into both eyes 4 (four) times daily. 5 mL 0  . losartan (COZAAR) 100 MG tablet Take 1 tablet (100 mg total) by mouth daily. 30 tablet 12  . metoprolol tartrate (LOPRESSOR) 25 MG tablet TAKE 1 TABLET BY MOUTH 2 TIMES DAILY 60 tablet 2  . nitroGLYCERIN (NITROSTAT) 0.4 MG SL tablet Place 1 tablet (0.4 mg total) under the tongue every 5 (five) minutes as needed for chest pain. 25 tablet 12  . OMEGA-3 FATTY ACIDS PO Take by mouth daily.    . pantoprazole (PROTONIX) 40 MG tablet Take 1 tablet (40 mg total) by mouth as directed. 40 MG DAILY X  2 WEEKS THEN ONLY AS NEEDED 30 tablet 3  . potassium chloride SA (K-DUR,KLOR-CON) 20 MEQ tablet Take 20 mEq by mouth daily.      No current facility-administered medications for this visit.     Past Medical History  Diagnosis Date  . NSTEMI (non-ST elevated myocardial infarction) ([place])     with only mild to moderate CAD in September of 2011,There was concern for apival ballooning. >> ? Tako-Tsubo syndrome vs vasospasm  . HTN (hypertension)   . COPD (chronic obstructive pulmonary disease) ([place])   . Lung cancer [facility])     [name]  Marland Kitchen Asthma   . CAD (coronary artery disease)     a. LHC 9/11 (in setting of NSTEMI): LAD irregs, mLCx 50, pRCA 50, inf-apical AK;  b. Myoview 9/13: Normal stress nuclear study.  LV Ejection Fraction: 71%   . Chronic diastolic CHF (congestive heart failure) ([place])     a. Echo 7/15: Mild LVH, mild focal basal septal hypertrophy, EF 50-55%, no RWMA, Gr 1 DD  . Carotid stenosis     a. Carotid US 9/13: bilat 0-39%;  b. Carotid US 7/15: no sig stenosis    Past Surgical History  Procedure Laterality Date  .  Knee surgery    . Vesicovaginal fistula closure w/ tah    . Gallbladder surgery    . Cataract surgery    . Foot surgery    . Lung lobectomy      Social History   Social History  . Marital Status: Divorced    Spouse Name: N/A  . Number of Children: N/A  . Years of Education: N/A   Occupational History  . Not on file.   Social History Main Topics  . Smoking status: Former Research scientist (life sciences)  . Smokeless tobacco: Never Used     Comment: She stopped smoking four years ago.  . Alcohol Use: No  . Drug Use: No  . Sexual Activity: Not on file   Other Topics Concern  . Not on file   Social History Narrative    Family History  Problem Relation Age of Onset  . Heart disease      POSITIVE FAMILY HX. OF   . Pancreatitis      QUESTIONABLE FROM ACE INHIBITORS  . Heart disease  Mother 27  . Asthma Mother   . Diabetes Mother   . Cancer Mother     ROS: no fevers or chills, productive cough, hemoptysis, dysphasia, odynophagia, melena, hematochezia, dysuria, hematuria, rash, seizure activity, orthopnea, PND, claudication. Remaining systems are negative.  Physical Exam: Well-developed obese in no acute distress.  Skin is warm and dry.  HEENT is normal.  Neck is supple.  Chest is clear to auscultation with normal expansion.  Cardiovascular exam is regular rate and rhythm.  Abdominal exam nontender or distended. No masses palpated. Extremities show trace edema. neuro grossly intact  ECG Normal sinus rhythm at a rate of 76. Anterior infarct.

## 2015-06-20 NOTE — Assessment & Plan Note (Signed)
Patient appears to be relatively euvolemic. Continue Lasix. She is describing dyspnea. This is likely multifactorial. Check echocardiogram for LV systolic and diastolic function. Continue Lasix. Check potassium, renal function and BNP. If BNP elevated will increase Lasix.

## 2015-06-20 NOTE — Patient Instructions (Signed)
Medication Instructions:   NO CHANGE  Labwork:  Your physician recommends that you HAVE LAB WORK TODAY  Testing/Procedures:  Your physician has requested that you have a lexiscan myoview. For further information please visit [email or web address]. Please follow instruction sheet, as given.   Your physician has requested that you have an echocardiogram. Echocardiography is a painless test that uses sound waves to create images of your heart. It provides your doctor with information about the size and shape of your heart and how well your heart's chambers and valves are working. This procedure takes approximately one hour. There are no restrictions for this procedure.    Follow-Up:  Your physician recommends that you schedule a follow-up appointment in: [place]

## 2015-06-20 NOTE — Assessment & Plan Note (Signed)
Continue statin. 

## 2015-06-20 NOTE — Assessment & Plan Note (Signed)
Continue aspirin and statin. 

## 2015-06-21 LAB — BASIC METABOLIC PANEL
BUN: 14 mg/dL (ref 7–25)
CALCIUM: 9.4 mg/dL (ref 8.6–10.4)
CHLORIDE: 99 mmol/L (ref 98–110)
CO2: 33 mmol/L — ABNORMAL HIGH (ref 20–31)
CREATININE: 0.71 mg/dL (ref 0.60–0.93)
Glucose, Bld: 88 mg/dL (ref 65–99)
Potassium: 4.6 mmol/L (ref 3.5–5.3)
SODIUM: 143 mmol/L (ref 135–146)

## 2015-06-21 LAB — BRAIN NATRIURETIC PEPTIDE: Brain Natriuretic Peptide: 62 pg/mL (ref <100)

## 2015-07-01 ENCOUNTER — Telehealth (HOSPITAL_COMMUNITY): Payer: Self-pay

## 2015-07-01 NOTE — Telephone Encounter (Signed)
Encounter complete. 

## 2015-07-03 ENCOUNTER — Ambulatory Visit (HOSPITAL_COMMUNITY)
Admission: RE | Admit: 2015-07-03 | Discharge: 2015-07-03 | Disposition: A | Discharge status: Home / Self Care | Payer: Medicare Other | Source: Ambulatory Visit | Attending: Cardiology | Admitting: Cardiology

## 2015-07-03 DIAGNOSIS — R0600 Dyspnea, unspecified: Secondary | ICD-10-CM

## 2015-07-03 DIAGNOSIS — E669 Obesity, unspecified: Secondary | ICD-10-CM | POA: Diagnosis not present

## 2015-07-03 DIAGNOSIS — Z8249 Family history of ischemic heart disease and other diseases of the circulatory system: Secondary | ICD-10-CM | POA: Insufficient documentation

## 2015-07-03 DIAGNOSIS — R079 Chest pain, unspecified: Secondary | ICD-10-CM | POA: Insufficient documentation

## 2015-07-03 DIAGNOSIS — I10 Essential (primary) hypertension: Secondary | ICD-10-CM | POA: Diagnosis not present

## 2015-07-03 DIAGNOSIS — R5383 Other fatigue: Secondary | ICD-10-CM | POA: Insufficient documentation

## 2015-07-03 DIAGNOSIS — Z6841 Body Mass Index (BMI) 40.0 and over, adult: Secondary | ICD-10-CM | POA: Diagnosis not present

## 2015-07-03 DIAGNOSIS — I2510 Atherosclerotic heart disease of native coronary artery without angina pectoris: Secondary | ICD-10-CM

## 2015-07-03 DIAGNOSIS — I779 Disorder of arteries and arterioles, unspecified: Secondary | ICD-10-CM | POA: Insufficient documentation

## 2015-07-03 DIAGNOSIS — Z87891 Personal history of nicotine dependence: Secondary | ICD-10-CM | POA: Insufficient documentation

## 2015-07-03 LAB — MYOCARDIAL PERFUSION IMAGING
LV dias vol: 57 mL (ref 46–106)
LV sys vol: 19 mL
Peak HR: 94 {beats}/min
Rest HR: 76 {beats}/min
SDS: 3
SRS: 1
SSS: 4
TID: 1.05

## 2015-07-03 MED ORDER — TECHNETIUM TC 99M SESTAMIBI GENERIC - CARDIOLITE
10.6000 | Freq: Once | INTRAVENOUS | Status: AC | PRN
  Administered 2015-07-03: 10.6 via INTRAVENOUS

## 2015-07-03 MED ORDER — REGADENOSON 0.4 MG/5ML IV SOLN
0.4000 mg | Freq: Once | INTRAVENOUS | Status: AC
  Administered 2015-07-03: 0.4 mg via INTRAVENOUS

## 2015-07-03 MED ORDER — TECHNETIUM TC 99M SESTAMIBI GENERIC - CARDIOLITE
31.6000 | Freq: Once | INTRAVENOUS | Status: AC | PRN
  Administered 2015-07-03: 31.6 via INTRAVENOUS

## 2015-07-03 MED ORDER — AMINOPHYLLINE 25 MG/ML IV SOLN
75.0000 mg | Freq: Once | INTRAVENOUS | Status: AC
  Administered 2015-07-03: 75 mg via INTRAVENOUS

## 2015-07-04 ENCOUNTER — Other Ambulatory Visit: Payer: Self-pay

## 2015-07-04 ENCOUNTER — Ambulatory Visit (HOSPITAL_COMMUNITY): Payer: Medicare Other | Attending: Cardiology

## 2015-07-04 DIAGNOSIS — R0600 Dyspnea, unspecified: Secondary | ICD-10-CM

## 2015-07-04 DIAGNOSIS — I10 Essential (primary) hypertension: Secondary | ICD-10-CM | POA: Diagnosis not present

## 2015-07-04 DIAGNOSIS — Z87891 Personal history of nicotine dependence: Secondary | ICD-10-CM | POA: Diagnosis not present

## 2015-07-04 DIAGNOSIS — C3490 Malignant neoplasm of unspecified part of unspecified bronchus or lung: Secondary | ICD-10-CM | POA: Insufficient documentation

## 2015-07-04 DIAGNOSIS — E785 Hyperlipidemia, unspecified: Secondary | ICD-10-CM | POA: Diagnosis not present

## 2015-07-04 DIAGNOSIS — I2510 Atherosclerotic heart disease of native coronary artery without angina pectoris: Secondary | ICD-10-CM

## 2015-07-04 LAB — ECHOCARDIOGRAM COMPLETE
AOASC: 30 mm
CHL CUP AORTIC ROOT 2D: 32 mm
CHL CUP DOP CALC LVOT VTI: 17.9 cm
CHL CUP ESTIMATED CVP: 3 mmHg
CHL CUP MV DEC (S): 183 ms
E/e' ratio: 13.14
EWDT: 183 ms
FS: 30 % (ref 28–44)
IVS/LV PW RATIO, ED: 1.15
LA ID, A-P, ES: 28 mm
LA SIZE INDEX: 1.34 mm/m2
LA VOL 2D INDEX: 14.9 mL/m2
LA VOL 2D: 31 mL
LA vol: 38 mL
LAVOLIN: 18.2 mL/m2
LDCA: 3.14 cm2
LEFT ATRIUM END SYS DIAM: 28 mm
LV PW d: 9.44 mm — AB (ref 0.6–1.1)
LV TDI E'LATERAL: 6.69 cm/s
LV TDI E'MEDIAL: 5.92 cm/s
LVDIAVOL: 42 mL — AB (ref 46–106)
LVDIAVOLIN: 20 mL/m2
LVIDD: 47.9 mm — AB (ref 3.5–6.0)
LVIDS: 33.3 mm — AB (ref 2.1–4.0)
LVOT MEAN VEL: 50.3 cm/s
LVOT SV INDEX: 27 mL/m2
LVOTD: 20 mm
LVOTPV: 80.5 cm/s
LVOTSV: 56 mL
LVSYSVOL: 42 mL (ref 14–42)
LVSYSVOLIN: 10 mL/m2
MV Peak grad: 3 mmHg
MV pk A vel: 80 cm/s
MV pk E vel: 87.9 cm/s
PWSYS: 9.44 mm
SV INDEX: 10.5 mL/m2
Simpson's disk: 52
Single Plane A4C EF: 52 %
Stroke v: 22 ml

## 2015-08-04 DIAGNOSIS — R918 Other nonspecific abnormal finding of lung field: Secondary | ICD-10-CM | POA: Insufficient documentation

## 2015-08-17 NOTE — Progress Notes (Signed)
HPI: FU CAD, diastolic HF, HTN, HL. At last office visit patient complaining of dyspnea.BNP 64. Since last seen, She continues to have dyspnea on exertion. There is orthopnea but no PND.Mild pedal edema. No chest pain or syncope.   Studies/Reports Reviewed Today:  Echo 6/75 Normal systolic function, grade 1 diastolic dysfunction.  Carotid US 7/15 No significant stenosis  Myoview 3/17 Ejection fraction 66% and no ischemia or infarction.  LHC 9/11 LM: OK LAD: irregs LCx: Mid 50% RCA: prox 50% LV - inf-apical AK  Current Outpatient Prescriptions  Medication Sig Dispense Refill  . aspirin 81 MG tablet Take 81 mg by mouth daily.      Marland Kitchen atorvastatin (LIPITOR) 20 MG tablet Take 20 mg by mouth daily.  3  . budesonide-formoterol (SYMBICORT) 160-4.5 MCG/ACT inhaler Inhale 2 puffs into the lungs 2 (two) times daily.    . Cholecalciferol (VITAMIN D3) 1000 units CAPS Take 1,000 Units by mouth daily.    . Coenzyme Q10 (CO Q-10) 100 MG CAPS Take 1 tablet by mouth daily.    . fluticasone (FLONASE) 50 MCG/ACT nasal spray Place 2 sprays into both nostrils daily. 9.9 g 2  . furosemide (LASIX) 40 MG tablet Take 1 tablet (40 mg total) by mouth 2 (two) times daily. 60 tablet 12  . isosorbide mononitrate (IMDUR) 30 MG 24 hr tablet Take 1 tablet (30 mg total) by mouth daily. 30 tablet 11  . ketorolac (ACULAR) 0.5 % ophthalmic solution Place 1 drop into both eyes 4 (four) times daily. 5 mL 0  . losartan (COZAAR) 100 MG tablet Take 1 tablet (100 mg total) by mouth daily. 30 tablet 12  . metoprolol tartrate (LOPRESSOR) 25 MG tablet TAKE 1 TABLET BY MOUTH 2 TIMES DAILY 60 tablet 2  . nitroGLYCERIN (NITROSTAT) 0.4 MG SL tablet Place 1 tablet (0.4 mg total) under the tongue every 5 (five) minutes as needed for chest pain. 25 tablet 12  . OMEGA-3 FATTY ACIDS PO Take by mouth daily.    . pantoprazole (PROTONIX) 40 MG tablet Take 1 tablet (40 mg total) by mouth as directed. 40 MG DAILY X 2 WEEKS THEN  ONLY AS NEEDED 30 tablet 3  . potassium chloride SA (K-DUR,KLOR-CON) 20 MEQ tablet Take 20 mEq by mouth daily.      No current facility-administered medications for this visit.     Past Medical History  Diagnosis Date  . NSTEMI (non-ST elevated myocardial infarction) ([place])     with only mild to moderate CAD in September of 2011,There was concern for apival ballooning. >> ? Tako-Tsubo syndrome vs vasospasm  . HTN (hypertension)   . COPD (chronic obstructive pulmonary disease) ([place])   . Lung cancer [facility])     [name]  Marland Kitchen Asthma   . CAD (coronary artery disease)     a. LHC 9/11 (in setting of NSTEMI): LAD irregs, mLCx 50, pRCA 50, inf-apical AK;  b. Myoview 9/13: Normal stress nuclear study.  LV Ejection Fraction: 71%   . Chronic diastolic CHF (congestive heart failure) ([place])     a. Echo 7/15: Mild LVH, mild focal basal septal hypertrophy, EF 50-55%, no RWMA, Gr 1 DD  . Carotid stenosis     a. Carotid US 9/13: bilat 0-39%;  b. Carotid US 7/15: no sig stenosis    Past Surgical History  Procedure Laterality Date  . Knee surgery    .  Vesicovaginal fistula closure w/ tah    . Gallbladder surgery    . Cataract surgery    . Foot surgery    . Lung lobectomy      Social History   Social History  . Marital Status: Divorced    Spouse Name: N/A  . Number of Children: N/A  . Years of Education: N/A   Occupational History  . Not on file.   Social History Main Topics  . Smoking status: Former Research scientist (life sciences)  . Smokeless tobacco: Never Used     Comment: She stopped smoking four years ago.  . Alcohol Use: No  . Drug Use: No  . Sexual Activity: Not on file   Other Topics Concern  . Not on file   Social History Narrative    Family History  Problem Relation Age of Onset  . Heart disease      POSITIVE FAMILY HX. OF   . Pancreatitis      QUESTIONABLE FROM ACE INHIBITORS  . Heart disease Mother 56  .  Asthma Mother   . Diabetes Mother   . Cancer Mother     ROS: no fevers or chills, productive cough, hemoptysis, dysphasia, odynophagia, melena, hematochezia, dysuria, hematuria, rash, seizure activity, orthopnea, PND, claudication. Remaining systems are negative.  Physical Exam: Well-developed obese well-nourished in no acute distress.  Skin is warm and dry.  HEENT is normal.  Neck is supple.  Chest diminished BS throughout and expiratory wheeze Cardiovascular exam is regular rate and rhythm.  Abdominal exam nontender or distended. No masses palpated. Extremities show trace edema. neuro grossly intact

## 2015-08-20 ENCOUNTER — Telehealth: Payer: Self-pay | Admitting: Pulmonary Disease

## 2015-08-20 NOTE — Telephone Encounter (Signed)
Called spoke with Deanna at Dr. Tobey Grim office. She states the patient has an ov with JN on 08/29/15. She states that Dr. Jannifer Franklin wants to make JN aware that last week he tried to do a biopsy on the pt's thyroid nodule. He states that the results came back un diagnostic due to the fact that the patient was in pain during the biopsy and refused to finish the biopsy. Since the biopsy Dr. Tobey Grim office has contacted the pt and the patient has refused to see Dr. Jannifer Franklin or his practice because she felt that the procedure was uncomfortable. Deanna states that Dr. Jannifer Franklin wanted to make JN aware that as of right now there is no doctor is following her at this time for the thyroid nodule.  I explained to her that I would send the message to JN to make him aware of the situation. Deanna did state that the results of the biopsy has been faxed to our office for her consult visit on 08/29/15. She voiced understanding and had no further questions.   FYI to [facility]

## 2015-08-21 ENCOUNTER — Ambulatory Visit (INDEPENDENT_AMBULATORY_CARE_PROVIDER_SITE_OTHER): Payer: Medicare Other | Admitting: Cardiology

## 2015-08-21 ENCOUNTER — Encounter: Payer: Self-pay | Admitting: Cardiology

## 2015-08-21 VITALS — BP 110/80 | HR 88 | Ht 61.0 in | Wt 213.0 lb

## 2015-08-21 DIAGNOSIS — R0600 Dyspnea, unspecified: Secondary | ICD-10-CM | POA: Diagnosis not present

## 2015-08-21 DIAGNOSIS — E785 Hyperlipidemia, unspecified: Secondary | ICD-10-CM

## 2015-08-21 DIAGNOSIS — I5031 Acute diastolic (congestive) heart failure: Secondary | ICD-10-CM

## 2015-08-21 NOTE — Assessment & Plan Note (Signed)
Continue statin. 

## 2015-08-21 NOTE — Assessment & Plan Note (Signed)
Patient appears to be euvolemic on examination. Continue present dose of Lasix.

## 2015-08-21 NOTE — Assessment & Plan Note (Signed)
Blood pressure controlled. Continue present medications. 

## 2015-08-21 NOTE — Patient Instructions (Signed)
Your physician wants you to follow-up in: 6 MONTHS WITH DR CRENSHAW You will receive a reminder letter in the mail two months in advance. If you don't receive a letter, please call our office to schedule the follow-up appointment.   If you need a refill on your cardiac medications before your next appointment, please call your pharmacy.  

## 2015-08-21 NOTE — Assessment & Plan Note (Signed)
This is likely multifactorial. I think it is predominantly related to lung disease with COPD and previous lung resection from lung cancer. There is also a component of obesity and deconditioning and possible component from diastolic congestive heart failure. Continue present dose of Lasix. She is scheduled to see pulmonary.

## 2015-08-21 NOTE — Assessment & Plan Note (Signed)
Continue aspirin and statin. 

## 2015-08-29 ENCOUNTER — Telehealth: Payer: Self-pay | Admitting: Pulmonary Disease

## 2015-08-29 ENCOUNTER — Ambulatory Visit (INDEPENDENT_AMBULATORY_CARE_PROVIDER_SITE_OTHER): Payer: Medicare Other | Admitting: Pulmonary Disease

## 2015-08-29 ENCOUNTER — Encounter: Payer: Self-pay | Admitting: Pulmonary Disease

## 2015-08-29 ENCOUNTER — Other Ambulatory Visit: Payer: Medicare Other

## 2015-08-29 VITALS — BP 132/78 | HR 96 | Ht 61.0 in | Wt 215.0 lb

## 2015-08-29 DIAGNOSIS — R1310 Dysphagia, unspecified: Secondary | ICD-10-CM | POA: Insufficient documentation

## 2015-08-29 DIAGNOSIS — J449 Chronic obstructive pulmonary disease, unspecified: Secondary | ICD-10-CM | POA: Insufficient documentation

## 2015-08-29 DIAGNOSIS — K219 Gastro-esophageal reflux disease without esophagitis: Secondary | ICD-10-CM | POA: Diagnosis not present

## 2015-08-29 DIAGNOSIS — R918 Other nonspecific abnormal finding of lung field: Secondary | ICD-10-CM

## 2015-08-29 MED ORDER — AEROCHAMBER MV MISC: Status: AC

## 2015-08-29 MED ORDER — RANITIDINE HCL 150 MG PO TABS: 150.0000 mg | ORAL_TABLET | Freq: Every day | ORAL | Status: DC

## 2015-08-29 MED ORDER — MONTELUKAST SODIUM 10 MG PO TABS: 10.0000 mg | ORAL_TABLET | Freq: Every day | ORAL | Status: DC

## 2015-08-29 NOTE — Telephone Encounter (Signed)
IMAGING CXR PA/LAT 01/01/15 (personally reviewed by me): Blunting of right posterior cardiac angle. No pleural effusion appreciated. Fullness in the left hilum that appears chronic and is likely residual scar tissue from prior surgery. No new nodule or opacity appreciated. Heart normal in size. Mediastinum otherwise normal in contour.   CT CHEST W/ 12/15/11 (personally reviewed by me): 6 mm nodule in right lower lobe that is noncalcified and rounded per radiologist present since 01/08/09 & unchanged. No pathologic mediastinal or hilar adenopathy. Soft tissue within the anterior mediastinum corresponding to fullness in the left hilum. Subcentimeter nodular density in left upper lobe reportedly unchanged. Hepatic steatosis noted.   CARDIAC TTE (07/04/15): LV normal in size with EF 55%. No regional wall motion abnormalities. Grade 1 diastolic dysfunction. LA & RA normal in size. RV normal in size and function. No aortic stenosis or regurgitation. No mitral stenosis. No pulmonic regurgitation. No significant tricuspid regurgitation. No pericardial effusion.  NUCLEAR STRESS TEST (07/03/15): Normal study with low risk. No T-wave inversion or ST segment deviation. EF 66% with hyperdynamic LV.  PATHOLOGY L Parotid Gland FNA (11/18/14): Benign fibrofatty soft tissue. No features of salivary gland present. LUL Wedge Resection (02/10/09): Invasive adenocarcinoma 2.0 cm LUL Additional Parenchymal Margin (02/10/09): No tumor identified 4L Lymph Node (02/10/09): No tumor identified 10L Lymph Node (02/10/09): No tumor identified  LABS 06/20/15 BMP: 143/4.7/99/33/14/0.71/88/9.4 BNP: 62  01/16/15 CBC: 16.7/15.0/45.6/161 ProBNP: 33  02/06/09 ABG on RA:  7.395 / 42.7 / 125 / 98.6%

## 2015-08-29 NOTE — Patient Instructions (Addendum)
   Continue using your Symbicort with your spacer  Avoid eating within 3 hours of bedtime and remember to take your Zantac to help with your heartburn  We will review all of your tests at your next appointment  We will repeat a CT of your chest in July to make sure the nodules are not getting bigger  Your sputum culture should be a deep specimen   The Singulair is an allergy pill to help with your sinuses. You can hold off on your Flonase nasal spray for now.  I will see you back in 4-6 weeks but please call me if you have any new problems or questions before then  TESTS ORDERED: 1. CT CHEST W/O July 2017 2. Full PFT on or before next appointment 3. 6MWT on room air on or before next appointment 4. Maxillofacial CT Scan w/o 5. Sputum Ctx AFB, Fungus, & Bacteria 6. Esophagram 7. Serum IgE & RAST Panel

## 2015-08-29 NOTE — Progress Notes (Signed)
Subjective:    Patient ID: Brandy Cameron, female    DOB: 1935-08-29, 80 y.o.   MRN: [id number]  HPI She was being evaluated for thyroid nodules and was found to have a nodule in her lung that prompted a chest CT which showed multiple new nodules. She reports intermittent coughing productive of a varying amount of mucus. She reports it is white, yellow, & green at times. No hemoptysis. Denies any fever or chills. Reports chronic sweats. She does have wheezing. She reports significant dyspnea on exertion that is worsening. She reports significant sinus congestion & post-nasal drainage. She reports she does have significant eructation. She does have dysphagia & odynophagia at the level of the mid-sternum. She does have reflux of food & acid with eructation. She has been on Symbicort for years. She doesn't use a spacer. She used Advair in the past without any symptom relief. Has also previously used different inhalers. She does have a nebulizer that seems to help. She uses her nebulizer at most 4 times a month but her sister reports she should be using them more with her symptoms. She reports remotely she was diagnosed with COPD but hasn't had PFTs that she can recall. She reports she gets bronchitis 3-4 times yearly that are treated with Prednisone & an antibiotic. She isn't taking Protonix any more.   Review of Systems No dysuria or hematuria. She reports intermittent chest pain & pressure relieved with sublingual nitroglycerin. A pertinent 14 point review of systems is negative except as per the history of presenting illness.  Allergies  Allergen Reactions  . Sulfa Antibiotics Rash and Shortness Of Breath  . Amlodipine   . Pantoprazole Nausea And Vomiting  . Sulfonamide Derivatives Rash    Current Outpatient Prescriptions on File Prior to Visit  Medication Sig Dispense Refill  . aspirin 81 MG tablet Take 81 mg by mouth daily.      Marland Kitchen atorvastatin (LIPITOR) 20 MG tablet Take 20 mg by mouth  daily.  3  . budesonide-formoterol (SYMBICORT) 160-4.5 MCG/ACT inhaler Inhale 2 puffs into the lungs 2 (two) times daily.    . Cholecalciferol (VITAMIN D3) 1000 units CAPS Take 1,000 Units by mouth daily.    . Coenzyme Q10 (CO Q-10) 100 MG CAPS Take 1 tablet by mouth daily.    . fluticasone (FLONASE) 50 MCG/ACT nasal spray Place 2 sprays into both nostrils daily. 9.9 g 2  . furosemide (LASIX) 40 MG tablet Take 1 tablet (40 mg total) by mouth 2 (two) times daily. 60 tablet 12  . isosorbide mononitrate (IMDUR) 30 MG 24 hr tablet Take 1 tablet (30 mg total) by mouth daily. 30 tablet 11  . ketorolac (ACULAR) 0.5 % ophthalmic solution Place 1 drop into both eyes 4 (four) times daily. 5 mL 0  . losartan (COZAAR) 100 MG tablet Take 1 tablet (100 mg total) by mouth daily. 30 tablet 12  . metoprolol tartrate (LOPRESSOR) 25 MG tablet TAKE 1 TABLET BY MOUTH 2 TIMES DAILY 60 tablet 2  . nitroGLYCERIN (NITROSTAT) 0.4 MG SL tablet Place 1 tablet (0.4 mg total) under the tongue every 5 (five) minutes as needed for chest pain. 25 tablet 12  . OMEGA-3 FATTY ACIDS PO Take by mouth daily.    . pantoprazole (PROTONIX) 40 MG tablet Take 1 tablet (40 mg total) by mouth as directed. 40 MG DAILY X 2 WEEKS THEN ONLY AS NEEDED 30 tablet 3  . potassium chloride SA (K-DUR,KLOR-CON) 20 MEQ tablet Take 20 mEq  by mouth daily.      No current facility-administered medications on file prior to visit.    Past Medical History  Diagnosis Date  . NSTEMI (non-ST elevated myocardial infarction) ([place])     with only mild to moderate CAD in September of 2011,There was concern for apival ballooning. >> ? Tako-Tsubo syndrome vs vasospasm  . HTN (hypertension)   . COPD (chronic obstructive pulmonary disease) ([place])   . Lung cancer [facility])     [name]  Marland Kitchen Asthma   . CAD (coronary artery disease)     a. LHC 9/11 (in setting of NSTEMI): LAD irregs, mLCx  50, pRCA 50, inf-apical AK;  b. Myoview 9/13: Normal stress nuclear study.  LV Ejection Fraction: 71%   . Chronic diastolic CHF (congestive heart failure) ([place])     a. Echo 7/15: Mild LVH, mild focal basal septal hypertrophy, EF 50-55%, no RWMA, Gr 1 DD  . Carotid stenosis     a. Carotid US 9/13: bilat 0-39%;  b. Carotid US 7/15: no sig stenosis    Past Surgical History  Procedure Laterality Date  . Knee surgery    . Vesicovaginal fistula closure w/ tah    . Gallbladder surgery    . Cataract surgery    . Foot surgery    . Lung lobectomy      Family History  Problem Relation Age of Onset  . Heart disease      POSITIVE FAMILY HX. OF   . Pancreatitis      QUESTIONABLE FROM ACE INHIBITORS  . Heart disease Mother 27  . Asthma Mother   . Diabetes Mother   . Cancer Mother     Salivary  . Emphysema Mother   . COPD Sister     Social History   Social History  . Marital Status: Divorced    Spouse Name: N/A  . Number of Children: N/A  . Years of Education: N/A   Social History Main Topics  . Smoking status: Former Smoker -- 1.50 packs/day for 42 years    Types: Cigarettes    Quit date: 04/26/2006  . Smokeless tobacco: Never Used  . Alcohol Use: No  . Drug Use: No  . Sexual Activity: Not Asked   Other Topics Concern  . None   Social History Narrative   Originally from [place]. Always lived in [place]. Prior travel to [facility]. 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 Has a dog at home. No birds, mold, or hot tub exposure. No indoor plants. No carpet in her home. Does have draperies.       Objective:   Physical Exam BP 132/78 mmHg  Pulse 96  Ht '5\' 1"'$  (1.549 m)  Wt 215 lb (97.523 kg)  BMI 40.64 kg/m2  SpO2 94% General:  Awake. Alert. No acute distress. Obese.  Integument:  Warm & dry. No rash on exposed skin. No bruising. Lymphatics:  No appreciated cervical or supraclavicular lymphadenoapthy. HEENT:  Moist mucus membranes. No oral ulcers. No  scleral injection or icterus. Mild bilateral nasal turbinate swelling. Cardiovascular:  Regular rate. Trace edema. No appreciable JVD.  Pulmonary:  Intermittent bilateral squeaks and wheezing at terminal exhalation. Symmetric chest wall expansion. Mildly increased work of breathing. Abdomen: Soft. Normal bowel sounds. Protuberant. Grossly nontender. Musculoskeletal:  Normal bulk and tone. Hand grip strength 5/5 bilaterally. No joint deformity or effusion appreciated. Neurological:  CN 2-12 grossly in tact. No meningismus. Moving all 4 extremities equally. Symmetric brachioradialis deep tendon reflexes. Psychiatric:  Mood and affect congruent. Speech normal rhythm, rate & tone.   IMAGING CT  CHEST W/O 08/04/15 (personally reviewed by me): Volume loss with postsurgical change in left lung. Low-attenuation soft tissue density along posterior & superior aspect surgical margin. 9 mm irregular nodule right lung apex. 4 mm triangular groundglass nodule right minor fissure. 6 mm nodule right lower lobe. Or millimeter nodule right middle lobe. Additional tiny bilateral pulmonary nodules. No pleural effusion or thickening. No pathologic hilar, mediastinal, or axillary lymphadenopathy. Precarinal lymph node with a fatty hilum. Nodule in right apex is definitely new when compared with prior imaging.   CXR PA/LAT 01/01/15 (personally reviewed by me): Blunting of right posterior cardiac angle. No pleural effusion appreciated. Fullness in the left hilum that appears chronic and is likely residual scar tissue from prior surgery. No new nodule or opacity appreciated. Heart normal in size. Mediastinum otherwise normal in contour.   CT CHEST W/ 12/15/11 (personally reviewed by me): 6 mm nodule in right lower lobe that is noncalcified and rounded per radiologist present since 01/08/09 & unchanged. No pathologic mediastinal or hilar adenopathy. Soft tissue within the anterior mediastinum corresponding to fullness in the left  hilum. Subcentimeter nodular density in left upper lobe reportedly unchanged. Hepatic steatosis noted.   CARDIAC TTE (07/04/15): LV normal in size with EF 55%. No regional wall motion abnormalities. Grade 1 diastolic dysfunction. LA & RA normal in size. RV normal in size and function. No aortic stenosis or regurgitation. No mitral stenosis. No pulmonic regurgitation. No significant tricuspid regurgitation. No pericardial effusion.  NUCLEAR STRESS TEST (07/03/15): Normal study with low risk. No T-wave inversion or ST segment deviation. EF 66% with hyperdynamic LV.  PATHOLOGY L Parotid Gland FNA (11/18/14): Benign fibrofatty soft tissue. No features of salivary gland present. LUL Wedge Resection (02/10/09): Invasive adenocarcinoma 2.0 cm LUL Additional Parenchymal Margin (02/10/09): No tumor identified 4L Lymph Node (02/10/09): No tumor identified 10L Lymph Node (02/10/09): No tumor identified  LABS 06/20/15 BMP: 143/4.7/99/33/14/0.71/88/9.4 BNP: 62  01/16/15 CBC: 16.7/15.0/45.6/161 ProBNP: 33  02/06/09 ABG on RA: 7.395 / 42.7 / 125 / 98.6%    Assessment & Plan:  80 year old female remotely diagnosed with COPD and also previously having underwent left upper lobe wedge resection for non-small cell lung cancer. Patient did have radiation seeds placed reportedly. Patient's degree of dyspnea is certainly consistent with COPD. I believe we will need further pulmonary function testing to evaluate her lung function and determine whether or not additional inhaler or nebulizer medications may be of benefit. Certainly her uncontrolled reflux and dysphagia are contributing to her cough and sputum production. I spent a significant amount of time today educating the patient on the need for appropriate lifestyle modification. Previously intolerant of Protonix therefore is not taking. She likely has some element of overlap with an allergic phenotype and may benefit from Singulair therapy. I instructed the  patient contact my office if she had any new breathing problems or questions before her next appointment.  1. Bilateral Lung Nodules:  Repeat CT chest without contrast July 2017. 2. COPD: Continuing patient on Symbicort with a spacer. Checking full pulmonary function testing as well as 6 prolonged test on room air on her before next appointment. 3. GERD with dysphagia: Patient counseled on appropriate lifestyle modifications. Starting Zantac 150 mg by mouth daily at bedtime. Checking barium swallow/esophagogram. 4. Allergic rhinitis: Checking IgE &  RAST panel. Checking maxillofacial CT scan without contrast. Starting patient on Singulair 5 mg by mouth daily at bedtime. Patient counseled to hold on Flonase for now. 5. Follow-up: Patient to return to clinic in 4-6 weeks.  Sonia Baller Ashok Cordia, M.D. [facility] Pager:  [phone] After 3pm or if no response, call [phone] 11:31 AM 08/29/2015

## 2015-09-01 LAB — ALLERGY PANEL, REGION 2, GRASSES
Allergen, Orchard(Cocksfoot) g3: 0.13 kU/L — ABNORMAL HIGH
G005 RYE, PERENNIAL: 0.15 kU/L — AB
G009 Red Top: 0.12 kU/L — ABNORMAL HIGH
JOHNSON GRASS: 0.14 kU/L — AB
TIMOTHY GRASS: 0.12 kU/L — AB

## 2015-09-01 LAB — IGE: IGE (IMMUNOGLOBULIN E), SERUM: 15 kU/L (ref <115)

## 2015-09-02 ENCOUNTER — Ambulatory Visit (INDEPENDENT_AMBULATORY_CARE_PROVIDER_SITE_OTHER)
Admission: RE | Admit: 2015-09-02 | Discharge: 2015-09-02 | Disposition: A | Discharge status: Home / Self Care | Payer: Medicare Other | Source: Ambulatory Visit | Attending: Pulmonary Disease | Admitting: Pulmonary Disease

## 2015-09-02 DIAGNOSIS — R1310 Dysphagia, unspecified: Secondary | ICD-10-CM | POA: Diagnosis not present

## 2015-09-03 ENCOUNTER — Other Ambulatory Visit: Payer: Medicare Other

## 2015-09-03 DIAGNOSIS — J449 Chronic obstructive pulmonary disease, unspecified: Secondary | ICD-10-CM

## 2015-09-04 ENCOUNTER — Telehealth: Payer: Self-pay | Admitting: Pulmonary Disease

## 2015-09-04 NOTE — Telephone Encounter (Signed)
Results have been explained to patient, pt expressed understanding. Nothing further needed.  Notes Recorded by [name], MD on 09/02/2015 at 6:08 PM Please call the patient and let her know I reviewed her CT scan report. She does have some mild changes in her sinuses from her allergies. The Singulair that we started at her appointment should help. Thanks.

## 2015-09-05 ENCOUNTER — Telehealth: Payer: Self-pay | Admitting: General Practice

## 2015-09-05 ENCOUNTER — Telehealth: Payer: Self-pay | Admitting: Pulmonary Disease

## 2015-09-05 NOTE — Telephone Encounter (Signed)
Spoke with pt. Advised her that her sputum sample could not be completed. She states that since starting the medication that JN prescribed she is not producing as much mucus. Pt is feeling much better with her new medication regimen.  JN - please advise if you would like to have this test repeated or not. Thanks.

## 2015-09-05 NOTE — Telephone Encounter (Signed)
Patient returned call, CB is [phone].  She will be there until 3:45 pm today.

## 2015-09-05 NOTE — Telephone Encounter (Signed)
lmtcb x1 for pt. 

## 2015-09-05 NOTE — Telephone Encounter (Signed)
Spoke with pt.  Discussed below per JN.  Pt verbalized understanding and will call back if symptoms worsen.  Pt in agreement with plan and voiced no further questions or concerns at this time.

## 2015-09-05 NOTE — Telephone Encounter (Signed)
Would like to know if you would take her on as a patient?  Please advise.

## 2015-09-05 NOTE — Telephone Encounter (Signed)
If she's feeling better and her cough is improving don't worry about repeating her sputum. Glad everything is working. JN.

## 2015-09-07 NOTE — Telephone Encounter (Signed)
yes

## 2015-09-08 ENCOUNTER — Telehealth: Payer: Self-pay | Admitting: Pulmonary Disease

## 2015-09-08 ENCOUNTER — Ambulatory Visit (HOSPITAL_COMMUNITY)
Admission: RE | Admit: 2015-09-08 | Discharge: 2015-09-08 | Disposition: A | Discharge status: Home / Self Care | Payer: Medicare Other | Source: Ambulatory Visit | Attending: Pulmonary Disease | Admitting: Pulmonary Disease

## 2015-09-08 DIAGNOSIS — R1310 Dysphagia, unspecified: Secondary | ICD-10-CM | POA: Insufficient documentation

## 2015-09-08 DIAGNOSIS — K449 Diaphragmatic hernia without obstruction or gangrene: Secondary | ICD-10-CM | POA: Diagnosis not present

## 2015-09-08 NOTE — Telephone Encounter (Signed)
Left vm to call back to schedule appt.  °

## 2015-09-08 NOTE — Telephone Encounter (Signed)
Spoke with Deanna and they are requesting the OV notes from the referral appointment. Fax [phone]. Notes faxed.

## 2015-09-09 ENCOUNTER — Telehealth: Payer: Self-pay | Admitting: Pulmonary Disease

## 2015-09-09 NOTE — Progress Notes (Signed)
Quick Note:  LMTCB x2 ______

## 2015-09-09 NOTE — Telephone Encounter (Signed)
LM x 1 for pt to advise there are no new results. Already explained on 09/04/15  Virl Cagey, CMA at 09/04/2015 9:10 AM     Status: Signed       Expand All Collapse All   Results have been explained to patient, pt expressed understanding. Nothing further needed.  Notes Recorded by [name], MD on 09/02/2015 at 6:08 PM Please call the patient and let her know I reviewed her CT scan report. She does have some mild changes in her sinuses from her allergies. The Singulair that we started at her appointment should help. Thanks.

## 2015-09-10 NOTE — Telephone Encounter (Signed)
Spoke with pt. Advised her that I would get this message to JN to address results of esophogram.  JN - please advise. Thanks.

## 2015-09-10 NOTE — Telephone Encounter (Signed)
Patient states she is calling in reference to a swallowing test she had done, not a CT report, [phone]

## 2015-09-12 NOTE — Telephone Encounter (Signed)
LMTCB

## 2015-09-12 NOTE — Telephone Encounter (Signed)
Patient returned call, CB is [phone]

## 2015-09-12 NOTE — Telephone Encounter (Signed)
Please let the patient know I reviewed her report from her esophagram. There was only a small hiatal hernia. She didn't have any other abnormality. Thanks.

## 2015-09-12 NOTE — Telephone Encounter (Signed)
Results have been explained to patient, pt expressed understanding. Nothing further needed.  

## 2015-10-07 ENCOUNTER — Ambulatory Visit (INDEPENDENT_AMBULATORY_CARE_PROVIDER_SITE_OTHER): Payer: Medicare Other | Admitting: Internal Medicine

## 2015-10-07 ENCOUNTER — Other Ambulatory Visit (INDEPENDENT_AMBULATORY_CARE_PROVIDER_SITE_OTHER): Payer: Medicare Other

## 2015-10-07 ENCOUNTER — Encounter: Payer: Self-pay | Admitting: Internal Medicine

## 2015-10-07 VITALS — BP 106/80 | HR 73 | Temp 98.7°F | Resp 16 | Ht 61.0 in | Wt 215.0 lb

## 2015-10-07 DIAGNOSIS — I2510 Atherosclerotic heart disease of native coronary artery without angina pectoris: Secondary | ICD-10-CM

## 2015-10-07 DIAGNOSIS — E2839 Other primary ovarian failure: Secondary | ICD-10-CM | POA: Insufficient documentation

## 2015-10-07 DIAGNOSIS — Z1212 Encounter for screening for malignant neoplasm of rectum: Secondary | ICD-10-CM

## 2015-10-07 DIAGNOSIS — E785 Hyperlipidemia, unspecified: Secondary | ICD-10-CM

## 2015-10-07 DIAGNOSIS — R739 Hyperglycemia, unspecified: Secondary | ICD-10-CM

## 2015-10-07 DIAGNOSIS — K219 Gastro-esophageal reflux disease without esophagitis: Secondary | ICD-10-CM | POA: Diagnosis not present

## 2015-10-07 DIAGNOSIS — Z1231 Encounter for screening mammogram for malignant neoplasm of breast: Secondary | ICD-10-CM

## 2015-10-07 DIAGNOSIS — Z1211 Encounter for screening for malignant neoplasm of colon: Secondary | ICD-10-CM | POA: Insufficient documentation

## 2015-10-07 LAB — CBC WITH DIFFERENTIAL/PLATELET
BASOS PCT: 0.9 % (ref 0.0–3.0)
Basophils Absolute: 0.1 10*3/uL (ref 0.0–0.1)
EOS PCT: 1.8 % (ref 0.0–5.0)
Eosinophils Absolute: 0.2 10*3/uL (ref 0.0–0.7)
HCT: 44.3 % (ref 36.0–46.0)
Hemoglobin: 14.4 g/dL (ref 12.0–15.0)
LYMPHS ABS: 2.7 10*3/uL (ref 0.7–4.0)
Lymphocytes Relative: 22 % (ref 12.0–46.0)
MCHC: 32.5 g/dL (ref 30.0–36.0)
MCV: 86.7 fl (ref 78.0–100.0)
MONO ABS: 1.1 10*3/uL — AB (ref 0.1–1.0)
MONOS PCT: 8.7 % (ref 3.0–12.0)
NEUTROS ABS: 8.2 10*3/uL — AB (ref 1.4–7.7)
NEUTROS PCT: 66.6 % (ref 43.0–77.0)
Platelets: 309 10*3/uL (ref 150.0–400.0)
RBC: 5.11 Mil/uL (ref 3.87–5.11)
RDW: 14.6 % (ref 11.5–15.5)
WBC: 12.3 10*3/uL — ABNORMAL HIGH (ref 4.0–10.5)

## 2015-10-07 LAB — LIPID PANEL
CHOL/HDL RATIO: 3
Cholesterol: 135 mg/dL (ref 0–200)
HDL: 43.8 mg/dL (ref >39.00)
LDL Cholesterol: 53 mg/dL (ref 0–99)
NONHDL: 91.29
Triglycerides: 189 mg/dL — ABNORMAL HIGH (ref 0.0–149.0)
VLDL: 37.8 mg/dL (ref 0.0–40.0)

## 2015-10-07 LAB — TSH: TSH: 1.9 u[IU]/mL (ref 0.35–4.50)

## 2015-10-07 LAB — COMPREHENSIVE METABOLIC PANEL
ALT: 18 U/L (ref 0–35)
AST: 14 U/L (ref 0–37)
Albumin: 4.1 g/dL (ref 3.5–5.2)
Alkaline Phosphatase: 126 U/L — ABNORMAL HIGH (ref 39–117)
BILIRUBIN TOTAL: 0.3 mg/dL (ref 0.2–1.2)
BUN: 21 mg/dL (ref 6–23)
CO2: 34 meq/L — AB (ref 19–32)
CREATININE: 0.74 mg/dL (ref 0.40–1.20)
Calcium: 9.5 mg/dL (ref 8.4–10.5)
Chloride: 103 mEq/L (ref 96–112)
GFR: 80.6 mL/min (ref >60.00)
GLUCOSE: 109 mg/dL — AB (ref 70–99)
POTASSIUM: 4.1 meq/L (ref 3.5–5.1)
SODIUM: 145 meq/L (ref 135–145)
Total Protein: 6.9 g/dL (ref 6.0–8.3)

## 2015-10-07 LAB — HEMOGLOBIN A1C: Hgb A1c MFr Bld: 5.8 % (ref 4.6–6.5)

## 2015-10-07 NOTE — Progress Notes (Signed)
Subjective:  Patient ID: Brandy Cameron, female    DOB: 1936/01/30  Age: 80 y.o. MRN: [id number]  CC: Gastroesophageal Reflux; Hyperlipidemia; and Coronary Artery Disease  NEW TO ME  HPI Jayme M Elrod presents for follow up and est a new PCP.  She has a history of prediabetes, COPD, lung cancer, GERD, diastolic heart failure, hypercholesterolemia, and a remote history of pancreatitis. Today she feels well and offers no new or different complaints. She has chronic shortness of breath related to her COPD but has had no recent episodes of chest pain, diaphoresis, dyspnea on exertion, palpitations, edema, or fatigue. Her GERD is adequately well controlled with an H2 blocker.  History Caelen has a past medical history of NSTEMI (non-ST elevated myocardial infarction) ([place]); HTN (hypertension); COPD (chronic obstructive pulmonary disease) ([place]); Lung cancer ([place]); Asthma; CAD (coronary artery disease); Chronic diastolic CHF (congestive heart failure) ([place]); and Carotid stenosis.   She has past surgical history that includes Knee surgery; Vesicovaginal fistula closure w/ TAH; Gallbladder surgery; CATARACT SURGERY; Foot surgery; and Lung lobectomy.   Her family history includes Asthma in her mother; COPD in her sister; Cancer in her mother; Diabetes in her mother; Emphysema in her mother; Heart disease (age of onset: 65) in her mother.She reports that she quit smoking about 9 years ago. Her smoking use included Cigarettes. She has a 63 pack-year smoking history. She has never used smokeless tobacco. She reports that she does not drink alcohol or use illicit drugs.  Outpatient Prescriptions Prior to Visit  Medication Sig Dispense Refill  . aspirin 81 MG tablet Take 81 mg by mouth daily.      Marland Kitchen atorvastatin (LIPITOR) 20 MG tablet Take 20 mg by mouth daily.  3  . budesonide-formoterol (SYMBICORT) 160-4.5 MCG/ACT inhaler Inhale 2 puffs into the lungs 2 (two) times daily.    . Cholecalciferol  (VITAMIN D3) 1000 units CAPS Take 1,000 Units by mouth daily.    . Coenzyme Q10 (CO Q-10) 100 MG CAPS Take 1 tablet by mouth daily.    . fluticasone (FLONASE) 50 MCG/ACT nasal spray Place 2 sprays into both nostrils daily. 9.9 g 2  . furosemide (LASIX) 40 MG tablet Take 1 tablet (40 mg total) by mouth 2 (two) times daily. 60 tablet 12  . isosorbide mononitrate (IMDUR) 30 MG 24 hr tablet Take 1 tablet (30 mg total) by mouth daily. 30 tablet 11  . ketorolac (ACULAR) 0.5 % ophthalmic solution Place 1 drop into both eyes 4 (four) times daily. 5 mL 0  . losartan (COZAAR) 100 MG tablet Take 1 tablet (100 mg total) by mouth daily. 30 tablet 12  . metoprolol tartrate (LOPRESSOR) 25 MG tablet TAKE 1 TABLET BY MOUTH 2 TIMES DAILY 60 tablet 2  . montelukast (SINGULAIR) 10 MG tablet Take 1 tablet (10 mg total) by mouth at bedtime. 30 tablet 3  . nitroGLYCERIN (NITROSTAT) 0.4 MG SL tablet Place 1 tablet (0.4 mg total) under the tongue every 5 (five) minutes as needed for chest pain. 25 tablet 12  . OMEGA-3 FATTY ACIDS PO Take by mouth daily.    . potassium chloride SA (K-DUR,KLOR-CON) 20 MEQ tablet Take 20 mEq by mouth daily.     . ranitidine (ZANTAC) 150 MG tablet Take 1 tablet (150 mg total) by mouth at bedtime. 30 tablet 3  . Spacer/Aero-Holding Chambers (AEROCHAMBER MV) inhaler Use as instructed 1 each 0   No facility-administered medications prior to visit.    ROS Review of Systems  Constitutional: Negative.  Negative for diaphoresis, activity change, appetite change, fatigue and unexpected weight change.  HENT: Negative.  Negative for trouble swallowing.   Eyes: Negative.   Respiratory: Positive for shortness of breath. Negative for cough, choking, chest tightness, wheezing and stridor.   Cardiovascular: Negative.  Negative for chest pain, palpitations and leg swelling.  Gastrointestinal: Negative.  Negative for nausea, vomiting, abdominal pain, diarrhea and constipation.  Endocrine: Negative.   Negative for polydipsia, polyphagia and polyuria.  Genitourinary: Negative.  Negative for difficulty urinating.  Musculoskeletal: Negative.  Negative for myalgias, back pain, arthralgias and neck pain.  Skin: Negative.   Allergic/Immunologic: Negative.   Neurological: Negative.  Negative for dizziness, weakness and light-headedness.  Hematological: Negative for adenopathy. Does not bruise/bleed easily.  Psychiatric/Behavioral: Negative.     Objective:  BP 106/80 mmHg  Pulse 73  Temp(Src) 98.7 F (37.1 C) (Oral)  Resp 16  Ht '5\' 1"'$  (1.549 m)  Wt 215 lb (97.523 kg)  BMI 40.64 kg/m2  SpO2 97%  Physical Exam  Constitutional: She is oriented to person, place, and time. No distress.  HENT:  Mouth/Throat: Oropharynx is clear and moist. No oropharyngeal exudate.  Eyes: Conjunctivae are normal. Right eye exhibits no discharge. Left eye exhibits no discharge. No scleral icterus.  Neck: Normal range of motion. Neck supple. No JVD present. No tracheal deviation present. No thyromegaly present.  Cardiovascular: Normal rate, regular rhythm, normal heart sounds and intact distal pulses.  Exam reveals no gallop and no friction rub.   No murmur heard. Pulmonary/Chest: Effort normal and breath sounds normal. No stridor. No respiratory distress. She has no wheezes. She has no rales. She exhibits no tenderness.  Abdominal: Soft. Bowel sounds are normal. She exhibits no distension and no mass. There is no tenderness. There is no rebound and no guarding.  Musculoskeletal: Normal range of motion. She exhibits no edema or tenderness.  Lymphadenopathy:    She has no cervical adenopathy.  Neurological: She is oriented to person, place, and time.  Skin: Skin is warm and dry. No rash noted. She is not diaphoretic. No erythema. No pallor.  Vitals reviewed.   Lab Results  Component Value Date   WBC 12.3* 10/07/2015   HGB 14.4 10/07/2015   HCT 44.3 10/07/2015   PLT 309.0 10/07/2015   GLUCOSE 109*  10/07/2015   CHOL 135 10/07/2015   TRIG 189.0* 10/07/2015   HDL 43.80 10/07/2015   LDLCALC 53 10/07/2015   ALT 18 10/07/2015   AST 14 10/07/2015   NA 145 10/07/2015   K 4.1 10/07/2015   CL 103 10/07/2015   CREATININE 0.74 10/07/2015   BUN 21 10/07/2015   CO2 34* 10/07/2015   TSH 1.90 10/07/2015   INR 1.00 11/22/2014   HGBA1C 5.8 10/07/2015    Assessment & Plan:   Casee was seen today for gastroesophageal reflux, hyperlipidemia and coronary artery disease.  Diagnoses and all orders for this visit:  Coronary artery disease involving native coronary artery of native heart without angina pectoris- She has had no symptoms of angina recently, she hasn't used nitroglycerin in months, will continue to modify her risk factors with statin therapy and blood pressure control. -     Lipid panel; Future  Hyperlipidemia with target LDL less than 70- she has achieved her LDL goal is doing well on the statin. -     Lipid panel; Future -     Comprehensive metabolic panel; Future -     TSH; Future  Gastroesophageal reflux disease  without esophagitis- she reports no atypical symptoms today and her CBC shows that she is not anemic, she does have a mild leukocytosis on her CBC but she has had this finding for nearly 4 years with no development of anemia and her other cell lines appear normal. She quit smoking nearly 10 years ago so I don't think this is related to tobacco abuse. Will continue to follow this and monitor for the development of lymphoproliferative disease. -     CBC with Differential/Platelet; Future  Hyperglycemia- her prior A1c was 6.5% but now it's down to 5.8%. She has mild prediabetes. No medications are needed, she will continue to work on her lifestyle modifications. -     Comprehensive metabolic panel; Future -     Hemoglobin A1c; Future  Menopause ovarian failure -     DG Bone Density; Future  Visit for screening mammogram -     MM DIGITAL SCREENING BILATERAL;  Future   I am having Ms. Chubbuck maintain her aspirin, potassium chloride SA, losartan, metoprolol tartrate, budesonide-formoterol, furosemide, nitroGLYCERIN, OMEGA-3 FATTY ACIDS PO, Co Q-10, isosorbide mononitrate, fluticasone, ketorolac, Vitamin D3, atorvastatin, montelukast, ranitidine, AEROCHAMBER MV, albuterol, and OXYGEN.  Meds ordered this encounter  Medications  . albuterol (PROVENTIL) (2.5 MG/3ML) 0.083% nebulizer solution    Sig:   . OXYGEN    Sig: Inhale into the lungs. 2 liters at nights     Follow-up: Return in about 4 months (around 02/06/2016).  [name], MD

## 2015-10-07 NOTE — Patient Instructions (Signed)

## 2015-10-07 NOTE — Progress Notes (Signed)
Pre visit review using our clinic review tool, if applicable. No additional management support is needed unless otherwise documented below in the visit note. 

## 2015-10-13 ENCOUNTER — Ambulatory Visit: Payer: Medicare Other | Admitting: Pulmonary Disease

## 2015-10-13 ENCOUNTER — Ambulatory Visit: Payer: Medicare Other

## 2015-10-13 ENCOUNTER — Telehealth: Payer: Self-pay

## 2015-10-13 ENCOUNTER — Encounter (HOSPITAL_COMMUNITY): Payer: Medicare Other

## 2015-10-13 NOTE — Telephone Encounter (Signed)
Faxed cologuard form

## 2015-10-14 ENCOUNTER — Ambulatory Visit (INDEPENDENT_AMBULATORY_CARE_PROVIDER_SITE_OTHER): Payer: Medicare Other | Admitting: Pulmonary Disease

## 2015-10-14 ENCOUNTER — Encounter: Payer: Self-pay | Admitting: Pulmonary Disease

## 2015-10-14 ENCOUNTER — Encounter (HOSPITAL_COMMUNITY): Payer: Medicare Other

## 2015-10-14 ENCOUNTER — Other Ambulatory Visit (INDEPENDENT_AMBULATORY_CARE_PROVIDER_SITE_OTHER): Payer: Medicare Other

## 2015-10-14 VITALS — BP 120/80 | HR 72 | Ht 61.0 in | Wt 215.0 lb

## 2015-10-14 DIAGNOSIS — K219 Gastro-esophageal reflux disease without esophagitis: Secondary | ICD-10-CM

## 2015-10-14 DIAGNOSIS — R0609 Other forms of dyspnea: Secondary | ICD-10-CM | POA: Diagnosis not present

## 2015-10-14 DIAGNOSIS — J9610 Chronic respiratory failure, unspecified whether with hypoxia or hypercapnia: Secondary | ICD-10-CM | POA: Insufficient documentation

## 2015-10-14 DIAGNOSIS — I2510 Atherosclerotic heart disease of native coronary artery without angina pectoris: Secondary | ICD-10-CM

## 2015-10-14 DIAGNOSIS — J449 Chronic obstructive pulmonary disease, unspecified: Secondary | ICD-10-CM | POA: Diagnosis not present

## 2015-10-14 DIAGNOSIS — J309 Allergic rhinitis, unspecified: Secondary | ICD-10-CM

## 2015-10-14 DIAGNOSIS — J9611 Chronic respiratory failure with hypoxia: Secondary | ICD-10-CM

## 2015-10-14 DIAGNOSIS — R0789 Other chest pain: Secondary | ICD-10-CM

## 2015-10-14 LAB — TROPONIN I: TNIDX: 0.01 ug/L (ref 0.00–0.06)

## 2015-10-14 LAB — CREATININE KINASE MB: CK-MB: 1.6 ng/mL (ref 0.3–4.0)

## 2015-10-14 MED ORDER — ALBUTEROL SULFATE HFA 108 (90 BASE) MCG/ACT IN AERS: 2.0000 | INHALATION_SPRAY | RESPIRATORY_TRACT | Status: DC | PRN

## 2015-10-14 NOTE — Progress Notes (Signed)
Test reviewed.  

## 2015-10-14 NOTE — Progress Notes (Signed)
Subjective:    Patient ID: Brandy Cameron, female    DOB: [birth year], 80 y.o.   MRN: [id number]  C.C.:  Follow-up for COPD, Bilateral Lung Nodules, GERD w/ Dysphagia, & Allergic Rhinitis.  HPI  Chest Pain:  She reports 3 weeks ago she had substernal chest pain that resolved after 2 sublingual nitroglycerin. She reports today she had a fullness in her chest with exertion. She denies any frank chest pain but did have tightness during her walk today.   COPD: Prescribed Symbicort. She reports continued significant dyspnea on exertion. She has had intermittent coughing but has been wheezing more today. Compliant with Symbicort with a spacer daily. She hasn't filled her rescue inhaler. She is using albuterol in her nebulizer intermittently.   Bilateral Lung Nodules:  Repeat CT chest without contrast July 2017.  GERD w/ Dysphagia: Started Zantac 150 mg by mouth daily at bedtime. Patient had small hiatal hernia but no identified reflux on barium swallow. She is still having eructation and refluxing food into her mouth. She feels her symptoms have improved on Zantac.  Allergic Rhinitis: Started patient on Singulair 5 mg by mouth daily at bedtime at last appointment. Maxillofacial CT shows only mild inflammation. She reports mild improvement in sinus congestion or pressure. She hasn't been using her Flonase.  Review of Systems No syncope or near syncope. She has had some nausea this morning. No abdominal pain. No fever, chills, or sweats.   Allergies  Allergen Reactions  . Sulfa Antibiotics Rash and Shortness Of Breath  . Amlodipine   . Pantoprazole Nausea And Vomiting  . Sulfonamide Derivatives Rash    Current Outpatient Prescriptions on File Prior to Visit  Medication Sig Dispense Refill  . albuterol (PROVENTIL) (2.5 MG/3ML) 0.083% nebulizer solution     . aspirin 81 MG tablet Take 81 mg by mouth daily.      Marland Kitchen atorvastatin (LIPITOR) 20 MG tablet Take 20 mg by mouth daily.  3  .  budesonide-formoterol (SYMBICORT) 160-4.5 MCG/ACT inhaler Inhale 2 puffs into the lungs 2 (two) times daily.    . Cholecalciferol (VITAMIN D3) 1000 units CAPS Take 1,000 Units by mouth daily.    . Coenzyme Q10 (CO Q-10) 100 MG CAPS Take 1 tablet by mouth daily.    Marland Kitchen losartan (COZAAR) 100 MG tablet Take 1 tablet (100 mg total) by mouth daily. 30 tablet 12  . metoprolol tartrate (LOPRESSOR) 25 MG tablet TAKE 1 TABLET BY MOUTH 2 TIMES DAILY 60 tablet 2  . montelukast (SINGULAIR) 10 MG tablet Take 1 tablet (10 mg total) by mouth at bedtime. 30 tablet 3  . nitroGLYCERIN (NITROSTAT) 0.4 MG SL tablet Place 1 tablet (0.4 mg total) under the tongue every 5 (five) minutes as needed for chest pain. 25 tablet 12  . OMEGA-3 FATTY ACIDS PO Take by mouth daily.    . OXYGEN Inhale into the lungs. 2 liters at nights    . potassium chloride SA (K-DUR,KLOR-CON) 20 MEQ tablet Take 20 mEq by mouth daily.     Marland Kitchen Spacer/Aero-Holding Chambers (AEROCHAMBER MV) inhaler Use as instructed 1 each 0   No current facility-administered medications on file prior to visit.    Past Medical History  Diagnosis Date  . NSTEMI (non-ST elevated myocardial infarction) ([place])     with only mild to moderate CAD in September of 2011,There was concern for apival ballooning. >> ? Tako-Tsubo syndrome vs vasospasm  . HTN (hypertension)   . COPD (chronic obstructive pulmonary disease) ([place])   .  Lung cancer [facility])     [name]  Marland Kitchen Asthma   . CAD (coronary artery disease)     a. LHC 9/11 (in setting of NSTEMI): LAD irregs, mLCx 50, pRCA 50, inf-apical AK;  b. Myoview 9/13: Normal stress nuclear study.  LV Ejection Fraction: 71%   . Chronic diastolic CHF (congestive heart failure) ([place])     a. Echo 7/15: Mild LVH, mild focal basal septal hypertrophy, EF 50-55%, no RWMA, Gr 1 DD  . Carotid stenosis     a. Carotid US 9/13: bilat 0-39%;  b. Carotid US 7/15: no  sig stenosis    Past Surgical History  Procedure Laterality Date  . Knee surgery    . Vesicovaginal fistula closure w/ tah    . Gallbladder surgery    . Cataract surgery    . Foot surgery    . Lung lobectomy      Family History  Problem Relation Age of Onset  . Heart disease      POSITIVE FAMILY HX. OF   . Pancreatitis      QUESTIONABLE FROM ACE INHIBITORS  . Heart disease Mother 82  . Asthma Mother   . Diabetes Mother   . Cancer Mother     Salivary  . Emphysema Mother   . COPD Sister     Social History   Social History  . Marital Status: Divorced    Spouse Name: N/A  . Number of Children: N/A  . Years of Education: N/A   Social History Main Topics  . Smoking status: Former Smoker -- 1.50 packs/day for 42 years    Types: Cigarettes    Quit date: 04/26/2006  . Smokeless tobacco: Never Used  . Alcohol Use: No  . Drug Use: No  . Sexual Activity: Not Asked   Other Topics Concern  . None   Social History Narrative   Originally from [place]. Always lived in [place]. Prior travel to [facility]. 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 Has a dog at home. No birds, mold, or hot tub exposure. No indoor plants. No carpet in her home. Does have draperies.       Objective:   Physical Exam BP 120/80 mmHg  Pulse 72  Ht '5\' 1"'$  (1.549 m)  Wt 215 lb (97.523 kg)  BMI 40.64 kg/m2  SpO2 96% General:  Awake. Alert. No distress. Mild central obesity. Integument:  Warm & dry. No rash on exposed skin.  Lymphatics:  No appreciated cervical or supraclavicular lymphadenoapthy. HEENT:  Moist mucus membranes. No oral ulcers. No scleral injection or icterus. Mild bilateral nasal turbinate swelling. Cardiovascular:  Regular rate. Trace edema persists. Normal S1 & S2.  Pulmonary:  Clear bilaterally to auscultation. Normal work of breathing on supplemental oxygen. Speaking in complete sentences.  6MWT 10/14/15:  Baseline Sat 90% on RA at rest / Nadir Sat 87% on RA  (required 2 L/m with exertion to maintain & test stopped early due to dyspnea and chest discomfort)  IMAGING BARIUM SWALLOW 09/08/15 (per radiologist): Patent esophagus. No stricture or mass. Small hiatal hernia. Mildly abnormal motility. No reflux.  MAXILLOFACIAL CT W/O 09/02/15 (per radiologist): Mild mucosal thickening inferior lateral/posterior aspect right maxillary sinus. No air-fluid levels.  CT  CHEST W/O 08/04/15 (previously reviewed by me): Volume loss with postsurgical change in left lung. Low-attenuation soft tissue density along posterior & superior aspect surgical margin.  9 mm irregular nodule right lung apex. 4 mm triangular groundglass nodule right minor fissure. 6 mm nodule right lower lobe. Or millimeter nodule right middle lobe. Additional tiny bilateral pulmonary nodules. No pleural effusion or thickening. No pathologic hilar, mediastinal, or axillary lymphadenopathy. Precarinal lymph node with a fatty hilum. Nodule in right apex is definitely new when compared with prior imaging.   CXR PA/LAT 01/01/15 (previously reviewed by me): Blunting of right posterior cardiac angle. No pleural effusion appreciated. Fullness in the left hilum that appears chronic and is likely residual scar tissue from prior surgery. No new nodule or opacity appreciated. Heart normal in size. Mediastinum otherwise normal in contour.   CT CHEST W/ 12/15/11 (previously reviewed by me): 6 mm nodule in right lower lobe that is noncalcified and rounded per radiologist present since 01/08/09 & unchanged. No pathologic mediastinal or hilar adenopathy. Soft tissue within the anterior mediastinum corresponding to fullness in the left hilum. Subcentimeter nodular density in left upper lobe reportedly unchanged. Hepatic steatosis noted.   CARDIAC EKG 10/14/15 (personally reviewed by me): Normal sinus rhythm. QTC 403 ms. Isoelectric T-wave lead V2. Artifact lead V5. Otherwise no signs of ischemia or conduction abnormality.  TTE  (07/04/15): LV normal in size with EF 55%. No regional wall motion abnormalities. Grade 1 diastolic dysfunction. LA & RA normal in size. RV normal in size and function. No aortic stenosis or regurgitation. No mitral stenosis. No pulmonic regurgitation. No significant tricuspid regurgitation. No pericardial effusion.  NUCLEAR STRESS TEST (07/03/15): Normal study with low risk. No T-wave inversion or ST segment deviation. EF 66% with hyperdynamic LV.  PATHOLOGY L Parotid Gland FNA (11/18/14): Benign fibrofatty soft tissue. No features of salivary gland present. LUL Wedge Resection (02/10/09): Invasive adenocarcinoma 2.0 cm LUL Additional Parenchymal Margin (02/10/09): No tumor identified 4L Lymph Node (02/10/09): No tumor identified 10L Lymph Node (02/10/09): No tumor identified  MICROBIOLOGY SPUTUM CTX (09/03/15):  AFB ngtd / Fungal ngtd  LABS 08/29/15 IgE:  15 RAST Panel: [place] 0.14 / [place] 0.12 / [place] 0.15 / [place] 0.12  06/20/15 BMP: 143/4.7/99/33/14/0.71/88/9.4 BNP: 62  01/16/15 CBC: 16.7/15.0/45.6/161 ProBNP: 33  02/06/09 ABG on RA: 7.395 / 42.7 / 125 / 98.6%    Assessment & Plan:  80 year old female remotely diagnosed with COPD and also previously having underwent left upper lobe wedge resection for non-small cell lung cancer. Patient currently is asymptomatic at rest. I suspect that some of her dyspnea and chest discomfort with exertion is coming from her COPD. Certainly with her atypical chest discomfort relieved with nitroglycerin cardiac etiology must be considered but with a normal stress test in March I feel it's reasonable to perform serum labs today and have her follow-up with her cardiologist. I instructed the patient contact her cardiologist if her chest discomfort recurred and seek immediate medical attention. I am continuing the patient on her current inhaler and providing her with an albuterol rescue inhaler. We are also starting the patient on oxygen with  exertion today as I feel her hypoxia is likely contributing to some of her chest discomfort and dyspnea on exertion. Reflux seems to be mildly better with Zantac as is her allergic rhinitis on Singulair. I instructed the patient to contact my office if she developed any new breathing problems or questions before her next appointment.  1. Atypical Chest Pain:  Checking Troponin I and CK-MB. I will make patient's cardiologist aware as well.  2. COPD: Plan to repeat full pulmonary function testing  at follow-up appointment. Continuing Symbicort. Screening for alpha-1 antitrypsin deficiency. 3. Chronic Hypoxic Respiratory Failure: Prescribing patient oxygen at 2 L/m with exertion. Also prescribing portable concentrator. She will continue using oxygen at night at 2 L/m while sleeping.  4. Bilateral Lung Nodules:  Repeat CT chest without contrast July 2017. 5. GERD w/ Dysphagia: Continuing Zantac. No changes at this time. 6. Allergic rhinitis: Continuing Singulair. No changes at this time. 7. Immunizations: Status post influenza vaccine November 2016 & Prevnar September 2016. 8. Follow-up: Patient to return to clinic in 1-2 months.   Sonia Baller Ashok Cordia, M.D. [facility] Pager:  [phone] After 3pm or if no response, call [phone] 1:14 PM 10/14/2015

## 2015-10-14 NOTE — Patient Instructions (Signed)
   Continue taking your Symbicort, Singulair, and Zantac as prescribed.  Contact your cardiologist if you have any more chest discomfort or pain.  I will see you back in 4-8 weeks with a breathing test. Call me if you have any new breathing problems before then.   TESTS ORDERED: 1. Serum Troponin I, CK-MB, and Alpha-1 Antitrypsin Phenotype today 2. Full PFT at follow-up appointment

## 2015-10-22 LAB — FUNGUS CULTURE W SMEAR

## 2015-10-22 LAB — AFB CULTURE WITH SMEAR (NOT AT ARMC)

## 2015-10-30 ENCOUNTER — Other Ambulatory Visit: Payer: Self-pay | Admitting: Obstetrics and Gynecology

## 2015-10-31 ENCOUNTER — Ambulatory Visit
Admission: RE | Admit: 2015-10-31 | Discharge: 2015-10-31 | Disposition: A | Discharge status: Home / Self Care | Payer: Medicare Other | Source: Ambulatory Visit | Attending: Internal Medicine | Admitting: Internal Medicine

## 2015-10-31 ENCOUNTER — Encounter: Payer: Self-pay | Admitting: Internal Medicine

## 2015-10-31 DIAGNOSIS — Z1231 Encounter for screening mammogram for malignant neoplasm of breast: Secondary | ICD-10-CM | POA: Diagnosis not present

## 2015-10-31 DIAGNOSIS — E2839 Other primary ovarian failure: Secondary | ICD-10-CM

## 2015-10-31 DIAGNOSIS — M8589 Other specified disorders of bone density and structure, multiple sites: Secondary | ICD-10-CM | POA: Diagnosis not present

## 2015-10-31 DIAGNOSIS — Z780 Asymptomatic menopausal state: Secondary | ICD-10-CM | POA: Diagnosis not present

## 2015-10-31 LAB — HM DEXA SCAN

## 2015-10-31 NOTE — Addendum Note (Signed)
Addended by: [name] on: 10/31/2015 05:44 PM   Modules accepted: Miquel Dunn

## 2015-11-02 ENCOUNTER — Other Ambulatory Visit: Payer: Self-pay | Admitting: Internal Medicine

## 2015-11-03 ENCOUNTER — Ambulatory Visit (INDEPENDENT_AMBULATORY_CARE_PROVIDER_SITE_OTHER)
Admission: RE | Admit: 2015-11-03 | Discharge: 2015-11-03 | Disposition: A | Discharge status: Home / Self Care | Payer: Medicare Other | Source: Ambulatory Visit | Attending: Pulmonary Disease | Admitting: Pulmonary Disease

## 2015-11-03 DIAGNOSIS — J449 Chronic obstructive pulmonary disease, unspecified: Secondary | ICD-10-CM

## 2015-11-03 DIAGNOSIS — R918 Other nonspecific abnormal finding of lung field: Secondary | ICD-10-CM | POA: Diagnosis not present

## 2015-11-05 ENCOUNTER — Telehealth: Payer: Self-pay | Admitting: Pulmonary Disease

## 2015-11-05 DIAGNOSIS — R918 Other nonspecific abnormal finding of lung field: Secondary | ICD-10-CM

## 2015-11-05 LAB — HM MAMMOGRAPHY

## 2015-11-05 NOTE — Addendum Note (Signed)
Addended by: [name] on: 11/05/2015 09:01 PM   Modules accepted: Miquel Dunn

## 2015-11-05 NOTE — Telephone Encounter (Signed)
Result Note     Please let the patient know I reviewed her chest CT scan. There has been a small increase in the size of her right upper lobe nodule which is of unclear significance and could be related to the technique of CT scan imaging. To be safe we should perform a PET/CT scan to see if this nodule is hypermetabolic and could represent an early lung cancer. Thank you.  --  I spoke with patient about results and she verbalized understanding and had no questions. PET has been ordered.

## 2015-11-11 ENCOUNTER — Encounter (HOSPITAL_COMMUNITY)
Admission: RE | Admit: 2015-11-11 | Discharge: 2015-11-11 | Disposition: A | Discharge status: Home / Self Care | Payer: Medicare Other | Source: Ambulatory Visit | Attending: Pulmonary Disease | Admitting: Pulmonary Disease

## 2015-11-11 DIAGNOSIS — J449 Chronic obstructive pulmonary disease, unspecified: Secondary | ICD-10-CM | POA: Diagnosis not present

## 2015-11-11 DIAGNOSIS — Z85118 Personal history of other malignant neoplasm of bronchus and lung: Secondary | ICD-10-CM | POA: Diagnosis not present

## 2015-11-11 DIAGNOSIS — R918 Other nonspecific abnormal finding of lung field: Secondary | ICD-10-CM | POA: Diagnosis not present

## 2015-11-11 DIAGNOSIS — C3490 Malignant neoplasm of unspecified part of unspecified bronchus or lung: Secondary | ICD-10-CM | POA: Diagnosis not present

## 2015-11-11 DIAGNOSIS — R0602 Shortness of breath: Secondary | ICD-10-CM | POA: Diagnosis not present

## 2015-11-11 LAB — GLUCOSE, CAPILLARY: GLUCOSE-CAPILLARY: 107 mg/dL — AB (ref 65–99)

## 2015-11-11 MED ORDER — FLUDEOXYGLUCOSE F - 18 (FDG) INJECTION: 11.7000 | Freq: Once | INTRAVENOUS | Status: DC | PRN

## 2015-11-12 DIAGNOSIS — Z1211 Encounter for screening for malignant neoplasm of colon: Secondary | ICD-10-CM | POA: Diagnosis not present

## 2015-11-12 DIAGNOSIS — Z1212 Encounter for screening for malignant neoplasm of rectum: Secondary | ICD-10-CM | POA: Diagnosis not present

## 2015-11-13 ENCOUNTER — Telehealth: Payer: Self-pay | Admitting: Pulmonary Disease

## 2015-11-13 ENCOUNTER — Telehealth: Payer: Self-pay | Admitting: *Deleted

## 2015-11-13 DIAGNOSIS — R918 Other nonspecific abnormal finding of lung field: Secondary | ICD-10-CM

## 2015-11-13 DIAGNOSIS — L570 Actinic keratosis: Secondary | ICD-10-CM | POA: Diagnosis not present

## 2015-11-13 DIAGNOSIS — L578 Other skin changes due to chronic exposure to nonionizing radiation: Secondary | ICD-10-CM | POA: Diagnosis not present

## 2015-11-13 NOTE — Telephone Encounter (Signed)
-----   Message from [name], MD sent at 11/12/2015  5:57 PM EDT ----- Regarding: SuperD Chest CT Keyaan Lederman,  Mrs. Mihalko had a CT of her chest without contrast prior to her PET CT scan. Can you contact that imaging center and see if they still have the ability to convert it over to a CD and format so we can potentially do a navigational biopsy. I tried calling her to discuss this and will try again tomorrow. Thanks.

## 2015-11-13 NOTE — Telephone Encounter (Signed)
-----   Message from [name], MD sent at 11/12/2015 5:57 PM EDT ----- Regarding: SuperD Chest CT Brandy Cameron,  Mrs. [name] had a CT of her chest without contrast prior to her PET CT scan. Can you contact that imaging center and see if they still have the ability to convert it over to a CD and format so we can potentially do a navigational biopsy. I tried calling her to discuss this and will try again tomorrow. Thanks.   Called spoke with pt. She states that she received a call from [facility] about results. She is requesting a call back to review the information he discussed on the answering machine. I explained to her that I would send the message. She voiced understanding and had no further questions.   Will send message to Dr. Ashok Cordia as Juluis Rainier

## 2015-11-13 NOTE — Telephone Encounter (Signed)
Will forward to libby to be aware.

## 2015-11-13 NOTE — Telephone Encounter (Signed)
Attempted to call patient but no answer. Left a message to call with a better number and/or time to reach her to discuss her result.

## 2015-11-13 NOTE — Telephone Encounter (Signed)
Very good. Thanks.

## 2015-11-13 NOTE — Telephone Encounter (Signed)
Called stacy w/ LBCT and LMTCB x1

## 2015-11-13 NOTE — Telephone Encounter (Signed)
Stacy called and super D can be done and this will be sent to libby

## 2015-11-14 DIAGNOSIS — J449 Chronic obstructive pulmonary disease, unspecified: Secondary | ICD-10-CM | POA: Diagnosis not present

## 2015-11-14 DIAGNOSIS — Z85118 Personal history of other malignant neoplasm of bronchus and lung: Secondary | ICD-10-CM | POA: Diagnosis not present

## 2015-11-14 DIAGNOSIS — R0602 Shortness of breath: Secondary | ICD-10-CM | POA: Diagnosis not present

## 2015-11-14 NOTE — Telephone Encounter (Signed)
Called and spoke with pt. She states that she will be home all day today and requests a call back from [facility] today. I explained to her that I would send a message to JN to make him aware. She voiced understanding and had no further questions.   Will send message to [facility] as FYI

## 2015-11-14 NOTE — Telephone Encounter (Signed)
Spoke with patient regarding PET CT findings. She wants me to discuss it further with her family at her appointment on 8/4. In the meantime, she doesn't think she is breathing good enough to undergo general anesthesia for a navigational bronchoscopy and doesn't want to accept the risks of the procedure which is understandable. We need to refer to her to Radiation Oncology to be seen for SBRT to these hypermetabolic lesions. I did caution her that given her prior radiation exposure it will count towards her cumulative allowed dose and may affect how they treat or if they are able to treat. I also cautioned her that if this is indeed cancer it could have spread but we see no evidence of it now and may only find out down the road as it grows. Please put in a referral to Radiation Oncology for SBRT for these hypermetabolic nodules. Thanks.

## 2015-11-14 NOTE — Telephone Encounter (Signed)
[phone] sister called for the patient they will be home all day

## 2015-11-14 NOTE — Telephone Encounter (Signed)
Referral placed to [facility] for referral.

## 2015-11-19 ENCOUNTER — Telehealth: Payer: Self-pay | Admitting: Internal Medicine

## 2015-11-19 NOTE — Telephone Encounter (Signed)
No- I have not heard of this

## 2015-11-19 NOTE — Telephone Encounter (Signed)
Have you ever heard of this?

## 2015-11-19 NOTE — Telephone Encounter (Signed)
Patient states she would like a script for a carry tank that produces its own oxygen?  Please follow up in regard.

## 2015-11-20 NOTE — Telephone Encounter (Signed)
She needs to run this by her lung doctor

## 2015-11-20 NOTE — Telephone Encounter (Signed)
Niece called back states this is called a Inogen 1 (portable oxygen concentrator)

## 2015-11-21 ENCOUNTER — Telehealth: Payer: Self-pay | Admitting: Pulmonary Disease

## 2015-11-21 NOTE — Telephone Encounter (Signed)
Left message for patient

## 2015-11-21 NOTE — Telephone Encounter (Signed)
lmtcb x1 for pt. [name] is not listed on pt's DPR.

## 2015-11-23 ENCOUNTER — Other Ambulatory Visit: Payer: Self-pay | Admitting: Internal Medicine

## 2015-11-23 DIAGNOSIS — Z1211 Encounter for screening for malignant neoplasm of colon: Secondary | ICD-10-CM

## 2015-11-23 DIAGNOSIS — Z1212 Encounter for screening for malignant neoplasm of rectum: Principal | ICD-10-CM

## 2015-11-23 LAB — COLOGUARD: COLOGUARD: POSITIVE

## 2015-11-24 ENCOUNTER — Ambulatory Visit
Admission: RE | Admit: 2015-11-24 | Discharge: 2015-11-24 | Disposition: A | Discharge status: Home / Self Care | Payer: Medicare Other | Source: Ambulatory Visit | Attending: Radiation Oncology | Admitting: Radiation Oncology

## 2015-11-24 ENCOUNTER — Telehealth: Payer: Self-pay

## 2015-11-24 ENCOUNTER — Encounter: Payer: Self-pay | Admitting: Radiation Oncology

## 2015-11-24 VITALS — BP 145/69 | HR 85 | Temp 97.8°F | Resp 18 | Ht 61.0 in | Wt 219.1 lb

## 2015-11-24 DIAGNOSIS — Z8249 Family history of ischemic heart disease and other diseases of the circulatory system: Secondary | ICD-10-CM | POA: Insufficient documentation

## 2015-11-24 DIAGNOSIS — J45909 Unspecified asthma, uncomplicated: Secondary | ICD-10-CM | POA: Insufficient documentation

## 2015-11-24 DIAGNOSIS — I6523 Occlusion and stenosis of bilateral carotid arteries: Secondary | ICD-10-CM | POA: Insufficient documentation

## 2015-11-24 DIAGNOSIS — C3490 Malignant neoplasm of unspecified part of unspecified bronchus or lung: Secondary | ICD-10-CM | POA: Diagnosis not present

## 2015-11-24 DIAGNOSIS — Z87891 Personal history of nicotine dependence: Secondary | ICD-10-CM | POA: Insufficient documentation

## 2015-11-24 DIAGNOSIS — I2510 Atherosclerotic heart disease of native coronary artery without angina pectoris: Secondary | ICD-10-CM | POA: Insufficient documentation

## 2015-11-24 DIAGNOSIS — I252 Old myocardial infarction: Secondary | ICD-10-CM | POA: Insufficient documentation

## 2015-11-24 DIAGNOSIS — Z825 Family history of asthma and other chronic lower respiratory diseases: Secondary | ICD-10-CM | POA: Insufficient documentation

## 2015-11-24 DIAGNOSIS — Z79899 Other long term (current) drug therapy: Secondary | ICD-10-CM | POA: Diagnosis not present

## 2015-11-24 DIAGNOSIS — Z9889 Other specified postprocedural states: Secondary | ICD-10-CM | POA: Insufficient documentation

## 2015-11-24 DIAGNOSIS — Z923 Personal history of irradiation: Secondary | ICD-10-CM | POA: Insufficient documentation

## 2015-11-24 DIAGNOSIS — Z808 Family history of malignant neoplasm of other organs or systems: Secondary | ICD-10-CM | POA: Diagnosis not present

## 2015-11-24 DIAGNOSIS — J449 Chronic obstructive pulmonary disease, unspecified: Secondary | ICD-10-CM | POA: Insufficient documentation

## 2015-11-24 DIAGNOSIS — I10 Essential (primary) hypertension: Secondary | ICD-10-CM | POA: Insufficient documentation

## 2015-11-24 DIAGNOSIS — Z888 Allergy status to other drugs, medicaments and biological substances status: Secondary | ICD-10-CM | POA: Diagnosis not present

## 2015-11-24 DIAGNOSIS — Z85118 Personal history of other malignant neoplasm of bronchus and lung: Secondary | ICD-10-CM

## 2015-11-24 DIAGNOSIS — Z7982 Long term (current) use of aspirin: Secondary | ICD-10-CM | POA: Diagnosis not present

## 2015-11-24 NOTE — Telephone Encounter (Signed)
Please advise fax came in reporting patient tested positive on cologuard screening.

## 2015-11-24 NOTE — Telephone Encounter (Signed)
Referred to GI

## 2015-11-24 NOTE — Telephone Encounter (Signed)
Pt returning call and can be reached @ [phone].Brandy Cameron

## 2015-11-24 NOTE — Telephone Encounter (Signed)
Spoke with pt. She is not needing a POC at this time. Nothing further was needed.

## 2015-11-24 NOTE — Telephone Encounter (Signed)
Left message for patient to give [place] a call back.

## 2015-11-24 NOTE — Progress Notes (Signed)
See progress note under physician encounter. 

## 2015-11-24 NOTE — Progress Notes (Signed)
Thoracic Location of Tumor / Histology: invasive adenocarcinoma of left upper lobe lung s/p wedge resection and brachytherapy. Now with new onset 4.7 x1.5 cm pathy opacity medially in the left upper lobe and 16 x9 mm spiculated right apical nodule  Patient diagnosed in 2010.  LEFT LUNG, UPPER LOBE, NEEDLE CORE BIOPSY: ADENOCARCINOMA, SEE COMMENT  COMMENT The tumor has bronchioloalveolar features. Clinical correlation is recommended. Dr. Lyndon Code reviewed this case and concurs. (BNS:kv 01-10-09)  Tobacco/Marijuana/Snuff/ETOH use: former smoker, quit in 2008  Past/Anticipated interventions by cardiothoracic surgery, if any: yes, wedge resection  Past/Anticipated interventions by medical oncology, if any:  PAST THERAPY: Status post wedge resection of the left upper lobe on 02/10/2009 and adjuvant radiation therapy with brachytherapy.  CURRENT THERAPY: Watchful observation  Signs/Symptoms  Weight changes, if any: no  Respiratory complaints, if any: exertional dyspnea, intermittent productive cough with white to yellow sputum cough, wheezing, and chest tightness  Hemoptysis, if any: no  Pain issues, if any:  Chest tightness  SAFETY ISSUES:  Prior radiation? yes  Pacemaker/ICD? no   Possible current pregnancy?no  Is the patient on methotrexate? no  Current Complaints / other details:  80 year old female. Continuous oxygen therapy 2 liters via nasal cannula noted. Reports she has been ordered to perform breathing treatments bid but, didn't do her treatment this morning. Reports she is unable to lay flat on her back and breath. Explains she must lay on her right side to sleep. Generalized edema noted. Reports she is exhausted. Reports occasional dizziness with standing. Reports hot flashes. Denies nausea or vomiting. Reports occasional headaches. Reports nervous tremors. Reports taking Mucinex. Reports she stopped taking Singulair and Zantac because she believed these medications  made her breathing worse.

## 2015-11-24 NOTE — Progress Notes (Signed)
[place]         [phone] ________________________________  Initial Outpatient Consultation  Name: Brandy Cameron MRN: [id number]  Date: 11/24/2015  DOB: 1935-11-23  REFERRING PHYSICIAN: [name], MD  DIAGNOSIS: 80 y.o. 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 Malignant neoplasm of lung, unspecified laterality, unspecified part of lung ([place]) 162.9 C34.90     HISTORY OF PRESENT ILLNESS::Brandy Cameron is a 80 y.o. female who has a history of pT1a, pN0, pMX invasive adenocarcinoma of the LUL status post wedge resection on 02/10/2009. The tumor measured 2.0 cm, negative margins. Lymph nodes biopsied (4L and 10L) were negative for malignancy. She was also treated with adjuvant radiation with brachytherapy.  CT scan of the chest on 11/03/15 noted post radiation seed implants in the LUL with adjacent scarring, scattered benign-appearing mediastinal lymph nodes, a 1.2 cm nodule in the RUL that has increased in size, and a stable 0.6 cm nodule in the RLL.  PET scan on 11/11/15 showed a LUL wedge resection with brachytherapy seed placement anteriorly with associated mild hypermetabolism (SUV 4.8) that is likely reactive, a 4.7 x 1.5 cm patchy opacity medially in the LUL (SUV max 11.8) worrisome for a recurrent tumor, a 1.6 x 0.9 cm spiculated right apical nodule (SUV max 2.8) suspicious for either a primary bronchogenic neoplasm or metastasis, and a stable 0.6 cm RLL nodule beneath the size threshold for PET sensitivity.  The patient presents today to discuss the role of radiation in the management of her disease.  PREVIOUS RADIATION THERAPY: Yes. Status post wedge resection of the left upper lobe on 02/10/2009 and adjuvant radiation therapy with brachytherapy.  Past Medical History:  Diagnosis Date  . Asthma   . CAD (coronary artery disease)    a. LHC  9/11 (in setting of NSTEMI): LAD irregs, mLCx 50, pRCA 50, inf-apical AK;  b. Myoview 9/13: Normal stress nuclear study.  LV Ejection Fraction: 71%   . Carotid stenosis    a. Carotid US 9/13: bilat 0-39%;  b. Carotid US 7/15: no sig stenosis  . Chronic diastolic CHF (congestive heart failure) ([place])    a. Echo 7/15: Mild LVH, mild focal basal septal hypertrophy, EF 50-55%, no RWMA, Gr 1 DD  . COPD (chronic obstructive pulmonary disease) ([place])   . HTN (hypertension)   . Lung cancer [facility])    [name]  Marland Kitchen NSTEMI (non-ST elevated myocardial infarction) ([place])    with only mild to moderate CAD in September of 2011,There was concern for apival ballooning. >> ? Tako-Tsubo syndrome vs vasospasm  :   Past Surgical History:  Procedure Laterality Date  . CATARACT SURGERY    . FOOT SURGERY    . GALLBLADDER SURGERY    . KNEE SURGERY    . LUNG LOBECTOMY    . VESICOVAGINAL FISTULA CLOSURE W/ TAH    :   Current Outpatient Prescriptions:  .  albuterol (PROVENTIL) (2.5 MG/3ML) 0.083% nebulizer solution, , Disp: , Rfl:  .  aspirin 81 MG tablet, Take 81 mg by mouth daily.  , Disp: , Rfl:  .  atorvastatin (LIPITOR) 20 MG tablet, Take 20 mg by mouth daily., Disp: , Rfl: 3 .  budesonide-formoterol (SYMBICORT) 160-4.5 MCG/ACT inhaler, Inhale 2 puffs into the lungs 2 (two) times daily.,  Disp: , Rfl:  .  calcium carbonate 1250 MG capsule, Take 1,250 mg by mouth 2 (two) times daily with a meal., Disp: , Rfl:  .  cromolyn (OPTICROM) 4 % ophthalmic solution, INT 1 GTT INTO OU QID, Disp: , Rfl: 12 .  dextromethorphan-guaiFENesin (MUCINEX DM) 30-600 MG 12hr tablet, Take 1 tablet by mouth 2 (two) times daily., Disp: , Rfl:  .  furosemide (LASIX) 40 MG tablet, TAKE 1 TABLET BY MOUTH DAILY, Disp: 30 tablet, Rfl: 1 .  losartan (COZAAR) 100 MG tablet, Take 1 tablet (100 mg total) by mouth daily., Disp: 30 tablet, Rfl: 12 .  metoprolol  tartrate (LOPRESSOR) 25 MG tablet, TAKE 1 TABLET BY MOUTH TWICE DAILY, Disp: 60 tablet, Rfl: 1 .  nitroGLYCERIN (NITROSTAT) 0.4 MG SL tablet, Place 1 tablet (0.4 mg total) under the tongue every 5 (five) minutes as needed for chest pain., Disp: 25 tablet, Rfl: 12 .  OMEGA-3 FATTY ACIDS PO, Take by mouth daily., Disp: , Rfl:  .  OXYGEN, Inhale into the lungs. 2 liters at nights, Disp: , Rfl:  .  potassium chloride SA (K-DUR,KLOR-CON) 20 MEQ tablet, Take 20 mEq by mouth daily. , Disp: , Rfl:  .  Spacer/Aero-Holding Chambers (AEROCHAMBER MV) inhaler, Use as instructed, Disp: 1 each, Rfl: 0 .  Cholecalciferol (VITAMIN D3) 1000 units CAPS, Take 1,000 Units by mouth daily., Disp: , Rfl:  .  Coenzyme Q10 (CO Q-10) 100 MG CAPS, Take 1 tablet by mouth daily., Disp: , Rfl: :   Allergies  Allergen Reactions  . Sulfa Antibiotics Rash and Shortness Of Breath  . Amlodipine   . Pantoprazole Nausea And Vomiting  . Sulfonamide Derivatives Rash  :   Family History  Problem Relation Age of Onset  . Heart disease Mother 49  . Asthma Mother   . Diabetes Mother   . Cancer Mother     Salivary  . Emphysema Mother   . Heart disease      POSITIVE FAMILY HX. OF   . Pancreatitis      QUESTIONABLE FROM ACE INHIBITORS  . COPD Sister   :   Social History   Social History  . Marital status: Divorced    Spouse name: N/A  . Number of children: N/A  . Years of education: N/A   Occupational History  . Not on file.   Social History Main Topics  . Smoking status: Former Smoker    Packs/day: 1.50    Years: 42.00    Types: Cigarettes    Quit date: 04/26/2006  . Smokeless tobacco: Never Used  . Alcohol use No  . Drug use: No  . Sexual activity: No   Other Topics Concern  . Not on file   Social History Narrative   Originally from [place]. Always lived in [place]. Prior travel to [facility]. 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 Has a dog at home. No birds, mold, or hot tub  exposure. No indoor plants. No carpet in her home. Does have draperies.   :  REVIEW OF SYSTEMS:  A 15 point review of systems is documented in the electronic medical record. This was obtained by the nursing staff. However, I reviewed this with the patient to discuss relevant findings and make appropriate changes.  Reports she is unable to lay flat on her back and breath and that she must lay on her right side to sleep. Generalized edema noted by the nurse. Reports she is exhausted. Reports  occasional dizziness with standing. Reports hot flashes. Denies nausea or vomiting. Reports occasional headaches.  Reports nervous tremors. Reports taking Mucinex. Reports she stopped taking Singulair and Zantac because she believed these medications made her breathing worse.   PHYSICAL EXAM:  Blood pressure (!) 145/69, pulse 85, temperature 97.8 F (36.6 C), temperature source Oral, resp. rate 18, height '5\' 1"'$  (1.549 m), weight 219 lb 1.6 oz (99.4 kg), SpO2 95 %. 2 L of oxygen via nasal cannula. Respiratory unremarkable.  KPS = 100  100 - Normal; no complaints; no evidence of disease. 90   - Able to carry on normal activity; minor signs or symptoms of disease. 80   - Normal activity with effort; some signs or symptoms of disease. 95   - Cares for self; unable to carry on normal activity or to do active work. 60   - Requires occasional assistance, but is able to care for most of his personal needs. 50   - Requires considerable assistance and frequent medical care. 26   - Disabled; requires special care and assistance. 3   - Severely disabled; hospital admission is indicated although death not imminent. 59   - Very sick; hospital admission necessary; active supportive treatment necessary. 10   - Moribund; fatal processes progressing rapidly. 0     - Dead  Karnofsky DA, Abelmann [place], Craver LS and Burchenal JH [phone]) The use of the nitrogen mustards in the palliative treatment of carcinoma: with particular  reference to bronchogenic carcinoma Cancer 1 634-56  LABORATORY DATA:  Lab Results  Component Value Date   WBC 12.3 (H) 10/07/2015   HGB 14.4 10/07/2015   HCT 44.3 10/07/2015   MCV 86.7 10/07/2015   PLT 309.0 10/07/2015   Lab Results  Component Value Date   NA 145 10/07/2015   K 4.1 10/07/2015   CL 103 10/07/2015   CO2 34 (H) 10/07/2015   Lab Results  Component Value Date   ALT 18 10/07/2015   AST 14 10/07/2015   ALKPHOS 126 (H) 10/07/2015   BILITOT 0.3 10/07/2015     RADIOGRAPHY: Ct Chest Wo Contrast  Result Date: 11/03/2015 CLINICAL DATA:  COPD. Chronic shortness of breath with exertion. Follow-up lung nodules. EXAM: CT CHEST WITHOUT CONTRAST TECHNIQUE: Multidetector CT imaging of the chest was performed following the standard protocol without IV contrast. COMPARISON:  CT 12/15/2011 FINDINGS: Cardiovascular: Heart is normal size. Aorta is normal caliber. Coronary artery and aortic calcifications. Mediastinum/Nodes: Small scattered benign-appearing mediastinal lymph nodes. No mediastinal, hilar, or axillary adenopathy. Lungs/Pleura: 12 mm nodule in the right upper lobe is increased in size since prior study. 6 mm nodule in the right lower lobe on image 93 is stable. Postradiation seed implant and adjacent scarring in the left upper lobe anteriorly, stable. No pleural effusions. Upper Abdomen: Imaging into the upper abdomen shows no acute findings. Musculoskeletal: No acute bony abnormality or focal bone lesion. IMPRESSION: Post radiation seed implants in the left upper lobe with adjacent scarring. This is stable since prior study. Enlarging right apical nodule, now 12 mm. Cannot exclude slowly enlarging cancer. This could be further evaluated with PET CT. Stable right lower lobe pulmonary nodule compatible with a benign process. Coronary artery disease.  Aortic atherosclerosis. Electronically Signed   By: Rolm Baptise M.D.   On: 11/03/2015 10:12   Nm Pet Image Restag (ps) Skull Base To  Thigh  Result Date: 11/11/2015 CLINICAL DATA:  Subsequent treatment strategy for lung cancer status post radiation, now with enlarging right  apical nodule. EXAM: NUCLEAR MEDICINE PET SKULL BASE TO THIGH TECHNIQUE: 11.7 mCi F-18 FDG was injected intravenously. Full-ring PET imaging was performed from the skull base to thigh after the radiotracer. CT data was obtained and used for attenuation correction and anatomic localization. FASTING BLOOD GLUCOSE:  Value: 107 mg/dl COMPARISON:  CT chest dated 10/31/2015, 08/04/2015, and 12/15/2011. CT abdomen pelvis dated 08/29/2013. FINDINGS: NECK No hypermetabolic lymph nodes in the neck. CHEST Left upper lobe wedge resection with brachytherapy seed placement anteriorly. Associated mild hypermetabolism, max SUV 4.8, likely reactive. Additional patchy opacity medially in the left upper lobe, measuring 4.7 x 1.5 cm (series 7/ image 17), max SUV 11.8. This previously measured 4.6 x 1.1 cm on 08/04/2015 and is new from 2013. This appearance is considered worrisome for recurrent tumor. If this patient had external beam radiation within the past 6-12 months, paramediastinal radiation changes is also possible, but that history has not been provided. 16 x 9 mm spiculated right apical nodule (series 7/ image 9), max SUV 2.8. This previously measured 16 x 8 mm in 08/04/2015 but is essentially new when compared to 2013. As such, this is considered suspicious for primary bronchogenic neoplasm or metastasis. Stable 6 mm right lower lobe nodule (series 7/ image 43), beneath the size threshold for PET sensitivity, but likely benign given long-term stability. No pleural effusion or pneumothorax. No hypermetabolic thoracic lymphadenopathy. ABDOMEN/PELVIS No abnormal hypermetabolic activity within the liver, pancreas, adrenal glands, or spleen. Tiny hiatal hernia. Hepatic steatosis. Status post cholecystectomy. Atherosclerotic calcifications the abdominal aorta and branch vessels. Colonic  diverticulosis, without evidence of diverticulitis. No hypermetabolic lymph nodes in the abdomen or pelvis. SKELETON No focal hypermetabolic activity to suggest skeletal metastasis. IMPRESSION: Status post left upper lobe wedge resection with brachytherapy seed placement anteriorly. 4.7 x 1.5 cm patchy opacity medially in the left upper lobe, new from 2013, worrisome for recurrent tumor. See comments above. 16 x 9 mm spiculated right apical nodule, new from 2013, suspicious for primary bronchogenic neoplasm or metastasis. Electronically Signed   By: Julian Hy M.D.   On: 11/11/2015 17:08   Dg Bone Density  Result Date: 10/31/2015 EXAM: DUAL X-RAY ABSORPTIOMETRY (DXA) FOR BONE MINERAL DENSITY IMPRESSION: Referring Physician:  [name] PATIENT: Name: Taiylor, Virden Patient ID: [id number] Birth Date: [birth year] Height: 61.5 in. Sex: Female Measured: 10/31/2015 Weight: 214.0 lbs. Indications: Advanced Age, Caucasian, Estrogen Deficient, Family History of Osteoporosis, Height Loss (781.91), History of Fracture (Adult) (V15.51), Hysterectomy, Low Calcium Intake (269.3), Postmenopausal, Secondary Osteoporosis, zantac Fractures: Foot, nose Treatments: Calcium (E943.0), Vitamin D (E933.5) ASSESSMENT: The BMD measured at Femur Neck Left is 0.703 g/cm2 with a T-score of -2.4. This patient is considered osteopenic according to [facility]) criteria. Site Region Measured Date Measured Age YA BMD Significant CHANGE T-score DualFemur Neck Left 10/31/2015    [age]         -2.4    0.703 g/cm2 AP Spine  L1-L4     10/31/2015    [age]         -1.6    1.000 g/cm2 World Health Organization [facility]) criteria for post-menopausal, Caucasian Women: Normal       T-score at or above -1 SD Osteopenia   T-score between -1 and -2.5 SD Osteoporosis T-score at or below -2.5 SD RECOMMENDATION: [place] recommends that FDA-approved medical therapies be considered in postmenopausal women and men  age 72 or older with a: 1. Hip or vertebral (clinical or morphometric) fracture. 2. T-score  of <-2.5 at the spine or hip. 3. Ten-year fracture probability by FRAX of 3% or greater for hip fracture or 20% or greater for major osteoporotic fracture. All treatment decisions require clinical judgment and consideration of individual patient factors, including patient preferences, co-morbidities, previous drug use, risk factors not captured in the FRAX model (e.g. falls, vitamin D deficiency, increased bone turnover, interval significant decline in bone density) and possible under - or over-estimation of fracture risk by FRAX. All patients should ensure an adequate intake of dietary calcium (1200 mg/d) and vitamin D (800 IU daily) unless contraindicated. FOLLOW-UP: People with diagnosed cases of osteoporosis or at high risk for fracture should have regular bone mineral density tests. For patients eligible for Medicare, routine testing is allowed once every 2 years. The testing frequency can be increased to one year for patients who have rapidly progressing disease, those who are receiving or discontinuing medical therapy to restore bone mass, or have additional risk factors. I have reviewed this report, and agree with the above findings. [facility] FRAX* 10-year Probability of Fracture Based on femoral neck BMD: DualFemur (Left) Major Osteoporotic Fracture: 22.9% Hip Fracture:                6.6% Population:                  [place] (Caucasian) Risk Factors: History of Fracture (Adult) (V15.51), Secondary Osteoporosis *FRAX is a Materials engineer of the [employer] of [employer] for Metabolic Bone Disease, a World Pharmacologist (WHO) [facility]. ASSESSMENT: The probability of a major osteoporotic fracture is 22.9% withinthe next ten years. The probability of a hip fracture is 6.6% within the next ten tears. Electronically Signed   By: Rolm Baptise M.D.   On: 10/31/2015 16:10   Mm  Digital Screening Bilateral  Result Date: 11/05/2015 CLINICAL DATA:  Screening. EXAM: DIGITAL SCREENING BILATERAL MAMMOGRAM WITH CAD COMPARISON:  None. ACR Breast Density Category b: There are scattered areas of fibroglandular density. FINDINGS: There are no findings suspicious for malignancy. Images were processed with CAD. IMPRESSION: No mammographic evidence of malignancy. A result letter of this screening mammogram will be mailed directly to the patient. RECOMMENDATION: Screening mammogram in one year. (Code:SM-B-01Y) BI-RADS CATEGORY  1: Negative. Electronically Signed   By: Lajean Manes M.D.   On: 11/05/2015 13:38      IMPRESSION: 80 y.o. woman with possible recurrent non small cell carcinoma of the left upper lung with synchronous right upper lung cancer  PLAN: The patient is scheduled to have a Pulmonary Function Test on 11/28/15. Before then, we will have the patient's case discussed in lung clinic on 11/27/15.  I spent time face to face with the patient and more than 50% of that time was spent in counseling and/or coordination of care.   ------------------------------------------------   Tyler Pita, MD [place] Director and Director of Stereotactic Radiosurgery Direct Dial: [phone]  Fax: [phone] [email or web address]  Skype  [email or web address]  This document serves as a record of services personally performed by Tyler Pita, MD. It was created on his behalf by Darcus Austin, a trained medical scribe. The creation of this record is based on the scribe's personal observations and the provider's statements to them. This document has been checked and approved by the attending provider.

## 2015-11-24 NOTE — Telephone Encounter (Signed)
lmtcb x2 for pt. 

## 2015-11-25 ENCOUNTER — Telehealth: Payer: Self-pay | Admitting: *Deleted

## 2015-11-25 NOTE — Telephone Encounter (Signed)
Patient is aware 

## 2015-11-25 NOTE — Telephone Encounter (Signed)
Oncology Nurse Navigator Documentation  Oncology Nurse Navigator Flowsheets 11/25/2015  Navigator Encounter Type Introductory phone call;Telephone  Telephone Outgoing Call  Treatment Phase Pre-Tx/Tx Discussion  Barriers/Navigation Needs Coordination of Care  Interventions Coordination of Care  Coordination of Care Appts  Acuity Level 1  Acuity Level 1 Initial guidance, education and coordination as needed  Time Spent with Patient 15   I received referral from Dr. Tammi Klippel yesterday.  He requested patient be seen at [facility] on 11/27/15.  Dr. Julien Nordmann updated and will see at 4:15 on 11/27/15.    I called patient and spoke with her regarding appt.  She verbalized understanding of appt time and place.

## 2015-11-26 ENCOUNTER — Telehealth: Payer: Self-pay | Admitting: *Deleted

## 2015-11-26 NOTE — Telephone Encounter (Signed)
Called pt and confirmed 11/27/15 clinic appt w/ her.

## 2015-11-27 ENCOUNTER — Encounter: Payer: Self-pay | Admitting: Internal Medicine

## 2015-11-27 ENCOUNTER — Ambulatory Visit: Payer: Medicare Other | Admitting: Physical Therapy

## 2015-11-27 ENCOUNTER — Ambulatory Visit (HOSPITAL_BASED_OUTPATIENT_CLINIC_OR_DEPARTMENT_OTHER): Payer: Medicare Other | Admitting: Internal Medicine

## 2015-11-27 VITALS — BP 130/57 | HR 85 | Temp 98.0°F | Resp 17 | Ht 61.0 in | Wt 216.0 lb

## 2015-11-27 DIAGNOSIS — R918 Other nonspecific abnormal finding of lung field: Secondary | ICD-10-CM | POA: Diagnosis not present

## 2015-11-27 DIAGNOSIS — Z85118 Personal history of other malignant neoplasm of bronchus and lung: Secondary | ICD-10-CM | POA: Diagnosis not present

## 2015-11-27 DIAGNOSIS — J449 Chronic obstructive pulmonary disease, unspecified: Secondary | ICD-10-CM

## 2015-11-27 DIAGNOSIS — Z808 Family history of malignant neoplasm of other organs or systems: Secondary | ICD-10-CM | POA: Diagnosis not present

## 2015-11-27 DIAGNOSIS — C3412 Malignant neoplasm of upper lobe, left bronchus or lung: Secondary | ICD-10-CM

## 2015-11-27 NOTE — Progress Notes (Signed)
[place] [phone]   [phone] Multidisciplinary thoracic oncology clinic  CONSULT NOTE  REFERRING PHYSICIAN: Dr. Tera Partridge  REASON FOR CONSULTATION:  80 years old white female with questionable recurrent lung cancer.  HPI Brandy Cameron is a 80 y.o. female was past medical history significant for coronary are disease, hypertension, COPD, GERD as well as dyslipidemia and long history of smoking but quit in 2008. The patient mentions that in October 2010 she was diagnosed with a stage IA (T1a, N0, MX) non-small cell lung cancer, adenocarcinoma status post left upper lobe wedge resection as well as adjuvant radiotherapy with brachytherapy by Dr. Servando Snare and Dr. Tammi Klippel. She was followed by observation since that time but recently she has been complaining of shortness of breath. She was seen by Dr. Ashok Cordia and CT scan of the chest without contrast was performed on 11/03/2015 and it showed postradiation seed implants in the left upper lobe with adjacent to scarring stable since the prior study. There was enlarging right apical nodule now measuring 1.2 cm suspicious for enlarging cancer. There was also stable right lower lobe pulmonary nodule compatible with a benign process. A PET scan was performed on 11/11/2015 and it showed Left upper lobe wedge resection with brachytherapy seed placement anteriorly. Associated mild hypermetabolism, max SUV 4.8, likely reactive. Additional patchy opacity medially in the left upper lobe, measuring 4.7 x 1.5 cm, max SUV 11.8. This previously measured 4.6 x 1.1 cm on 08/04/2015 and is new from 2013. This appearance is considered worrisome for recurrent tumor. 16 x 9 mm spiculated right apical nodule, max SUV 2.8. This previously measured 16 x 8 mm in 08/04/2015 but is essentially new when compared to 2013. As such, this is considered suspicious for primary bronchogenic neoplasm or metastasis. Stable 6 mm right lower  lobe nodule, beneath the size threshold for PET sensitivity, but likely benign given long-term stability. The patient is very high risk for repeat biopsy because of the poor pulmonary function. Dr. Ashok Cordia kindly referred the patient to the multidisciplinary thoracic oncology clinic today for further evaluation and recommendation regarding treatment of her condition. When seen today the patient is feeling fine except for the baseline shortness of breath and she is currently on home oxygen. She has occasional chest pain relieved by nitroglycerin and she is followed by Dr. Stanford Breed from cardiology. She denied having any significant weight loss or night sweats. She has no nausea, vomiting, diarrhea or constipation. She denied having any significant hemoptysis. Family history significant for mother with salivary gland carcinoma. Father died in an accident. Son died at age 20 with a heart attack. The patient is single and has 3 children, one deceased. She was accompanied by her sister and niece.. She used to work in [employer] She has a history of smoking up to 2 packs per day for 42 years and quit in 2008. She has no history of alcohol or drug abuse.   HPI  Past Medical History:  Diagnosis Date  . Asthma   . CAD (coronary artery disease)    a. LHC 9/11 (in setting of NSTEMI): LAD irregs, mLCx 50, pRCA 50, inf-apical AK;  b. Myoview 9/13: Normal stress nuclear study.  LV Ejection Fraction: 71%   . Carotid stenosis    a. Carotid US 9/13: bilat 0-39%;  b. Carotid US 7/15: no sig stenosis  . Chronic diastolic CHF (congestive heart failure) ([place])    a. Echo 7/15: Mild LVH, mild focal basal septal  hypertrophy, EF 50-55%, no RWMA, Gr 1 DD  . COPD (chronic obstructive pulmonary disease) ([place])   . HTN (hypertension)   . Lung cancer [facility])    [name]  Marland Kitchen NSTEMI (non-ST elevated myocardial infarction) ([place])    with only  mild to moderate CAD in September of 2011,There was concern for apival ballooning. >> ? Tako-Tsubo syndrome vs vasospasm    Past Surgical History:  Procedure Laterality Date  . CATARACT SURGERY    . FOOT SURGERY    . GALLBLADDER SURGERY    . KNEE SURGERY    . LUNG LOBECTOMY    . VESICOVAGINAL FISTULA CLOSURE W/ TAH      Family History  Problem Relation Age of Onset  . Heart disease Mother 66  . Asthma Mother   . Diabetes Mother   . Cancer Mother     Salivary  . Emphysema Mother   . Heart disease      POSITIVE FAMILY HX. OF   . Pancreatitis      QUESTIONABLE FROM ACE INHIBITORS  . COPD Sister     Social History Social History  Substance Use Topics  . Smoking status: Former Smoker    Packs/day: 1.50    Years: 42.00    Types: Cigarettes    Quit date: 04/26/2006  . Smokeless tobacco: Never Used  . Alcohol use No    Allergies  Allergen Reactions  . Sulfa Antibiotics Rash and Shortness Of Breath  . Amlodipine   . Pantoprazole Nausea And Vomiting  . Sulfonamide Derivatives Rash    Current Outpatient Prescriptions  Medication Sig Dispense Refill  . albuterol (PROVENTIL) (2.5 MG/3ML) 0.083% nebulizer solution     . aspirin 81 MG tablet Take 81 mg by mouth daily.      Marland Kitchen atorvastatin (LIPITOR) 20 MG tablet Take 20 mg by mouth daily.  3  . budesonide-formoterol (SYMBICORT) 160-4.5 MCG/ACT inhaler Inhale 2 puffs into the lungs 2 (two) times daily.    . calcium carbonate 1250 MG capsule Take 1,250 mg by mouth 2 (two) times daily with a meal.    . Coenzyme Q10 (CO Q-10) 100 MG CAPS Take 1 tablet by mouth daily.    . cromolyn (OPTICROM) 4 % ophthalmic solution INT 1 GTT INTO OU QID  12  . dextromethorphan-guaiFENesin (MUCINEX DM) 30-600 MG 12hr tablet Take 1 tablet by mouth 2 (two) times daily.    . furosemide (LASIX) 40 MG tablet TAKE 1 TABLET BY MOUTH DAILY 30 tablet 1  . losartan (COZAAR) 100 MG tablet Take 1 tablet (100 mg total) by mouth daily. 30 tablet 12  .  metoprolol tartrate (LOPRESSOR) 25 MG tablet TAKE 1 TABLET BY MOUTH TWICE DAILY 60 tablet 1  . montelukast (SINGULAIR) 10 MG tablet Take 10 mg by mouth at bedtime.    . OMEGA-3 FATTY ACIDS PO Take by mouth daily.    . OXYGEN Inhale into the lungs. 2 liters at nights    . potassium chloride SA (K-DUR,KLOR-CON) 20 MEQ tablet Take 20 mEq by mouth daily.     . ranitidine (ZANTAC) 150 MG tablet Take 150 mg by mouth at bedtime.    Marland Kitchen Spacer/Aero-Holding Chambers (AEROCHAMBER MV) inhaler Use as instructed 1 each 0  . Cholecalciferol (VITAMIN D3) 1000 units CAPS Take 1,000 Units by mouth daily.    . nitroGLYCERIN (NITROSTAT) 0.4 MG SL tablet Place 1 tablet (0.4 mg total)  under the tongue every 5 (five) minutes as needed for chest pain. (Patient not taking: Reported on 11/27/2015) 25 tablet 12   No current facility-administered medications for this visit.     Review of Systems  Constitutional: positive for fatigue Eyes: negative Ears, nose, mouth, throat, and face: negative Respiratory: positive for cough, dyspnea on exertion and wheezing Cardiovascular: negative Gastrointestinal: negative Genitourinary:negative Integument/breast: negative Hematologic/lymphatic: negative Musculoskeletal:negative Neurological: negative Behavioral/Psych: negative Endocrine: negative Allergic/Immunologic: negative  Physical Exam  OIN:OMVEH, healthy, no distress, well nourished and well developed SKIN: skin color, texture, turgor are normal, no rashes or significant lesions HEAD: Normocephalic, No masses, lesions, tenderness or abnormalities EYES: normal, PERRLA, Conjunctiva are pink and non-injected EARS: External ears normal, Canals clear OROPHARYNX:no exudate, no erythema and lips, buccal mucosa, and tongue normal  NECK: supple, no adenopathy, no JVD LYMPH:  no palpable lymphadenopathy, no hepatosplenomegaly BREAST:not examined LUNGS: expiratory wheezes bilaterally HEART: regular rate & rhythm, no  murmurs and no gallops ABDOMEN:abdomen soft, non-tender, obese, normal bowel sounds and no masses or organomegaly BACK: Back symmetric, no curvature., No CVA tenderness EXTREMITIES:no joint deformities, effusion, or inflammation, no edema, no skin discoloration  NEURO: alert & oriented x 3 with fluent speech, no focal motor/sensory deficits  PERFORMANCE STATUS: ECOG 1  LABORATORY DATA: Lab Results  Component Value Date   WBC 12.3 (H) 10/07/2015   HGB 14.4 10/07/2015   HCT 44.3 10/07/2015   MCV 86.7 10/07/2015   PLT 309.0 10/07/2015      Chemistry      Component Value Date/Time   NA 145 10/07/2015 0940   NA 142 12/15/2011 0920   K 4.1 10/07/2015 0940   K 4.7 12/15/2011 0920   CL 103 10/07/2015 0940   CL 101 12/15/2011 0920   CO2 34 (H) 10/07/2015 0940   CO2 32 12/15/2011 0920   BUN 21 10/07/2015 0940   BUN 14 12/15/2011 0920   CREATININE 0.74 10/07/2015 0940   CREATININE 0.71 06/20/2015 1441      Component Value Date/Time   CALCIUM 9.5 10/07/2015 0940   CALCIUM 9.4 12/15/2011 0920   ALKPHOS 126 (H) 10/07/2015 0940   ALKPHOS 124 (H) 12/15/2011 0920   AST 14 10/07/2015 0940   AST 23 12/15/2011 0920   ALT 18 10/07/2015 0940   ALT 35 12/15/2011 0920   BILITOT 0.3 10/07/2015 0940   BILITOT 0.70 12/15/2011 0920       RADIOGRAPHIC STUDIES: Ct Chest Wo Contrast  Result Date: 11/03/2015 CLINICAL DATA:  COPD. Chronic shortness of breath with exertion. Follow-up lung nodules. EXAM: CT CHEST WITHOUT CONTRAST TECHNIQUE: Multidetector CT imaging of the chest was performed following the standard protocol without IV contrast. COMPARISON:  CT 12/15/2011 FINDINGS: Cardiovascular: Heart is normal size. Aorta is normal caliber. Coronary artery and aortic calcifications. Mediastinum/Nodes: Small scattered benign-appearing mediastinal lymph nodes. No mediastinal, hilar, or axillary adenopathy. Lungs/Pleura: 12 mm nodule in the right upper lobe is increased in size since prior study. 6 mm  nodule in the right lower lobe on image 93 is stable. Postradiation seed implant and adjacent scarring in the left upper lobe anteriorly, stable. No pleural effusions. Upper Abdomen: Imaging into the upper abdomen shows no acute findings. Musculoskeletal: No acute bony abnormality or focal bone lesion. IMPRESSION: Post radiation seed implants in the left upper lobe with adjacent scarring. This is stable since prior study. Enlarging right apical nodule, now 12 mm. Cannot exclude slowly enlarging cancer. This could be further evaluated with PET CT. Stable right lower lobe  pulmonary nodule compatible with a benign process. Coronary artery disease.  Aortic atherosclerosis. Electronically Signed   By: Rolm Baptise M.D.   On: 11/03/2015 10:12   Nm Pet Image Restag (ps) Skull Base To Thigh  Result Date: 11/11/2015 CLINICAL DATA:  Subsequent treatment strategy for lung cancer status post radiation, now with enlarging right apical nodule. EXAM: NUCLEAR MEDICINE PET SKULL BASE TO THIGH TECHNIQUE: 11.7 mCi F-18 FDG was injected intravenously. Full-ring PET imaging was performed from the skull base to thigh after the radiotracer. CT data was obtained and used for attenuation correction and anatomic localization. FASTING BLOOD GLUCOSE:  Value: 107 mg/dl COMPARISON:  CT chest dated 10/31/2015, 08/04/2015, and 12/15/2011. CT abdomen pelvis dated 08/29/2013. FINDINGS: NECK No hypermetabolic lymph nodes in the neck. CHEST Left upper lobe wedge resection with brachytherapy seed placement anteriorly. Associated mild hypermetabolism, max SUV 4.8, likely reactive. Additional patchy opacity medially in the left upper lobe, measuring 4.7 x 1.5 cm (series 7/ image 17), max SUV 11.8. This previously measured 4.6 x 1.1 cm on 08/04/2015 and is new from 2013. This appearance is considered worrisome for recurrent tumor. If this patient had external beam radiation within the past 6-12 months, paramediastinal radiation changes is also  possible, but that history has not been provided. 16 x 9 mm spiculated right apical nodule (series 7/ image 9), max SUV 2.8. This previously measured 16 x 8 mm in 08/04/2015 but is essentially new when compared to 2013. As such, this is considered suspicious for primary bronchogenic neoplasm or metastasis. Stable 6 mm right lower lobe nodule (series 7/ image 43), beneath the size threshold for PET sensitivity, but likely benign given long-term stability. No pleural effusion or pneumothorax. No hypermetabolic thoracic lymphadenopathy. ABDOMEN/PELVIS No abnormal hypermetabolic activity within the liver, pancreas, adrenal glands, or spleen. Tiny hiatal hernia. Hepatic steatosis. Status post cholecystectomy. Atherosclerotic calcifications the abdominal aorta and branch vessels. Colonic diverticulosis, without evidence of diverticulitis. No hypermetabolic lymph nodes in the abdomen or pelvis. SKELETON No focal hypermetabolic activity to suggest skeletal metastasis. IMPRESSION: Status post left upper lobe wedge resection with brachytherapy seed placement anteriorly. 4.7 x 1.5 cm patchy opacity medially in the left upper lobe, new from 2013, worrisome for recurrent tumor. See comments above. 16 x 9 mm spiculated right apical nodule, new from 2013, suspicious for primary bronchogenic neoplasm or metastasis. Electronically Signed   By: Julian Hy M.D.   On: 11/11/2015 17:08   Dg Bone Density  Result Date: 10/31/2015 EXAM: DUAL X-RAY ABSORPTIOMETRY (DXA) FOR BONE MINERAL DENSITY IMPRESSION: Referring Physician:  [name] PATIENT: Name: Macklyn, Glandon Patient ID: [id number] Birth Date: [birth year] Height: 61.5 in. Sex: Female Measured: 10/31/2015 Weight: 214.0 lbs. Indications: Advanced Age, Caucasian, Estrogen Deficient, Family History of Osteoporosis, Height Loss (781.91), History of Fracture (Adult) (V15.51), Hysterectomy, Low Calcium Intake (269.3), Postmenopausal, Secondary Osteoporosis, zantac  Fractures: Foot, nose Treatments: Calcium (E943.0), Vitamin D (E933.5) ASSESSMENT: The BMD measured at Femur Neck Left is 0.703 g/cm2 with a T-score of -2.4. This patient is considered osteopenic according to [facility])) criteria. Site Region Measured Date Measured Age YA BMD Significant CHANGE T-score DualFemur Neck Left 10/31/2015    [age]         -2.4    0.703 g/cm2 AP Spine  L1-L4     10[age]31/[age]    [age]         -1.6    1.000 g/cm2 World Health Organization [facility]) criteria for post-menopausal, Caucasian Women: Normal  T-score at or above -1 SD Osteopenia   T-score between -1 and -2.5 SD Osteoporosis T-score at or below -2.5 SD RECOMMENDATION: [place] recommends that FDA-approved medical therapies be considered in postmenopausal women and men age 90 or older with a: 1. Hip or vertebral (clinical or morphometric) fracture. 2. T-score of <-2.5 at the spine or hip. 3. Ten-year fracture probability by FRAX of 3% or greater for hip fracture or 20% or greater for major osteoporotic fracture. All treatment decisions require clinical judgment and consideration of individual patient factors, including patient preferences, co-morbidities, previous drug use, risk factors not captured in the FRAX model (e.g. falls, vitamin D deficiency, increased bone turnover, interval significant decline in bone density) and possible under - or over-estimation of fracture risk by FRAX. All patients should ensure an adequate intake of dietary calcium (1200 mg/d) and vitamin D (800 IU daily) unless contraindicated. FOLLOW-UP: People with diagnosed cases of osteoporosis or at high risk for fracture should have regular bone mineral density tests. For patients eligible for Medicare, routine testing is allowed once every 2 years. The testing frequency can be increased to one year for patients who have rapidly progressing disease, those who are receiving or discontinuing medical therapy to restore  bone mass, or have additional risk factors. I have reviewed this report, and agree with the above findings. [facility] FRAX* 10-year Probability of Fracture Based on femoral neck BMD: DualFemur (Left) Major Osteoporotic Fracture: 22.9% Hip Fracture:                6.6% Population:                  [place] (Caucasian) Risk Factors: History of Fracture (Adult) (V15.51), Secondary Osteoporosis *FRAX is a Materials engineer of the [employer] of [employer] for Metabolic Bone Disease, a World Pharmacologist (WHO) [facility]. ASSESSMENT: The probability of a major osteoporotic fracture is 22.9% withinthe next ten years. The probability of a hip fracture is 6.6% within the next ten tears. Electronically Signed   By: Rolm Baptise M.D.   On: 10/31/2015 16:10   Mm Digital Screening Bilateral  Result Date: 11/05/2015 CLINICAL DATA:  Screening. EXAM: DIGITAL SCREENING BILATERAL MAMMOGRAM WITH CAD COMPARISON:  None. ACR Breast Density Category b: There are scattered areas of fibroglandular density. FINDINGS: There are no findings suspicious for malignancy. Images were processed with CAD. IMPRESSION: No mammographic evidence of malignancy. A result letter of this screening mammogram will be mailed directly to the patient. RECOMMENDATION: Screening mammogram in one year. (Code:SM-B-01Y) BI-RADS CATEGORY  1: Negative. Electronically Signed   By: Lajean Manes M.D.   On: 11/05/2015 13:38    ASSESSMENT: This is a very pleasant 81 years old white female with highly suspicious for recurrent non-small cell lung cancer with left upper lobe as well as right upper lobe pulmonary nodules. She was initially diagnosed with a stage IA (T1a, N0, M0) in in October 2010 status post wedge resection with seed implants. The patient has very poor pulmonary function and high risk for biopsy.   PLAN: I had a lengthy discussion with the patient and her family today about her current disease stage, prognosis  and treatment options. I explained to the patient that she still at high risk for repeat biopsy for confirmation of her disease recurrence but the PET scan is very convincing for disease recurrence with hypermetabolism in the left upper lobe and right upper lobe pulmonary nodules. The patient may benefit from stereotactic radiotherapy  to one or both areas depending on the pulmonary function which is expected to be performed tomorrow. I will also ask the pathology department to send her original tissue for molecular studies and if the patient has a target mutation, I may consider her for treatment with targeted therapy. I will see her back for follow-up visit in one month's for reevaluation and more detailed discussion of her treatment options based on the molecular profile. For the COPD, she will continue her routine follow-up visit and evaluation by Dr. Ashok Cordia. The patient was seen during the multidisciplinary thoracic oncology clinic today by medical oncology, thoracic navigator, social worker and physical therapist. She was advised to call immediately if she has any concerning symptoms in the interval.  The patient voices understanding of current disease status and treatment options and is in agreement with the current care plan.  All questions were answered. The patient knows to call the clinic with any problems, questions or concerns. We can certainly see the patient much sooner if necessary.  Thank you so much for allowing me to participate in the care of Brandy Cameron. I will continue to follow up the patient with you and assist in her care.  I spent 40 minutes counseling the patient face to face. The total time spent in the appointment was 60 minutes.  Disclaimer: This note was dictated with voice recognition software. Similar sounding words can inadvertently be transcribed and may not be corrected upon review.   Peace Jost K. November 27, 2015, 5:14 PM

## 2015-11-28 ENCOUNTER — Telehealth: Payer: Self-pay | Admitting: Internal Medicine

## 2015-11-28 ENCOUNTER — Ambulatory Visit (INDEPENDENT_AMBULATORY_CARE_PROVIDER_SITE_OTHER): Payer: Medicare Other | Admitting: Pulmonary Disease

## 2015-11-28 ENCOUNTER — Encounter: Payer: Self-pay | Admitting: Pulmonary Disease

## 2015-11-28 ENCOUNTER — Telehealth: Payer: Self-pay | Admitting: Medical Oncology

## 2015-11-28 ENCOUNTER — Other Ambulatory Visit: Payer: Medicare Other

## 2015-11-28 ENCOUNTER — Telehealth: Payer: Self-pay | Admitting: Pulmonary Disease

## 2015-11-28 ENCOUNTER — Other Ambulatory Visit (HOSPITAL_COMMUNITY)
Admission: RE | Admit: 2015-11-28 | Discharge: 2015-11-28 | Disposition: A | Discharge status: Home / Self Care | Payer: Medicare Other | Source: Ambulatory Visit | Attending: Internal Medicine | Admitting: Internal Medicine

## 2015-11-28 VITALS — BP 124/82 | HR 71 | Ht 61.5 in | Wt 216.0 lb

## 2015-11-28 DIAGNOSIS — J309 Allergic rhinitis, unspecified: Secondary | ICD-10-CM

## 2015-11-28 DIAGNOSIS — K219 Gastro-esophageal reflux disease without esophagitis: Secondary | ICD-10-CM

## 2015-11-28 DIAGNOSIS — J449 Chronic obstructive pulmonary disease, unspecified: Secondary | ICD-10-CM | POA: Diagnosis not present

## 2015-11-28 DIAGNOSIS — R918 Other nonspecific abnormal finding of lung field: Secondary | ICD-10-CM | POA: Diagnosis not present

## 2015-11-28 DIAGNOSIS — C3490 Malignant neoplasm of unspecified part of unspecified bronchus or lung: Secondary | ICD-10-CM | POA: Insufficient documentation

## 2015-11-28 DIAGNOSIS — J9611 Chronic respiratory failure with hypoxia: Secondary | ICD-10-CM

## 2015-11-28 LAB — PULMONARY FUNCTION TEST
DL/VA % PRED: 96 %
DL/VA: 4.3 ml/min/mmHg/L
DLCO COR: 13.53 ml/min/mmHg
DLCO UNC % PRED: 67 %
DLCO cor % pred: 64 %
DLCO unc: 14 ml/min/mmHg
FEF 25-75 POST: 1.09 L/s
FEF 25-75 PRE: 0.66 L/s
FEF2575-%CHANGE-POST: 65 %
FEF2575-%PRED-POST: 81 %
FEF2575-%Pred-Pre: 49 %
FEV1-%CHANGE-POST: 20 %
FEV1-%PRED-POST: 55 %
FEV1-%Pred-Pre: 45 %
FEV1-Post: 0.97 L
FEV1-Pre: 0.81 L
FEV1FVC-%Change-Post: -7 %
FEV1FVC-%PRED-PRE: 100 %
FEV6-%Change-Post: 33 %
FEV6-%Pred-Post: 62 %
FEV6-%Pred-Pre: 46 %
FEV6-Post: 1.4 L
FEV6-Pre: 1.04 L
FEV6FVC-%Pred-Post: 105 %
FEV6FVC-%Pred-Pre: 105 %
FVC-%Change-Post: 29 %
FVC-%PRED-PRE: 45 %
FVC-%Pred-Post: 59 %
FVC-POST: 1.41 L
FVC-PRE: 1.09 L
POST FEV1/FVC RATIO: 69 %
PRE FEV1/FVC RATIO: 74 %
PRE FEV6/FVC RATIO: 100 %
Post FEV6/FVC ratio: 100 %
RV % PRED: 121 %
RV: 2.7 L
TLC % pred: 83 %
TLC: 3.9 L

## 2015-11-28 MED ORDER — FORMOTEROL FUMARATE 20 MCG/2ML IN NEBU: 20.0000 ug | INHALATION_SOLUTION | Freq: Two times a day (BID) | RESPIRATORY_TRACT | 6 refills | Status: DC

## 2015-11-28 MED ORDER — IPRATROPIUM BROMIDE 0.02 % IN SOLN: 0.5000 mg | Freq: Three times a day (TID) | RESPIRATORY_TRACT | 6 refills | Status: DC

## 2015-11-28 MED ORDER — ESOMEPRAZOLE MAGNESIUM 40 MG PO CPDR: 40.0000 mg | DELAYED_RELEASE_CAPSULE | Freq: Every day | ORAL | 3 refills | Status: DC

## 2015-11-28 MED ORDER — BUDESONIDE 0.25 MG/2ML IN SUSP: 0.2500 mg | Freq: Two times a day (BID) | RESPIRATORY_TRACT | 6 refills | Status: DC

## 2015-11-28 NOTE — Telephone Encounter (Signed)
IMAGING PET CT 11/11/15 (previously reviewed by me): Medial left upper lobe patchy hypermetabolic opacity with maximal dimension 4.7 cm & max SUV 11.8. Right upper lobe spiculated nodule with maximal dimension 1.6 cm with max SUV 2.8. 6 mm right lower lobe nodule noted. No other areas of hypermetabolism or adenopathy appreciated.

## 2015-11-28 NOTE — Telephone Encounter (Signed)
Confirmed appts

## 2015-11-28 NOTE — Telephone Encounter (Signed)
left msg confirming 9/6 apt times

## 2015-11-28 NOTE — Patient Instructions (Addendum)
   We are trying to switch you off Symbicort over to nebulizer medications.  Use your nebulizer medication as prescribed. Call me if you cannot afford these medications because then we will need to try you on Spiriva. If that's the case we can get paperwork together for you to get some assistance with affording your Symbicort & Spiriva.  Remember to rinse, gargle and spit after you use either your Symbicort or Budesonide in your nebulizer. Make sure you use these medications without your dentures in to prevent getting thrush.  Let me know if you have any trouble tolerating the Nexium I am starting you on for your reflux/regurgitation. Continue taking your Zantac.  I will see you back in 4 weeks. Please call me if you have any new breathing problems or questions.  TESTS ORDERED: 1. Spirometry with bronchodilator response at next appointment  2. Alpha-1 Antitrypsin Phenotype today

## 2015-11-28 NOTE — Progress Notes (Signed)
Test reviewed.  

## 2015-11-28 NOTE — Progress Notes (Signed)
Subjective:    Patient ID: Brandy Cameron, female    DOB: June 11, 1935, 80 y.o.   MRN: [id number]  C.C.:  Follow-up for Bilateral Lung Nodules, Moderate-Severe COPD, Chronic Hypoxic Respiratory Failure, GERD w/ Dysphagia, & Allergic Rhinitis.  HPI  Bilateral Lung Nodules: Hypermetabolic on PET/CT imaging. Patient previously referred to radiation oncology and medical oncology. Denies any hemoptysis. Denies any chest pain or pressure.   Moderate-Severe COPD: Prescribed Symbicort. She reports she is still having problems breathing. She isn't wheezing as much as she was. She reports intermittent coughing that is productive of a "white or yellow" mucus. She reports does have near syncope from her dyspnea with exertion. She hasn't been walking her dog because of her dyspnea. She is using her nebulizer twice daily and feels it does help.   Chronic Hypoxic Respiratory Failure: Patient prescribed oxygen at 2 L/m with exertion at last appointment. Also using oxygen at 2 L/m while sleeping. She is continuing to use oxygen as prescribed.   GERD w/ Dysphagia: Prescribed Zantac. Reports she is still having severe reflux especially when she drinks soda.   Allergic Rhinitis: Prescribed Singulair. Previously nonadherent to Flonase. She reports she has no significant sinus drainage.   Review of Systems She has frequent sweats. No fever or chills. No weight loss. No adenopathy in her neck, groin, or axilla. She does have intermittent headaches. No focal vision loss, weakness, numbness, or tingling.   Allergies  Allergen Reactions  . Sulfa Antibiotics Rash and Shortness Of Breath  . Amlodipine   . Pantoprazole Nausea And Vomiting  . Sulfonamide Derivatives Rash    Current Outpatient Prescriptions on File Prior to Visit  Medication Sig Dispense Refill  . albuterol (PROVENTIL) (2.5 MG/3ML) 0.083% nebulizer solution     . aspirin 81 MG tablet Take 81 mg by mouth daily.      Marland Kitchen atorvastatin (LIPITOR) 20 MG  tablet Take 20 mg by mouth daily.  3  . budesonide-formoterol (SYMBICORT) 160-4.5 MCG/ACT inhaler Inhale 2 puffs into the lungs 2 (two) times daily.    . calcium carbonate 1250 MG capsule Take 1,250 mg by mouth 2 (two) times daily with a meal.    . Cholecalciferol (VITAMIN D3) 1000 units CAPS Take 1,000 Units by mouth daily.    . Coenzyme Q10 (CO Q-10) 100 MG CAPS Take 1 tablet by mouth daily.    . cromolyn (OPTICROM) 4 % ophthalmic solution INT 1 GTT INTO OU QID  12  . dextromethorphan-guaiFENesin (MUCINEX DM) 30-600 MG 12hr tablet Take 1 tablet by mouth 2 (two) times daily.    . furosemide (LASIX) 40 MG tablet TAKE 1 TABLET BY MOUTH DAILY 30 tablet 1  . losartan (COZAAR) 100 MG tablet Take 1 tablet (100 mg total) by mouth daily. 30 tablet 12  . metoprolol tartrate (LOPRESSOR) 25 MG tablet TAKE 1 TABLET BY MOUTH TWICE DAILY 60 tablet 1  . montelukast (SINGULAIR) 10 MG tablet Take 10 mg by mouth at bedtime.    . nitroGLYCERIN (NITROSTAT) 0.4 MG SL tablet Place 1 tablet (0.4 mg total) under the tongue every 5 (five) minutes as needed for chest pain. 25 tablet 12  . OMEGA-3 FATTY ACIDS PO Take by mouth daily.    . OXYGEN Inhale into the lungs. 2 liters at nights    . potassium chloride SA (K-DUR,KLOR-CON) 20 MEQ tablet Take 20 mEq by mouth daily.     . ranitidine (ZANTAC) 150 MG tablet Take 150 mg by mouth at bedtime.    Marland Kitchen  Spacer/Aero-Holding Chambers (AEROCHAMBER MV) inhaler Use as instructed 1 each 0   No current facility-administered medications on file prior to visit.     Past Medical History:  Diagnosis Date  . Asthma   . CAD (coronary artery disease)    a. LHC 9/11 (in setting of NSTEMI): LAD irregs, mLCx 50, pRCA 50, inf-apical AK;  b. Myoview 9/13: Normal stress nuclear study.  LV Ejection Fraction: 71%   . Carotid stenosis    a. Carotid US 9/13: bilat 0-39%;  b. Carotid US 7/15: no sig stenosis  . Chronic diastolic CHF (congestive heart failure) ([place])    a. Echo 7/15: Mild LVH,  mild focal basal septal hypertrophy, EF 50-55%, no RWMA, Gr 1 DD  . COPD (chronic obstructive pulmonary disease) ([place])   . HTN (hypertension)   . Lung cancer [facility])    [name]  Marland Kitchen NSTEMI (non-ST elevated myocardial infarction) ([place])    with only mild to moderate CAD in September of 2011,There was concern for apival ballooning. >> ? Tako-Tsubo syndrome vs vasospasm    Past Surgical History:  Procedure Laterality Date  . CATARACT SURGERY    . FOOT SURGERY    . GALLBLADDER SURGERY    . KNEE SURGERY    . LUNG LOBECTOMY    . VESICOVAGINAL FISTULA CLOSURE W/ TAH      Family History  Problem Relation Age of Onset  . Heart disease Mother 54  . Asthma Mother   . Diabetes Mother   . Cancer Mother     Salivary  . Emphysema Mother   . Heart disease      POSITIVE FAMILY HX. OF   . Pancreatitis      QUESTIONABLE FROM ACE INHIBITORS  . COPD Sister     Social History   Social History  . Marital status: Divorced    Spouse name: N/A  . Number of children: N/A  . Years of education: N/A   Social History Main Topics  . Smoking status: Former Smoker    Packs/day: 1.50    Years: 42.00    Types: Cigarettes    Quit date: 04/26/2006  . Smokeless tobacco: Never Used  . Alcohol use No  . Drug use: No  . Sexual activity: No   Other Topics Concern  . None   Social History Narrative   Originally from [place]. Always lived in [place]. Prior travel to [facility]. 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 Has a dog at home. No birds, mold, or hot tub exposure. No indoor plants. No carpet in her home. Does have draperies.       Objective:   Physical Exam BP 124/82 (BP Location: Left Arm, Cuff Size: Large)   Pulse 71   Ht 5' 1.5" (1.562 m)   Wt 216 lb (98 kg)   SpO2 95%   BMI 40.15 kg/m  General:  Awake. No distress. Central obesity. Sister with patient today. Integument:  Warm & dry. No  rash on exposed skin.  Lymphatics:  No appreciated cervical or supraclavicular lymphadenoapthy. HEENT:  No scleral icterus. Mild bilateral nasal turbinate swelling. No oral ulcers. Cardiovascular:  Regular rate. Trace edema persists. Normal S1 & S2.  Pulmonary:  Slightly decreased breath sounds in bases. Mild bilateral apical end-expiratory wheeze. Normal work of breathing on supplemental oxygen. Abdomen:  Soft. Protuberant. Nontender.  PFT 11/28/15: FVC 1.09 L (45%)  FEV1 0.81 L (45%) FEV1/FVC 0.74 FEF 25-75 0.66 L (49%) positive bronchodilator response TLC 3.90 L (83%) RV 121% ERV 100% DLCO corrected 64% (Hgb 14.6)  6MWT 10/14/15:  Baseline Sat 90% on RA at rest / Nadir Sat 87% on RA (required 2 L/m with exertion to maintain & test stopped early due to dyspnea and chest discomfort)  IMAGING PET CT 11/11/15 (previously reviewed by me): Medial left upper lobe patchy hypermetabolic opacity with maximal dimension 4.7 cm & max SUV 11.8. Right upper lobe spiculated nodule with maximal dimension 1.6 cm with max SUV 2.8. 6 mm right lower lobe nodule noted. No other areas of hypermetabolism or adenopathy appreciated.  BARIUM SWALLOW 09/08/15 (per radiologist): Patent esophagus. No stricture or mass. Small hiatal hernia. Mildly abnormal motility. No reflux.  MAXILLOFACIAL CT W/O 09/02/15 (per radiologist): Mild mucosal thickening inferior lateral/posterior aspect right maxillary sinus. No air-fluid levels.  CT  CHEST W/O 08/04/15 (previously reviewed by me): Volume loss with postsurgical change in left lung. Low-attenuation soft tissue density along posterior & superior aspect surgical margin. 9 mm irregular nodule right lung apex. 4 mm triangular groundglass nodule right minor fissure. 6 mm nodule right lower lobe. Or millimeter nodule right middle lobe. Additional tiny bilateral pulmonary nodules. No pleural effusion or thickening. No pathologic hilar, mediastinal, or axillary lymphadenopathy. Precarinal  lymph node with a fatty hilum. Nodule in right apex is definitely new when compared with prior imaging.   CXR PA/LAT 01/01/15 (previously reviewed by me): Blunting of right posterior cardiac angle. No pleural effusion appreciated. Fullness in the left hilum that appears chronic and is likely residual scar tissue from prior surgery. No new nodule or opacity appreciated. Heart normal in size. Mediastinum otherwise normal in contour.   CT CHEST W/ 12/15/11 (previously reviewed by me): 6 mm nodule in right lower lobe that is noncalcified and rounded per radiologist present since 01/08/09 & unchanged. No pathologic mediastinal or hilar adenopathy. Soft tissue within the anterior mediastinum corresponding to fullness in the left hilum. Subcentimeter nodular density in left upper lobe reportedly unchanged. Hepatic steatosis noted.   CARDIAC EKG 10/14/15 (previously reviewed by me): Normal sinus rhythm. QTC 403 ms. Isoelectric T-wave lead V2. Artifact lead V5. Otherwise no signs of ischemia or conduction abnormality.  TTE (07/04/15): LV normal in size with EF 55%. No regional wall motion abnormalities. Grade 1 diastolic dysfunction. LA & RA normal in size. RV normal in size and function. No aortic stenosis or regurgitation. No mitral stenosis. No pulmonic regurgitation. No significant tricuspid regurgitation. No pericardial effusion.  NUCLEAR STRESS TEST (07/03/15): Normal study with low risk. No T-wave inversion or ST segment deviation. EF 66% with hyperdynamic LV.  PATHOLOGY L Parotid Gland FNA (11/18/14): Benign fibrofatty soft tissue. No features of salivary gland present. LUL Wedge Resection (02/10/09): Invasive adenocarcinoma 2.0 cm LUL Additional Parenchymal Margin (02/10/09): No tumor identified 4L Lymph Node (02/10/09): No tumor identified 10L Lymph Node (02/10/09): No tumor identified  MICROBIOLOGY SPUTUM CTX (09/03/15):  AFB ngtd / Fungal ngtd  LABS 08/29/15 IgE:  15 RAST Panel: [place] 0.14  / [place] 0.12 / [place] 0.15 / [place] 0.12  06/20/15 BMP: 143/4.7/99/33/14/0.71/88/9.4 BNP: 62  01/16/15 CBC: 16.7/15.0/45.6/161 ProBNP: 33  02/06/09 ABG on RA: 7.395 / 42.7 / 125 / 98.6%    Assessment & Plan:  80 y.o. female remotely diagnosed with COPD and also previously having underwent left upper lobe wedge resection for non-small cell lung cancer. I extensively reviewed the patient's PET/CT results  with both she and her sister who was present today. The patient is extremely concerned about undergoing endotracheal intubation and the potential risk of death with any invasive procedures at this time. She wishes to continue to decline/defer biopsy given her poor pulmonary status. We discussed at length that there is a chance creatinine her probable underlying lung cancer empirically based on her previous biopsy may be ineffective if this probable new malignancy is of a different type. She understands this. We also discussed the fact that given the severity of her lung disease radiation therapy may not be a viable option of treatment. With her ongoing reflux and underlying moderate-severe COPD there are medication adjustments that I can make help improve her pulmonary function certainly. Her spirometry today does show significant bronchodilator response indicating that treatment with alternative inhaled medication may be of greater efficacy such as through a nebulizer. I instructed the patient to contact my office if she had any difficulty affording her medications or problems with the medication adjustments we are attempting to make today. He may have to make further medication adjustments depending upon affordability and availability.  1. Bilateral Lung Nodules:  Likely malignancy recurrence. Patient has been evaluated by medical and radiation oncology. Defer to their recommendations on treatment as patient continues to decline biopsy. 2. Moderate-Severe COPD: Attempting to switch from  Symbicort to Perforomist & Pulmicort nebulized twice daily along with Atrovent nebulized 3 times daily. If this regimen is too expensive plan to continue Symbicort with her spacer and add Spiriva Respimat 2.5 to her regimen. She will require prescription drug assistance paperwork for both Symbicort and Spiriva in this event. Checking spirometry with bronchodilator challenge at next appointment to ensure maximal bronchodilatation.  3. Chronic Hypoxic Respiratory Failure: Continuing oxygen at 2 L/m while sleeping & with exertion. 4. GERD w/ Dysphagia: Continuing Zantac by mouth daily at bedtime. Adding Nexium 40 mg by mouth every morning to regimen. 5. Allergic Rhinitis: Continuing Singulair. No changes at this time. 6. Immunizations: Status post Prevnar September 2016. Plan for influenza vaccine at next appointment along with Pneumovax.  7. Follow-up: Patient to return to clinic in 4 weeks or sooner if needed.  Sonia Baller Ashok Cordia, M.D. [facility]monary [facility] Critical [facility] Pager:  [phone] After 3pm or if no response, call [phone] 4:38 PM 11/28/15

## 2015-12-02 LAB — ALPHA-1 ANTITRYPSIN PHENOTYPE: A-1 Antitrypsin: 144 mg/dL (ref 83–199)

## 2015-12-03 ENCOUNTER — Telehealth: Payer: Self-pay | Admitting: Pulmonary Disease

## 2015-12-03 NOTE — Telephone Encounter (Signed)
Called spoke with pt. She c/o burning sensation at the top of her stomach, under her breasts.  She feels that the Pulmicort, perforomist, and Atrovent nebs seem to be causing these symptoms. The symptom started 1 hour ago. She denies any SOB or chest tightness. She states that her breathing is doing great at this time. She states she did take her Nexium at lunch today and will take a zantac at bedtime. I explained to her that I would send a message to [facility]. She voiced understanding and had no further questions.   Spoke with JN and made him aware of situation.  Per JN Try Tums or Rolaids If burning does not go away within a few minutes  May need to be checked out at urgent care  called spoke with pt. Informed her of recs. She voiced understanding and had no further questions.

## 2015-12-04 ENCOUNTER — Encounter: Payer: Self-pay | Admitting: Internal Medicine

## 2015-12-08 ENCOUNTER — Ambulatory Visit
Admission: RE | Admit: 2015-12-08 | Discharge: 2015-12-08 | Disposition: A | Discharge status: Home / Self Care | Payer: Medicare Other | Source: Ambulatory Visit | Attending: Radiation Oncology | Admitting: Radiation Oncology

## 2015-12-08 ENCOUNTER — Encounter: Payer: Self-pay | Admitting: Radiation Oncology

## 2015-12-08 VITALS — BP 149/71 | HR 74 | Temp 97.7°F | Resp 18 | Wt 220.9 lb

## 2015-12-08 DIAGNOSIS — C3412 Malignant neoplasm of upper lobe, left bronchus or lung: Secondary | ICD-10-CM | POA: Diagnosis not present

## 2015-12-08 DIAGNOSIS — C3411 Malignant neoplasm of upper lobe, right bronchus or lung: Secondary | ICD-10-CM | POA: Diagnosis not present

## 2015-12-08 NOTE — Progress Notes (Signed)
Radiation Oncology         [phone] ________________________________  Name: Brandy Cameron MRN: [id number]  Date: 12/08/2015  DOB: 1935-09-13  Follow-Up Visit Note  CC: [name], MD  [name], MD  Diagnosis:   80 yo woman with possible recurrent non-small cell carcinoma of the left upper lung with synchronous right upper lung cancer.    ICD-9-CM ICD-10-CM   1. Primary cancer of left upper lobe of lung (HCC) 162.3 C34.12     Narrative:  The patient returns today for routine follow-up.  She had her pulmonary function test 11/28/2015, revealing poor pulmonary function. She does not wish to have a biopsy of the lung for fear of complications.   On review of systems, the patient has some weight gained. Vitals stable. Denies pain. Reports exertional dyspnea continues. Reports an intermittent productive cough continues, but less frequently. Reports sputum from cough is clear to white. Denies hemoptysis. Wheezing continues. Oxygen therapy 2 liters via nasal cannula noted. Reports chest tightness continues. Reports occasional dizziness with standing. Reports occasional headaches continue. Denies nausea or vomiting. Generalized edema noted. Edema in ankles much worse.   ALLERGIES:  is allergic to sulfa antibiotics; amlodipine; pantoprazole; and sulfonamide derivatives.  Meds: Current Outpatient Prescriptions  Medication Sig Dispense Refill  . albuterol (PROVENTIL) (2.5 MG/3ML) 0.083% nebulizer solution     . aspirin 81 MG tablet Take 81 mg by mouth daily.      Marland Kitchen atorvastatin (LIPITOR) 20 MG tablet Take 20 mg by mouth daily.  3  . budesonide (PULMICORT) 0.25 MG/2ML nebulizer solution Take 2 mLs (0.25 mg total) by nebulization 2 (two) times daily. Dx code J44.9 120 mL 6  . budesonide-formoterol (SYMBICORT) 160-4.5 MCG/ACT inhaler Inhale 2 puffs into the lungs 2 (two) times daily.    . calcium carbonate 1250 MG capsule Take 1,250 mg by mouth 2 (two) times daily with a meal.    .  Cholecalciferol (VITAMIN D3) 1000 units CAPS Take 1,000 Units by mouth daily.    . cromolyn (OPTICROM) 4 % ophthalmic solution INT 1 GTT INTO OU QID  12  . dextromethorphan-guaiFENesin (MUCINEX DM) 30-600 MG 12hr tablet Take 1 tablet by mouth 2 (two) times daily.    Marland Kitchen esomeprazole (NEXIUM) 40 MG capsule Take 1 capsule (40 mg total) by mouth daily at 12 noon. 30 capsule 3  . formoterol (PERFOROMIST) 20 MCG/2ML nebulizer solution Take 2 mLs (20 mcg total) by nebulization 2 (two) times daily. DX code J44.9 120 mL 6  . furosemide (LASIX) 40 MG tablet TAKE 1 TABLET BY MOUTH DAILY 30 tablet 1  . ipratropium (ATROVENT) 0.02 % nebulizer solution Take 2.5 mLs (0.5 mg total) by nebulization 3 (three) times daily. DX code J44.9 225 mL 6  . losartan (COZAAR) 100 MG tablet Take 1 tablet (100 mg total) by mouth daily. 30 tablet 12  . metoprolol tartrate (LOPRESSOR) 25 MG tablet TAKE 1 TABLET BY MOUTH TWICE DAILY 60 tablet 1  . montelukast (SINGULAIR) 10 MG tablet Take 10 mg by mouth at bedtime.    . nitroGLYCERIN (NITROSTAT) 0.4 MG SL tablet Place 1 tablet (0.4 mg total) under the tongue every 5 (five) minutes as needed for chest pain. 25 tablet 12  . OMEGA-3 FATTY ACIDS PO Take by mouth daily.    . OXYGEN Inhale into the lungs. 2 liters at nights    . potassium chloride SA (K-DUR,KLOR-CON) 20 MEQ tablet Take 20 mEq by mouth daily.     Marland Kitchen  ranitidine (ZANTAC) 150 MG tablet Take 150 mg by mouth at bedtime.    Marland Kitchen Spacer/Aero-Holding Chambers (AEROCHAMBER MV) inhaler Use as instructed 1 each 0  . Coenzyme Q10 (CO Q-10) 100 MG CAPS Take 1 tablet by mouth daily.     No current facility-administered medications for this encounter.     Physical Findings: The patient is in no acute distress. Patient is alert and oriented.  weight is 220 lb 14.4 oz (100.2 kg). Her oral temperature is 97.7 F (36.5 C). Her blood pressure is 149/71 (abnormal) and her pulse is 74. Her respiration is 18 and oxygen saturation is 93%. .  No  significant changes.  Lab Findings: Lab Results  Component Value Date   WBC 12.3 (H) 10/07/2015   HGB 14.4 10/07/2015   HGB 14.5 12/17/2011   HCT 44.3 10/07/2015   HCT 43.4 12/17/2011   PLT 309.0 10/07/2015   PLT 267 12/17/2011    Lab Results  Component Value Date   NA 145 10/07/2015   NA 142 12/15/2011   K 4.1 10/07/2015   K 4.7 12/15/2011   CO2 34 (H) 10/07/2015   CO2 32 12/15/2011   GLUCOSE 109 (H) 10/07/2015   GLUCOSE 118 12/15/2011   BUN 21 10/07/2015   BUN 14 12/15/2011   CREATININE 0.74 10/07/2015   CREATININE 0.71 06/20/2015   BILITOT 0.3 10/07/2015   BILITOT 0.70 12/15/2011   ALKPHOS 126 (H) 10/07/2015   ALKPHOS 124 (H) 12/15/2011   AST 14 10/07/2015   AST 23 12/15/2011   ALT 18 10/07/2015   ALT 35 12/15/2011   PROT 6.9 10/07/2015   PROT 7.2 12/15/2011   ALBUMIN 4.1 10/07/2015   ALBUMIN 3.7 12/15/2011   CALCIUM 9.5 10/07/2015   CALCIUM 9.4 12/15/2011    Radiographic Findings: Nm Pet Image Restag (ps) Skull Base To Thigh  Result Date: 11/11/2015 CLINICAL DATA:  Subsequent treatment strategy for lung cancer status post radiation, now with enlarging right apical nodule. EXAM: NUCLEAR MEDICINE PET SKULL BASE TO THIGH TECHNIQUE: 11.7 mCi F-18 FDG was injected intravenously. Full-ring PET imaging was performed from the skull base to thigh after the radiotracer. CT data was obtained and used for attenuation correction and anatomic localization. FASTING BLOOD GLUCOSE:  Value: 107 mg/dl COMPARISON:  CT chest dated 10/31/2015, 08/04/2015, and 12/15/2011. CT abdomen pelvis dated 08/29/2013. FINDINGS: NECK No hypermetabolic lymph nodes in the neck. CHEST Left upper lobe wedge resection with brachytherapy seed placement anteriorly. Associated mild hypermetabolism, max SUV 4.8, likely reactive. Additional patchy opacity medially in the left upper lobe, measuring 4.7 x 1.5 cm (series 7/ image 17), max SUV 11.8. This previously measured 4.6 x 1.1 cm on 08/04/2015 and is new  from 2013. This appearance is considered worrisome for recurrent tumor. If this patient had external beam radiation within the past 6-12 months, paramediastinal radiation changes is also possible, but that history has not been provided. 16 x 9 mm spiculated right apical nodule (series 7/ image 9), max SUV 2.8. This previously measured 16 x 8 mm in 08/04/2015 but is essentially new when compared to 2013. As such, this is considered suspicious for primary bronchogenic neoplasm or metastasis. Stable 6 mm right lower lobe nodule (series 7/ image 43), beneath the size threshold for PET sensitivity, but likely benign given long-term stability. No pleural effusion or pneumothorax. No hypermetabolic thoracic lymphadenopathy. ABDOMEN/PELVIS No abnormal hypermetabolic activity within the liver, pancreas, adrenal glands, or spleen. Tiny hiatal hernia. Hepatic steatosis. Status post cholecystectomy. Atherosclerotic calcifications the abdominal  aorta and branch vessels. Colonic diverticulosis, without evidence of diverticulitis. No hypermetabolic lymph nodes in the abdomen or pelvis. SKELETON No focal hypermetabolic activity to suggest skeletal metastasis. IMPRESSION: Status post left upper lobe wedge resection with brachytherapy seed placement anteriorly. 4.7 x 1.5 cm patchy opacity medially in the left upper lobe, new from 2013, worrisome for recurrent tumor. See comments above. 16 x 9 mm spiculated right apical nodule, new from 2013, suspicious for primary bronchogenic neoplasm or metastasis. Electronically Signed   By: Julian Hy M.D.   On: 11/11/2015 17:08    Impression: Ms. Keckler is a 80 yo female with possible recurrent non-small cell carcinoma of the left upper lung with synchronous right upper lung cancer.  Dr. Ashok Cordia does not believe biopsy is a good option for the patient due to her PFT.  Molecular profiling of her previous lung cancer specimen is pending to determine eligilbility for  targeted/immunotherapy.  She sees Dr. Julien Nordmann back on 9/6 to review that.  Plan:  Follow-up on 9/7 to discuss possible options.  _____________________________________  Sheral Apley Tammi Klippel, M.D.    This document serves as a record of services personally performed by Tyler Pita, MD. It was created on his behalf by Lendon Collar, a trained medical scribe. The creation of this record is based on the scribe's personal observations and the provider's statements to them. This document has been checked and approved by the attending provider.

## 2015-12-08 NOTE — Progress Notes (Addendum)
Vitals stable. Weight gain noted. Denies pain. Reports exertional dyspnea continues. Reports an intermittent productive cough continues but, less frequency. Reports sputum from cough is clear to white. Denies hemoptysis. Wheezing continues. Oxygen therapy 2 liters via nasal cannula noted. Reports chest tightness continues. Reports occasional dizziness with standing. Reports occasional headaches continue. Denies nausea or vomiting. Generalized edema noted. Edema in ankles much worse.   BP (!) 149/71   Pulse 74   Temp 97.7 F (36.5 C) (Oral)   Resp 18   Wt 220 lb 14.4 oz (100.2 kg)   SpO2 93%   BMI 41.06 kg/m  Wt Readings from Last 3 Encounters:  12/08/15 220 lb 14.4 oz (100.2 kg)  11/28/15 216 lb (98 kg)  11/27/15 216 lb (98 kg)

## 2015-12-09 ENCOUNTER — Encounter (HOSPITAL_COMMUNITY): Payer: Self-pay

## 2015-12-09 NOTE — Addendum Note (Signed)
Encounter addended by: Heywood Footman, RN on: 12/09/2015 10:53 AM<BR>    Actions taken: Charge Capture section accepted

## 2015-12-12 DIAGNOSIS — J449 Chronic obstructive pulmonary disease, unspecified: Secondary | ICD-10-CM | POA: Diagnosis not present

## 2015-12-12 DIAGNOSIS — Z85118 Personal history of other malignant neoplasm of bronchus and lung: Secondary | ICD-10-CM | POA: Diagnosis not present

## 2015-12-12 DIAGNOSIS — R0602 Shortness of breath: Secondary | ICD-10-CM | POA: Diagnosis not present

## 2015-12-15 ENCOUNTER — Encounter (HOSPITAL_COMMUNITY): Payer: Self-pay

## 2015-12-15 DIAGNOSIS — R0602 Shortness of breath: Secondary | ICD-10-CM | POA: Diagnosis not present

## 2015-12-15 DIAGNOSIS — J449 Chronic obstructive pulmonary disease, unspecified: Secondary | ICD-10-CM | POA: Diagnosis not present

## 2015-12-15 DIAGNOSIS — Z85118 Personal history of other malignant neoplasm of bronchus and lung: Secondary | ICD-10-CM | POA: Diagnosis not present

## 2015-12-31 ENCOUNTER — Telehealth: Payer: Self-pay | Admitting: Internal Medicine

## 2015-12-31 ENCOUNTER — Other Ambulatory Visit (HOSPITAL_BASED_OUTPATIENT_CLINIC_OR_DEPARTMENT_OTHER): Payer: Medicare Other

## 2015-12-31 ENCOUNTER — Ambulatory Visit (HOSPITAL_BASED_OUTPATIENT_CLINIC_OR_DEPARTMENT_OTHER): Payer: Medicare Other | Admitting: Internal Medicine

## 2015-12-31 ENCOUNTER — Encounter: Payer: Self-pay | Admitting: Internal Medicine

## 2015-12-31 VITALS — BP 144/65 | HR 85 | Temp 98.5°F | Resp 17 | Ht 61.5 in | Wt 219.5 lb

## 2015-12-31 DIAGNOSIS — J449 Chronic obstructive pulmonary disease, unspecified: Secondary | ICD-10-CM

## 2015-12-31 DIAGNOSIS — C3412 Malignant neoplasm of upper lobe, left bronchus or lung: Secondary | ICD-10-CM | POA: Diagnosis not present

## 2015-12-31 LAB — CBC WITH DIFFERENTIAL/PLATELET
BASO%: 0.5 % (ref 0.0–2.0)
Basophils Absolute: 0.1 10*3/uL (ref 0.0–0.1)
EOS%: 2.1 % (ref 0.0–7.0)
Eosinophils Absolute: 0.2 10*3/uL (ref 0.0–0.5)
HEMATOCRIT: 42.7 % (ref 34.8–46.6)
HEMOGLOBIN: 13.7 g/dL (ref 11.6–15.9)
LYMPH#: 2.4 10*3/uL (ref 0.9–3.3)
LYMPH%: 24.5 % (ref 14.0–49.7)
MCH: 28 pg (ref 25.1–34.0)
MCHC: 32.1 g/dL (ref 31.5–36.0)
MCV: 87.3 fL (ref 79.5–101.0)
MONO#: 0.9 10*3/uL (ref 0.1–0.9)
MONO%: 9.2 % (ref 0.0–14.0)
NEUT%: 63.7 % (ref 38.4–76.8)
NEUTROS ABS: 6.3 10*3/uL (ref 1.5–6.5)
PLATELETS: 278 10*3/uL (ref 145–400)
RBC: 4.89 10*6/uL (ref 3.70–5.45)
RDW: 14.9 % — AB (ref 11.2–14.5)
WBC: 10 10*3/uL (ref 3.9–10.3)

## 2015-12-31 LAB — COMPREHENSIVE METABOLIC PANEL
ALT: 22 U/L (ref 0–55)
ANION GAP: 11 meq/L (ref 3–11)
AST: 16 U/L (ref 5–34)
Albumin: 3.4 g/dL — ABNORMAL LOW (ref 3.5–5.0)
Alkaline Phosphatase: 149 U/L (ref 40–150)
BILIRUBIN TOTAL: 0.31 mg/dL (ref 0.20–1.20)
BUN: 14.7 mg/dL (ref 7.0–26.0)
CALCIUM: 9.4 mg/dL (ref 8.4–10.4)
CO2: 32 mEq/L — ABNORMAL HIGH (ref 22–29)
CREATININE: 0.8 mg/dL (ref 0.6–1.1)
Chloride: 104 mEq/L (ref 98–109)
EGFR: 74 mL/min/{1.73_m2} — ABNORMAL LOW (ref >90)
Glucose: 111 mg/dl (ref 70–140)
Potassium: 3.9 mEq/L (ref 3.5–5.1)
Sodium: 146 mEq/L — ABNORMAL HIGH (ref 136–145)
TOTAL PROTEIN: 6.7 g/dL (ref 6.4–8.3)

## 2015-12-31 NOTE — Telephone Encounter (Signed)
Gave patient avs report and appointments for December. Central radiology will call re scans - patient aware.

## 2015-12-31 NOTE — Progress Notes (Signed)
[place] [phone]   [phone]  OFFICE PROGRESS NOTE  Brandy Calico, Brandy Cameron [address] [facility] [address] [place] [address]  DIAGNOSIS: recurrent non-small cell lung cancer, adenocarcinoma with positive PDL 1 expression (60%) with left upper lobe as well as right upper lobe pulmonary nodules. She was initially diagnosed with a stage IA (T1a, N0, M0) in in October 2010.   Molecular studies USAA one): Genomic Alterations Identified? KRAS Q61L RBM10 [id number]* Additional Findings? Microsatellite status MS-Stable Tumor Mutation Burden Cannot Be Determined Additional Disease-relevant Genes with No Reportable Alterations Identified? EGFR ALK BRAF MET RET ERBB2 ROS1  PRIOR THERAPY: 1) status post wedge resection with seed implants.   CURRENT THERAPY:  INTERVAL HISTORY: Brandy Cameron 80 y.o. female returns to the clinic today for follow-up visit accompanied by her friend. The patient is feeling fine today with no specific complaints except for the persistent shortness of breath secondary to COPD. She denied having any significant chest pain but continues to have cough with no hemoptysis. She denied having any significant weight loss or night sweats. She has no nausea, vomiting, diarrhea or constipation. She has molecular studies as well as PDL 1 expression performed recently and she is here for evaluation and discussion of her treatment options.  MEDICAL HISTORY: Past Medical History:  Diagnosis Date  . Asthma   . CAD (coronary artery disease)    a. LHC 9/11 (in setting of NSTEMI): LAD irregs, mLCx 50, pRCA 50, inf-apical AK;  b. Myoview 9/13: Normal stress nuclear study.  LV Ejection Fraction: 71%   . Carotid stenosis    a. Carotid US 9/13: bilat 0-39%;  b. Carotid US 7/15: no sig stenosis  . Chronic diastolic CHF (congestive heart failure) ([place])    a. Echo 7/15: Mild LVH, mild focal basal septal hypertrophy, EF 50-55%, no RWMA,  Gr 1 DD  . COPD (chronic obstructive pulmonary disease) ([place])   . HTN (hypertension)   . Lung cancer [facility])    [name]  Marland Kitchen NSTEMI (non-ST elevated myocardial infarction) ([place])    with only mild to moderate CAD in September of 2011,There was concern for apival ballooning. >> ? Tako-Tsubo syndrome vs vasospasm    ALLERGIES:  is allergic to sulfa antibiotics; amlodipine; pantoprazole; and sulfonamide derivatives.  MEDICATIONS:  Current Outpatient Prescriptions  Medication Sig Dispense Refill  . albuterol (PROVENTIL) (2.5 MG/3ML) 0.083% nebulizer solution     . aspirin 81 MG tablet Take 81 mg by mouth daily.      Marland Kitchen atorvastatin (LIPITOR) 20 MG tablet Take 20 mg by mouth daily.  3  . budesonide (PULMICORT) 0.25 MG/2ML nebulizer solution Take 2 mLs (0.25 mg total) by nebulization 2 (two) times daily. Dx code J44.9 120 mL 6  . budesonide-formoterol (SYMBICORT) 160-4.5 MCG/ACT inhaler Inhale 2 puffs into the lungs 2 (two) times daily.    . calcium carbonate 1250 MG capsule Take 1,250 mg by mouth 2 (two) times daily with a meal.    . Cholecalciferol (VITAMIN D3) 1000 units CAPS Take 1,000 Units by mouth daily.    . Coenzyme Q10 (CO Q-10) 100 MG CAPS Take 1 tablet by mouth daily.    . cromolyn (OPTICROM) 4 % ophthalmic solution INT 1 GTT INTO OU QID  12  . dextromethorphan-guaiFENesin (MUCINEX DM) 30-600 MG 12hr tablet Take 1 tablet by mouth 2 (two) times daily.    Marland Kitchen  esomeprazole (NEXIUM) 40 MG capsule Take 1 capsule (40 mg total) by mouth daily at 12 noon. 30 capsule 3  . formoterol (PERFOROMIST) 20 MCG/2ML nebulizer solution Take 2 mLs (20 mcg total) by nebulization 2 (two) times daily. DX code J44.9 120 mL 6  . furosemide (LASIX) 40 MG tablet TAKE 1 TABLET BY MOUTH DAILY 30 tablet 1  . ipratropium (ATROVENT) 0.02 % nebulizer solution Take 2.5 mLs (0.5 mg total) by nebulization 3 (three) times daily. DX code J44.9  225 mL 6  . losartan (COZAAR) 100 MG tablet Take 1 tablet (100 mg total) by mouth daily. 30 tablet 12  . metoprolol tartrate (LOPRESSOR) 25 MG tablet TAKE 1 TABLET BY MOUTH TWICE DAILY 60 tablet 1  . montelukast (SINGULAIR) 10 MG tablet Take 10 mg by mouth at bedtime.    . nitroGLYCERIN (NITROSTAT) 0.4 MG SL tablet Place 1 tablet (0.4 mg total) under the tongue every 5 (five) minutes as needed for chest pain. 25 tablet 12  . OMEGA-3 FATTY ACIDS PO Take by mouth daily.    . OXYGEN Inhale into the lungs. 2 liters at nights    . potassium chloride SA (K-DUR,KLOR-CON) 20 MEQ tablet Take 20 mEq by mouth daily.     . ranitidine (ZANTAC) 150 MG tablet Take 150 mg by mouth at bedtime.    Marland Kitchen Spacer/Aero-Holding Chambers (AEROCHAMBER MV) inhaler Use as instructed 1 each 0   No current facility-administered medications for this visit.     SURGICAL HISTORY:  Past Surgical History:  Procedure Laterality Date  . CATARACT SURGERY    . FOOT SURGERY    . GALLBLADDER SURGERY    . KNEE SURGERY    . LUNG LOBECTOMY    . VESICOVAGINAL FISTULA CLOSURE W/ TAH      REVIEW OF SYSTEMS:  Constitutional: positive for fatigue Eyes: negative Ears, nose, mouth, throat, and face: negative Respiratory: positive for cough, dyspnea on exertion and wheezing Cardiovascular: negative Gastrointestinal: negative Genitourinary:negative Integument/breast: negative Hematologic/lymphatic: negative Musculoskeletal:negative Neurological: negative Behavioral/Psych: negative Endocrine: negative Allergic/Immunologic: negative   PHYSICAL EXAMINATION: General appearance: alert, cooperative, fatigued and no distress Head: Normocephalic, without obvious abnormality, atraumatic Neck: no adenopathy, no JVD, supple, symmetrical, trachea midline and thyroid not enlarged, symmetric, no tenderness/mass/nodules Lymph nodes: Cervical, supraclavicular, and axillary nodes normal. Resp: diminished breath sounds bilaterally Back:  symmetric, no curvature. ROM normal. No CVA tenderness. Cardio: regular rate and rhythm, S1, S2 normal, no murmur, click, rub or gallop GI: soft, non-tender; bowel sounds normal; no masses,  no organomegaly Extremities: extremities normal, atraumatic, no cyanosis or edema Neurologic: Alert and oriented X 3, normal strength and tone. Normal symmetric reflexes. Normal coordination and gait  ECOG PERFORMANCE STATUS: 1 - Symptomatic but completely ambulatory  There were no vitals taken for this visit.  LABORATORY DATA: Lab Results  Component Value Date   WBC 12.3 (H) 10/07/2015   HGB 14.4 10/07/2015   HCT 44.3 10/07/2015   MCV 86.7 10/07/2015   PLT 309.0 10/07/2015      Chemistry      Component Value Date/Time   NA 145 10/07/2015 0940   NA 142 12/15/2011 0920   K 4.1 10/07/2015 0940   K 4.7 12/15/2011 0920   CL 103 10/07/2015 0940   CL 101 12/15/2011 0920   CO2 34 (H) 10/07/2015 0940   CO2 32 12/15/2011 0920   BUN 21 10/07/2015 0940   BUN 14 12/15/2011 0920   CREATININE 0.74 10/07/2015 0940   CREATININE 0.71 06/20/2015 1441  Component Value Date/Time   CALCIUM 9.5 10/07/2015 0940   CALCIUM 9.4 12/15/2011 0920   ALKPHOS 126 (H) 10/07/2015 0940   ALKPHOS 124 (H) 12/15/2011 0920   AST 14 10/07/2015 0940   AST 23 12/15/2011 0920   ALT 18 10/07/2015 0940   ALT 35 12/15/2011 0920   BILITOT 0.3 10/07/2015 0940   BILITOT 0.70 12/15/2011 0920       RADIOGRAPHIC STUDIES: No results found.  ASSESSMENT AND PLAN: This is a very pleasant 80 years old white female with questionable recurrent non-small cell lung cancer, adenocarcinoma diagnosed in 2010 with a stage IA non-small cell lung cancer status post left upper lobe wedge resection as well as seed implants. The patient has questionable disease recurrence in the left upper lobe as well as right upper lobe. She is not a good candidate for repeat biopsy because of her comorbidities and severe COPD. Molecular studies  performed on the previous resected lung cancer showed no actionable mutation but PDL 1 expression was 60%. The patient could be a candidate for treatment with immunotherapy as single agent in the future if she continues to have disease progression. She will see Dr. Tammi Klippel tomorrow for evaluation and consideration of stereotactic radiotherapy if feasible. I will see her back for follow-up visit in 3 months with repeat CT scan of the chest for restaging of her disease. If she has evidence for disease progression at that time, I may consider her for treatment with [place]. The patient and her friend agreed to the current plan. She was advised to call immediately if she has any concerning symptoms in the interval. The patient voices understanding of current disease status and treatment options and is in agreement with the current care plan.  All questions were answered. The patient knows to call the clinic with any problems, questions or concerns. We can certainly see the patient much sooner if necessary.  I spent 20 minutes counseling the patient face to face. The total time spent in the appointment was 25 minutes.  Disclaimer: This note was dictated with voice recognition software. Similar sounding words can inadvertently be transcribed and may not be corrected upon review.

## 2016-01-01 ENCOUNTER — Encounter: Payer: Self-pay | Admitting: Radiation Oncology

## 2016-01-01 ENCOUNTER — Ambulatory Visit
Admission: RE | Admit: 2016-01-01 | Discharge: 2016-01-01 | Disposition: A | Discharge status: Home / Self Care | Payer: Medicare Other | Source: Ambulatory Visit | Attending: Radiation Oncology | Admitting: Radiation Oncology

## 2016-01-01 VITALS — BP 143/74 | HR 73 | Resp 20 | Wt 223.0 lb

## 2016-01-01 DIAGNOSIS — R918 Other nonspecific abnormal finding of lung field: Secondary | ICD-10-CM | POA: Diagnosis not present

## 2016-01-01 DIAGNOSIS — C3412 Malignant neoplasm of upper lobe, left bronchus or lung: Secondary | ICD-10-CM | POA: Diagnosis not present

## 2016-01-01 DIAGNOSIS — J449 Chronic obstructive pulmonary disease, unspecified: Secondary | ICD-10-CM | POA: Diagnosis not present

## 2016-01-01 DIAGNOSIS — Z85118 Personal history of other malignant neoplasm of bronchus and lung: Secondary | ICD-10-CM | POA: Diagnosis not present

## 2016-01-01 DIAGNOSIS — Z9981 Dependence on supplemental oxygen: Secondary | ICD-10-CM | POA: Diagnosis not present

## 2016-01-01 NOTE — Progress Notes (Signed)
Radiation Oncology         [phone] ________________________________  Name: Brandy Cameron MRN: [id number]  Date: 01/01/2016  DOB: [birth year]  Follow-Up Visit Note  CC: Brandy Calico, MD  Brandy Glazier, MD  Diagnosis:   80 yo woman with possible recurrent non-small cell carcinoma of the left upper lung with synchronous clinical stage IA right upper lung cancer.    ICD-9-CM ICD-10-CM   1. Primary cancer of left upper lobe of lung (HCC) 162.3 C34.12   2. Multiple lung nodules on CT 793.19 R91.8     Narrative:  The patient returns today for follow-up. The patient is here for further discussion of radiation treatment in the management of her disease.   Vitals stable. Weight gain noted. Pulsating oxygen therapy 2 liters via nasal cannula noted. Patient seen by Dr. Julien Cameron yesterday, she understands she will have a CT in 3 months but presently has no options from a chemotherapy standpoint. Scheduled to follow up with Dr. Ashok Cameron on Monday and have a "breathing test".   On review of systems, the patient reports that she is doing well overall. She denies any chest pain, cough, difficulty swallowing, fevers, chills, night sweats, unintended weight changes. She reports her breathing has gotten worse recently. She reports shortness of breath while walking, stating "I can't breath". Her breathing trouble is worse in the mornings. She has COPD. She reports fatigue. She reports only 1 recent headache and reports dizziness with standing. She denies any bowel or bladder disturbances, and denies abdominal pain, nausea or vomiting. She denies any new musculoskeletal or joint aches or pains. A complete review of systems is obtained and is otherwise negative.   ALLERGIES:  is allergic to sulfa antibiotics; amlodipine; pantoprazole; and sulfonamide derivatives.  Meds: Current Outpatient Prescriptions  Medication Sig Dispense Refill  . albuterol (PROVENTIL) (2.5 MG/3ML) 0.083% nebulizer solution       . aspirin 81 MG tablet Take 81 mg by mouth daily.      Marland Kitchen atorvastatin (LIPITOR) 20 MG tablet Take 20 mg by mouth daily.  3  . budesonide (PULMICORT) 0.25 MG/2ML nebulizer solution Take 2 mLs (0.25 mg total) by nebulization 2 (two) times daily. Dx code J44.9 120 mL 6  . budesonide-formoterol (SYMBICORT) 160-4.5 MCG/ACT inhaler Inhale 2 puffs into the lungs 2 (two) times daily.    . calcium carbonate 1250 MG capsule Take 1,250 mg by mouth 2 (two) times daily with a meal.    . Cholecalciferol (VITAMIN D3) 1000 units CAPS Take 1,000 Units by mouth daily.    . Coenzyme Q10 (CO Q-10) 100 MG CAPS Take 1 tablet by mouth daily.    . cromolyn (OPTICROM) 4 % ophthalmic solution INT 1 GTT INTO OU QID  12  . dextromethorphan-guaiFENesin (MUCINEX DM) 30-600 MG 12hr tablet Take 1 tablet by mouth 2 (two) times daily.    Marland Kitchen esomeprazole (NEXIUM) 40 MG capsule Take 1 capsule (40 mg total) by mouth daily at 12 noon. 30 capsule 3  . formoterol (PERFOROMIST) 20 MCG/2ML nebulizer solution Take 2 mLs (20 mcg total) by nebulization 2 (two) times daily. DX code J44.9 120 mL 6  . furosemide (LASIX) 40 MG tablet TAKE 1 TABLET BY MOUTH DAILY 30 tablet 1  . ipratropium (ATROVENT) 0.02 % nebulizer solution Take 2.5 mLs (0.5 mg total) by nebulization 3 (three) times daily. DX code J44.9 225 mL 6  . losartan (COZAAR) 100 MG tablet Take 1 tablet (100 mg total) by mouth daily. 30 tablet  12  . metoprolol tartrate (LOPRESSOR) 25 MG tablet TAKE 1 TABLET BY MOUTH TWICE DAILY 60 tablet 1  . montelukast (SINGULAIR) 10 MG tablet Take 10 mg by mouth at bedtime.    . nitroGLYCERIN (NITROSTAT) 0.4 MG SL tablet Place 1 tablet (0.4 mg total) under the tongue every 5 (five) minutes as needed for chest pain. 25 tablet 12  . OMEGA-3 FATTY ACIDS PO Take by mouth daily.    . OXYGEN Inhale into the lungs. 2 liters at nights    . potassium chloride SA (K-DUR,KLOR-CON) 20 MEQ tablet Take 20 mEq by mouth daily.     . ranitidine (ZANTAC) 150 MG  tablet Take 150 mg by mouth at bedtime.    . Spacer/Aero-Holding Chambers (AEROCHAMBER MV) inhaler Use as instructed 1 each 0   No current facility-administered medications for this encounter.     Physical Findings:  weight is 223 lb (101.2 kg). Her blood pressure is 143/74 (abnormal) and her pulse is 73. Her respiration is 20 and oxygen saturation is 97%. .    In general this is a well appearing caucasian female in no acute distress. She's alert and oriented x4 and appropriate throughout the examination. Cardiopulmonary assessment is negative for acute distress. Pulsating oxygen therapy 2 liters via nasal cannula noted. Dyspneic.   Lab Findings: Lab Results  Component Value Date   WBC 10.0 12/31/2015   WBC 12.3 (H) 10/07/2015   HGB 13.7 12/31/2015   HCT 42.7 12/31/2015   PLT 278 12/31/2015    Lab Results  Component Value Date   NA 146 (H) 12/31/2015   K 3.9 12/31/2015   CHLORIDE 104 12/31/2015   CO2 32 (H) 12/31/2015   GLUCOSE 111 12/31/2015   GLUCOSE 118 12/15/2011   BUN 14.7 12/31/2015   CREATININE 0.8 12/31/2015   BILITOT 0.31 12/31/2015   ALKPHOS 149 12/31/2015   AST 16 12/31/2015   ALT 22 12/31/2015   PROT 6.7 12/31/2015   ALBUMIN 3.4 (L) 12/31/2015   CALCIUM 9.4 12/31/2015   ANIONGAP 11 12/31/2015    Radiographic Findings: No results found.  Impression: Brandy Cameron is a 80 yo woman with possible recurrent non-small cell carcinoma of the left upper lung with synchronous clinical stage IA right upper lung cancer.  The patient met with Dr. Mohamed yesterday who feels she is not an ideal candidate for chemotherapy at this time. He spoke with her about the possibility for immunotherapy if her disease progresses.   Today, I talked to the patient extensively about options. I suspect she would tolerate SBRT to the right upper lung lesion and would certainly consider treating that now since we don't have molecular testing with regard to PDL-1 status. With regard to the  left lung lesion, since there is some expression of PDL-1, I may lean towards ongoing surveillance. With regard to radiation, the patient's poor pulmonary function certainly clouds our options with the understanding that treatment would reduce her pulmonary capacity to some extent. Given this risk, the patient would like to follow up with Dr. Nester and forgo radiation at this time. If indeed, she has end stage COPD, we also broached the topic of hospice enrollment.   Plan:  Follow up scheduled with Dr. Nestor and pulmonary function test on 01/05/2016. She will follow with radiation oncology as needed.   _____________________________________   A. , M.D.    This document serves as a record of services personally performed by  , MD. It was created on his behalf by   Elizabeth Ashley, a trained medical scribe. The creation of this record is based on the scribe's personal observations and the provider's statements to them. This document has been checked and approved by the attending provider.      

## 2016-01-01 NOTE — Progress Notes (Signed)
Vitals stable. Weight gain noted. Exertional dyspnea worse than in August. Pulsating oxygen therapy 2 liters via nasal cannula noted. Reports cough has resolved. Denies pain or difficulty associated with swallowing. Reports fatigue. Patient seen by Julien Nordmann yesterday she understands she will have a CT in 3 months but presently has no options from a chemo standpoint. Scheduled to follow up with [facility] on Monday and have a "breathing test." Generalized edema noted. Reports only 1 recent headache. Reports dizziness with standing.    BP (!) 143/74 (BP Location: Right Arm, Patient Position: Sitting, Cuff Size: Normal)   Pulse 73   Resp 20   Wt 223 lb (101.2 kg)   SpO2 97%   BMI 41.45 kg/m  Wt Readings from Last 3 Encounters:  01/01/16 223 lb (101.2 kg)  12/31/15 219 lb 8 oz (99.6 kg)  12/08/15 220 lb 14.4 oz (100.2 kg)

## 2016-01-04 ENCOUNTER — Other Ambulatory Visit: Payer: Self-pay | Admitting: Internal Medicine

## 2016-01-05 ENCOUNTER — Ambulatory Visit (HOSPITAL_COMMUNITY)
Admission: RE | Admit: 2016-01-05 | Discharge: 2016-01-05 | Disposition: A | Discharge status: Home / Self Care | Payer: Medicare Other | Source: Ambulatory Visit | Attending: Pulmonary Disease | Admitting: Pulmonary Disease

## 2016-01-05 ENCOUNTER — Ambulatory Visit (INDEPENDENT_AMBULATORY_CARE_PROVIDER_SITE_OTHER): Payer: Medicare Other | Admitting: Pulmonary Disease

## 2016-01-05 ENCOUNTER — Encounter: Payer: Self-pay | Admitting: Pulmonary Disease

## 2016-01-05 VITALS — BP 124/72 | HR 83 | Ht 61.5 in | Wt 216.0 lb

## 2016-01-05 DIAGNOSIS — K219 Gastro-esophageal reflux disease without esophagitis: Secondary | ICD-10-CM

## 2016-01-05 DIAGNOSIS — J449 Chronic obstructive pulmonary disease, unspecified: Secondary | ICD-10-CM | POA: Insufficient documentation

## 2016-01-05 DIAGNOSIS — Z23 Encounter for immunization: Secondary | ICD-10-CM | POA: Diagnosis not present

## 2016-01-05 DIAGNOSIS — J9611 Chronic respiratory failure with hypoxia: Secondary | ICD-10-CM | POA: Diagnosis not present

## 2016-01-05 DIAGNOSIS — R918 Other nonspecific abnormal finding of lung field: Secondary | ICD-10-CM

## 2016-01-05 DIAGNOSIS — J309 Allergic rhinitis, unspecified: Secondary | ICD-10-CM

## 2016-01-05 LAB — PULMONARY FUNCTION TEST
DL/VA % PRED: 82 %
DL/VA: 3.68 ml/min/mmHg/L
DLCO COR % PRED: 31 %
DLCO COR: 6.6 ml/min/mmHg
DLCO unc % pred: 31 %
DLCO unc: 6.66 ml/min/mmHg
FEF 25-75 POST: 0.95 L/s
FEF 25-75 Pre: 0.56 L/sec
FEF2575-%CHANGE-POST: 69 %
FEF2575-%PRED-PRE: 41 %
FEF2575-%Pred-Post: 70 %
FEV1-%Change-Post: 17 %
FEV1-%Pred-Post: 50 %
FEV1-%Pred-Pre: 43 %
FEV1-Post: 0.89 L
FEV1-Pre: 0.76 L
FEV1FVC-%CHANGE-POST: -12 %
FEV1FVC-%PRED-PRE: 99 %
FEV6-%Change-Post: 34 %
FEV6-%PRED-PRE: 45 %
FEV6-%Pred-Post: 61 %
FEV6-Post: 1.38 L
FEV6-Pre: 1.03 L
FEV6FVC-%Change-Post: 0 %
FEV6FVC-%Pred-Post: 105 %
FEV6FVC-%Pred-Pre: 105 %
FVC-%Change-Post: 34 %
FVC-%PRED-POST: 58 %
FVC-%PRED-PRE: 43 %
FVC-POST: 1.38 L
FVC-PRE: 1.03 L
PRE FEV1/FVC RATIO: 74 %
Post FEV1/FVC ratio: 65 %
Post FEV6/FVC ratio: 100 %
Pre FEV6/FVC Ratio: 100 %
RV % pred: 117 %
RV: 2.64 L
TLC % pred: 86 %
TLC: 4.05 L

## 2016-01-05 MED ORDER — ALBUTEROL SULFATE (2.5 MG/3ML) 0.083% IN NEBU
2.5000 mg | INHALATION_SOLUTION | Freq: Once | RESPIRATORY_TRACT | Status: AC
  Administered 2016-01-05: 2.5 mg via RESPIRATORY_TRACT

## 2016-01-05 NOTE — Patient Instructions (Addendum)
   You can use the Albuterol in your nebulizer every 4 hours as needed for shortness of breath, coughing, or wheezing.  When you use your nebulizer use your Atrovent followed by Perforomist and then use the Budesonide last. Then you can rinse out your nebulizer and rinse out your mouth at the end.  Once you finish the medication you have we will switch you back over to using Symbicort twice daily with your spacer. And stop the Perforomist and Pulmicort.  You will then continue taking your Atrovent (Ipratroprium Bromide) 3 times daily in your nebulizer.  Call me if you have any questions about your medication or new breathing problems.  Make sure you use your oxygen when you are walking & exerting yourself.  You can take your evening medication an hour or so earlier if you need to go to bed.  I will see you back in 2 months or sooner if needed.  Remember to get your Flu shot in 3-4 weeks from today.   TESTS ORDERED: 1. Spirometry with bronchodilator at next appointment 2. 6MWT with oxygen titration at next appointment

## 2016-01-05 NOTE — Progress Notes (Signed)
Subjective:    Patient ID: Brandy Cameron, female    DOB: Jun 16, 1935, 80 y.o.   MRN: [id number]  C.C.:  Follow-up for Bilateral Lung Nodules, Moderate-Severe COPD, Chronic Hypoxic Respiratory Failure, GERD w/ Dysphagia, & Allergic Rhinitis.  HPI  Bilateral Lung Nodules: Hypermetabolic on PET/CT imaging. Has follow-up w/ Dr. Julien Nordmann to consider immunotherapy if she indicates further signs of progression on chest CT imaging. Has follow-up with radiation oncology.   Moderate-Severe COPD:  She reports she is compliant with her nebulizer regimen of Perforomist & Budesonide twice daily along with Atrovent nebs tid. She reports she had significantly worse breathing on Thursday with palpitations and used her Symbicort which seemed to significantly help. She was unclear on her rescue medication and hasn't been using Albuterol in her nebulizer. She reports she has an intermittent cough & wheezing.   Chronic Hypoxic Respiratory Failure: Patient prescribed oxygen at 2 L/m with exertion and 2 L/m while sleeping.  She reports she hasn't been using her oxygen with exertion regularly.   GERD w/ Dysphagia: Prescribed Zantac & Nexium added to regimen at last appointment. Her reflux has improved without any morning brash water taste.    Allergic Rhinitis: Prescribed Singulair. She reports she is having intermittent sinus drainage and congestion.  Review of Systems Denies any chest pain or pressure. No fever or chills. Does have intermittent sweats. She has had intermittent headaches. She has been having some ear pain today.   Allergies  Allergen Reactions  . Sulfa Antibiotics Rash and Shortness Of Breath  . Amlodipine   . Pantoprazole Nausea And Vomiting  . Sulfonamide Derivatives Rash    Current Outpatient Prescriptions on File Prior to Visit  Medication Sig Dispense Refill  . albuterol (PROVENTIL) (2.5 MG/3ML) 0.083% nebulizer solution     . aspirin 81 MG tablet Take 81 mg by mouth daily.      Marland Kitchen  atorvastatin (LIPITOR) 20 MG tablet Take 20 mg by mouth daily.  3  . budesonide (PULMICORT) 0.25 MG/2ML nebulizer solution Take 2 mLs (0.25 mg total) by nebulization 2 (two) times daily. Dx code J44.9 120 mL 6  . budesonide-formoterol (SYMBICORT) 160-4.5 MCG/ACT inhaler Inhale 2 puffs into the lungs 2 (two) times daily.    . calcium carbonate 1250 MG capsule Take 1,250 mg by mouth 2 (two) times daily with a meal.    . Cholecalciferol (VITAMIN D3) 1000 units CAPS Take 1,000 Units by mouth daily.    . Coenzyme Q10 (CO Q-10) 100 MG CAPS Take 1 tablet by mouth daily.    . cromolyn (OPTICROM) 4 % ophthalmic solution INT 1 GTT INTO OU QID  12  . dextromethorphan-guaiFENesin (MUCINEX DM) 30-600 MG 12hr tablet Take 1 tablet by mouth 2 (two) times daily.    Marland Kitchen esomeprazole (NEXIUM) 40 MG capsule Take 1 capsule (40 mg total) by mouth daily at 12 noon. 30 capsule 3  . formoterol (PERFOROMIST) 20 MCG/2ML nebulizer solution Take 2 mLs (20 mcg total) by nebulization 2 (two) times daily. DX code J44.9 120 mL 6  . furosemide (LASIX) 40 MG tablet TAKE 1 TABLET BY MOUTH DAILY 30 tablet 1  . ipratropium (ATROVENT) 0.02 % nebulizer solution Take 2.5 mLs (0.5 mg total) by nebulization 3 (three) times daily. DX code J44.9 225 mL 6  . losartan (COZAAR) 100 MG tablet Take 1 tablet (100 mg total) by mouth daily. 30 tablet 12  . metoprolol tartrate (LOPRESSOR) 25 MG tablet TAKE 1 TABLET BY MOUTH TWICE DAILY 60 tablet  11  . montelukast (SINGULAIR) 10 MG tablet Take 10 mg by mouth at bedtime.    . nitroGLYCERIN (NITROSTAT) 0.4 MG SL tablet Place 1 tablet (0.4 mg total) under the tongue every 5 (five) minutes as needed for chest pain. 25 tablet 12  . OMEGA-3 FATTY ACIDS PO Take by mouth daily.    . OXYGEN Inhale into the lungs. 2 liters at nights    . potassium chloride SA (K-DUR,KLOR-CON) 20 MEQ tablet Take 20 mEq by mouth daily.     . ranitidine (ZANTAC) 150 MG tablet Take 150 mg by mouth at bedtime.    Marland Kitchen  Spacer/Aero-Holding Chambers (AEROCHAMBER MV) inhaler Use as instructed 1 each 0   No current facility-administered medications on file prior to visit.     Past Medical History:  Diagnosis Date  . Asthma   . CAD (coronary artery disease)    a. LHC 9/11 (in setting of NSTEMI): LAD irregs, mLCx 50, pRCA 50, inf-apical AK;  b. Myoview 9/13: Normal stress nuclear study.  LV Ejection Fraction: 71%   . Carotid stenosis    a. Carotid US 9/13: bilat 0-39%;  b. Carotid US 7/15: no sig stenosis  . Chronic diastolic CHF (congestive heart failure) ([place])    a. Echo 7/15: Mild LVH, mild focal basal septal hypertrophy, EF 50-55%, no RWMA, Gr 1 DD  . COPD (chronic obstructive pulmonary disease) ([place])   . HTN (hypertension)   . Lung cancer [facility])    [name]  Marland Kitchen NSTEMI (non-ST elevated myocardial infarction) ([place])    with only mild to moderate CAD in September of 2011,There was concern for apival ballooning. >> ? Tako-Tsubo syndrome vs vasospasm    Past Surgical History:  Procedure Laterality Date  . CATARACT SURGERY    . FOOT SURGERY    . GALLBLADDER SURGERY    . KNEE SURGERY    . LUNG LOBECTOMY    . VESICOVAGINAL FISTULA CLOSURE W/ TAH      Family History  Problem Relation Age of Onset  . Heart disease Mother 11  . Asthma Mother   . Diabetes Mother   . Cancer Mother     Salivary  . Emphysema Mother   . Heart disease      POSITIVE FAMILY HX. OF   . Pancreatitis      QUESTIONABLE FROM ACE INHIBITORS  . COPD Sister     Social History   Social History  . Marital status: Divorced    Spouse name: N/A  . Number of children: N/A  . Years of education: N/A   Social History Main Topics  . Smoking status: Former Smoker    Packs/day: 1.50    Years: 42.00    Types: Cigarettes    Quit date: 04/26/2006  . Smokeless tobacco: Never Used  . Alcohol use No  . Drug use: No  . Sexual activity: No   Other  Topics Concern  . None   Social History Narrative   Originally from [place]. Always lived in [place]. Prior travel to [facility]. 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 Has a dog at home. No birds, mold, or hot tub exposure. No indoor plants. No carpet in her home. Does have draperies.       Objective:   Physical Exam Ht 5' 1.5" (1.562 m)   Wt 216 lb (98 kg)   BMI 40.15 kg/m  General:  Awake. No distress. Sister with patient today. Obese.  Integument:  Warm & dry. No rash on exposed skin.  Lymphatics:  No appreciated cervical or supraclavicular lymphadenoapthy. HEENT:  No scleral icterus. Mild bilateral nasal turbinate swelling. Moist mucous membranes. Cardiovascular:  Regular rate. Trace edema persists. Normal S1 & S2.  Pulmonary:   Symmetrically decreased breath sounds but otherwise clear to auscultation. No accessory muscle use on room air. Abdomen:  Soft. Protuberant. Nontender.  PFT 01/05/16: FVC 1.03 L (43%) FEV1 0.76 L (43%) FEV1/FVC 0.74 FEF 25-75 0.56 L (41%) positive bronchodilator response TLC 4.05 L (86%) RV 117% ERV 15% DLCO corrected 31% (Hgb 13.7) 11/28/15: FVC 1.09 L (45%) FEV1 0.81 L (45%) FEV1/FVC 0.74 FEF 25-75 0.66 L (49%) positive bronchodilator response TLC 3.90 L (83%) RV 121% ERV 100% DLCO corrected 64% (Hgb 14.6)  6MWT 10/14/15:  Baseline Sat 90% on RA at rest / Nadir Sat 87% on RA (required 2 L/m with exertion to maintain & test stopped early due to dyspnea and chest discomfort)  IMAGING PET CT 11/11/15 (previously reviewed by me): Medial left upper lobe patchy hypermetabolic opacity with maximal dimension 4.7 cm & max SUV 11.8. Right upper lobe spiculated nodule with maximal dimension 1.6 cm with max SUV 2.8. 6 mm right lower lobe nodule noted. No other areas of hypermetabolism or adenopathy appreciated.  BARIUM SWALLOW 09/08/15 (per radiologist): Patent esophagus. No stricture or mass. Small hiatal hernia. Mildly abnormal motility. No  reflux.  MAXILLOFACIAL CT W/O 09/02/15 (per radiologist): Mild mucosal thickening inferior lateral/posterior aspect right maxillary sinus. No air-fluid levels.  CT  CHEST W/O 08/04/15 (previously reviewed by me): Volume loss with postsurgical change in left lung. Low-attenuation soft tissue density along posterior & superior aspect surgical margin. 9 mm irregular nodule right lung apex. 4 mm triangular groundglass nodule right minor fissure. 6 mm nodule right lower lobe. Or millimeter nodule right middle lobe. Additional tiny bilateral pulmonary nodules. No pleural effusion or thickening. No pathologic hilar, mediastinal, or axillary lymphadenopathy. Precarinal lymph node with a fatty hilum. Nodule in right apex is definitely new when compared with prior imaging.   CXR PA/LAT 01/01/15 (previously reviewed by me): Blunting of right posterior cardiac angle. No pleural effusion appreciated. Fullness in the left hilum that appears chronic and is likely residual scar tissue from prior surgery. No new nodule or opacity appreciated. Heart normal in size. Mediastinum otherwise normal in contour.   CT CHEST W/ 12/15/11 (previously reviewed by me): 6 mm nodule in right lower lobe that is noncalcified and rounded per radiologist present since 01/08/09 & unchanged. No pathologic mediastinal or hilar adenopathy. Soft tissue within the anterior mediastinum corresponding to fullness in the left hilum. Subcentimeter nodular density in left upper lobe reportedly unchanged. Hepatic steatosis noted.   CARDIAC EKG 10/14/15 (previously reviewed by me): Normal sinus rhythm. QTC 403 ms. Isoelectric T-wave lead V2. Artifact lead V5. Otherwise no signs of ischemia or conduction abnormality.  TTE (07/04/15): LV normal in size with EF 55%. No regional wall motion abnormalities. Grade 1 diastolic dysfunction. LA & RA normal in size. RV normal in size and function. No aortic stenosis or regurgitation. No mitral stenosis. No pulmonic  regurgitation. No significant tricuspid regurgitation. No pericardial effusion.  NUCLEAR STRESS TEST (07/03/15): Normal study with low risk. No T-wave inversion or ST segment deviation. EF 66% with hyperdynamic LV.  PATHOLOGY L Parotid Gland FNA (11/18/14): Benign fibrofatty soft tissue. No features of salivary gland present. LUL Wedge Resection (02/10/09): Invasive  adenocarcinoma 2.0 cm LUL Additional Parenchymal Margin (02/10/09): No tumor identified 4L Lymph Node (02/10/09): No tumor identified 10L Lymph Node (02/10/09): No tumor identified  MICROBIOLOGY SPUTUM CTX (09/03/15):  AFB ngtd / Fungal ngtd  LABS 08/29/15 IgE:  15 RAST Panel: [place] 0.14 / [place] 0.12 / [place] 0.15 / [place] 0.12  06/20/15 BMP: 143/4.7/99/33/14/0.71/88/9.4 BNP: 62  01/16/15 CBC: 16.7/15.0/45.6/161 ProBNP: 33  02/06/09 ABG on RA: 7.395 / 42.7 / 125 / 98.6%    Assessment & Plan:  80 y.o. female remotely diagnosed with COPD and probable recurrence of NSCLC. Patient's COPD is suboptimally controlled with the evidence of a significant bronchodilator response on her pulmonary function testing today. Symptomatically the patient did experience some improvement with the use of Symbicort this week. The cost for her current nebulizer regimen is in excess of $200 per month which is somewhat prohibitive as well. I did educate the patient on the need for oxygen therapy while exerting herself to make sure that she is having no hypoxia that could precipitate pulmonary arterial vasoconstriction. Her reflux seems to be very well controlled on her current regimen of Nexium and Zantac. I instructed the patient to contact my office if she had any further questions or concerns and spent a significant amount time today outlining the proper frequency and sequence of her nebulizer regimen.   1. Bilateral Lung Nodules:  Likely malignancy recurrence. Has f/u with Dr. Julien Nordmann & Dr. Tammi Klippel. Patient doesn't want to do XRT at  this time. 2. Moderate-Severe COPD: Switching back from Pulmicort & Perforomist to Symbicort twice daily once they run out. Continuing Atrovent tid. Patient educated on prior nebulizer regimen and frequency. 3. Chronic Hypoxic Respiratory Failure: Continuing oxygen at 2 L/m while sleeping & with exertion. Repeat 6MWT with oxygen titration at next appointment. 4. GERD w/ Dysphagia: Good Control. Continuing Nexium & Zantac as prescribed. 5. Allergic Rhinitis: Continuing Singulair. No changes at this time. 6. Immunizations: Status post Prevnar September 2016. Administering Pneumovax 23 today. Recommended Influenza Vaccine in 3-4 weeks. 7. Follow-up: Patient to return to clinic in 2 months or sooner if needed.  Sonia Baller Ashok Cordia, M.D. [facility] Pager:  [phone] After 3pm or if no response, call [phone] 2:55 PM 01/05/16

## 2016-01-05 NOTE — Addendum Note (Signed)
Addended by: Inge Rise on: 01/05/2016 03:37 PM   Modules accepted: Orders

## 2016-01-06 ENCOUNTER — Other Ambulatory Visit: Payer: Self-pay | Admitting: Internal Medicine

## 2016-01-07 ENCOUNTER — Encounter: Payer: Self-pay | Admitting: Pulmonary Disease

## 2016-01-07 ENCOUNTER — Telehealth: Payer: Self-pay | Admitting: Pulmonary Disease

## 2016-01-07 NOTE — Telephone Encounter (Signed)
Letter done & printed I believe.

## 2016-01-07 NOTE — Telephone Encounter (Signed)
Called spoke with pt. Aware letter has been signed and faxed to the # provided. Nothing further needed

## 2016-01-07 NOTE — Telephone Encounter (Signed)
Dr. Ashok Cordia, please advise once letter is done and will fax letter. thanks

## 2016-01-12 DIAGNOSIS — Z85118 Personal history of other malignant neoplasm of bronchus and lung: Secondary | ICD-10-CM | POA: Diagnosis not present

## 2016-01-12 DIAGNOSIS — R0602 Shortness of breath: Secondary | ICD-10-CM | POA: Diagnosis not present

## 2016-01-12 DIAGNOSIS — J449 Chronic obstructive pulmonary disease, unspecified: Secondary | ICD-10-CM | POA: Diagnosis not present

## 2016-01-15 DIAGNOSIS — Z85118 Personal history of other malignant neoplasm of bronchus and lung: Secondary | ICD-10-CM | POA: Diagnosis not present

## 2016-01-15 DIAGNOSIS — R0602 Shortness of breath: Secondary | ICD-10-CM | POA: Diagnosis not present

## 2016-01-15 DIAGNOSIS — J449 Chronic obstructive pulmonary disease, unspecified: Secondary | ICD-10-CM | POA: Diagnosis not present

## 2016-01-26 ENCOUNTER — Telehealth: Payer: Self-pay | Admitting: Pulmonary Disease

## 2016-01-26 ENCOUNTER — Other Ambulatory Visit: Payer: Self-pay

## 2016-01-26 DIAGNOSIS — C3412 Malignant neoplasm of upper lobe, left bronchus or lung: Secondary | ICD-10-CM

## 2016-01-26 MED ORDER — RANITIDINE HCL 150 MG PO TABS: 150.0000 mg | ORAL_TABLET | Freq: Every day | ORAL | 2 refills | Status: DC

## 2016-01-26 MED ORDER — MONTELUKAST SODIUM 10 MG PO TABS: 10.0000 mg | ORAL_TABLET | Freq: Every day | ORAL | 5 refills | Status: DC

## 2016-01-26 NOTE — Telephone Encounter (Signed)
Spoke with Silva Bandy, the pt's sister and she states pt needs refill on singulair  Rx was sent and nothing further needed

## 2016-02-03 ENCOUNTER — Encounter: Payer: Self-pay | Admitting: Internal Medicine

## 2016-02-03 ENCOUNTER — Ambulatory Visit (INDEPENDENT_AMBULATORY_CARE_PROVIDER_SITE_OTHER): Payer: Medicare Other | Admitting: Internal Medicine

## 2016-02-03 VITALS — BP 140/76 | HR 90 | Temp 98.6°F | Resp 16 | Ht 61.5 in | Wt 216.0 lb

## 2016-02-03 DIAGNOSIS — J988 Other specified respiratory disorders: Secondary | ICD-10-CM

## 2016-02-03 DIAGNOSIS — Z23 Encounter for immunization: Secondary | ICD-10-CM | POA: Diagnosis not present

## 2016-02-03 MED ORDER — AMOXICILLIN-POT CLAVULANATE 875-125 MG PO TABS: 1.0000 | ORAL_TABLET | Freq: Two times a day (BID) | ORAL | 1 refills | Status: AC

## 2016-02-03 NOTE — Patient Instructions (Signed)

## 2016-02-03 NOTE — Progress Notes (Signed)
Subjective:  Patient ID: Brandy Cameron, female    DOB: 06-19-1935  Age: 80 y.o. MRN: [id number]  CC: Cough   HPI Turquoise M Rogerson presents for a one-week history of cough that's productive of thick/yellow phlegm. She has her baseline level of shortness of breath with no recent worsening. She denies hemoptysis, chest pain, fever, chills, night sweats, or syncope.  Outpatient Medications Prior to Visit  Medication Sig Dispense Refill  . aspirin 81 MG tablet Take 81 mg by mouth daily.      Marland Kitchen atorvastatin (LIPITOR) 20 MG tablet Take 20 mg by mouth daily.  3  . budesonide (PULMICORT) 0.25 MG/2ML nebulizer solution Take 2 mLs (0.25 mg total) by nebulization 2 (two) times daily. Dx code J44.9 120 mL 6  . budesonide-formoterol (SYMBICORT) 160-4.5 MCG/ACT inhaler Inhale 2 puffs into the lungs 2 (two) times daily.    . calcium carbonate 1250 MG capsule Take 1,250 mg by mouth daily.     . Coenzyme Q10 (CO Q-10) 100 MG CAPS Take 1 tablet by mouth daily.    . cromolyn (OPTICROM) 4 % ophthalmic solution INT 1 GTT INTO OU QID  12  . dextromethorphan-guaiFENesin (MUCINEX DM) 30-600 MG 12hr tablet Take 1 tablet by mouth 2 (two) times daily.    Marland Kitchen esomeprazole (NEXIUM) 40 MG capsule Take 1 capsule (40 mg total) by mouth daily at 12 noon. 30 capsule 3  . formoterol (PERFOROMIST) 20 MCG/2ML nebulizer solution Take 2 mLs (20 mcg total) by nebulization 2 (two) times daily. DX code J44.9 120 mL 6  . furosemide (LASIX) 40 MG tablet TAKE 1 TABLET BY MOUTH DAILY 30 tablet 5  . ipratropium (ATROVENT) 0.02 % nebulizer solution Take 2.5 mLs (0.5 mg total) by nebulization 3 (three) times daily. DX code J44.9 225 mL 6  . losartan (COZAAR) 100 MG tablet Take 1 tablet (100 mg total) by mouth daily. 30 tablet 12  . metoprolol tartrate (LOPRESSOR) 25 MG tablet TAKE 1 TABLET BY MOUTH TWICE DAILY 60 tablet 11  . montelukast (SINGULAIR) 10 MG tablet Take 1 tablet (10 mg total) by mouth at bedtime. 30 tablet 5  .  nitroGLYCERIN (NITROSTAT) 0.4 MG SL tablet Place 1 tablet (0.4 mg total) under the tongue every 5 (five) minutes as needed for chest pain. 25 tablet 12  . OXYGEN Inhale into the lungs. 2 liters at nights    . potassium chloride SA (K-DUR,KLOR-CON) 20 MEQ tablet Take 20 mEq by mouth daily.     . ranitidine (ZANTAC) 150 MG tablet Take 1 tablet (150 mg total) by mouth at bedtime. 30 tablet 2  . Spacer/Aero-Holding Chambers (AEROCHAMBER MV) inhaler Use as instructed 1 each 0  . albuterol (PROVENTIL) (2.5 MG/3ML) 0.083% nebulizer solution Take 2.5 mg by nebulization every 4 (four) hours as needed.      No facility-administered medications prior to visit.     ROS Review of Systems  Constitutional: Negative for activity change, chills, diaphoresis, fatigue, fever and unexpected weight change.  HENT: Negative.  Negative for sinus pressure and trouble swallowing.   Eyes: Negative for visual disturbance.  Respiratory: Positive for cough and shortness of breath. Negative for chest tightness, wheezing and stridor.   Cardiovascular: Negative.  Negative for chest pain, palpitations and leg swelling.  Gastrointestinal: Negative.  Negative for abdominal pain, constipation, diarrhea, nausea and vomiting.  Endocrine: Negative.   Genitourinary: Negative.  Negative for difficulty urinating.  Musculoskeletal: Negative.  Negative for arthralgias, back pain, myalgias and neck pain.  Skin: Negative.  Negative for color change and rash.  Allergic/Immunologic: Negative.   Neurological: Negative.  Negative for dizziness.  Hematological: Negative.  Negative for adenopathy. Does not bruise/bleed easily.  Psychiatric/Behavioral: Negative.     Objective:  BP 140/76 (BP Location: Right Arm, Patient Position: Sitting, Cuff Size: Large)   Pulse 90   Temp 98.6 F (37 C) (Oral)   Resp 16   Ht 5' 1.5" (1.562 m)   Wt 216 lb (98 kg)   SpO2 96%   BMI 40.15 kg/m   BP Readings from Last 3 Encounters:  02/03/16 140/76   01/05/16 124/72  01/01/16 (!) 143/74    Wt Readings from Last 3 Encounters:  02/03/16 216 lb (98 kg)  01/05/16 216 lb (98 kg)  01/01/16 223 lb (101.2 kg)    Physical Exam  Constitutional: She is oriented to person, place, and time. No distress.  HENT:  Mouth/Throat: Oropharynx is clear and moist. No oropharyngeal exudate.  Eyes: Conjunctivae are normal. Right eye exhibits no discharge. Left eye exhibits no discharge. No scleral icterus.  Neck: Normal range of motion. Neck supple. No JVD present. No tracheal deviation present. No thyromegaly present.  Cardiovascular: Normal rate, regular rhythm, normal heart sounds and intact distal pulses.  Exam reveals no gallop and no friction rub.   No murmur heard. Pulmonary/Chest: Effort normal. No accessory muscle usage or stridor. No tachypnea. No respiratory distress. She has no decreased breath sounds. She has no wheezes. She has rhonchi in the right middle field and the left middle field. She has no rales. She exhibits no tenderness.  Abdominal: Soft. Bowel sounds are normal. She exhibits no distension and no mass. There is no tenderness. There is no rebound and no guarding.  Musculoskeletal: Normal range of motion. She exhibits no edema, tenderness or deformity.  Lymphadenopathy:    She has no cervical adenopathy.  Neurological: She is oriented to person, place, and time.  Skin: Skin is warm and dry. No rash noted. She is not diaphoretic. No erythema. No pallor.  Vitals reviewed.   Lab Results  Component Value Date   WBC 10.0 12/31/2015   HGB 13.7 12/31/2015   HCT 42.7 12/31/2015   PLT 278 12/31/2015   GLUCOSE 111 12/31/2015   CHOL 135 10/07/2015   TRIG 189.0 (H) 10/07/2015   HDL 43.80 10/07/2015   LDLCALC 53 10/07/2015   ALT 22 12/31/2015   AST 16 12/31/2015   NA 146 (H) 12/31/2015   K 3.9 12/31/2015   CL 103 10/07/2015   CREATININE 0.8 12/31/2015   BUN 14.7 12/31/2015   CO2 32 (H) 12/31/2015   TSH 1.90 10/07/2015   INR  1.00 11/22/2014   HGBA1C 5.8 10/07/2015    No results found.  Assessment & Plan:   Brystal was seen today for cough.  Diagnoses and all orders for this visit:  RTI (respiratory tract infection)- she has signs and symptoms of a mild respiratory infection, I don't think she has pneumonia but will empirically treat with Augmentin. -     amoxicillin-clavulanate (AUGMENTIN) 875-125 MG tablet; Take 1 tablet by mouth 2 (two) times daily.  Need for prophylactic vaccination and inoculation against influenza -     Flu vaccine HIGH DOSE PF (Fluzone High dose)  Need for prophylactic vaccination with combined diphtheria-tetanus-pertussis (DTP) vaccine -     Tdap vaccine greater than or equal to 7yo IM   I have discontinued Ms. Hagood's albuterol. I am also having her start on amoxicillin-clavulanate. Additionally,  I am having her maintain her aspirin, potassium chloride SA, losartan, budesonide-formoterol, nitroGLYCERIN, Co Q-10, atorvastatin, AEROCHAMBER MV, OXYGEN, cromolyn, dextromethorphan-guaiFENesin, calcium carbonate, formoterol, budesonide, ipratropium, esomeprazole, metoprolol tartrate, furosemide, ranitidine, montelukast, and fluorometholone.  Meds ordered this encounter  Medications  . fluorometholone (FML) 0.1 % ophthalmic suspension    Sig: SHAKE LQ AND INT 1 GTT IN OU QID    Refill:  1  . amoxicillin-clavulanate (AUGMENTIN) 875-125 MG tablet    Sig: Take 1 tablet by mouth 2 (two) times daily.    Dispense:  20 tablet    Refill:  1     Follow-up: Return in about 3 weeks (around 02/24/2016).  [name], MD

## 2016-02-03 NOTE — Progress Notes (Signed)
fluPre visit review using our clinic review tool, if applicable. No additional management support is needed unless otherwise documented below in the visit note.

## 2016-02-06 ENCOUNTER — Telehealth: Payer: Self-pay | Admitting: Internal Medicine

## 2016-02-06 ENCOUNTER — Emergency Department (HOSPITAL_COMMUNITY): Payer: Medicare Other

## 2016-02-06 ENCOUNTER — Emergency Department (HOSPITAL_COMMUNITY)
Admission: EM | Admit: 2016-02-06 | Discharge: 2016-02-06 | Disposition: A | Discharge status: Home / Self Care | Payer: Medicare Other | Attending: Emergency Medicine | Admitting: Emergency Medicine

## 2016-02-06 ENCOUNTER — Encounter (HOSPITAL_COMMUNITY): Payer: Self-pay

## 2016-02-06 DIAGNOSIS — R0600 Dyspnea, unspecified: Secondary | ICD-10-CM | POA: Diagnosis not present

## 2016-02-06 DIAGNOSIS — J449 Chronic obstructive pulmonary disease, unspecified: Secondary | ICD-10-CM | POA: Insufficient documentation

## 2016-02-06 DIAGNOSIS — Z85118 Personal history of other malignant neoplasm of bronchus and lung: Secondary | ICD-10-CM | POA: Insufficient documentation

## 2016-02-06 DIAGNOSIS — R1031 Right lower quadrant pain: Secondary | ICD-10-CM | POA: Diagnosis not present

## 2016-02-06 DIAGNOSIS — I110 Hypertensive heart disease with heart failure: Secondary | ICD-10-CM | POA: Insufficient documentation

## 2016-02-06 DIAGNOSIS — Z7982 Long term (current) use of aspirin: Secondary | ICD-10-CM | POA: Insufficient documentation

## 2016-02-06 DIAGNOSIS — I252 Old myocardial infarction: Secondary | ICD-10-CM | POA: Diagnosis not present

## 2016-02-06 DIAGNOSIS — I5033 Acute on chronic diastolic (congestive) heart failure: Secondary | ICD-10-CM | POA: Diagnosis not present

## 2016-02-06 DIAGNOSIS — K5790 Diverticulosis of intestine, part unspecified, without perforation or abscess without bleeding: Secondary | ICD-10-CM | POA: Diagnosis not present

## 2016-02-06 DIAGNOSIS — Z87891 Personal history of nicotine dependence: Secondary | ICD-10-CM | POA: Diagnosis not present

## 2016-02-06 DIAGNOSIS — I2510 Atherosclerotic heart disease of native coronary artery without angina pectoris: Secondary | ICD-10-CM | POA: Diagnosis not present

## 2016-02-06 DIAGNOSIS — R197 Diarrhea, unspecified: Secondary | ICD-10-CM | POA: Insufficient documentation

## 2016-02-06 DIAGNOSIS — R05 Cough: Secondary | ICD-10-CM | POA: Diagnosis not present

## 2016-02-06 LAB — COMPREHENSIVE METABOLIC PANEL
ALT: 23 U/L (ref 14–54)
AST: 21 U/L (ref 15–41)
Albumin: 3.6 g/dL (ref 3.5–5.0)
Alkaline Phosphatase: 123 U/L (ref 38–126)
Anion gap: 7 (ref 5–15)
BUN: 12 mg/dL (ref 6–20)
CALCIUM: 9.4 mg/dL (ref 8.9–10.3)
CHLORIDE: 105 mmol/L (ref 101–111)
CO2: 31 mmol/L (ref 22–32)
CREATININE: 0.99 mg/dL (ref 0.44–1.00)
GFR, EST NON AFRICAN AMERICAN: 53 mL/min — AB (ref >60)
Glucose, Bld: 132 mg/dL — ABNORMAL HIGH (ref 65–99)
Potassium: 4.5 mmol/L (ref 3.5–5.1)
SODIUM: 143 mmol/L (ref 135–145)
Total Bilirubin: 0.1 mg/dL — ABNORMAL LOW (ref 0.3–1.2)
Total Protein: 6.3 g/dL — ABNORMAL LOW (ref 6.5–8.1)

## 2016-02-06 LAB — URINALYSIS, ROUTINE W REFLEX MICROSCOPIC
Bilirubin Urine: NEGATIVE
Glucose, UA: NEGATIVE mg/dL
Ketones, ur: NEGATIVE mg/dL
LEUKOCYTES UA: NEGATIVE
Nitrite: NEGATIVE
PROTEIN: 100 mg/dL — AB
SPECIFIC GRAVITY, URINE: 1.02 (ref 1.005–1.030)
pH: 7 (ref 5.0–8.0)

## 2016-02-06 LAB — URINE MICROSCOPIC-ADD ON

## 2016-02-06 LAB — C DIFFICILE QUICK SCREEN W PCR REFLEX
C DIFFICILE (CDIFF) INTERP: NOT DETECTED
C DIFFICILE (CDIFF) TOXIN: NEGATIVE
C DIFFICLE (CDIFF) ANTIGEN: NEGATIVE

## 2016-02-06 LAB — BRAIN NATRIURETIC PEPTIDE: B Natriuretic Peptide: 32 pg/mL (ref 0.0–100.0)

## 2016-02-06 LAB — CBC
HCT: 42.8 % (ref 36.0–46.0)
HEMOGLOBIN: 13.2 g/dL (ref 12.0–15.0)
MCH: 28.4 pg (ref 26.0–34.0)
MCHC: 30.8 g/dL (ref 30.0–36.0)
MCV: 92.2 fL (ref 78.0–100.0)
PLATELETS: 272 10*3/uL (ref 150–400)
RBC: 4.64 MIL/uL (ref 3.87–5.11)
RDW: 14.5 % (ref 11.5–15.5)
WBC: 8.5 10*3/uL (ref 4.0–10.5)

## 2016-02-06 LAB — I-STAT TROPONIN, ED: Troponin i, poc: 0.01 ng/mL (ref 0.00–0.08)

## 2016-02-06 LAB — POC OCCULT BLOOD, ED: FECAL OCCULT BLD: NEGATIVE

## 2016-02-06 LAB — LIPASE, BLOOD: LIPASE: 28 U/L (ref 11–51)

## 2016-02-06 MED ORDER — SODIUM CHLORIDE 0.9 % IV BOLUS (SEPSIS)
1000.0000 mL | Freq: Once | INTRAVENOUS | Status: AC
  Administered 2016-02-06: 1000 mL via INTRAVENOUS

## 2016-02-06 MED ORDER — IOPAMIDOL (ISOVUE-300) INJECTION 61%
INTRAVENOUS | Status: AC
  Administered 2016-02-06: 100 mL
  Filled 2016-02-06: qty 100

## 2016-02-06 MED ORDER — IPRATROPIUM-ALBUTEROL 0.5-2.5 (3) MG/3ML IN SOLN
3.0000 mL | Freq: Once | RESPIRATORY_TRACT | Status: AC
  Administered 2016-02-06: 3 mL via RESPIRATORY_TRACT
  Filled 2016-02-06: qty 3

## 2016-02-06 NOTE — Discharge Instructions (Signed)
You can stop the antibiotic that you are taking to see if your diarrhea gets better.  Return without fail for worsening symptoms, including fever, difficulty breathing, passing out, chest pain, confusion, intractable vomiting, concern for dehydration or any other symptoms concerning to you.  Please follow-up with you doctor in the next few days to make sure your symptoms are improving.

## 2016-02-06 NOTE — ED Notes (Signed)
Pt has a brown formed stool no diarrhea

## 2016-02-06 NOTE — ED Triage Notes (Signed)
Per pT, Pt is coming from home with complaints of diarrhea secondary to antibiotic use due to a cold. Pt reports diarrhea "looking like coffee grounds." Complains of abdominal pain, and diarrhea with urination.

## 2016-02-06 NOTE — ED Provider Notes (Signed)
[facility] DEPT Provider Note   CSN: [id number] Arrival date & time: 02/06/16  1146     History   Chief Complaint Chief Complaint  Patient presents with  . Diarrhea    HPI Brandy Cameron is a 80 y.o. female.  HPI 80 year old female who presents with diarrhea. She has a history of COPD and lung cancer on 2 L of oxygen chronically. States that she was recently started on Augmentin by her doctor she had developed mild cough with sputum. She has not had fevers, chills, chest pain, severe difficulty breathing. States that after starting her antibiotics, she developed diarrhea, has had too many to count episodes including 30 she was in the waiting room. She stopped taking her antibiotics this morning, While here in the ED states that her diarrhea seems to become being more solid. Has had some right lower quadrant abdominal pain. No nausea or vomiting, dysuria, urinary frequency, hematuria, syncope or near syncope. Pain cramping, intermittent, nonradiating, and she has not had similar symptoms before. Has not tried anything for her symptoms. What brought her to the ED was that she noted that her diarrhea was in the consistency of coffee grounds. She spoke with her doctor's nurse about this and was sent to the ED for evaluation.   Past Medical History:  Diagnosis Date  . Asthma   . CAD (coronary artery disease)    a. LHC 9/11 (in setting of NSTEMI): LAD irregs, mLCx 50, pRCA 50, inf-apical AK;  b. Myoview 9/13: Normal stress nuclear study.  LV Ejection Fraction: 71%   . Carotid stenosis    a. Carotid US 9/13: bilat 0-39%;  b. Carotid US 7/15: no sig stenosis  . Chronic diastolic CHF (congestive heart failure) ([place])    a. Echo 7/15: Mild LVH, mild focal basal septal hypertrophy, EF 50-55%, no RWMA, Gr 1 DD  . COPD (chronic obstructive pulmonary disease) ([place])   . HTN (hypertension)   . Lung cancer [facility])    She has had a LUL VATS for lung cancer and radioactive seed implantation on  October of 2010.  Marland Kitchen NSTEMI (non-ST elevated myocardial infarction) ([place])    with only mild to moderate CAD in September of 2011,There was concern for apival ballooning. >> ? Tako-Tsubo syndrome vs vasospasm    Patient Active Problem List   Diagnosis Date Noted  . RTI (respiratory tract infection) 02/03/2016  . Chronic respiratory failure ([place]) 10/14/2015  . Hyperglycemia 10/07/2015  . Menopause ovarian failure 10/07/2015  . Encounter for colorectal cancer screening 10/07/2015  . COPD (chronic obstructive pulmonary disease) ([place]) 08/29/2015  . GERD (gastroesophageal reflux disease) 08/29/2015  . Multiple lung nodules on CT 08/04/2015  . Parotid mass   . Acute diastolic congestive heart failure ([place]) 10/29/2013  . Cerebrovascular disease 08/08/2013  . Bruit 12/01/2011  . ASTHMA 12/26/2010  . CAD (coronary artery disease) 11/11/2010  . Hypertension 11/11/2010  . Hyperlipidemia with target LDL less than 70 11/11/2010  . Primary cancer of left upper lobe of lung ([place]) 11/11/2010    Past Surgical History:  Procedure Laterality Date  . CATARACT SURGERY    . FOOT SURGERY    . GALLBLADDER SURGERY    . KNEE SURGERY    . LUNG LOBECTOMY    . VESICOVAGINAL FISTULA CLOSURE W/ TAH      OB History    No data available       Home Medications    Prior to Admission medications   Medication Sig Start Date End  Date Taking? Authorizing Provider  amoxicillin-clavulanate (AUGMENTIN) 875-125 MG tablet Take 1 tablet by mouth 2 (two) times daily. 02/03/16 02/13/16  [name], MD  aspirin 81 MG tablet Take 81 mg by mouth daily.      Historical Provider, MD  atorvastatin (LIPITOR) 20 MG tablet Take 20 mg by mouth daily. 05/21/15   Historical Provider, MD  budesonide (PULMICORT) 0.25 MG/2ML nebulizer solution Take 2 mLs (0.25 mg total) by nebulization 2 (two) times daily. Dx code J44.9 11/28/15   [name], MD  budesonide-formoterol [facility]) 160-4.5 MCG/ACT inhaler Inhale 2 puffs  into the lungs 2 (two) times daily.    Historical Provider, MD  calcium carbonate 1250 MG capsule Take 1,250 mg by mouth daily.     Historical Provider, MD  Coenzyme Q10 (CO Q-10) 100 MG CAPS Take 1 tablet by mouth daily.    Historical Provider, MD  cromolyn (OPTICROM) 4 % ophthalmic solution INT 1 GTT INTO OU QID 09/04/15   Historical Provider, MD  dextromethorphan-guaiFENesin (MUCINEX DM) 30-600 MG 12hr tablet Take 1 tablet by mouth 2 (two) times daily.    Historical Provider, MD  esomeprazole (NEXIUM) 40 MG capsule Take 1 capsule (40 mg total) by mouth daily at 12 noon. 11/28/15   [name], MD  fluorometholone (FML) 0.1 % ophthalmic suspension SHAKE LQ AND INT 1 GTT IN OU QID 01/19/16   Historical Provider, MD  formoterol (PERFOROMIST) 20 MCG/2ML nebulizer solution Take 2 mLs (20 mcg total) by nebulization 2 (two) times daily. DX code J44.9 11/28/15   [name], MD  furosemide (LASIX) 40 MG tablet TAKE 1 TABLET BY MOUTH DAILY 01/07/16   [name], MD  ipratropium (ATROVENT) 0.02 % nebulizer solution Take 2.5 mLs (0.5 mg total) by nebulization 3 (three) times daily. DX code J44.9 11/28/15   [name], MD  losartan (COZAAR) 100 MG tablet Take 1 tablet (100 mg total) by mouth daily. 11/30/11   Lelon Perla, MD  metoprolol tartrate (LOPRESSOR) 25 MG tablet TAKE 1 TABLET BY MOUTH TWICE DAILY 01/05/16   [name], MD  montelukast (SINGULAIR) 10 MG tablet Take 1 tablet (10 mg total) by mouth at bedtime. 01/26/16   [name], MD  nitroGLYCERIN (NITROSTAT) 0.4 MG SL tablet Place 1 tablet (0.4 mg total) under the tongue every 5 (five) minutes as needed for chest pain. 12/18/13   Lelon Perla, MD  OXYGEN Inhale into the lungs. 2 liters at nights    Historical Provider, MD  potassium chloride SA (K-DUR,KLOR-CON) 20 MEQ tablet Take 20 mEq by mouth daily.     Historical Provider, MD  ranitidine (ZANTAC) 150 MG tablet Take 1 tablet (150 mg total) by mouth at bedtime. 01/26/16    [name], MD  Spacer/Aero-Holding Chambers (AEROCHAMBER MV) inhaler Use as instructed 08/29/15   [name], MD    Family History Family History  Problem Relation Age of Onset  . Heart disease Mother 43  . Asthma Mother   . Diabetes Mother   . Cancer Mother     Salivary  . Emphysema Mother   . Heart disease      POSITIVE FAMILY HX. OF   . Pancreatitis      QUESTIONABLE FROM ACE INHIBITORS  . COPD Sister     Social History Social History  Substance Use Topics  . Smoking status: Former Smoker    Packs/day: 1.50    Years: 42.00    Types:  Cigarettes    Quit date: 04/26/2006  . Smokeless tobacco: Never Used  . Alcohol use No     Allergies   Sulfa antibiotics; Amlodipine; Pantoprazole; and Sulfonamide derivatives   Review of Systems Review of Systems 10/14 systems reviewed and are negative other than those stated in the HPI   Physical Exam Updated Vital Signs BP 126/62   Pulse 80   Temp 98 F (36.7 C) (Oral)   Resp (!) 29   SpO2 97%   Physical Exam Physical Exam  Nursing note and vitals reviewed. Constitutional: non-toxic, and in no acute distress Head: Normocephalic and atraumatic.  Mouth/Throat: Oropharynx is clear and dry.  Neck: Normal range of motion. Neck supple.  Cardiovascular: Normal rate and regular rhythm. No edema  Pulmonary/Chest: Effort normal and breath sounds with occasional wheeze.  Abdominal: Soft. There is RLQ tenderness. There is no rebound and no guarding.  Musculoskeletal: Normal range of motion.  Neurological: Alert, no facial droop, fluent speech, moves all extremities symmetrically Skin: Skin is warm and dry.  Psychiatric: Cooperative   ED Treatments / Results  Labs (all labs ordered are listed, but only abnormal results are displayed) Labs Reviewed  COMPREHENSIVE METABOLIC PANEL - Abnormal; Notable for the following:       Result Value   Glucose, Bld 132 (*)    Total Protein 6.3 (*)    Total Bilirubin 0.1 (*)     GFR calc non Af Amer 53 (*)    All other components within normal limits  URINALYSIS, ROUTINE W REFLEX MICROSCOPIC (NOT AT [facility]) - Abnormal; Notable for the following:    Hgb urine dipstick SMALL (*)    Protein, ur 100 (*)    All other components within normal limits  URINE MICROSCOPIC-ADD ON - Abnormal; Notable for the following:    Squamous Epithelial / LPF 0-5 (*)    Bacteria, UA RARE (*)    All other components within normal limits  C DIFFICILE QUICK SCREEN W PCR REFLEX  GASTROINTESTINAL PANEL BY PCR, STOOL (REPLACES STOOL CULTURE)  LIPASE, BLOOD  CBC  BRAIN NATRIURETIC PEPTIDE  POC OCCULT BLOOD, ED  I-STAT TROPOININ, ED  TYPE AND SCREEN    EKG  EKG Interpretation None       Radiology Dg Chest 2 View  Result Date: 02/06/2016 CLINICAL DATA:  Cough.  History of lung cancer EXAM: CHEST  2 VIEW COMPARISON:  01/01/2015 FINDINGS: Postop changes left upper lobe are stable. No recurrent mass. Negative for pneumonia. No pleural effusion. Pulmonary hyperinflation and COPD. Negative for heart failure. IMPRESSION: Postop changes left upper lobe are stable. No superimposed acute abnormality. Electronically Signed   By: Franchot Gallo M.D.   On: 02/06/2016 19:40   Ct Abdomen Pelvis W Contrast  Result Date: 02/06/2016 CLINICAL DATA:  Right lower quadrant pain with diarrhea. History lung cancer. EXAM: CT ABDOMEN AND PELVIS WITH CONTRAST TECHNIQUE: Multidetector CT imaging of the abdomen and pelvis was performed using the standard protocol following bolus administration of intravenous contrast. CONTRAST:  100 ISOVUE-300 IOPAMIDOL (ISOVUE-300) INJECTION 61% COMPARISON:  CT abdomen pelvis 08/29/2013 FINDINGS: Lower chest: Lung bases clear without infiltrate or effusion. 6 mm nodule right lower lobe stable. This was also present in 2010 and is stable. Hepatobiliary: Gallbladder surgically absent. No liver lesion. Bile ducts nondilated. Pancreas: Negative for Spleen: Negative Adrenals/Urinary  Tract: Small bilateral renal cysts. No renal mass or obstruction. No urinary tract calculi. Urinary bladder normal. Stomach/Bowel: Small hiatal hernia. Stomach and duodenum normal. Negative for bowel  obstruction. No bowel edema. Normal appendix. Sigmoid diverticulosis without diverticulitis. Vascular/Lymphatic: Mild atherosclerotic disease. No aortic aneurysm. Negative for adenopathy Reproductive: Hysterectomy.  No pelvic mass. Other: No free fluid.  Negative for hernia. Musculoskeletal: Mild lumbar degenerative change. No acute skeletal abnormality. IMPRESSION: No acute abnormality. Normal appendix.  Sigmoid diverticulosis without diverticulitis. Electronically Signed   By: Franchot Gallo M.D.   On: 02/06/2016 19:52    Procedures Procedures (including critical care time)  Medications Ordered in ED Medications  sodium chloride 0.9 % bolus 1,000 mL (1,000 mLs Intravenous New Bag/Given 02/06/16 1859)  ipratropium-albuterol (DUONEB) 0.5-2.5 (3) MG/3ML nebulizer solution 3 mL (3 mLs Nebulization Given 02/06/16 1947)  iopamidol (ISOVUE-300) 61 % injection (100 mLs  Contrast Given 02/06/16 1922)     Initial Impression / Assessment and Plan / ED Course  I have reviewed the triage vital signs and the nursing notes.  Pertinent labs & imaging results that were available during my care of the patient were reviewed by me and considered in my medical decision making (see chart for details).  Clinical Course    Presenting with diarrhea after recent restarting Augmentin. Is nontoxic in no acute distress. She does appear to have some mild conversational dyspnea, but she states that this is normal for her. Overall soft and nonsurgical abdomen but with some right lower quadrant tenderness to palpation. I suspect that her diarrhea is likely a side effect of her antibiotics. I will attempt to send stool sample for C. difficile testing, but she has not had any further diarrhea while here and her ED room.  Differential also includes potential appendicitis, colitis, or other intra-abdominal process in the setting of her right lower quadrant abdominal pain. CT abdomen and pelvis performed and does not show any acute intra-abdominal processes. Her urine is clean. There is no evidence of GI bleeding, with normal hemoglobin, guaiac negative stool. She also has no major metabolic or electrolyte derangements. Her chest x-ray does not show infiltrate. Since there is no clear evidence of pneumonia currently is no leukocytosis, fever, infiltrate she will discontinue her Augmentin. She'll follow up closely with her primary care doctor. This time I feel that she is stable for discharge home for continued supportive management.  The patient appears reasonably screened and/or stabilized for discharge and I doubt any other medical condition or other [facility] requiring further screening, evaluation, or treatment in the ED at this time prior to discharge.  Strict return and follow-up instructions reviewed. She expressed understanding of all discharge instructions and felt comfortable with the plan of care.   Final Clinical Impressions(s) / ED Diagnoses   Final diagnoses:  Diarrhea, unspecified type    New Prescriptions New Prescriptions   No medications on file     Forde Dandy, MD 02/06/16 2040

## 2016-02-06 NOTE — ED Notes (Signed)
Unable to  Get stool sample  Too much paper attached

## 2016-02-06 NOTE — Telephone Encounter (Signed)
[facility]  Patient Name: RUTHMARY Barkalow  DOB: Apr 06, 1936    Initial Comment Caller states she had tetanus and flu shots Tues and prescribed amox-clav, having diarrhea that looks like coffee grounds   Nurse Assessment  Nurse: Wynetta Emery, RN, Baker Janus Date/Time (Eastern Time): 02/06/2016 9:20:49 AM  Confirm and document reason for call. If symptomatic, describe symptoms. You must click the next button to save text entered. ---Sopheap seen in office on Tuesday (received flu and tetantus) and also given amox/clav (augmentin '875mg'$ ) and started diarrhea that looks like coffee ground material that (started yesterday) abdominal (lower) pain  Has the patient traveled out of the country within the last 30 days? ---No  Does the patient have any new or worsening symptoms? ---Yes  Will a triage be completed? ---Yes  Related visit to physician within the last 2 weeks? ---Yes  Does the PT have any chronic conditions? (i.e. diabetes, asthma, etc.) ---Unknown  Is this a behavioral health or substance abuse call? ---No     Guidelines    Guideline Title Affirmed Question Affirmed Notes  Diarrhea Black or tarry bowel movements (Exception: chronic-unchanged black-grey bowel movements AND is taking iron pills or Pepto-Bismol)    Final Disposition User   Go to ED Now Wynetta Emery, RN, Baker Janus    Comments  FYI having diarrhea with coffee ground consistency and dark black in color. Has been on augmentin but due to the severity and possible blood sending to ED at this time.   Referrals  [facility] - ED   Disagree/Comply: Comply

## 2016-02-07 LAB — GASTROINTESTINAL PANEL BY PCR, STOOL (REPLACES STOOL CULTURE)

## 2016-02-07 LAB — TYPE AND SCREEN
ABO/RH(D): O POS
Antibody Screen: NEGATIVE

## 2016-02-11 DIAGNOSIS — Z85118 Personal history of other malignant neoplasm of bronchus and lung: Secondary | ICD-10-CM | POA: Diagnosis not present

## 2016-02-11 DIAGNOSIS — R0602 Shortness of breath: Secondary | ICD-10-CM | POA: Diagnosis not present

## 2016-02-11 DIAGNOSIS — J449 Chronic obstructive pulmonary disease, unspecified: Secondary | ICD-10-CM | POA: Diagnosis not present

## 2016-02-14 DIAGNOSIS — R0602 Shortness of breath: Secondary | ICD-10-CM | POA: Diagnosis not present

## 2016-02-14 DIAGNOSIS — J449 Chronic obstructive pulmonary disease, unspecified: Secondary | ICD-10-CM | POA: Diagnosis not present

## 2016-02-14 DIAGNOSIS — Z85118 Personal history of other malignant neoplasm of bronchus and lung: Secondary | ICD-10-CM | POA: Diagnosis not present

## 2016-02-17 ENCOUNTER — Telehealth: Payer: Self-pay | Admitting: Pulmonary Disease

## 2016-02-17 NOTE — Telephone Encounter (Signed)
Spoke with the pt  She is c/o back pain x 3 days  She states that she "tried exercising" a few days ago "but I know it's not from that" She states her breathing is not doing as well as it was on her last visit  I offered appt with TP for tomorrow, and she refused, stating she only wants to see Dr Ashok Cordia  She states she already has appt with her PCP on 02/23/16 and will just see him then  I advised to call for appt with NP here if she changes her mind

## 2016-02-18 ENCOUNTER — Ambulatory Visit (HOSPITAL_COMMUNITY)
Admission: EM | Admit: 2016-02-18 | Discharge: 2016-02-18 | Disposition: A | Discharge status: Home / Self Care | Payer: Medicare Other

## 2016-02-20 ENCOUNTER — Emergency Department (INDEPENDENT_AMBULATORY_CARE_PROVIDER_SITE_OTHER): Payer: Medicare Other

## 2016-02-20 ENCOUNTER — Encounter: Payer: Self-pay | Admitting: Emergency Medicine

## 2016-02-20 ENCOUNTER — Emergency Department
Admission: EM | Admit: 2016-02-20 | Discharge: 2016-02-20 | Disposition: A | Discharge status: Home / Self Care | Payer: Medicare Other | Source: Home / Self Care | Attending: Family Medicine | Admitting: Family Medicine

## 2016-02-20 DIAGNOSIS — M549 Dorsalgia, unspecified: Secondary | ICD-10-CM | POA: Diagnosis not present

## 2016-02-20 DIAGNOSIS — M546 Pain in thoracic spine: Secondary | ICD-10-CM

## 2016-02-20 MED ORDER — CYCLOBENZAPRINE HCL 5 MG PO TABS: ORAL_TABLET | ORAL | 0 refills | Status: DC

## 2016-02-20 MED ORDER — METHYLPREDNISOLONE SODIUM SUCC 125 MG IJ SOLR
80.0000 mg | Freq: Once | INTRAMUSCULAR | Status: AC
  Administered 2016-02-20: 80 mg via INTRAMUSCULAR

## 2016-02-20 MED ORDER — ACETAMINOPHEN 325 MG PO TABS
975.0000 mg | ORAL_TABLET | Freq: Once | ORAL | Status: AC
  Administered 2016-02-20: 975 mg via ORAL

## 2016-02-20 MED ORDER — PREDNISONE 20 MG PO TABS: ORAL_TABLET | ORAL | 0 refills | Status: DC

## 2016-02-20 NOTE — Discharge Instructions (Signed)
Begin prednisone Saturday 02/21/16. Apply ice pack for 20 to 30 minutes, 3 to 4 times daily  Continue until pain decreases.  Avoid using back exercise machine.  Resume other exercises as tolerated.

## 2016-02-20 NOTE — ED Provider Notes (Signed)
[facility]    CSN: [id number] Arrival date & time: 02/20/16  5643     History   Chief Complaint Chief Complaint  Patient presents with  . Back Pain    HPI Brandy Cameron is a 80 y.o. female.   Patient began exercising at a gym about 3 weeks ago [employer], site").  Two weeks ago she began using a back extension machine.  After several sessions, she began developing mid-back pain which has become significantly worse during the past week.  She has posterior pain with certain movements, and sometimes inspiration.  She describes the pain as a "catch."  She is having difficulty sleeping.  The pain has not responded to a heating pad.   She denies bowel or bladder dysfunction, and no saddle numbness.    The history is provided by the patient and a friend.  Back Pain  Location:  Thoracic spine Quality:  Stabbing Radiates to:  Does not radiate Pain severity:  Moderate Pain is:  Same all the time Onset quality:  Gradual Duration:  2 weeks Timing:  Intermittent Progression:  Worsening Chronicity:  New Context comment:  Weight lifting machine Relieved by:  Nothing Worsened by:  Coughing and movement Ineffective treatments:  Heating pad Associated symptoms: no abdominal pain, no chest pain, no fever, no leg pain and no paresthesias   Risk factors: obesity     Past Medical History:  Diagnosis Date  . Asthma   . CAD (coronary artery disease)    a. LHC 9/11 (in setting of NSTEMI): LAD irregs, mLCx 50, pRCA 50, inf-apical AK;  b. Myoview 9/13: Normal stress nuclear study.  LV Ejection Fraction: 71%   . Carotid stenosis    a. Carotid US 9/13: bilat 0-39%;  b. Carotid US 7/15: no sig stenosis  . Chronic diastolic CHF (congestive heart failure) ([place])    a. Echo 7/15: Mild LVH, mild focal basal septal hypertrophy, EF 50-55%, no RWMA, Gr 1 DD  . COPD (chronic obstructive pulmonary disease) ([place])   . HTN (hypertension)   . Lung cancer [facility])    She has had a LUL VATS  for lung cancer and radioactive seed implantation on October of 2010.  Marland Kitchen NSTEMI (non-ST elevated myocardial infarction) ([place])    with only mild to moderate CAD in September of 2011,There was concern for apival ballooning. >> ? Tako-Tsubo syndrome vs vasospasm    Patient Active Problem List   Diagnosis Date Noted  . RTI (respiratory tract infection) 02/03/2016  . Chronic respiratory failure ([place]) 10/14/2015  . Hyperglycemia 10/07/2015  . Menopause ovarian failure 10/07/2015  . Encounter for colorectal cancer screening 10/07/2015  . COPD (chronic obstructive pulmonary disease) ([place]) 08/29/2015  . GERD (gastroesophageal reflux disease) 08/29/2015  . Multiple lung nodules on CT 08/04/2015  . Parotid mass   . Acute diastolic congestive heart failure ([place]) 10/29/2013  . Cerebrovascular disease 08/08/2013  . Bruit 12/01/2011  . ASTHMA 12/26/2010  . CAD (coronary artery disease) 11/11/2010  . Hypertension 11/11/2010  . Hyperlipidemia with target LDL less than 70 11/11/2010  . Primary cancer of left upper lobe of lung ([place]) 11/11/2010    Past Surgical History:  Procedure Laterality Date  . CATARACT SURGERY    . FOOT SURGERY    . GALLBLADDER SURGERY    . KNEE SURGERY    . LUNG LOBECTOMY    . VESICOVAGINAL FISTULA CLOSURE W/ TAH      OB History    No data available  Home Medications    Prior to Admission medications   Medication Sig Start Date End Date Taking? Authorizing Provider  aspirin 81 MG tablet Take 81 mg by mouth daily.      Historical Provider, MD  atorvastatin (LIPITOR) 20 MG tablet Take 20 mg by mouth daily. 05/21/15   Historical Provider, MD  budesonide (PULMICORT) 0.25 MG/2ML nebulizer solution Take 2 mLs (0.25 mg total) by nebulization 2 (two) times daily. Dx code J44.9 11/28/15   [name], MD  budesonide-formoterol [facility]) 160-4.5 MCG/ACT inhaler Inhale 2 puffs into the lungs 2 (two) times daily.    Historical Provider, MD  calcium carbonate 1250  MG capsule Take 1,250 mg by mouth daily.     Historical Provider, MD  Coenzyme Q10 (CO Q-10) 100 MG CAPS Take 1 tablet by mouth daily.    Historical Provider, MD  cromolyn (OPTICROM) 4 % ophthalmic solution INT 1 GTT INTO OU QID 09/04/15   Historical Provider, MD  cyclobenzaprine (FLEXERIL) 5 MG tablet Take one tab by mouth at bedtime for muscle spasm 02/20/16   Kandra Nicolas, MD  dextromethorphan-guaiFENesin [facility] DM) 30-600 MG 12hr tablet Take 1 tablet by mouth 2 (two) times daily.    Historical Provider, MD  esomeprazole (NEXIUM) 40 MG capsule Take 1 capsule (40 mg total) by mouth daily at 12 noon. 11/28/15   [name], MD  fluorometholone (FML) 0.1 % ophthalmic suspension SHAKE LQ AND INT 1 GTT IN OU QID 01/19/16   Historical Provider, MD  formoterol (PERFOROMIST) 20 MCG/2ML nebulizer solution Take 2 mLs (20 mcg total) by nebulization 2 (two) times daily. DX code J44.9 11/28/15   [name], MD  furosemide (LASIX) 40 MG tablet TAKE 1 TABLET BY MOUTH DAILY 01/07/16   [name], MD  ipratropium (ATROVENT) 0.02 % nebulizer solution Take 2.5 mLs (0.5 mg total) by nebulization 3 (three) times daily. DX code J44.9 11/28/15   [name], MD  losartan (COZAAR) 100 MG tablet Take 1 tablet (100 mg total) by mouth daily. 11/30/11   Lelon Perla, MD  metoprolol tartrate (LOPRESSOR) 25 MG tablet TAKE 1 TABLET BY MOUTH TWICE DAILY 01/05/16   [name], MD  montelukast (SINGULAIR) 10 MG tablet Take 1 tablet (10 mg total) by mouth at bedtime. 01/26/16   [name], MD  nitroGLYCERIN (NITROSTAT) 0.4 MG SL tablet Place 1 tablet (0.4 mg total) under the tongue every 5 (five) minutes as needed for chest pain. 12/18/13   Lelon Perla, MD  OXYGEN Inhale into the lungs. 2 liters at nights    Historical Provider, MD  potassium chloride SA (K-DUR,KLOR-CON) 20 MEQ tablet Take 20 mEq by mouth daily.     Historical Provider, MD  predniSONE (DELTASONE) 20 MG tablet Take one tab by mouth  twice daily for 5 days, then one daily for 3 days. Take with food. 02/20/16   Kandra Nicolas, MD  ranitidine (ZANTAC) 150 MG tablet Take 1 tablet (150 mg total) by mouth at bedtime. 01/26/16   [name], MD  Spacer/Aero-Holding Chambers (AEROCHAMBER MV) inhaler Use as instructed 08/29/15   [name], MD    Family History Family History  Problem Relation Age of Onset  . Heart disease Mother 61  . Asthma Mother   . Diabetes Mother   . Cancer Mother     Salivary  . Emphysema Mother   . Heart disease      POSITIVE FAMILY HX. OF   .  Pancreatitis      QUESTIONABLE FROM ACE INHIBITORS  . COPD Sister     Social History Social History  Substance Use Topics  . Smoking status: Former Smoker    Packs/day: 1.50    Years: 42.00    Types: Cigarettes    Quit date: 04/26/2006  . Smokeless tobacco: Never Used  . Alcohol use No     Allergies   Augmentin [amoxicillin-pot clavulanate]; Sulfa antibiotics; Amlodipine; Pantoprazole; and Sulfonamide derivatives   Review of Systems Review of Systems  Constitutional: Negative for fever.  Cardiovascular: Negative for chest pain.  Gastrointestinal: Negative for abdominal pain.  Musculoskeletal: Positive for back pain.  Neurological: Negative for paresthesias.  All other systems reviewed and are negative.    Physical Exam Triage Vital Signs ED Triage Vitals  Enc Vitals Group     BP 02/20/16 1022 142/95     Pulse Rate 02/20/16 1022 83     Resp 02/20/16 1022 18     Temp 02/20/16 1022 97.7 F (36.5 C)     Temp Source 02/20/16 1022 Oral     SpO2 02/20/16 1022 92 %     Weight --      Height --      Head Circumference --      Peak Flow --      Pain Score 02/20/16 1025 10     Pain Loc --      Pain Edu? --      Excl. in [place]? --    No data found.   Updated Vital Signs BP 142/95 (BP Location: Left Arm)   Pulse 83   Temp 97.7 F (36.5 C) (Oral)   Resp 18   SpO2 92%   Visual Acuity Right Eye Distance:   Left Eye  Distance:   Bilateral Distance:    Right Eye Near:   Left Eye Near:    Bilateral Near:     Physical Exam  Constitutional: She appears well-developed and well-nourished. No distress.  HENT:  Head: Normocephalic.  Right Ear: External ear normal.  Left Ear: External ear normal.  Nose: Nose normal.  Mouth/Throat: Oropharynx is clear and moist.  Eyes: Conjunctivae are normal. Pupils are equal, round, and reactive to light.  Neck: Neck supple.  Cardiovascular: Normal heart sounds.   Pulmonary/Chest: Breath sounds normal.  Abdominal: Soft. There is no tenderness.  Musculoskeletal:       Back:  Patient's area of mid-back pain outlined on diagram, but she has no tenderness to palpation at this location.  She does have mild tenderness to palpation over left rhomboid muscles as noted on diagram.   Neurological: She is alert.  Skin: Skin is warm and dry.  Nursing note and vitals reviewed.    UC Treatments / Results  Labs (all labs ordered are listed, but only abnormal results are displayed) Labs Reviewed - No data to display  EKG  EKG Interpretation None       Radiology Dg Thoracic Spine 2 View  Result Date: 02/20/2016 CLINICAL DATA:  Two week history of back pain. EXAM: THORACIC SPINE 2 VIEWS COMPARISON:  11/03/2015 chest CT FINDINGS: No evidence of acute fracture. No traumatic malalignment. Generalized spondylosis with upper thoracic ankylosis. Osteopenia. No evidence of focal or aggressive bone lesion. Postoperative changes to the left upper lobe including radiotherapy seeds. IMPRESSION: No acute finding. Electronically Signed   By: Monte Fantasia M.D.   On: 02/20/2016 11:41    Procedures Procedures (including critical care time)  Medications Ordered  in UC Medications  methylPREDNISolone sodium succinate (SOLU-MEDROL) 125 mg/2 mL injection 80 mg (not administered)  acetaminophen (TYLENOL) tablet 975 mg (975 mg Oral Given 02/20/16 1055)     Initial Impression /  Assessment and Plan / UC Course  I have reviewed the triage vital signs and the nursing notes.  Pertinent labs & imaging results that were available during my care of the patient were reviewed by me and considered in my medical decision making (see chart for details).  Clinical Course  Administered Solumedrol '80mg'$  IM, then begin prednisone burst/taper. Begin Flexeril '5mg'$  HS. Begin prednisone Saturday 02/21/16. Apply ice pack for 20 to 30 minutes, 3 to 4 times daily  Continue until pain decreases.  Avoid using back exercise machine.  Resume other exercises as tolerated. Followup with Family Doctor if not improved in about 7 to 10 days.     Final Clinical Impressions(s) / UC Diagnoses   Final diagnoses:  Acute bilateral thoracic back pain    New Prescriptions New Prescriptions   CYCLOBENZAPRINE (FLEXERIL) 5 MG TABLET    Take one tab by mouth at bedtime for muscle spasm   PREDNISONE (DELTASONE) 20 MG TABLET    Take one tab by mouth twice daily for 5 days, then one daily for 3 days. Take with food.     Kandra Nicolas, MD 02/29/16 [phone]

## 2016-02-20 NOTE — ED Triage Notes (Signed)
Patient reports increasingly painful middle back pain which she associates with exercise class over a week ago. No OTC today.

## 2016-02-23 ENCOUNTER — Other Ambulatory Visit: Payer: Self-pay | Admitting: Internal Medicine

## 2016-02-23 ENCOUNTER — Ambulatory Visit: Payer: Medicare Other | Admitting: Internal Medicine

## 2016-03-04 ENCOUNTER — Other Ambulatory Visit: Payer: Self-pay | Admitting: Physician Assistant

## 2016-03-04 DIAGNOSIS — I2510 Atherosclerotic heart disease of native coronary artery without angina pectoris: Secondary | ICD-10-CM

## 2016-03-09 ENCOUNTER — Ambulatory Visit: Payer: Medicare Other | Admitting: Pulmonary Disease

## 2016-03-10 ENCOUNTER — Ambulatory Visit (INDEPENDENT_AMBULATORY_CARE_PROVIDER_SITE_OTHER): Payer: Medicare Other | Admitting: Pulmonary Disease

## 2016-03-10 ENCOUNTER — Encounter: Payer: Self-pay | Admitting: Pulmonary Disease

## 2016-03-10 VITALS — BP 126/86 | HR 98 | Ht 61.0 in | Wt 216.0 lb

## 2016-03-10 DIAGNOSIS — R0602 Shortness of breath: Secondary | ICD-10-CM

## 2016-03-10 DIAGNOSIS — J309 Allergic rhinitis, unspecified: Secondary | ICD-10-CM

## 2016-03-10 DIAGNOSIS — J449 Chronic obstructive pulmonary disease, unspecified: Secondary | ICD-10-CM

## 2016-03-10 DIAGNOSIS — R0789 Other chest pain: Secondary | ICD-10-CM

## 2016-03-10 DIAGNOSIS — K219 Gastro-esophageal reflux disease without esophagitis: Secondary | ICD-10-CM

## 2016-03-10 DIAGNOSIS — J9611 Chronic respiratory failure with hypoxia: Secondary | ICD-10-CM

## 2016-03-10 MED ORDER — ACAPELLA MISC: 0 refills | Status: DC

## 2016-03-10 MED ORDER — FLUTICASONE PROPIONATE 50 MCG/ACT NA SUSP: 2.0000 | Freq: Every day | NASAL | 5 refills | Status: DC

## 2016-03-10 NOTE — Patient Instructions (Signed)
   We are stopping the Perforomist.  You will continue using the Atrovent/Ipratroprium Bromide in your nebulizer 3 times daily.  You will continue using your Symbicort inhaler with the spacer twice daily.  I want you to take Guaifenesin (Mucinex) '600mg'$  twice daily.  We are getting you a Flutter Valve to use twice daily to help cough up the mucus. Make sure you blow into it 3 times in a row in the morning and at night to help loosen the mucus.  Continue taking the Zantac/Ranitidine as prescribed.  We are starting you on Flonase to help with your sinus congestion & allergies.  Call me if you have any new breathing problems or questions before your next appointment.

## 2016-03-10 NOTE — Progress Notes (Signed)
Test reviewed.  

## 2016-03-10 NOTE — Progress Notes (Addendum)
Subjective:    Patient ID: Brandy Cameron, female    DOB: [birth year], 80 y.o.   MRN: [id number]  C.C.:  Follow-up for Moderate-Severe COPD, Chronic Hypoxic Respiratory Failure, GERD w/ Dysphagia, & Chronic Allergic Rhinitis, & Bilateral Lung Nodules.  HPI  Moderate-Severe COPD:  Prescribed Perforomist & Budesonide nebulized twice daily along with Atrovent nebulized tid With plans to switch back to Symbicort twice daily once her prescriptions ran out after last appointment. She reports she was breathing better after an IM injection of steroids and starting oral Prednisone on 10/27 but then had worsening breathing after a couple of weeks. She was confused and quit using her Budesonide but has continued using her Perforomist. She is using her rescue albuterol in her nebulizer 1-2 times daily. Her cough primarily produces a white mucus but occasionally does have yellow streaks. She does wake up at night with coughing & wheezing at times.   Chronic Hypoxic Respiratory Failure: Prescribed oxygen at 2 L/m with exertion and 2 L/m while sleeping.  Attempted oxygen titration walk test today but patient has significant dyspnea and chest discomfort after 4 minutes prompting discontinuation of the test.  GERD w/ Dysphagia: Prescribed Zantac & Nexium. She reports she has continued taking Zantac but had nausea that she attributed to the Nexium so she quit taking it. No reflux, dyspepsia, or morning brash water taste.   Chronic Allergic Rhinitis: Prescribed Singulair but had an adverse effect and stopped the medication. She reports she has increased sinus congestion & drainage. She has been taking Mucinex over-the-counter.   Bilateral Lung Nodules: Hypermetabolic on PET/CT imaging. Seen by Dr. Julien Nordmann and Dr. Tammi Klippel. Previously declined treatment.  Review of Systems She denies any chest pain, tightness, or palpitations at this time. No fever or chills. Does have intermittent sweats. She was having diarrhea but  it resolved after she stopped Amoxicillin. No abdominal pain.   Allergies  Allergen Reactions  . Augmentin [Amoxicillin-Pot Clavulanate] Diarrhea  . Sulfa Antibiotics Rash and Shortness Of Breath  . Amlodipine   . Pantoprazole Nausea And Vomiting  . Sulfonamide Derivatives Rash    Current Outpatient Prescriptions on File Prior to Visit  Medication Sig Dispense Refill  . aspirin 81 MG tablet Take 81 mg by mouth daily.      Marland Kitchen atorvastatin (LIPITOR) 20 MG tablet Take 20 mg by mouth daily.  3  . atorvastatin (LIPITOR) 20 MG tablet TAKE 1 TABLET(20 MG) BY MOUTH DAILY 90 tablet 0  . budesonide-formoterol (SYMBICORT) 160-4.5 MCG/ACT inhaler Inhale 2 puffs into the lungs 2 (two) times daily.    . calcium carbonate 1250 MG capsule Take 1,250 mg by mouth daily.     . Coenzyme Q10 (CO Q-10) 100 MG CAPS Take 1 tablet by mouth daily.    . cromolyn (OPTICROM) 4 % ophthalmic solution INT 1 GTT INTO OU QID  12  . dextromethorphan-guaiFENesin (MUCINEX DM) 30-600 MG 12hr tablet Take 1 tablet by mouth 2 (two) times daily.    . formoterol (PERFOROMIST) 20 MCG/2ML nebulizer solution Take 2 mLs (20 mcg total) by nebulization 2 (two) times daily. DX code J44.9 120 mL 6  . furosemide (LASIX) 40 MG tablet TAKE 1 TABLET BY MOUTH DAILY 30 tablet 5  . ipratropium (ATROVENT) 0.02 % nebulizer solution Take 2.5 mLs (0.5 mg total) by nebulization 3 (three) times daily. DX code J44.9 225 mL 6  . losartan (COZAAR) 100 MG tablet Take 1 tablet (100 mg total) by mouth daily. 30 tablet 12  .  metoprolol tartrate (LOPRESSOR) 25 MG tablet TAKE 1 TABLET BY MOUTH TWICE DAILY 60 tablet 11  . nitroGLYCERIN (NITROSTAT) 0.4 MG SL tablet Place 1 tablet (0.4 mg total) under the tongue every 5 (five) minutes as needed for chest pain. 25 tablet 12  . OXYGEN Inhale into the lungs. 2 liters at nights    . potassium chloride SA (K-DUR,KLOR-CON) 20 MEQ tablet TAKE 1 TABLET BY MOUTH DAILY 90 tablet 1  . ranitidine (ZANTAC) 150 MG tablet Take  1 tablet (150 mg total) by mouth at bedtime. 30 tablet 2  . Spacer/Aero-Holding Chambers (AEROCHAMBER MV) inhaler Use as instructed 1 each 0   No current facility-administered medications on file prior to visit.     Past Medical History:  Diagnosis Date  . Asthma   . CAD (coronary artery disease)    a. LHC 9/11 (in setting of NSTEMI): LAD irregs, mLCx 50, pRCA 50, inf-apical AK;  b. Myoview 9/13: Normal stress nuclear study.  LV Ejection Fraction: 71%   . Carotid stenosis    a. Carotid US 9/13: bilat 0-39%;  b. Carotid US 7/15: no sig stenosis  . Chronic diastolic CHF (congestive heart failure) ([place])    a. Echo 7/15: Mild LVH, mild focal basal septal hypertrophy, EF 50-55%, no RWMA, Gr 1 DD  . COPD (chronic obstructive pulmonary disease) ([place])   . HTN (hypertension)   . Lung cancer [facility])    [name]  Marland Kitchen NSTEMI (non-ST elevated myocardial infarction) ([place])    with only mild to moderate CAD in September of 2011,There was concern for apival ballooning. >> ? Tako-Tsubo syndrome vs vasospasm    Past Surgical History:  Procedure Laterality Date  . CATARACT SURGERY    . FOOT SURGERY    . GALLBLADDER SURGERY    . KNEE SURGERY    . LUNG LOBECTOMY    . VESICOVAGINAL FISTULA CLOSURE W/ TAH      Family History  Problem Relation Age of Onset  . Heart disease Mother 32  . Asthma Mother   . Diabetes Mother   . Cancer Mother     Salivary  . Emphysema Mother   . Heart disease      POSITIVE FAMILY HX. OF   . Pancreatitis      QUESTIONABLE FROM ACE INHIBITORS  . COPD Sister     Social History   Social History  . Marital status: Divorced    Spouse name: N/A  . Number of children: N/A  . Years of education: N/A   Social History Main Topics  . Smoking status: Former Smoker    Packs/day: 1.50    Years: 42.00    Types: Cigarettes    Quit date: 04/26/2006  . Smokeless tobacco: Never Used  . Alcohol  use No  . Drug use: No  . Sexual activity: No   Other Topics Concern  . None   Social History Narrative   Originally from [place]. Always lived in [place]. Prior travel to [facility]. 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 Has a dog at home. No birds, mold, or hot tub exposure. No indoor plants. No carpet in her home. Does have draperies.       Objective:   Physical Exam BP 126/86 (BP Location: Left Arm, Cuff Size: Normal)   Pulse 98   Ht '5\' 1"'$  (1.549 m)   Wt 216 lb (98  kg)   SpO2 96%   BMI 40.81 kg/m  General:  Awake. Sister with patient today. Obese.  Integument:  Warm & dry. No rash on exposed skin.  Lymphatics:  No appreciated cervical or supraclavicular lymphadenoapthy. HEENT:  No scleral icterus. Continued mild bilateral nasal torted swelling. No scleral injection. Cardiovascular:  Regular rate. Trace edema persists. Normal S1 & S2.  Pulmonary:   Faint squeak bilaterally. Good aeration otherwise and without wheezing. Normal work of breathing on nasal cannula oxygen pulsed dose. Abdomen:  Soft. Protuberant. Nontender.  PFT 01/05/16: FVC 1.03 L (43%) FEV1 0.76 L (43%) FEV1/FVC 0.74 FEF 25-75 0.56 L (41%) positive bronchodilator response TLC 4.05 L (86%) RV 117% ERV 15% DLCO corrected 31% (Hgb 13.7) 11/28/15: FVC 1.09 L (45%) FEV1 0.81 L (45%) FEV1/FVC 0.74 FEF 25-75 0.66 L (49%) positive bronchodilator response TLC 3.90 L (83%) RV 121% ERV 100% DLCO corrected 64% (Hgb 14.6)  6MWT 03/10/16:  Walked 4 minutes / Baseline Sat 98% on RA / Nadir Sat 93% on RA @ 4:00 ( 10/14/15:  Baseline Sat 90% on RA at rest / Nadir Sat 87% on RA (required 2 L/m with exertion to maintain & test stopped early due to dyspnea and chest discomfort)  IMAGING PET CT 11/11/15 (previously reviewed by me): Medial left upper lobe patchy hypermetabolic opacity with maximal dimension 4.7 cm & max SUV 11.8. Right upper lobe spiculated nodule with maximal dimension 1.6 cm with max SUV 2.8. 6 mm  right lower lobe nodule noted. No other areas of hypermetabolism or adenopathy appreciated.  BARIUM SWALLOW 09/08/15 (per radiologist): Patent esophagus. No stricture or mass. Small hiatal hernia. Mildly abnormal motility. No reflux.  MAXILLOFACIAL CT W/O 09/02/15 (per radiologist): Mild mucosal thickening inferior lateral/posterior aspect right maxillary sinus. No air-fluid levels.  CT  CHEST W/O 08/04/15 (previously reviewed by me): Volume loss with postsurgical change in left lung. Low-attenuation soft tissue density along posterior & superior aspect surgical margin. 9 mm irregular nodule right lung apex. 4 mm triangular groundglass nodule right minor fissure. 6 mm nodule right lower lobe. Or millimeter nodule right middle lobe. Additional tiny bilateral pulmonary nodules. No pleural effusion or thickening. No pathologic hilar, mediastinal, or axillary lymphadenopathy. Precarinal lymph node with a fatty hilum. Nodule in right apex is definitely new when compared with prior imaging.   CXR PA/LAT 01/01/15 (previously reviewed by me): Blunting of right posterior cardiac angle. No pleural effusion appreciated. Fullness in the left hilum that appears chronic and is likely residual scar tissue from prior surgery. No new nodule or opacity appreciated. Heart normal in size. Mediastinum otherwise normal in contour.   CT CHEST W/ 12/15/11 (previously reviewed by me): 6 mm nodule in right lower lobe that is noncalcified and rounded per radiologist present since 01/08/09 & unchanged. No pathologic mediastinal or hilar adenopathy. Soft tissue within the anterior mediastinum corresponding to fullness in the left hilum. Subcentimeter nodular density in left upper lobe reportedly unchanged. Hepatic steatosis noted.   CARDIAC EKG 03/10/16 (personally reviewed by me): Normal sinus rhythm. No Q waves, ST segment changes, or T-wave changes that would suggest ischemia.  EKG 10/14/15 (previously reviewed by me): Normal sinus  rhythm. QTC 403 ms. Isoelectric T-wave lead V2. Artifact lead V5. Otherwise no signs of ischemia or conduction abnormality.  TTE (07/04/15): LV normal in size with EF 55%. No regional wall motion abnormalities. Grade 1 diastolic dysfunction. LA & RA normal in size. RV normal in size and function. No aortic stenosis or regurgitation. No  mitral stenosis. No pulmonic regurgitation. No significant tricuspid regurgitation. No pericardial effusion.  NUCLEAR STRESS TEST (07/03/15): Normal study with low risk. No T-wave inversion or ST segment deviation. EF 66% with hyperdynamic LV.  PATHOLOGY L Parotid Gland FNA (11/18/14): Benign fibrofatty soft tissue. No features of salivary gland present. LUL Wedge Resection (02/10/09): Invasive adenocarcinoma 2.0 cm LUL Additional Parenchymal Margin (02/10/09): No tumor identified 4L Lymph Node (02/10/09): No tumor identified 10L Lymph Node (02/10/09): No tumor identified  MICROBIOLOGY SPUTUM CTX (09/03/15):  AFB ngtd / Fungal ngtd  LABS 08/29/15 IgE:  15 RAST Panel: [place] 0.14 / [place] 0.12 / [place] 0.15 / [place] 0.12  06/20/15 BMP: 143/4.7/99/33/14/0.71/88/9.4 BNP: 62  01/16/15 CBC: 16.7/15.0/45.6/161 ProBNP: 33  02/06/09 ABG on RA: 7.395 / 42.7 / 125 / 98.6%    Assessment & Plan:  80 y.o. female remotely diagnosed with COPD and probable recurrence of NSCLC. Patient seems to be having more symptoms from her allergic rhinitis which I believe is complicating things with regards to symptoms from her underlying COPD. Her reflux seems to be better controlled on Zantac and without nausea caused by Nexium. She may benefit from intranasal medication use to control her allergic rhinitis and hopefully her cough and other symptoms. She exhibited no oxygen requirement today during her walk test but this had to be stopped prematurely with chest discomfort. Her discomfort has resolved and there is no evidence of any ischemia on my review of her EKG. I  instructed the patient to contact my office if she had any new breathing problems or questions before next appointment.  1. Moderate-Severe COPD: Continuing Symbicort twice daily and Atrovent nebulizers 3 times daily. Patient educated on proper medication and frequency of use. Starting patient on flutter valve twice daily and Mucinex twice daily for airway clearance. 2. Chronic Hypoxic Respiratory Failure: Continuing oxygen at 2 L/m while sleeping & with exertion. Patient instructed not to use oxygen at rest. 3. GERD w/ Dysphagia: Continuing Zantac. No changes. 4. Chronic Allergic Rhinitis: Starting patient on Flonase 1 spray each nostril twice daily. 5. Chest Discomfort:  Resolved. Deferring any further workup to her PCP.  6. Bilateral Lung Nodules: Likely recurrent malignancy. Patient has declined treatment at this time. 7. Immunizations:  S/P Influenza Vaccine October 2017, Tdap October 2017, Prevnar September 2016 & Pneumovax September 2017. 8. Follow-up: Patient to return to clinic in 3 months or sooner if needed.  Sonia Baller Ashok Cordia, M.D. [facility] Pulmonary & Critical Care Pager:  [phone] After 3pm or if no response, call [phone] 11:41 AM 03/10/16

## 2016-03-13 DIAGNOSIS — Z85118 Personal history of other malignant neoplasm of bronchus and lung: Secondary | ICD-10-CM | POA: Diagnosis not present

## 2016-03-13 DIAGNOSIS — J449 Chronic obstructive pulmonary disease, unspecified: Secondary | ICD-10-CM | POA: Diagnosis not present

## 2016-03-13 DIAGNOSIS — R0602 Shortness of breath: Secondary | ICD-10-CM | POA: Diagnosis not present

## 2016-03-16 DIAGNOSIS — Z85118 Personal history of other malignant neoplasm of bronchus and lung: Secondary | ICD-10-CM | POA: Diagnosis not present

## 2016-03-16 DIAGNOSIS — J449 Chronic obstructive pulmonary disease, unspecified: Secondary | ICD-10-CM | POA: Diagnosis not present

## 2016-03-16 DIAGNOSIS — R0602 Shortness of breath: Secondary | ICD-10-CM | POA: Diagnosis not present

## 2016-03-24 ENCOUNTER — Telehealth: Payer: Self-pay | Admitting: Internal Medicine

## 2016-03-24 NOTE — Telephone Encounter (Signed)
lvm to inform pt of r/s MD appt to 12/22 per LOS. Informed ptt that radiology will call to sch CT and lab appt will be moved to prior the appt

## 2016-03-30 ENCOUNTER — Other Ambulatory Visit (HOSPITAL_BASED_OUTPATIENT_CLINIC_OR_DEPARTMENT_OTHER): Payer: Medicare Other

## 2016-03-30 ENCOUNTER — Ambulatory Visit (HOSPITAL_COMMUNITY)
Admission: RE | Admit: 2016-03-30 | Discharge: 2016-03-30 | Disposition: A | Discharge status: Home / Self Care | Payer: Medicare Other | Source: Ambulatory Visit | Attending: Internal Medicine | Admitting: Internal Medicine

## 2016-03-30 DIAGNOSIS — J449 Chronic obstructive pulmonary disease, unspecified: Secondary | ICD-10-CM

## 2016-03-30 DIAGNOSIS — C3412 Malignant neoplasm of upper lobe, left bronchus or lung: Secondary | ICD-10-CM

## 2016-03-30 DIAGNOSIS — R918 Other nonspecific abnormal finding of lung field: Secondary | ICD-10-CM | POA: Diagnosis not present

## 2016-03-30 LAB — CBC WITH DIFFERENTIAL/PLATELET
BASO%: 0.7 % (ref 0.0–2.0)
BASOS ABS: 0.1 10*3/uL (ref 0.0–0.1)
EOS ABS: 0.2 10*3/uL (ref 0.0–0.5)
EOS%: 2 % (ref 0.0–7.0)
HEMATOCRIT: 42.5 % (ref 34.8–46.6)
HEMOGLOBIN: 13.6 g/dL (ref 11.6–15.9)
LYMPH#: 2.3 10*3/uL (ref 0.9–3.3)
LYMPH%: 20.9 % (ref 14.0–49.7)
MCH: 27.9 pg (ref 25.1–34.0)
MCHC: 32.1 g/dL (ref 31.5–36.0)
MCV: 87 fL (ref 79.5–101.0)
MONO#: 1.1 10*3/uL — AB (ref 0.1–0.9)
MONO%: 10.2 % (ref 0.0–14.0)
NEUT#: 7.4 10*3/uL — ABNORMAL HIGH (ref 1.5–6.5)
NEUT%: 66.2 % (ref 38.4–76.8)
PLATELETS: 307 10*3/uL (ref 145–400)
RBC: 4.88 10*6/uL (ref 3.70–5.45)
RDW: 14.6 % — AB (ref 11.2–14.5)
WBC: 11.1 10*3/uL — ABNORMAL HIGH (ref 3.9–10.3)

## 2016-03-30 LAB — COMPREHENSIVE METABOLIC PANEL
ALT: 21 U/L (ref 0–55)
AST: 16 U/L (ref 5–34)
Albumin: 3.2 g/dL — ABNORMAL LOW (ref 3.5–5.0)
Alkaline Phosphatase: 162 U/L — ABNORMAL HIGH (ref 40–150)
Anion Gap: 10 mEq/L (ref 3–11)
BILIRUBIN TOTAL: 0.28 mg/dL (ref 0.20–1.20)
BUN: 19.6 mg/dL (ref 7.0–26.0)
CHLORIDE: 102 meq/L (ref 98–109)
CO2: 34 meq/L — AB (ref 22–29)
CREATININE: 0.9 mg/dL (ref 0.6–1.1)
Calcium: 9.4 mg/dL (ref 8.4–10.4)
EGFR: 65 mL/min/{1.73_m2} — ABNORMAL LOW (ref >90)
GLUCOSE: 107 mg/dL (ref 70–140)
Potassium: 4.1 mEq/L (ref 3.5–5.1)
Sodium: 146 mEq/L — ABNORMAL HIGH (ref 136–145)
TOTAL PROTEIN: 6.6 g/dL (ref 6.4–8.3)

## 2016-03-30 MED ORDER — SODIUM CHLORIDE 0.9 % IJ SOLN
INTRAMUSCULAR | Status: AC
  Filled 2016-03-30: qty 50

## 2016-03-30 MED ORDER — IOPAMIDOL (ISOVUE-300) INJECTION 61%
INTRAVENOUS | Status: AC
  Filled 2016-03-30: qty 75

## 2016-03-30 MED ORDER — IOPAMIDOL (ISOVUE-300) INJECTION 61%
75.0000 mL | Freq: Once | INTRAVENOUS | Status: AC | PRN
  Administered 2016-03-30: 75 mL via INTRAVENOUS

## 2016-03-31 DIAGNOSIS — L720 Epidermal cyst: Secondary | ICD-10-CM | POA: Diagnosis not present

## 2016-03-31 DIAGNOSIS — L739 Follicular disorder, unspecified: Secondary | ICD-10-CM | POA: Diagnosis not present

## 2016-03-31 DIAGNOSIS — L570 Actinic keratosis: Secondary | ICD-10-CM | POA: Diagnosis not present

## 2016-03-31 DIAGNOSIS — L821 Other seborrheic keratosis: Secondary | ICD-10-CM | POA: Diagnosis not present

## 2016-04-05 ENCOUNTER — Ambulatory Visit: Payer: Medicare Other | Admitting: Internal Medicine

## 2016-04-12 DIAGNOSIS — R0602 Shortness of breath: Secondary | ICD-10-CM | POA: Diagnosis not present

## 2016-04-12 DIAGNOSIS — J449 Chronic obstructive pulmonary disease, unspecified: Secondary | ICD-10-CM | POA: Diagnosis not present

## 2016-04-12 DIAGNOSIS — Z85118 Personal history of other malignant neoplasm of bronchus and lung: Secondary | ICD-10-CM | POA: Diagnosis not present

## 2016-04-15 DIAGNOSIS — Z85118 Personal history of other malignant neoplasm of bronchus and lung: Secondary | ICD-10-CM | POA: Diagnosis not present

## 2016-04-15 DIAGNOSIS — R0602 Shortness of breath: Secondary | ICD-10-CM | POA: Diagnosis not present

## 2016-04-15 DIAGNOSIS — J449 Chronic obstructive pulmonary disease, unspecified: Secondary | ICD-10-CM | POA: Diagnosis not present

## 2016-04-16 ENCOUNTER — Ambulatory Visit (HOSPITAL_BASED_OUTPATIENT_CLINIC_OR_DEPARTMENT_OTHER): Payer: Medicare Other | Admitting: Internal Medicine

## 2016-04-16 ENCOUNTER — Encounter: Payer: Self-pay | Admitting: Internal Medicine

## 2016-04-16 ENCOUNTER — Telehealth: Payer: Self-pay | Admitting: Internal Medicine

## 2016-04-16 VITALS — BP 128/56 | HR 89 | Temp 98.5°F | Resp 20 | Ht 61.0 in | Wt 218.6 lb

## 2016-04-16 DIAGNOSIS — J449 Chronic obstructive pulmonary disease, unspecified: Secondary | ICD-10-CM | POA: Diagnosis not present

## 2016-04-16 DIAGNOSIS — C3412 Malignant neoplasm of upper lobe, left bronchus or lung: Secondary | ICD-10-CM

## 2016-04-16 NOTE — Telephone Encounter (Signed)
Appointments scheduled per 12/22 LOS. Patient given AVS report and calendars with future scheduled appointments. °

## 2016-04-16 NOTE — Progress Notes (Signed)
[place] [phone]   [phone]  OFFICE PROGRESS NOTE  Brandy Calico, MD [address] [facility] [address] [place] [address]  DIAGNOSIS: recurrent non-small cell lung cancer, adenocarcinoma with positive PDL 1 expression (60%) with left upper lobe as well as right upper lobe pulmonary nodules. She was initially diagnosed with a stage IA (T1a, N0, M0) in in October 2010.   Molecular studies USAA one): Genomic Alterations Identified? KRAS Q61L RBM10 [id number]* Additional Findings? Microsatellite status MS-Stable Tumor Mutation Burden Cannot Be Determined Additional Disease-relevant Genes with No Reportable Alterations Identified? EGFR ALK BRAF MET RET ERBB2 ROS1  PRIOR THERAPY: 1) status post wedge resection with seed implants.    CURRENT THERAPY: Observation.  INTERVAL HISTORY: Brandy Cameron 80 y.o. female came to the clinic today for follow-up visit accompanied by family member. The patient is feeling fine today with no specific complaints except for the baseline shortness breath and she is currently on home oxygen. She left her oxygen tank and the car and her oxygen saturation on room air was 89%. She is followed by Dr. Ashok Cordia for her COPD and other pulmonary dysfunction. She denied having any significant weight loss or night sweats. She has no chest pain but continues to have the shortness of breath and mild cough with no hemoptysis. She denied having any fever or chills. She had repeat CT scan of the chest performed recently and she is here for evaluation and discussion of her scan results.  MEDICAL HISTORY: Past Medical History:  Diagnosis Date  . Asthma   . CAD (coronary artery disease)    a. LHC 9/11 (in setting of NSTEMI): LAD irregs, mLCx 50, pRCA 50, inf-apical AK;  b. Myoview 9/13: Normal stress nuclear study.  LV Ejection Fraction: 71%   . Carotid stenosis    a. Carotid US 9/13: bilat 0-39%;  b. Carotid US 7/15: no  sig stenosis  . Chronic diastolic CHF (congestive heart failure) ([place])    a. Echo 7/15: Mild LVH, mild focal basal septal hypertrophy, EF 50-55%, no RWMA, Gr 1 DD  . COPD (chronic obstructive pulmonary disease) ([place])   . HTN (hypertension)   . Lung cancer [facility])    [name]  Marland Kitchen NSTEMI (non-ST elevated myocardial infarction) ([place])    with only mild to moderate CAD in September of 2011,There was concern for apival ballooning. >> ? Tako-Tsubo syndrome vs vasospasm    ALLERGIES:  is allergic to augmentin [amoxicillin-pot clavulanate]; sulfa antibiotics; amlodipine; pantoprazole; and sulfonamide derivatives.  MEDICATIONS:  Current Outpatient Prescriptions  Medication Sig Dispense Refill  . aspirin 81 MG tablet Take 81 mg by mouth daily.      Marland Kitchen atorvastatin (LIPITOR) 20 MG tablet TAKE 1 TABLET(20 MG) BY MOUTH DAILY 90 tablet 0  . budesonide-formoterol (SYMBICORT) 160-4.5 MCG/ACT inhaler Inhale 2 puffs into the lungs 2 (two) times daily.    . calcium carbonate 1250 MG capsule Take 1,250 mg by mouth daily.     . Coenzyme Q10 (CO Q-10) 100 MG CAPS Take 1 tablet by mouth daily.    . cromolyn (OPTICROM) 4 % ophthalmic solution INT 1 GTT INTO OU QID  12  . dextromethorphan-guaiFENesin (MUCINEX DM) 30-600 MG 12hr tablet Take 1 tablet by mouth 2 (two) times daily.    . fluticasone (FLONASE) 50 MCG/ACT nasal spray Place 2 sprays into both nostrils daily. 16 g  5  . furosemide (LASIX) 40 MG tablet TAKE 1 TABLET BY MOUTH DAILY 30 tablet 5  . ipratropium (ATROVENT) 0.02 % nebulizer solution Take 2.5 mLs (0.5 mg total) by nebulization 3 (three) times daily. DX code J44.9 225 mL 6  . losartan (COZAAR) 100 MG tablet Take 1 tablet (100 mg total) by mouth daily. 30 tablet 12  . metoprolol tartrate (LOPRESSOR) 25 MG tablet TAKE 1 TABLET BY MOUTH TWICE DAILY 60 tablet 11  . OXYGEN Inhale into the lungs. 2 liters at nights    .  potassium chloride SA (K-DUR,KLOR-CON) 20 MEQ tablet TAKE 1 TABLET BY MOUTH DAILY 90 tablet 1  . ranitidine (ZANTAC) 150 MG tablet Take 1 tablet (150 mg total) by mouth at bedtime. 30 tablet 2  . Spacer/Aero-Holding Chambers (AEROCHAMBER MV) inhaler Use as instructed 1 each 0  . nitroGLYCERIN (NITROSTAT) 0.4 MG SL tablet Place 1 tablet (0.4 mg total) under the tongue every 5 (five) minutes as needed for chest pain. (Patient not taking: Reported on 04/16/2016) 25 tablet 12   No current facility-administered medications for this visit.     SURGICAL HISTORY:  Past Surgical History:  Procedure Laterality Date  . CATARACT SURGERY    . FOOT SURGERY    . GALLBLADDER SURGERY    . KNEE SURGERY    . LUNG LOBECTOMY    . VESICOVAGINAL FISTULA CLOSURE W/ TAH      REVIEW OF SYSTEMS:  A comprehensive review of systems was negative except for: Constitutional: positive for fatigue Respiratory: positive for cough and dyspnea on exertion   PHYSICAL EXAMINATION: General appearance: alert, cooperative, fatigued and no distress Head: Normocephalic, without obvious abnormality, atraumatic Neck: no adenopathy, no JVD, supple, symmetrical, trachea midline and thyroid not enlarged, symmetric, no tenderness/mass/nodules Lymph nodes: Cervical, supraclavicular, and axillary nodes normal. Resp: diminished breath sounds bilaterally Back: symmetric, no curvature. ROM normal. No CVA tenderness. Cardio: regular rate and rhythm, S1, S2 normal, no murmur, click, rub or gallop GI: soft, non-tender; bowel sounds normal; no masses,  no organomegaly Extremities: extremities normal, atraumatic, no cyanosis or edema  ECOG PERFORMANCE STATUS: 1 - Symptomatic but completely ambulatory  Blood pressure (!) 128/56, pulse 89, temperature 98.5 F (36.9 C), temperature source Oral, resp. rate 20, height '5\' 1"'$  (1.549 m), weight 218 lb 9.6 oz (99.2 kg), SpO2 (!) 89 %.  LABORATORY DATA: Lab Results  Component Value Date   WBC  11.1 (H) 03/30/2016   HGB 13.6 03/30/2016   HCT 42.5 03/30/2016   MCV 87.0 03/30/2016   PLT 307 03/30/2016      Chemistry      Component Value Date/Time   NA 146 (H) 03/30/2016 1259   K 4.1 03/30/2016 1259   CL 105 02/06/2016 1251   CL 101 12/15/2011 0920   CO2 34 (H) 03/30/2016 1259   BUN 19.6 03/30/2016 1259   CREATININE 0.9 03/30/2016 1259      Component Value Date/Time   CALCIUM 9.4 03/30/2016 1259   ALKPHOS 162 (H) 03/30/2016 1259   AST 16 03/30/2016 1259   ALT 21 03/30/2016 1259   BILITOT 0.28 03/30/2016 1259       RADIOGRAPHIC STUDIES: Ct Chest W Contrast  Result Date: 03/31/2016 CLINICAL DATA:  Lung cancer diagnosed in September 2010. Status post left upper lobectomy and seed implants. EXAM: CT CHEST WITH CONTRAST TECHNIQUE: Multidetector CT imaging of the chest was performed during intravenous contrast administration. CONTRAST:  58m ISOVUE-300 IOPAMIDOL (ISOVUE-300) INJECTION 61% COMPARISON:  PET-CT 11/11/2015. Chest  CT 11/03/2015, 08/04/2015 and 12/15/2011. FINDINGS: Cardiovascular: Atherosclerosis of the aorta, great vessels and coronary arteries. No acute vascular findings are present. The heart size is normal. There is no pericardial effusion. Mediastinum/Nodes: There are no enlarged mediastinal, hilar or axillary lymph nodes. Stable mild nodularity within the right thyroid lobe, not hypermetabolic on PET-CT. Small hiatal hernia. Lungs/Pleura: There is no pleural effusion. There are stable postsurgical changes status post wedge resection in the left upper lobe. Density surrounding the brachytherapy seeds anteriorly in the upper left hemithorax is unchanged. Likewise, there is persistent left suprahilar nodularity, significantly hypermetabolic on PET-CT and remaining worrisome for recurrent tumor. This is difficult to accurately measure given its irregular shape, although measures approximately 4.9 x 1.7 cm on image 35 (4.7 x 1.5 cm on previous PET-CT). Irregular right  upper lobe nodule is unchanged, measuring 1.6 x 1.0 cm on image 15 (1.6 x 0.9 cm on PET-CT). There is a stable 7 mm right lower lobe nodule on image 84. No new or enlarging nodules are seen. Upper abdomen: No suspicious findings are seen within the visualized upper abdomen. There is hepatic low density consistent with steatosis. There is a small cyst in the upper pole of the left kidney. Musculoskeletal/Chest wall: There is no chest wall mass or suspicious osseous finding. IMPRESSION: 1. Overall, no significant changes are seen compared with the most recent PET-CT of 5 months ago. There is no record of any interval therapy. Persistent hypermetabolic nodularity in the left suprahilar region remains concerning for tumor recurrence. 2. The right apical nodule is unchanged, demonstrating low level hypermetabolic activity on prior PET-CT, worrisome for secondary primary tumor. 3. No disease progression seen. Electronically Signed   By: Richardean Sale M.D.   On: 03/31/2016 09:07    ASSESSMENT AND PLAN: This is a very pleasant 80 years old white female with recurrent non-small cell lung cancer, adenocarcinoma initially diagnosed in 2010 as stage IA status post left upper lobe wedge resection and seed implants.  The patient continued on observation since that time. Her recent CT scan of the chest showed no significant change compared to the previous PET scan. The right apical nodule still unchanged and suspicious for secondary primary tumor. I discussed the scan results with the patient and her family member. I recommended for her to continue on observation with repeat CT scan of the chest in 4 months for restaging of her disease. For the COPD, she would continue her routine follow-up visit and treatment under the care of Dr. Ashok Cordia. She was advised to call immediately if she has any concerning symptoms in the interval.  The patient voices understanding of current disease status and treatment options and is in  agreement with the current care plan.  All questions were answered. The patient knows to call the clinic with any problems, questions or concerns. We can certainly see the patient much sooner if necessary. I spent 10 minutes counseling the patient face to face. The total time spent in the appointment was 15 minutes.  Disclaimer: This note was dictated with voice recognition software. Similar sounding words can inadvertently be transcribed and may not be corrected upon review.

## 2016-04-21 ENCOUNTER — Emergency Department
Admission: EM | Admit: 2016-04-21 | Discharge: 2016-04-21 | Disposition: A | Discharge status: Home / Self Care | Payer: Medicare Other | Source: Home / Self Care | Attending: Family Medicine | Admitting: Family Medicine

## 2016-04-21 ENCOUNTER — Encounter: Payer: Self-pay | Admitting: *Deleted

## 2016-04-21 DIAGNOSIS — J209 Acute bronchitis, unspecified: Secondary | ICD-10-CM | POA: Diagnosis not present

## 2016-04-21 MED ORDER — DOXYCYCLINE HYCLATE 100 MG PO CAPS: 100.0000 mg | ORAL_CAPSULE | Freq: Two times a day (BID) | ORAL | 0 refills | Status: DC

## 2016-04-21 MED ORDER — PREDNISONE 20 MG PO TABS: ORAL_TABLET | ORAL | 0 refills | Status: DC

## 2016-04-21 MED ORDER — METHYLPREDNISOLONE SODIUM SUCC 125 MG IJ SOLR
80.0000 mg | Freq: Once | INTRAMUSCULAR | Status: AC
  Administered 2016-04-21: 80 mg via INTRAMUSCULAR

## 2016-04-21 NOTE — Discharge Instructions (Signed)
Begin prednisone Thursday 04/22/16. Continue plain guaifenesin ('1200mg'$  extended release tabs such as Mucinex) twice daily, with plenty of water, for cough and congestion.  Get adequate rest.   Continue inhalers and nebulizer as prescribed. Try warm salt water gargles for sore throat.  Stop all antihistamines for now, and other non-prescription cough/cold preparations. If symptoms become significantly worse during the night or over the weekend, proceed to the local emergency room.

## 2016-04-21 NOTE — ED Triage Notes (Signed)
Pt c/o productive cough and runny nose x 3 days. Denies fever. Reports some chills. She wears O2 at home, but did not bring it into the clinic today.

## 2016-04-21 NOTE — ED Provider Notes (Signed)
[facility]    CSN: [id number] Arrival date & time: 04/21/16  1553     History   Chief Complaint Chief Complaint  Patient presents with  . Cough    HPI Brandy Cameron is a 80 y.o. female.   Patient complains of three day history of typical cold-like symptoms developing over several days, including mild sore throat, sinus congestion, fatigue, and cough.  She has had chills without fever.  She wears O2 at home but did not bring today.  She denies pleuritic pain. She has a history of primary CA of the LUL.   The history is provided by the patient and a friend.    Past Medical History:  Diagnosis Date  . Asthma   . CAD (coronary artery disease)    a. LHC 9/11 (in setting of NSTEMI): LAD irregs, mLCx 50, pRCA 50, inf-apical AK;  b. Myoview 9/13: Normal stress nuclear study.  LV Ejection Fraction: 71%   . Carotid stenosis    a. Carotid US 9/13: bilat 0-39%;  b. Carotid US 7/15: no sig stenosis  . Chronic diastolic CHF (congestive heart failure) ([place])    a. Echo 7/15: Mild LVH, mild focal basal septal hypertrophy, EF 50-55%, no RWMA, Gr 1 DD  . COPD (chronic obstructive pulmonary disease) ([place])   . HTN (hypertension)   . Lung cancer [facility])    [name]  Marland Kitchen NSTEMI (non-ST elevated myocardial infarction) ([place])    with only mild to moderate CAD in September of 2011,There was concern for apival ballooning. >> ? Tako-Tsubo syndrome vs vasospasm    Patient Active Problem List   Diagnosis Date Noted  . RTI (respiratory tract infection) 02/03/2016  . Chronic respiratory failure ([place]) 10/14/2015  . Hyperglycemia 10/07/2015  . Menopause ovarian failure 10/07/2015  . Encounter for colorectal cancer screening 10/07/2015  . COPD (chronic obstructive pulmonary disease) ([place]) 08/29/2015  . GERD (gastroesophageal reflux disease) 08/29/2015  . Multiple lung nodules on CT 08/04/2015  . Parotid  mass   . Acute diastolic congestive heart failure ([place]) 10/29/2013  . Cerebrovascular disease 08/08/2013  . Bruit 12/01/2011  . ASTHMA 12/26/2010  . CAD (coronary artery disease) 11/11/2010  . Hypertension 11/11/2010  . Hyperlipidemia with target LDL less than 70 11/11/2010  . Primary cancer of left upper lobe of lung ([place]) 11/11/2010    Past Surgical History:  Procedure Laterality Date  . CATARACT SURGERY    . FOOT SURGERY    . GALLBLADDER SURGERY    . KNEE SURGERY    . LUNG LOBECTOMY    . VESICOVAGINAL FISTULA CLOSURE W/ TAH      OB History    No data available       Home Medications    Prior to Admission medications   Medication Sig Start Date End Date Taking? Authorizing Provider  aspirin 81 MG tablet Take 81 mg by mouth daily.      Historical Provider, MD  atorvastatin (LIPITOR) 20 MG tablet TAKE 1 TABLET(20 MG) BY MOUTH DAILY 03/05/16   Liliane Shi, PA-C  budesonide-formoterol [facility]) 160-4.5 MCG/ACT inhaler Inhale 2 puffs into the lungs 2 (two) times daily.    Historical Provider, MD  calcium carbonate 1250 MG capsule Take 1,250 mg by mouth daily.     Historical Provider, MD  Coenzyme Q10 (CO Q-10) 100 MG CAPS Take 1 tablet by mouth daily.    Historical  Provider, MD  cromolyn (OPTICROM) 4 % ophthalmic solution INT 1 GTT INTO OU QID 09/04/15   Historical Provider, MD  dextromethorphan-guaiFENesin (MUCINEX DM) 30-600 MG 12hr tablet Take 1 tablet by mouth 2 (two) times daily.    Historical Provider, MD  doxycycline (VIBRAMYCIN) 100 MG capsule Take 1 capsule (100 mg total) by mouth 2 (two) times daily. Take with food. 04/21/16   Kandra Nicolas, MD  fluticasone (FLONASE) 50 MCG/ACT nasal spray Place 2 sprays into both nostrils daily. 03/10/16   [name], MD  furosemide (LASIX) 40 MG tablet TAKE 1 TABLET BY MOUTH DAILY 01/07/16   [name], MD  ipratropium (ATROVENT) 0.02 % nebulizer solution Take 2.5 mLs (0.5 mg total) by nebulization 3 (three) times  daily. DX code J44.9 11/28/15   [name], MD  losartan (COZAAR) 100 MG tablet Take 1 tablet (100 mg total) by mouth daily. 11/30/11   Lelon Perla, MD  metoprolol tartrate (LOPRESSOR) 25 MG tablet TAKE 1 TABLET BY MOUTH TWICE DAILY 01/05/16   [name], MD  nitroGLYCERIN (NITROSTAT) 0.4 MG SL tablet Place 1 tablet (0.4 mg total) under the tongue every 5 (five) minutes as needed for chest pain. Patient not taking: Reported on 04/16/2016 12/18/13   Lelon Perla, MD  OXYGEN Inhale into the lungs. 2 liters at nights    Historical Provider, MD  potassium chloride SA (K-DUR,KLOR-CON) 20 MEQ tablet TAKE 1 TABLET BY MOUTH DAILY 02/23/16   [name], MD  predniSONE (DELTASONE) 20 MG tablet Take one tab by mouth twice daily for 5 days, then one daily for 3 days. Take with food. 04/21/16   Kandra Nicolas, MD  ranitidine (ZANTAC) 150 MG tablet Take 1 tablet (150 mg total) by mouth at bedtime. 01/26/16   [name], MD  Spacer/Aero-Holding Chambers (AEROCHAMBER MV) inhaler Use as instructed 08/29/15   [name], MD    Family History Family History  Problem Relation Age of Onset  . Heart disease Mother 68  . Asthma Mother   . Diabetes Mother   . Cancer Mother     Salivary  . Emphysema Mother   . Heart disease      POSITIVE FAMILY HX. OF   . Pancreatitis      QUESTIONABLE FROM ACE INHIBITORS  . COPD Sister     Social History Social History  Substance Use Topics  . Smoking status: Former Smoker    Packs/day: 1.50    Years: 42.00    Types: Cigarettes    Quit date: 04/26/2006  . Smokeless tobacco: Never Used  . Alcohol use No     Allergies   Augmentin [amoxicillin-pot clavulanate]; Sulfa antibiotics; Amlodipine; Pantoprazole; and Sulfonamide derivatives   Review of Systems Review of Systems  + sore throat + cough No pleuritic pain + wheezing + nasal congestion + post-nasal drainage No sinus pain/pressure No itchy/red eyes No earache No  hemoptysis + SOB No fever, + chills No nausea No vomiting No abdominal pain No diarrhea No urinary symptoms No skin rash + fatigue No myalgias + headache Used OTC meds without relief    Physical Exam Triage Vital Signs ED Triage Vitals  Enc Vitals Group     BP 04/21/16 1613 133/79     Pulse Rate 04/21/16 1613 81     Resp 04/21/16 1613 18     Temp 04/21/16 1613 97.6 F (36.4 C)     Temp Source 04/21/16 1613 Oral  SpO2 04/21/16 1613 90 %     Weight 04/21/16 1616 219 lb (99.3 kg)     Height 04/21/16 1616 '5\' 1"'$  (1.549 m)     Head Circumference --      Peak Flow --      Pain Score 04/21/16 1618 0     Pain Loc --      Pain Edu? --      Excl. in [place]? --    No data found.   Updated Vital Signs BP 133/79 (BP Location: Left Arm)   Pulse 81   Temp 97.6 F (36.4 C) (Oral)   Resp 18   Ht '5\' 1"'$  (1.549 m)   Wt 219 lb (99.3 kg)   SpO2 90%   BMI 41.38 kg/m   Visual Acuity Right Eye Distance:   Left Eye Distance:   Bilateral Distance:    Right Eye Near:   Left Eye Near:    Bilateral Near:     Physical Exam Nursing notes and Vital Signs reviewed. Appearance:  Patient appears stated age, and in no acute distress.  She is alert and oriented.  Eyes:  Pupils are equal, round, and reactive to light and accomodation.  Extraocular movement is intact.  Conjunctivae are not inflamed  Ears:  Canals normal.  Tympanic membranes normal.  Nose:  Mildly congested turbinates.  No sinus tenderness.   Pharynx:  Normal; moist mucous membranes  Neck:  Supple.  Tender enlarged posterior/lateral nodes are palpated bilaterally  Lungs:  Course breath sounds and scattered expiratory wheezes bilaterally.  Breath sounds are equal.  Moving air well.  No respiratory distress. Heart:  Regular rate and rhythm without murmurs, rubs, or gallops.  Abdomen:  Nontender without masses or hepatosplenomegaly.  Bowel sounds are present.  No CVA or flank tenderness.  Extremities:  No edema.  Skin:  No  rash present.    UC Treatments / Results  Labs (all labs ordered are listed, but only abnormal results are displayed) Labs Reviewed - No data to display  EKG  EKG Interpretation None       Radiology No results found.  Procedures Procedures (including critical care time)  Medications Ordered in UC Medications  methylPREDNISolone sodium succinate (SOLU-MEDROL) 125 mg/2 mL injection 80 mg (not administered)     Initial Impression / Assessment and Plan / UC Course  I have reviewed the triage vital signs and the nursing notes.  Pertinent labs & imaging results that were available during my care of the patient were reviewed by me and considered in my medical decision making (see chart for details).  Clinical Course   Administered Solumedrol '80mg'$  IM. Begin empiric doxycycline '100mg'$  BID. Begin prednisone burst/taper Thursday 04/22/16. Continue plain guaifenesin ('1200mg'$  extended release tabs such as Mucinex) twice daily, with plenty of water, for cough and congestion.  Get adequate rest.   Continue inhalers and nebulizer as prescribed. Try warm salt water gargles for sore throat.  Stop all antihistamines for now, and other non-prescription cough/cold preparations. If symptoms become significantly worse during the night or over the weekend, proceed to the local emergency room.  Followup with pulmonologist if not improved one week.    Final Clinical Impressions(s) / UC Diagnoses   Final diagnoses:  Acute bronchitis, unspecified organism    New Prescriptions New Prescriptions   DOXYCYCLINE (VIBRAMYCIN) 100 MG CAPSULE    Take 1 capsule (100 mg total) by mouth 2 (two) times daily. Take with food.   PREDNISONE (DELTASONE) 20 MG TABLET  Take one tab by mouth twice daily for 5 days, then one daily for 3 days. Take with food.     Kandra Nicolas, MD 05/04/16 1106

## 2016-05-07 ENCOUNTER — Ambulatory Visit: Payer: Medicare Other | Admitting: Cardiology

## 2016-05-11 ENCOUNTER — Other Ambulatory Visit: Payer: Self-pay | Admitting: *Deleted

## 2016-05-11 DIAGNOSIS — I10 Essential (primary) hypertension: Secondary | ICD-10-CM | POA: Diagnosis not present

## 2016-05-11 DIAGNOSIS — L600 Ingrowing nail: Secondary | ICD-10-CM | POA: Diagnosis not present

## 2016-05-11 DIAGNOSIS — L03031 Cellulitis of right toe: Secondary | ICD-10-CM | POA: Diagnosis not present

## 2016-05-11 MED ORDER — LOSARTAN POTASSIUM 100 MG PO TABS: 100.0000 mg | ORAL_TABLET | Freq: Every day | ORAL | 1 refills | Status: DC

## 2016-05-13 DIAGNOSIS — J449 Chronic obstructive pulmonary disease, unspecified: Secondary | ICD-10-CM | POA: Diagnosis not present

## 2016-05-13 DIAGNOSIS — R0602 Shortness of breath: Secondary | ICD-10-CM | POA: Diagnosis not present

## 2016-05-13 DIAGNOSIS — Z85118 Personal history of other malignant neoplasm of bronchus and lung: Secondary | ICD-10-CM | POA: Diagnosis not present

## 2016-05-16 DIAGNOSIS — R0602 Shortness of breath: Secondary | ICD-10-CM | POA: Diagnosis not present

## 2016-05-16 DIAGNOSIS — Z85118 Personal history of other malignant neoplasm of bronchus and lung: Secondary | ICD-10-CM | POA: Diagnosis not present

## 2016-05-16 DIAGNOSIS — J449 Chronic obstructive pulmonary disease, unspecified: Secondary | ICD-10-CM | POA: Diagnosis not present

## 2016-05-21 DIAGNOSIS — H35372 Puckering of macula, left eye: Secondary | ICD-10-CM | POA: Diagnosis not present

## 2016-05-21 DIAGNOSIS — H04123 Dry eye syndrome of bilateral lacrimal glands: Secondary | ICD-10-CM | POA: Diagnosis not present

## 2016-05-21 DIAGNOSIS — H10413 Chronic giant papillary conjunctivitis, bilateral: Secondary | ICD-10-CM | POA: Diagnosis not present

## 2016-05-21 DIAGNOSIS — Z961 Presence of intraocular lens: Secondary | ICD-10-CM | POA: Diagnosis not present

## 2016-05-25 DIAGNOSIS — M7752 Other enthesopathy of left foot: Secondary | ICD-10-CM | POA: Diagnosis not present

## 2016-05-25 DIAGNOSIS — I10 Essential (primary) hypertension: Secondary | ICD-10-CM | POA: Diagnosis not present

## 2016-05-25 DIAGNOSIS — M7751 Other enthesopathy of right foot: Secondary | ICD-10-CM | POA: Diagnosis not present

## 2016-05-25 DIAGNOSIS — L600 Ingrowing nail: Secondary | ICD-10-CM | POA: Diagnosis not present

## 2016-05-27 ENCOUNTER — Ambulatory Visit (INDEPENDENT_AMBULATORY_CARE_PROVIDER_SITE_OTHER): Payer: Medicare Other | Admitting: Nurse Practitioner

## 2016-05-27 ENCOUNTER — Encounter: Payer: Self-pay | Admitting: Nurse Practitioner

## 2016-05-27 VITALS — BP 120/70 | HR 84 | Temp 97.8°F | Ht 61.0 in | Wt 221.0 lb

## 2016-05-27 DIAGNOSIS — N760 Acute vaginitis: Secondary | ICD-10-CM | POA: Diagnosis not present

## 2016-05-27 DIAGNOSIS — R300 Dysuria: Secondary | ICD-10-CM | POA: Diagnosis not present

## 2016-05-27 LAB — POCT URINALYSIS DIPSTICK
BILIRUBIN UA: NEGATIVE
Glucose, UA: NEGATIVE
KETONES UA: NEGATIVE
Leukocytes, UA: NEGATIVE
NITRITE UA: NEGATIVE
PH UA: 6
Protein, UA: NEGATIVE
SPEC GRAV UA: 1.015
Urobilinogen, UA: 0.2

## 2016-05-27 MED ORDER — METRONIDAZOLE 0.75 % VA GEL: 1.0000 | Freq: Two times a day (BID) | VAGINAL | 0 refills | Status: DC

## 2016-05-27 MED ORDER — SACCHAROMYCES BOULARDII 250 MG PO CAPS: 250.0000 mg | ORAL_CAPSULE | Freq: Two times a day (BID) | ORAL | 0 refills | Status: DC

## 2016-05-27 MED ORDER — FLUCONAZOLE 200 MG PO TABS: 200.0000 mg | ORAL_TABLET | Freq: Once | ORAL | 0 refills | Status: AC

## 2016-05-27 NOTE — Progress Notes (Signed)
Pre visit review using our clinic review tool, if applicable. No additional management support is needed unless otherwise documented below in the visit note. 

## 2016-05-27 NOTE — Progress Notes (Signed)
Subjective:  Patient ID: Brandy Cameron, female    DOB: [birth year]  Age: 81 y.o. MRN: [id number]  CC: Follow-up (buring sensation, burning for 3-4 days. )   Vaginal Itching  The patient's primary symptoms include genital itching, a genital odor and vaginal discharge. The patient's pertinent negatives include no genital lesions, genital rash, missed menses, pelvic pain or vaginal bleeding. This is a new problem. The current episode started in the past 7 days. The problem occurs constantly. The problem has been gradually worsening. The patient is experiencing no pain. The problem affects both sides. Associated symptoms include dysuria. Pertinent negatives include no abdominal pain, anorexia, back pain, chills, constipation, diarrhea, discolored urine, fever, flank pain, frequency, headaches, hematuria, joint pain, joint swelling, nausea, painful intercourse, rash, sore throat, urgency or vomiting. The vaginal discharge was clear and white. There has been no bleeding. Exacerbated by: current use of doxycycline for bronchitis and sinusitis. She is not sexually active. She is postmenopausal. There is no history of vaginosis.    Outpatient Medications Prior to Visit  Medication Sig Dispense Refill  . aspirin 81 MG tablet Take 81 mg by mouth daily.      Marland Kitchen atorvastatin (LIPITOR) 20 MG tablet TAKE 1 TABLET(20 MG) BY MOUTH DAILY 90 tablet 0  . budesonide-formoterol (SYMBICORT) 160-4.5 MCG/ACT inhaler Inhale 2 puffs into the lungs 2 (two) times daily.    . calcium carbonate 1250 MG capsule Take 1,250 mg by mouth daily.     . Coenzyme Q10 (CO Q-10) 100 MG CAPS Take 1 tablet by mouth daily.    . cromolyn (OPTICROM) 4 % ophthalmic solution INT 1 GTT INTO OU QID  12  . dextromethorphan-guaiFENesin (MUCINEX DM) 30-600 MG 12hr tablet Take 1 tablet by mouth 2 (two) times daily.    Marland Kitchen doxycycline (VIBRAMYCIN) 100 MG capsule Take 1 capsule (100 mg total) by mouth 2 (two) times daily. Take with food. 14 capsule 0    . fluticasone (FLONASE) 50 MCG/ACT nasal spray Place 2 sprays into both nostrils daily. 16 g 5  . furosemide (LASIX) 40 MG tablet TAKE 1 TABLET BY MOUTH DAILY 30 tablet 5  . ipratropium (ATROVENT) 0.02 % nebulizer solution Take 2.5 mLs (0.5 mg total) by nebulization 3 (three) times daily. DX code J44.9 225 mL 6  . losartan (COZAAR) 100 MG tablet Take 1 tablet (100 mg total) by mouth daily. Yearly physical due in June must see MD for future refills 90 tablet 1  . metoprolol tartrate (LOPRESSOR) 25 MG tablet TAKE 1 TABLET BY MOUTH TWICE DAILY 60 tablet 11  . nitroGLYCERIN (NITROSTAT) 0.4 MG SL tablet Place 1 tablet (0.4 mg total) under the tongue every 5 (five) minutes as needed for chest pain. 25 tablet 12  . OXYGEN Inhale into the lungs. 2 liters at nights    . potassium chloride SA (K-DUR,KLOR-CON) 20 MEQ tablet TAKE 1 TABLET BY MOUTH DAILY 90 tablet 1  . predniSONE (DELTASONE) 20 MG tablet Take one tab by mouth twice daily for 5 days, then one daily for 3 days. Take with food. 13 tablet 0  . Spacer/Aero-Holding Chambers (AEROCHAMBER MV) inhaler Use as instructed 1 each 0  . ranitidine (ZANTAC) 150 MG tablet Take 1 tablet (150 mg total) by mouth at bedtime. (Patient not taking: Reported on 05/27/2016) 30 tablet 2   No facility-administered medications prior to visit.     ROS See HPI  Objective:  BP 120/70   Pulse 84   Temp 97.8 F (36.6  C)   Ht '5\' 1"'$  (1.549 m)   Wt 221 lb (100.2 kg)   SpO2 98%   BMI 41.76 kg/m   BP Readings from Last 3 Encounters:  05/27/16 120/70  04/21/16 133/79  04/16/16 (!) 128/56    Wt Readings from Last 3 Encounters:  05/27/16 221 lb (100.2 kg)  04/21/16 219 lb (99.3 kg)  04/16/16 218 lb 9.6 oz (99.2 kg)    Physical Exam  Constitutional: She is oriented to person, place, and time. No distress.  Genitourinary: Rectal exam shows external hemorrhoid. There is rash on the right labia. There is rash on the left labia. There is erythema and tenderness in  the vagina. No bleeding in the vagina. No foreign body in the vagina. No signs of injury around the vagina. Vaginal discharge found.  Neurological: She is alert and oriented to person, place, and time.  Skin: Skin is warm and dry.  Vitals reviewed.   Lab Results  Component Value Date   WBC 11.1 (H) 03/30/2016   HGB 13.6 03/30/2016   HCT 42.5 03/30/2016   PLT 307 03/30/2016   GLUCOSE 107 03/30/2016   CHOL 135 10/07/2015   TRIG 189.0 (H) 10/07/2015   HDL 43.80 10/07/2015   LDLCALC 53 10/07/2015   ALT 21 03/30/2016   AST 16 03/30/2016   NA 146 (H) 03/30/2016   K 4.1 03/30/2016   CL 105 02/06/2016   CREATININE 0.9 03/30/2016   BUN 19.6 03/30/2016   CO2 34 (H) 03/30/2016   TSH 1.90 10/07/2015   INR 1.00 11/22/2014   HGBA1C 5.8 10/07/2015    No results found.  Assessment & Plan:   Brandy Cameron was seen today for follow-up.  Diagnoses and all orders for this visit:  Dysuria -     POCT Urinalysis Dipstick -     metroNIDAZOLE (METROGEL) 0.75 % vaginal gel; Place 1 Applicatorful vaginally 2 (two) times daily. -     fluconazole (DIFLUCAN) 200 MG tablet; Take 1 tablet (200 mg total) by mouth once. -     saccharomyces boulardii (FLORASTOR) 250 MG capsule; Take 1 capsule (250 mg total) by mouth 2 (two) times daily.  Vaginosis -     metroNIDAZOLE (METROGEL) 0.75 % vaginal gel; Place 1 Applicatorful vaginally 2 (two) times daily. -     fluconazole (DIFLUCAN) 200 MG tablet; Take 1 tablet (200 mg total) by mouth once. -     saccharomyces boulardii (FLORASTOR) 250 MG capsule; Take 1 capsule (250 mg total) by mouth 2 (two) times daily.   I am having Brandy Cameron start on metroNIDAZOLE, fluconazole, and saccharomyces boulardii. I am also having her maintain her aspirin, budesonide-formoterol, nitroGLYCERIN, Co Q-10, AEROCHAMBER MV, OXYGEN, cromolyn, dextromethorphan-guaiFENesin, calcium carbonate, ipratropium, metoprolol tartrate, furosemide, ranitidine, potassium chloride SA, atorvastatin,  fluticasone, predniSONE, doxycycline, and losartan.  Meds ordered this encounter  Medications  . metroNIDAZOLE (METROGEL) 0.75 % vaginal gel    Sig: Place 1 Applicatorful vaginally 2 (two) times daily.    Dispense:  70 g    Refill:  0    Order Specific Question:   Supervising Provider    Answer:   [place] Anger [[id number]]  . fluconazole (DIFLUCAN) 200 MG tablet    Sig: Take 1 tablet (200 mg total) by mouth once.    Dispense:  1 tablet    Refill:  0    Order Specific Question:   Supervising Provider    Answer:   [place] Anger [[id number]]  . saccharomyces boulardii (FLORASTOR) 250  MG capsule    Sig: Take 1 capsule (250 mg total) by mouth 2 (two) times daily.    Dispense:  20 capsule    Refill:  0    Order Specific Question:   Supervising Provider    Answer:   [place] Anger [[id number]]    Follow-up: Return if symptoms worsen or fail to improve.  Wilfred Lacy, NP

## 2016-05-27 NOTE — Patient Instructions (Signed)
Complete doxycycline course prior to taking diflucan tablet. Start florastor and metronidazole gel today.   Bacterial Vaginosis Bacterial vaginosis is an infection of the vagina. It happens when too many germs (bacteria) grow in the vagina. This infection puts you at risk for infections from sex (STIs). Treating this infection can lower your risk for some STIs. You should also treat this if you are pregnant. It can cause your baby to be born early. Follow these instructions at home: Medicines  Take over-the-counter and prescription medicines only as told by your doctor.  Take or use your antibiotic medicine as told by your doctor. Do not stop taking or using it even if you start to feel better. General instructions  If you your sexual partner is a woman, tell her that you have this infection. She needs to get treatment if she has symptoms. If you have a female partner, he does not need to be treated.  During treatment:  Avoid sex.  Do not douche.  Avoid alcohol as told.  Avoid breastfeeding as told.  Drink enough fluid to keep your pee (urine) clear or pale yellow.  Keep your vagina and butt (rectum) clean.  Wash the area with warm water every day.  Wipe from front to back after you use the toilet.  Keep all follow-up visits as told by your doctor. This is important. Preventing this condition  Do not douche.  Use only warm water to wash around your vagina.  Use protection when you have sex. This includes:  Latex condoms.  Dental dams.  Limit how many people you have sex with. It is best to only have sex with the same person (be monogamous).  Get tested for STIs. Have your partner get tested.  Wear underwear that is cotton or lined with cotton.  Avoid tight pants and pantyhose. This is most important in summer.  Do not use any products that have nicotine or tobacco in them. These include cigarettes and e-cigarettes. If you need help quitting, ask your doctor.  Do  not use illegal drugs.  Limit how much alcohol you drink. Contact a doctor if:  Your symptoms do not get better, even after you are treated.  You have more discharge or pain when you pee (urinate).  You have a fever.  You have pain in your belly (abdomen).  You have pain with sex.  Your bleed from your vagina between periods. Summary  This infection happens when too many germs (bacteria) grow in the vagina.  Treating this condition can lower your risk for some infections from sex (STIs).  You should also treat this if you are pregnant. It can cause early (premature) birth.  Do not stop taking or using your antibiotic medicine even if you start to feel better. This information is not intended to replace advice given to you by your health care provider. Make sure you discuss any questions you have with your health care provider. Document Released: 01/20/2008 Document Revised: 12/27/2015 Document Reviewed: 12/27/2015 Elsevier Interactive Patient Education  2017 [employer].

## 2016-05-31 ENCOUNTER — Encounter: Payer: Self-pay | Admitting: Internal Medicine

## 2016-05-31 ENCOUNTER — Ambulatory Visit (INDEPENDENT_AMBULATORY_CARE_PROVIDER_SITE_OTHER): Payer: Medicare Other | Admitting: Internal Medicine

## 2016-05-31 VITALS — BP 142/86 | HR 90 | Temp 98.1°F | Ht 61.0 in | Wt 221.0 lb

## 2016-05-31 DIAGNOSIS — M7989 Other specified soft tissue disorders: Secondary | ICD-10-CM | POA: Diagnosis not present

## 2016-05-31 NOTE — Progress Notes (Signed)
Pre visit review using our clinic review tool, if applicable. No additional management support is needed unless otherwise documented below in the visit note. 

## 2016-05-31 NOTE — Progress Notes (Signed)
   Subjective:    Patient ID: Brandy Cameron, female    DOB: 1935-06-05, 81 y.o.   MRN: [id number]  HPI The patient is a 81 YO female coming in for left leg swelling going on for about 2-3 weeks (or months she was not sure). She has had swelling in her legs off and on for years now since partial knee replacement. She is currently having lung cancer but cannot get treatment due to poor lung function. She is noticing some clear fluid draining from her left leg in the last several weeks. It is also hurting more in the last 2-3 weeks. The right leg swells as well but not as bad. She bought some otc compression stockings but does not wear them as she does not know if they fit. Denies SOB on exertion change recently.  Review of Systems  Constitutional: Positive for activity change. Negative for appetite change, chills, fatigue, fever and unexpected weight change.  HENT: Negative.   Eyes: Negative.   Respiratory: Positive for cough and shortness of breath. Negative for chest tightness and wheezing.   Cardiovascular: Positive for leg swelling. Negative for chest pain and palpitations.  Gastrointestinal: Negative.   Musculoskeletal: Negative.   Skin: Negative.       Objective:   Physical Exam  Constitutional: She is oriented to person, place, and time. She appears well-developed and well-nourished. No distress.  HENT:  Head: Normocephalic and atraumatic.  Eyes: EOM are normal.  Neck: Normal range of motion.  Cardiovascular: Normal rate and regular rhythm.   Pulmonary/Chest: Effort normal. No respiratory distress. She has no wheezes. She has no rales.  Abdominal: Soft. She exhibits no distension. There is no tenderness. There is no rebound.  Musculoskeletal: She exhibits edema.  Bilateral edema 1+ without drainage or cellulitis. No rash, some calf tenderness on the left calf  Neurological: She is alert and oriented to person, place, and time.  Skin: Skin is warm and dry.   Vitals:   05/31/16  1539  BP: (!) 142/86  Pulse: 90  Temp: 98.1 F (36.7 C)  TempSrc: Oral  SpO2: 98%  Weight: 221 lb (100.2 kg)  Height: '5\' 1"'$  (1.549 m)      Assessment & Plan:

## 2016-05-31 NOTE — Patient Instructions (Signed)
We have given you the compression stocking prescription.    We have ordered the ultrasound and you will get a call about scheduling.

## 2016-06-01 DIAGNOSIS — M7989 Other specified soft tissue disorders: Secondary | ICD-10-CM | POA: Insufficient documentation

## 2016-06-01 NOTE — Assessment & Plan Note (Signed)
Due to ongoing issues with lung cancer this patient is moderate risk for DVT. Rx for compression stockings and checking [place] bilateral for DVT.

## 2016-06-03 ENCOUNTER — Ambulatory Visit (HOSPITAL_COMMUNITY)
Admission: RE | Admit: 2016-06-03 | Discharge: 2016-06-03 | Disposition: A | Discharge status: Home / Self Care | Payer: Medicare Other | Source: Ambulatory Visit | Attending: Cardiology | Admitting: Cardiology

## 2016-06-03 DIAGNOSIS — M7989 Other specified soft tissue disorders: Secondary | ICD-10-CM | POA: Diagnosis not present

## 2016-06-08 ENCOUNTER — Other Ambulatory Visit: Payer: Self-pay | Admitting: Internal Medicine

## 2016-06-08 DIAGNOSIS — I2510 Atherosclerotic heart disease of native coronary artery without angina pectoris: Secondary | ICD-10-CM

## 2016-06-13 DIAGNOSIS — Z85118 Personal history of other malignant neoplasm of bronchus and lung: Secondary | ICD-10-CM | POA: Diagnosis not present

## 2016-06-13 DIAGNOSIS — J449 Chronic obstructive pulmonary disease, unspecified: Secondary | ICD-10-CM | POA: Diagnosis not present

## 2016-06-13 DIAGNOSIS — R0602 Shortness of breath: Secondary | ICD-10-CM | POA: Diagnosis not present

## 2016-06-16 ENCOUNTER — Ambulatory Visit: Payer: Medicare Other | Admitting: Pulmonary Disease

## 2016-06-16 DIAGNOSIS — Z85118 Personal history of other malignant neoplasm of bronchus and lung: Secondary | ICD-10-CM | POA: Diagnosis not present

## 2016-06-16 DIAGNOSIS — R0602 Shortness of breath: Secondary | ICD-10-CM | POA: Diagnosis not present

## 2016-06-16 DIAGNOSIS — J449 Chronic obstructive pulmonary disease, unspecified: Secondary | ICD-10-CM | POA: Diagnosis not present

## 2016-07-08 ENCOUNTER — Encounter: Payer: Self-pay | Admitting: Pulmonary Disease

## 2016-07-08 ENCOUNTER — Ambulatory Visit (INDEPENDENT_AMBULATORY_CARE_PROVIDER_SITE_OTHER): Payer: Medicare Other | Admitting: Pulmonary Disease

## 2016-07-08 VITALS — BP 140/80 | HR 94 | Ht 61.0 in | Wt 218.4 lb

## 2016-07-08 DIAGNOSIS — J9611 Chronic respiratory failure with hypoxia: Secondary | ICD-10-CM

## 2016-07-08 DIAGNOSIS — J441 Chronic obstructive pulmonary disease with (acute) exacerbation: Secondary | ICD-10-CM

## 2016-07-08 DIAGNOSIS — J309 Allergic rhinitis, unspecified: Secondary | ICD-10-CM

## 2016-07-08 MED ORDER — ALBUTEROL SULFATE (2.5 MG/3ML) 0.083% IN NEBU: 2.5000 mg | INHALATION_SOLUTION | RESPIRATORY_TRACT | 3 refills | Status: DC | PRN

## 2016-07-08 MED ORDER — ALBUTEROL SULFATE HFA 108 (90 BASE) MCG/ACT IN AERS: 2.0000 | INHALATION_SPRAY | RESPIRATORY_TRACT | 3 refills | Status: DC | PRN

## 2016-07-08 MED ORDER — PREDNISONE 10 MG PO TABS: ORAL_TABLET | ORAL | 0 refills | Status: DC

## 2016-07-08 NOTE — Patient Instructions (Signed)
   Continue taking your Symbicort twice daily.  Please start using your Atrovent/Ipratroprium Bromide in your nebulizer three times daily as prescribed. This can be taken at the same time as your Symbicort.  I'm sending in a prescription for Albuterol to use in your nebulizer every 4 hours as needed for any coughing, wheezing or shortness of breath. This can be combined with your Atrovent if you feel you need to use the Albuterol at the same time.  Start taking an over-the-counter allergy medicine like Allegra, Claritin, or Zyrtec. The generic is fine. This will help your allergies and breathing.  You can use your Flonase nasal spray twice a day - use only one spray in each nostril twice daily at most.  I'm starting you on a Prednisone Taper:  Take 6 pills daily for 3 days, then  Take 5 pills daily for 3 days, then  Take 4 pills daily for 3 days, then  Take 3 pills daily for 3 days, then   Take 2 pills daily for 3 days, then  Take 1 pill daily for 3 days/until gone  Call me if you feel your breathing is not getting any better.

## 2016-07-08 NOTE — Progress Notes (Signed)
Subjective:    Patient ID: Brandy Cameron, female    DOB: April 29, 1935, 81 y.o.   MRN: [id number]  C.C.:  Follow-up for Moderate-Severe COPD, Chronic Hypoxic Respiratory Failure, GERD w/ Dysphagia, & Chronic Allergic Rhinitis, & Bilateral Lung Nodules.  HPI  Moderate-severe COPD: Previously on Perforomist and Pulmicort. Switched over to Symbicort twice daily & Atrovent nebulizers 3 times a day at last appointment. Patient also started on airway clearance with flutter valve and Mucinex. She reports her last steroids or antibiotics was in December. She reports she has noticed a cough starting last night. Her cough produces a thick mucus. She does feel increased dyspnea. Reports she is compliant with her regimen but then she is only using the Atrovent once daily.   Chronic hypoxic respiratory failure: Patient instructed not to use oxygen at rest at last appointment. Prescribed oxygen at 2 L/m with exertion and while sleeping. Patient is compliant with oxygen therapy.   GERD with dysphagia: Currently prescribed Zantac. Previously stopped Nexium due to nausea. No reflux, dyspepsia or morning brash water taste.   Chronic allergic rhinitis: Previously prescribed Singulair but had adverse reaction to medication. She has noticed more sinus congestion, pressure, & drainage. She is currently on  Flonase OTC but is only using it once daily.   Bilateral lung nodules: Hypermetabolic on PET/CT imaging. Has seen medical oncology and radiation oncology. Previously declined treatment.  Review of Systems She reports she has had some chills. No fever or sweats. No chest tightness or pressure. No headaches or near syncope. She has had some palpitations with exertion.   Allergies  Allergen Reactions  . Augmentin [Amoxicillin-Pot Clavulanate] Diarrhea  . Sulfa Antibiotics Rash and Shortness Of Breath  . Amlodipine   . Pantoprazole Nausea And Vomiting  . Sulfonamide Derivatives Rash    Current Outpatient  Prescriptions on File Prior to Visit  Medication Sig Dispense Refill  . aspirin 81 MG tablet Take 81 mg by mouth daily.      Marland Kitchen atorvastatin (LIPITOR) 20 MG tablet TAKE 1 TABLET(20 MG) BY MOUTH DAILY 90 tablet 1  . budesonide-formoterol (SYMBICORT) 160-4.5 MCG/ACT inhaler Inhale 2 puffs into the lungs 2 (two) times daily.    . calcium carbonate 1250 MG capsule Take 1,250 mg by mouth daily.     . Coenzyme Q10 (CO Q-10) 100 MG CAPS Take 1 tablet by mouth daily.    . cromolyn (OPTICROM) 4 % ophthalmic solution INT 1 GTT INTO OU QID  12  . dextromethorphan-guaiFENesin (MUCINEX DM) 30-600 MG 12hr tablet Take 1 tablet by mouth 2 (two) times daily.    . fluticasone (FLONASE) 50 MCG/ACT nasal spray Place 2 sprays into both nostrils daily. 16 g 5  . furosemide (LASIX) 40 MG tablet TAKE 1 TABLET BY MOUTH DAILY 30 tablet 5  . ipratropium (ATROVENT) 0.02 % nebulizer solution Take 2.5 mLs (0.5 mg total) by nebulization 3 (three) times daily. DX code J44.9 225 mL 6  . losartan (COZAAR) 100 MG tablet Take 1 tablet (100 mg total) by mouth daily. Yearly physical due in June must see MD for future refills 90 tablet 1  . metoprolol tartrate (LOPRESSOR) 25 MG tablet TAKE 1 TABLET BY MOUTH TWICE DAILY 60 tablet 11  . nitroGLYCERIN (NITROSTAT) 0.4 MG SL tablet Place 1 tablet (0.4 mg total) under the tongue every 5 (five) minutes as needed for chest pain. 25 tablet 12  . OXYGEN Inhale into the lungs. 2 liters at nights    . potassium chloride  SA (K-DUR,KLOR-CON) 20 MEQ tablet TAKE 1 TABLET BY MOUTH DAILY 90 tablet 1  . Spacer/Aero-Holding Chambers (AEROCHAMBER MV) inhaler Use as instructed 1 each 0  . doxycycline (VIBRAMYCIN) 100 MG capsule Take 1 capsule (100 mg total) by mouth 2 (two) times daily. Take with food. (Patient not taking: Reported on 07/08/2016) 14 capsule 0  . fluconazole (DIFLUCAN) 200 MG tablet     . metroNIDAZOLE (METROGEL) 0.75 % vaginal gel Place 1 Applicatorful vaginally 2 (two) times daily. (Patient  not taking: Reported on 07/08/2016) 70 g 0  . predniSONE (DELTASONE) 20 MG tablet Take one tab by mouth twice daily for 5 days, then one daily for 3 days. Take with food. (Patient not taking: Reported on 07/08/2016) 13 tablet 0  . ranitidine (ZANTAC) 150 MG tablet Take 1 tablet (150 mg total) by mouth at bedtime. (Patient not taking: Reported on 07/08/2016) 30 tablet 2  . saccharomyces boulardii (FLORASTOR) 250 MG capsule Take 1 capsule (250 mg total) by mouth 2 (two) times daily. (Patient not taking: Reported on 07/08/2016) 20 capsule 0   No current facility-administered medications on file prior to visit.     Past Medical History:  Diagnosis Date  . Asthma   . CAD (coronary artery disease)    a. LHC 9/11 (in setting of NSTEMI): LAD irregs, mLCx 50, pRCA 50, inf-apical AK;  b. Myoview 9/13: Normal stress nuclear study.  LV Ejection Fraction: 71%   . Carotid stenosis    a. Carotid US 9/13: bilat 0-39%;  b. Carotid US 7/15: no sig stenosis  . Chronic diastolic CHF (congestive heart failure) ([place])    a. Echo 7/15: Mild LVH, mild focal basal septal hypertrophy, EF 50-55%, no RWMA, Gr 1 DD  . COPD (chronic obstructive pulmonary disease) ([place])   . HTN (hypertension)   . Lung cancer [facility])    [name]  Marland Kitchen NSTEMI (non-ST elevated myocardial infarction) ([place])    with only mild to moderate CAD in September of 2011,There was concern for apival ballooning. >> ? Tako-Tsubo syndrome vs vasospasm    Past Surgical History:  Procedure Laterality Date  . CATARACT SURGERY    . FOOT SURGERY    . GALLBLADDER SURGERY    . KNEE SURGERY    . LUNG LOBECTOMY    . VESICOVAGINAL FISTULA CLOSURE W/ TAH      Family History  Problem Relation Age of Onset  . Heart disease Mother 47  . Asthma Mother   . Diabetes Mother   . Cancer Mother     Salivary  . Emphysema Mother   . Heart disease      POSITIVE FAMILY HX. OF   .  Pancreatitis      QUESTIONABLE FROM ACE INHIBITORS  . COPD Sister     Social History   Social History  . Marital status: Divorced    Spouse name: N/A  . Number of children: N/A  . Years of education: N/A   Social History Main Topics  . Smoking status: Former Smoker    Packs/day: 1.50    Years: 42.00    Types: Cigarettes    Quit date: 04/26/2006  . Smokeless tobacco: Never Used  . Alcohol use No  . Drug use: No  . Sexual activity: No   Other Topics Concern  . None   Social History Narrative   Originally from [place]. Always lived in [place]. 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 Has a dog at home. No birds, mold, or hot tub exposure. No indoor plants. No carpet in her home. Does have draperies.       Objective:   Physical Exam BP 140/80 (BP Location: Right Arm, Patient Position: Sitting, Cuff Size: Large)   Pulse 94   Ht '5\' 1"'$  (1.549 m)   Wt 218 lb 6.4 oz (99.1 kg)   SpO2 96%   BMI 41.27 kg/m   Gen.: Obese female. No acute distress. Sr. with her again today. Integument: No rash or bruising on exposed skin. Warm and dry. Lymphatics: No cervical or supraclavicular lymphadenopathy. HEENT: Moist mucous membranes. No scleral icterus. No oral ulcers. Cardiovascular: Trace lower extremity edema. Regular rate. No appreciable JVD. Pulmonary: Decreased aeration bilaterally with mild end expiratory wheeze. No increased work of breathing on nasal cannula oxygen. Abdomen: Soft. Protuberant. Normal bowel sounds.  PFT 01/05/16: FVC 1.03 L (43%) FEV1 0.76 L (43%) FEV1/FVC 0.74 FEF 25-75 0.56 L (41%) positive bronchodilator response TLC 4.05 L (86%) RV 117% ERV 15% DLCO corrected 31% (Hgb 13.7) 11/28/15: FVC 1.09 L (45%) FEV1 0.81 L (45%) FEV1/FVC 0.74 FEF 25-75 0.66 L (49%) positive bronchodilator response TLC 3.90 L (83%) RV 121% ERV 100% DLCO corrected 64% (Hgb 14.6)  6MWT 03/10/16:  Walked 4 minutes / Baseline Sat 98% on RA / Nadir  Sat 93% on RA @ 4:00 ( 10/14/15:  Baseline Sat 90% on RA at rest / Nadir Sat 87% on RA (required 2 L/m with exertion to maintain & test stopped early due to dyspnea and chest discomfort)  IMAGING PET CT 11/11/15 (previously reviewed by me): Medial left upper lobe patchy hypermetabolic opacity with maximal dimension 4.7 cm & max SUV 11.8. Right upper lobe spiculated nodule with maximal dimension 1.6 cm with max SUV 2.8. 6 mm right lower lobe nodule noted. No other areas of hypermetabolism or adenopathy appreciated.  BARIUM SWALLOW 09/08/15 (per radiologist): Patent esophagus. No stricture or mass. Small hiatal hernia. Mildly abnormal motility. No reflux.  MAXILLOFACIAL CT W/O 09/02/15 (per radiologist): Mild mucosal thickening inferior lateral/posterior aspect right maxillary sinus. No air-fluid levels.  CT  CHEST W/O 08/04/15 (previously reviewed by me): Volume loss with postsurgical change in left lung. Low-attenuation soft tissue density along posterior & superior aspect surgical margin. 9 mm irregular nodule right lung apex. 4 mm triangular groundglass nodule right minor fissure. 6 mm nodule right lower lobe. Or millimeter nodule right middle lobe. Additional tiny bilateral pulmonary nodules. No pleural effusion or thickening. No pathologic hilar, mediastinal, or axillary lymphadenopathy. Precarinal lymph node with a fatty hilum. Nodule in right apex is definitely new when compared with prior imaging.   CXR PA/LAT 01/01/15 (previously reviewed by me): Blunting of right posterior cardiac angle. No pleural effusion appreciated. Fullness in the left hilum that appears chronic and is likely residual scar tissue from prior surgery. No new nodule or opacity appreciated. Heart normal in size. Mediastinum otherwise normal in contour.   CT CHEST W/ 12/15/11 (previously reviewed by me): 6 mm nodule in right lower lobe that is noncalcified and rounded per radiologist present since 01/08/09 & unchanged. No pathologic  mediastinal or hilar adenopathy. Soft tissue within the anterior mediastinum corresponding to fullness in the left hilum. Subcentimeter nodular density in left upper lobe reportedly unchanged. Hepatic steatosis noted.   CARDIAC EKG 03/10/16 (personally reviewed by me): Normal sinus rhythm. No Q waves, ST segment changes, or  T-wave changes that would suggest ischemia.  EKG 10/14/15 (previously reviewed by me): Normal sinus rhythm. QTC 403 ms. Isoelectric T-wave lead V2. Artifact lead V5. Otherwise no signs of ischemia or conduction abnormality.  TTE (07/04/15): LV normal in size with EF 55%. No regional wall motion abnormalities. Grade 1 diastolic dysfunction. LA & RA normal in size. RV normal in size and function. No aortic stenosis or regurgitation. No mitral stenosis. No pulmonic regurgitation. No significant tricuspid regurgitation. No pericardial effusion.  NUCLEAR STRESS TEST (07/03/15): Normal study with low risk. No T-wave inversion or ST segment deviation. EF 66% with hyperdynamic LV.  PATHOLOGY L Parotid Gland FNA (11/18/14): Benign fibrofatty soft tissue. No features of salivary gland present. LUL Wedge Resection (02/10/09): Invasive adenocarcinoma 2.0 cm LUL Additional Parenchymal Margin (02/10/09): No tumor identified 4L Lymph Node (02/10/09): No tumor identified 10L Lymph Node (02/10/09): No tumor identified  MICROBIOLOGY SPUTUM CTX (09/03/15):  AFB ngtd / Fungal ngtd  LABS 08/29/15 IgE:  15 RAST Panel: [place] 0.14 / [place] 0.12 / [place] 0.15 / [place] 0.12  06/20/15 BMP: 143/4.7/99/33/14/0.71/88/9.4 BNP: 62  01/16/15 CBC: 16.7/15.0/45.6/161 ProBNP: 33  02/06/09 ABG on RA: 7.395 / 42.7 / 125 / 98.6%    Assessment & Plan:  81 y.o. female with moderate-severe COPD, chronic hypoxic respiratory failure, GERD with dysphagia, chronic allergic rhinitis, & bilateral lung nodules likely representing recurrent malignancy. Patient previously declined treatment. Patient is in  the midst of an exacerbation of her COPD. I suspect there is a strong allergic component given worsening in her allergic rhinitis. Previously she has been intolerant of Singulair. She is also somewhat confused about her appropriate nebulizer and inhaler regimen. I did clarify her medication frequency and dosing with her today during her visit. I instructed the patient contact my office if she had any new breathing problems or questions before her next appointment.  1. Moderate-severe COPD with Exacerbation:  Starting patient on Prednisone taper. Patient educated extensively on frequency of use of Atrovent in her nebulizer. Also prescribing her albuterol to use as needed in both her nebulizer and as a rescue inhaler. 2. Chronic hypoxic respiratory failure: Continuing on oxygen as previously prescribed. 3. GERD with dysphagia: Continuing on Zantac. No changes. 4. Chronic allergic rhinitis: Increasing over-the-counter Flonase to one spray each nostril twice daily. Recommended trying over-the-counter antihistamine therapy. 5. Bilateral lung nodules: Likely recurrent malignancy. Patient has declined treatment. 6. Health maintenance:  S/P Influenza Vaccine October 2017, Tdap October 2017, Prevnar September 2016 & Pneumovax September 2017. 7. Follow-up: Return to clinic in 4 weeks or sooner if needed.   Sonia Baller Ashok Cordia, M.D. [facility] Pager:  [phone] After 3pm or if no response, call [phone] 2:54 PM 07/08/16

## 2016-07-08 NOTE — Addendum Note (Signed)
Addended by: Tyson Dense on: 07/08/2016 04:21 PM   Modules accepted: Orders

## 2016-07-11 DIAGNOSIS — J449 Chronic obstructive pulmonary disease, unspecified: Secondary | ICD-10-CM | POA: Diagnosis not present

## 2016-07-11 DIAGNOSIS — R0602 Shortness of breath: Secondary | ICD-10-CM | POA: Diagnosis not present

## 2016-07-11 DIAGNOSIS — Z85118 Personal history of other malignant neoplasm of bronchus and lung: Secondary | ICD-10-CM | POA: Diagnosis not present

## 2016-07-14 ENCOUNTER — Other Ambulatory Visit: Payer: Self-pay | Admitting: Internal Medicine

## 2016-07-14 DIAGNOSIS — J449 Chronic obstructive pulmonary disease, unspecified: Secondary | ICD-10-CM | POA: Diagnosis not present

## 2016-07-14 DIAGNOSIS — R0602 Shortness of breath: Secondary | ICD-10-CM | POA: Diagnosis not present

## 2016-07-14 DIAGNOSIS — Z85118 Personal history of other malignant neoplasm of bronchus and lung: Secondary | ICD-10-CM | POA: Diagnosis not present

## 2016-08-11 DIAGNOSIS — J449 Chronic obstructive pulmonary disease, unspecified: Secondary | ICD-10-CM | POA: Diagnosis not present

## 2016-08-11 DIAGNOSIS — Z85118 Personal history of other malignant neoplasm of bronchus and lung: Secondary | ICD-10-CM | POA: Diagnosis not present

## 2016-08-11 DIAGNOSIS — R0602 Shortness of breath: Secondary | ICD-10-CM | POA: Diagnosis not present

## 2016-08-13 ENCOUNTER — Encounter (HOSPITAL_COMMUNITY): Payer: Self-pay

## 2016-08-13 ENCOUNTER — Other Ambulatory Visit (HOSPITAL_BASED_OUTPATIENT_CLINIC_OR_DEPARTMENT_OTHER): Payer: Medicare Other

## 2016-08-13 ENCOUNTER — Ambulatory Visit (HOSPITAL_COMMUNITY)
Admission: RE | Admit: 2016-08-13 | Discharge: 2016-08-13 | Disposition: A | Discharge status: Home / Self Care | Payer: Medicare Other | Source: Ambulatory Visit | Attending: Internal Medicine | Admitting: Internal Medicine

## 2016-08-13 DIAGNOSIS — C3412 Malignant neoplasm of upper lobe, left bronchus or lung: Secondary | ICD-10-CM | POA: Diagnosis not present

## 2016-08-13 DIAGNOSIS — R911 Solitary pulmonary nodule: Secondary | ICD-10-CM | POA: Diagnosis not present

## 2016-08-13 DIAGNOSIS — J449 Chronic obstructive pulmonary disease, unspecified: Secondary | ICD-10-CM | POA: Diagnosis not present

## 2016-08-13 DIAGNOSIS — R918 Other nonspecific abnormal finding of lung field: Secondary | ICD-10-CM | POA: Insufficient documentation

## 2016-08-13 LAB — CBC WITH DIFFERENTIAL/PLATELET
BASO%: 0.5 % (ref 0.0–2.0)
BASOS ABS: 0 10*3/uL (ref 0.0–0.1)
EOS ABS: 0.3 10*3/uL (ref 0.0–0.5)
EOS%: 3.5 % (ref 0.0–7.0)
HCT: 42.6 % (ref 34.8–46.6)
HGB: 12.9 g/dL (ref 11.6–15.9)
LYMPH%: 28.6 % (ref 14.0–49.7)
MCH: 28.2 pg (ref 25.1–34.0)
MCHC: 30.3 g/dL — AB (ref 31.5–36.0)
MCV: 93.2 fL (ref 79.5–101.0)
MONO#: 0.9 10*3/uL (ref 0.1–0.9)
MONO%: 11.5 % (ref 0.0–14.0)
NEUT#: 4.3 10*3/uL (ref 1.5–6.5)
NEUT%: 55.9 % (ref 38.4–76.8)
PLATELETS: 266 10*3/uL (ref 145–400)
RBC: 4.57 10*6/uL (ref 3.70–5.45)
RDW: 14.1 % (ref 11.2–14.5)
WBC: 7.7 10*3/uL (ref 3.9–10.3)
lymph#: 2.2 10*3/uL (ref 0.9–3.3)

## 2016-08-13 LAB — COMPREHENSIVE METABOLIC PANEL
ALBUMIN: 3.3 g/dL — AB (ref 3.5–5.0)
ALK PHOS: 122 U/L (ref 40–150)
ALT: 22 U/L (ref 0–55)
ANION GAP: 9 meq/L (ref 3–11)
AST: 15 U/L (ref 5–34)
BUN: 17.7 mg/dL (ref 7.0–26.0)
CALCIUM: 9.4 mg/dL (ref 8.4–10.4)
CO2: 37 mEq/L — ABNORMAL HIGH (ref 22–29)
CREATININE: 0.7 mg/dL (ref 0.6–1.1)
Chloride: 101 mEq/L (ref 98–109)
EGFR: 77 mL/min/{1.73_m2} — ABNORMAL LOW (ref >90)
Glucose: 109 mg/dl (ref 70–140)
Potassium: 4.5 mEq/L (ref 3.5–5.1)
SODIUM: 147 meq/L — AB (ref 136–145)
TOTAL PROTEIN: 6.2 g/dL — AB (ref 6.4–8.3)
Total Bilirubin: 0.27 mg/dL (ref 0.20–1.20)

## 2016-08-13 MED ORDER — IOPAMIDOL (ISOVUE-300) INJECTION 61%
INTRAVENOUS | Status: AC
  Filled 2016-08-13: qty 75

## 2016-08-13 MED ORDER — IOPAMIDOL (ISOVUE-300) INJECTION 61%
75.0000 mL | Freq: Once | INTRAVENOUS | Status: AC | PRN
  Administered 2016-08-13: 75 mL via INTRAVENOUS

## 2016-08-14 DIAGNOSIS — R0602 Shortness of breath: Secondary | ICD-10-CM | POA: Diagnosis not present

## 2016-08-14 DIAGNOSIS — Z85118 Personal history of other malignant neoplasm of bronchus and lung: Secondary | ICD-10-CM | POA: Diagnosis not present

## 2016-08-14 DIAGNOSIS — J449 Chronic obstructive pulmonary disease, unspecified: Secondary | ICD-10-CM | POA: Diagnosis not present

## 2016-08-17 ENCOUNTER — Ambulatory Visit (HOSPITAL_BASED_OUTPATIENT_CLINIC_OR_DEPARTMENT_OTHER): Payer: Medicare Other | Admitting: Internal Medicine

## 2016-08-17 ENCOUNTER — Telehealth: Payer: Self-pay | Admitting: Internal Medicine

## 2016-08-17 ENCOUNTER — Ambulatory Visit: Payer: Medicare Other | Admitting: Pulmonary Disease

## 2016-08-17 ENCOUNTER — Encounter: Payer: Self-pay | Admitting: Internal Medicine

## 2016-08-17 ENCOUNTER — Telehealth: Payer: Self-pay | Admitting: Cardiology

## 2016-08-17 VITALS — BP 150/48 | HR 85 | Temp 98.1°F | Resp 19 | Ht 61.0 in | Wt 226.4 lb

## 2016-08-17 DIAGNOSIS — J449 Chronic obstructive pulmonary disease, unspecified: Secondary | ICD-10-CM | POA: Diagnosis not present

## 2016-08-17 DIAGNOSIS — Z85118 Personal history of other malignant neoplasm of bronchus and lung: Secondary | ICD-10-CM | POA: Diagnosis not present

## 2016-08-17 DIAGNOSIS — C3412 Malignant neoplasm of upper lobe, left bronchus or lung: Secondary | ICD-10-CM

## 2016-08-17 DIAGNOSIS — R911 Solitary pulmonary nodule: Secondary | ICD-10-CM | POA: Diagnosis not present

## 2016-08-17 DIAGNOSIS — I509 Heart failure, unspecified: Secondary | ICD-10-CM | POA: Diagnosis not present

## 2016-08-17 MED ORDER — FUROSEMIDE 40 MG PO TABS: 40.0000 mg | ORAL_TABLET | Freq: Every day | ORAL | 3 refills | Status: DC

## 2016-08-17 NOTE — Telephone Encounter (Signed)
Left message for patient to take the furosemide 40 mg bid and to call and make an appointment to follow up in [place].

## 2016-08-17 NOTE — Telephone Encounter (Signed)
New Message   Pt c/o swelling: STAT is pt has developed SOB within 24 hours  1. How long have you been experiencing swelling? About 2 weeks  2. Where is the swelling located? Legs,hands, and feet  3.  Are you currently taking a "fluid pill"? Yes  4.  Are you currently SOB? Yes  5.  Have you traveled recently? No

## 2016-08-17 NOTE — Telephone Encounter (Signed)
Gave patient avs report and appointments for October. Central radiology will call re scan.  °

## 2016-08-17 NOTE — Telephone Encounter (Signed)
Patient called in  She states she was told by Dr. Stanford Breed at her last visit (07/2015) to take lasix '40mg'$  twice daily She states she never got an Rx for 60 tablets -- her PCP Rx'ed for 30 tablets only last month so she has only been taking QD She states she has swelling "all over" - legs, feet, face, hands She states she can't breathe (h/o lung cancer, CHF, chronic resp failure) She states this has been going on for 3 weeks She never notified our office that she did not have an Rx for 60 tablets of lasix for BID dosing or that she was short of breath/swelling for a few weeks.  Rx(s) sent to pharmacy electronically for lasix '40mg'$  BID Advised to take as directed and call if no better by tomorrow.  Advised that the monitor weights  She is aware she is due for an appointment with Dr. Stanford Breed  Will route to MD

## 2016-08-17 NOTE — Telephone Encounter (Signed)
Not seen in one year; needs fu ov Kirk Ruths

## 2016-08-17 NOTE — Progress Notes (Signed)
[place] [phone]   [phone]) [phone]  OFFICE PROGRESS NOTE  Brandy Calico, MD [address] [facility] [address] [place] [address]  DIAGNOSIS: recurrent non-small cell lung cancer, adenocarcinoma with positive PDL 1 expression (60%) with left upper lobe as well as right upper lobe pulmonary nodules. She was initially diagnosed with a stage IA (T1a, N0, M0) in in October 2010.   Molecular studies USAA one): Genomic Alterations Identified? KRAS Q61L RBM10 [id number]* Additional Findings? Microsatellite status MS-Stable Tumor Mutation Burden Cannot Be Determined Additional Disease-relevant Genes with No Reportable Alterations Identified? EGFR ALK BRAF MET RET ERBB2 ROS1  PRIOR THERAPY: 1) status post wedge resection with seed implants.    CURRENT THERAPY: Observation.  INTERVAL HISTORY: Brandy Cameron 80 y.o. female returns to the clinic today for follow-up visit accompanied by her friend. The patient is feeling fine today with no specific complaints except for the persistent shortness of breath and swelling of her lower extremities secondary to COPD and congestive heart failure. She denied having any chest pain, cough or hemoptysis. She denied having any fever or chills. She has no nausea, vomiting, diarrhea or constipation. She had repeat CT scan of the chest performed recently and she is here for evaluation and discussion of her scan results.  MEDICAL HISTORY: Past Medical History:  Diagnosis Date  . Asthma   . CAD (coronary artery disease)    a. LHC 9/11 (in setting of NSTEMI): LAD irregs, mLCx 50, pRCA 50, inf-apical AK;  b. Myoview 9/13: Normal stress nuclear study.  LV Ejection Fraction: 71%   . Carotid stenosis    a. Carotid US 9/13: bilat 0-39%;  b. Carotid US 7/15: no sig stenosis  . Chronic diastolic CHF (congestive heart failure) ([place])    a. Echo 7/15: Mild LVH, mild focal basal septal hypertrophy, EF 50-55%, no RWMA, Gr 1  DD  . COPD (chronic obstructive pulmonary disease) ([place])   . HTN (hypertension)   . Lung cancer [facility])    [name]  Marland Kitchen NSTEMI (non-ST elevated myocardial infarction) ([place])    with only mild to moderate CAD in September of 2011,There was concern for apival ballooning. >> ? Tako-Tsubo syndrome vs vasospasm    ALLERGIES:  is allergic to augmentin [amoxicillin-pot clavulanate]; sulfa antibiotics; amlodipine; pantoprazole; and sulfonamide derivatives.  MEDICATIONS:  Current Outpatient Prescriptions  Medication Sig Dispense Refill  . albuterol (PROVENTIL HFA;VENTOLIN HFA) 108 (90 Base) MCG/ACT inhaler Inhale 2 puffs into the lungs every 4 (four) hours as needed for wheezing or shortness of breath. 1 Inhaler 3  . albuterol (PROVENTIL) (2.5 MG/3ML) 0.083% nebulizer solution Take 3 mLs (2.5 mg total) by nebulization every 4 (four) hours as needed for wheezing or shortness of breath. 120 mL 3  . aspirin 81 MG tablet Take 81 mg by mouth daily.      Marland Kitchen atorvastatin (LIPITOR) 20 MG tablet TAKE 1 TABLET(20 MG) BY MOUTH DAILY 90 tablet 1  . budesonide-formoterol (SYMBICORT) 160-4.5 MCG/ACT inhaler Inhale 2 puffs into the lungs 2 (two) times daily.    . calcium carbonate 1250 MG capsule Take 1,250 mg by mouth daily.     . Coenzyme Q10 (CO Q-10) 100 MG CAPS Take 1 tablet by mouth daily.    . cromolyn (OPTICROM) 4 % ophthalmic solution INT 1 GTT INTO OU QID  12  . dextromethorphan-guaiFENesin (MUCINEX DM) 30-600 MG 12hr  tablet Take 1 tablet by mouth 2 (two) times daily.    . fluconazole (DIFLUCAN) 200 MG tablet     . fluticasone (FLONASE) 50 MCG/ACT nasal spray Place 2 sprays into both nostrils daily. 16 g 5  . furosemide (LASIX) 40 MG tablet TAKE 1 TABLET BY MOUTH DAILY 30 tablet 1  . ipratropium (ATROVENT) 0.02 % nebulizer solution Take 2.5 mLs (0.5 mg total) by nebulization 3 (three) times daily. DX code J44.9 225 mL 6  .  losartan (COZAAR) 100 MG tablet Take 1 tablet (100 mg total) by mouth daily. Yearly physical due in June must see MD for future refills 90 tablet 1  . metoprolol tartrate (LOPRESSOR) 25 MG tablet TAKE 1 TABLET BY MOUTH TWICE DAILY 60 tablet 11  . OXYGEN Inhale into the lungs. 2 liters at nights    . potassium chloride SA (K-DUR,KLOR-CON) 20 MEQ tablet TAKE 1 TABLET BY MOUTH DAILY 90 tablet 1  . Spacer/Aero-Holding Chambers (AEROCHAMBER MV) inhaler Use as instructed 1 each 0  . metroNIDAZOLE (METROGEL) 0.75 % vaginal gel Place 1 Applicatorful vaginally 2 (two) times daily. (Patient not taking: Reported on 07/08/2016) 70 g 0  . nitroGLYCERIN (NITROSTAT) 0.4 MG SL tablet Place 1 tablet (0.4 mg total) under the tongue every 5 (five) minutes as needed for chest pain. (Patient not taking: Reported on 08/17/2016) 25 tablet 12  . saccharomyces boulardii (FLORASTOR) 250 MG capsule Take 1 capsule (250 mg total) by mouth 2 (two) times daily. (Patient not taking: Reported on 07/08/2016) 20 capsule 0   No current facility-administered medications for this visit.     SURGICAL HISTORY:  Past Surgical History:  Procedure Laterality Date  . CATARACT SURGERY    . FOOT SURGERY    . GALLBLADDER SURGERY    . KNEE SURGERY    . LUNG LOBECTOMY    . VESICOVAGINAL FISTULA CLOSURE W/ TAH      REVIEW OF SYSTEMS:  A comprehensive review of systems was negative except for: Constitutional: positive for fatigue Respiratory: positive for dyspnea on exertion   PHYSICAL EXAMINATION: General appearance: alert, cooperative, fatigued and no distress Head: Normocephalic, without obvious abnormality, atraumatic Neck: no adenopathy, no JVD, supple, symmetrical, trachea midline and thyroid not enlarged, symmetric, no tenderness/mass/nodules Lymph nodes: Cervical, supraclavicular, and axillary nodes normal. Resp: diminished breath sounds bilaterally Back: symmetric, no curvature. ROM normal. No CVA tenderness. Cardio: regular  rate and rhythm, S1, S2 normal, no murmur, click, rub or gallop GI: soft, non-tender; bowel sounds normal; no masses,  no organomegaly Extremities: edema 1+  ECOG PERFORMANCE STATUS: 1 - Symptomatic but completely ambulatory  Blood pressure (!) 150/48, pulse 85, temperature 98.1 F (36.7 C), temperature source Oral, resp. rate 19, height '5\' 1"'$  (1.549 m), weight 226 lb 6.4 oz (102.7 kg), SpO2 96 %.  LABORATORY DATA: Lab Results  Component Value Date   WBC 7.7 08/13/2016   HGB 12.9 08/13/2016   HCT 42.6 08/13/2016   MCV 93.2 08/13/2016   PLT 266 08/13/2016      Chemistry      Component Value Date/Time   NA 147 (H) 08/13/2016 1154   K 4.5 08/13/2016 1154   CL 105 02/06/2016 1251   CL 101 12/15/2011 0920   CO2 37 (H) 08/13/2016 1154   BUN 17.7 08/13/2016 1154   CREATININE 0.7 08/13/2016 1154      Component Value Date/Time   CALCIUM 9.4 08/13/2016 1154   ALKPHOS 122 08/13/2016 1154   AST 15 08/13/2016 1154  ALT 22 08/13/2016 1154   BILITOT 0.27 08/13/2016 1154       RADIOGRAPHIC STUDIES: Ct Chest W Contrast  Result Date: 08/13/2016 CLINICAL DATA:  Status post left upper lobectomy for lung cancer. EXAM: CT CHEST WITH CONTRAST TECHNIQUE: Multidetector CT imaging of the chest was performed during intravenous contrast administration. CONTRAST:  92m ISOVUE-300 IOPAMIDOL (ISOVUE-300) INJECTION 61% COMPARISON:  03/30/2016 FINDINGS: Cardiovascular: The heart size is normal. No pericardial effusion. Coronary artery calcification is noted. No thoracic aortic aneurysm. Mediastinum/Nodes: No mediastinal lymphadenopathy. There is no hilar lymphadenopathy. The esophagus has normal imaging features. There is no axillary lymphadenopathy. Lungs/Pleura: 16 x 10 mm right apical nodule is stable. Volume loss again noted left hemithorax. Post treatment changes noted anterior and medial aspect of the remaining left lung. Irregular medial left lung opacity measured previously towards the apex at 4.9  x 1.7 cm now measures 4.8 x 1.6 cm peripheral left lung nodule measuring about 2 mm on image 50 is stable. 7 mm left lower lobe nodule (image 89 series 5) is unchanged. No pulmonary edema or pleural effusion. Upper Abdomen: Stable cyst upper pole left kidney. Tiny hiatal hernia. Musculoskeletal: Bone windows reveal no worrisome lytic or sclerotic osseous lesions. IMPRESSION: 1. Stable exam compared to 03/30/2016. The irregular opacity in the left suprahilar region is unchanged, but was hypermetabolic on previous PET imaging and continued close attention recommended. 2. Stable right apical pulmonary nodule. Continued close follow-up recommended as this also showed low level FDG accumulation. Electronically Signed   By: EMisty StanleyM.D.   On: 08/13/2016 15:59    ASSESSMENT AND PLAN:  This is a very pleasant 81years old white female with recurrent non-small cell lung cancer, adenocarcinoma diagnosed in 2010 as a stage IA status post left upper lobe wedge resection with seed implants and has been on observation since that time. The patient continues to have right apical nodule suspicious for disease recurrence. The recent CT scan of the chest showed no clear evidence for disease progression. I discussed the scan results with the patient and her friend. I recommended for her to continue on observation with repeat CT scan of the chest without contrast in 6 months. For COPD she will continue her current treatment by Dr. NAshok Cordiaand for the congestive heart failure she is currently followed by cardiology. She was advised to call immediately if she has any concerning symptoms in the interval. The patient voices understanding of current disease status and treatment options and is in agreement with the current care plan. All questions were answered. The patient knows to call the clinic with any problems, questions or concerns. We can certainly see the patient much sooner if necessary. I spent 10 minutes  counseling the patient face to face. The total time spent in the appointment was 15 minutes.  Disclaimer: This note was dictated with voice recognition software. Similar sounding words can inadvertently be transcribed and may not be corrected upon review.

## 2016-08-30 ENCOUNTER — Other Ambulatory Visit: Payer: Self-pay | Admitting: Internal Medicine

## 2016-08-30 NOTE — Progress Notes (Signed)
HPI: FU CAD, diastolic HF, HTN, HL. At last office visit patient complaining of dyspnea.BNP 62. Since last seen, patient contacted the office recently with increasing pedal edema. I increased her Lasix to 80 mg daily. Her edema has improved. She has significant dyspnea on exertion and a burning pain for 1-2 seconds with ambulation in her chest. She has been diagnosed with recurrent lung cancer bilaterally. She cannot receive therapy apparently because of previous lung resection.   Studies/Reports Reviewed Today:  Echo 2/13 Normal systolic function, grade 1 diastolic dysfunction.  Carotid US 7/15 No significant stenosis  Myoview 3/17 Ejection fraction 66% and no ischemia or infarction.  LHC 9/11 LM: OK LAD: irregs LCx: Mid 50% RCA: prox 50% LV - inf-apical AK  LE venous dopplers 2/18 No DVT  Current Outpatient Prescriptions  Medication Sig Dispense Refill  . albuterol (PROVENTIL HFA;VENTOLIN HFA) 108 (90 Base) MCG/ACT inhaler Inhale 2 puffs into the lungs every 4 (four) hours as needed for wheezing or shortness of breath. 1 Inhaler 3  . albuterol (PROVENTIL) (2.5 MG/3ML) 0.083% nebulizer solution Take 3 mLs (2.5 mg total) by nebulization every 4 (four) hours as needed for wheezing or shortness of breath. 120 mL 3  . aspirin 81 MG tablet Take 81 mg by mouth daily.      Marland Kitchen atorvastatin (LIPITOR) 20 MG tablet TAKE 1 TABLET(20 MG) BY MOUTH DAILY 90 tablet 1  . budesonide-formoterol (SYMBICORT) 160-4.5 MCG/ACT inhaler Inhale 2 puffs into the lungs 2 (two) times daily.    . calcium carbonate 1250 MG capsule Take 1,250 mg by mouth daily.     . cromolyn (OPTICROM) 4 % ophthalmic solution INT 1 GTT INTO OU QID  12  . fluticasone (FLONASE) 50 MCG/ACT nasal spray Place 2 sprays into both nostrils daily. 16 g 5  . furosemide (LASIX) 40 MG tablet Take 1 tablet (40 mg total) by mouth daily. 60 tablet 3  . losartan (COZAAR) 100 MG tablet Take 1 tablet (100 mg total) by mouth  daily. Yearly physical due in June must see MD for future refills 90 tablet 1  . metoprolol tartrate (LOPRESSOR) 25 MG tablet TAKE 1 TABLET BY MOUTH TWICE DAILY 60 tablet 11  . OXYGEN Inhale 2 L into the lungs. 2 liters continuous    . potassium chloride SA (K-DUR,KLOR-CON) 20 MEQ tablet TAKE 1 TABLET BY MOUTH DAILY 90 tablet 0  . Spacer/Aero-Holding Chambers (AEROCHAMBER MV) inhaler Use as instructed 1 each 0   No current facility-administered medications for this visit.      Past Medical History:  Diagnosis Date  . Asthma   . CAD (coronary artery disease)    a. LHC 9/11 (in setting of NSTEMI): LAD irregs, mLCx 50, pRCA 50, inf-apical AK;  b. Myoview 9/13: Normal stress nuclear study.  LV Ejection Fraction: 71%   . Carotid stenosis    a. Carotid US 9/13: bilat 0-39%;  b. Carotid US 7/15: no sig stenosis  . Chronic diastolic CHF (congestive heart failure) ([place])    a. Echo 7/15: Mild LVH, mild focal basal septal hypertrophy, EF 50-55%, no RWMA, Gr 1 DD  . COPD (chronic obstructive pulmonary disease) ([place])   . HTN (hypertension)   . Lung cancer [facility])    [name]  Marland Kitchen NSTEMI (non-ST elevated myocardial infarction) ([place])    with only mild to moderate CAD in September of 2011,There was concern  for apival ballooning. >> ? Tako-Tsubo syndrome vs vasospasm    Past Surgical History:  Procedure Laterality Date  . CATARACT SURGERY    . FOOT SURGERY    . GALLBLADDER SURGERY    . KNEE SURGERY    . LUNG LOBECTOMY    . VESICOVAGINAL FISTULA CLOSURE W/ TAH      Social History   Social History  . Marital status: Divorced    Spouse name: N/A  . Number of children: N/A  . Years of education: N/A   Occupational History  . Not on file.   Social History Main Topics  . Smoking status: Former Smoker    Packs/day: 1.50    Years: 42.00    Types: Cigarettes    Quit date: 04/26/2006  . Smokeless tobacco: Never  Used  . Alcohol use No  . Drug use: No  . Sexual activity: No   Other Topics Concern  . Not on file   Social History Narrative   Originally from [place]. Always lived in [place]. Prior travel to [facility]. 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 Has a dog at home. No birds, mold, or hot tub exposure. No indoor plants. No carpet in her home. Does have draperies.     Family History  Problem Relation Age of Onset  . Heart disease Mother 33  . Asthma Mother   . Diabetes Mother   . Cancer Mother     Salivary  . Emphysema Mother   . Heart disease      POSITIVE FAMILY HX. OF   . Pancreatitis      QUESTIONABLE FROM ACE INHIBITORS  . COPD Sister     ROS: no fevers or chills, productive cough, hemoptysis, dysphasia, odynophagia, melena, hematochezia, dysuria, hematuria, rash, seizure activity, orthopnea, PND, claudication. Remaining systems are negative.  Physical Exam: Well-developed obese in no acute distress.  Skin is warm and dry.  HEENT is normal.  Neck is supple.  Chest with diminished BS throughout Cardiovascular exam is regular rate and rhythm.  Abdominal exam nontender or distended. No masses palpated. Extremities show trace edema. neuro grossly intact  ECG- Sinus rhythm at a rate of 85. Occasional PAC. No ST changes. personally reviewed  A/P  1 coronary artery disease-continue aspirin and statin.  2 hyperlipidemia-continue statin.  3 hypertension-blood pressure borderline. However she follows this and it is typically controlled. Continue present medications and follow.   4 chronic diastolic congestive heart failure-patient recently contacted the office with increased edema. This is likely related to right heart failure and diastolic congestive heart failure. Her symptoms have improved with higher dose Lasix. Continue 80 mg daily. Check potassium and renal function. I have recommended that she wear compression hose and keep her feet elevated.  5  dyspnea-likely multifactorial including history of lung resection, COPD, recurrent lung cancer, obesity hypoventilation syndrome and diastolic heart failure. She is on home oxygen and followed by pulmonary.  Kirk Ruths, MD

## 2016-09-01 ENCOUNTER — Ambulatory Visit (INDEPENDENT_AMBULATORY_CARE_PROVIDER_SITE_OTHER): Payer: Medicare Other | Admitting: Internal Medicine

## 2016-09-01 ENCOUNTER — Ambulatory Visit (INDEPENDENT_AMBULATORY_CARE_PROVIDER_SITE_OTHER): Payer: Medicare Other | Admitting: Cardiology

## 2016-09-01 ENCOUNTER — Encounter: Payer: Self-pay | Admitting: Internal Medicine

## 2016-09-01 ENCOUNTER — Encounter: Payer: Self-pay | Admitting: Cardiology

## 2016-09-01 ENCOUNTER — Ambulatory Visit (INDEPENDENT_AMBULATORY_CARE_PROVIDER_SITE_OTHER)
Admission: RE | Admit: 2016-09-01 | Discharge: 2016-09-01 | Disposition: A | Discharge status: Home / Self Care | Payer: Medicare Other | Source: Ambulatory Visit | Attending: Internal Medicine | Admitting: Internal Medicine

## 2016-09-01 VITALS — BP 144/78 | HR 85 | Ht 61.0 in | Wt 221.0 lb

## 2016-09-01 VITALS — BP 132/70 | HR 80 | Temp 98.3°F | Resp 16 | Ht 61.0 in | Wt 225.2 lb

## 2016-09-01 DIAGNOSIS — R10811 Right upper quadrant abdominal tenderness: Secondary | ICD-10-CM

## 2016-09-01 DIAGNOSIS — R0600 Dyspnea, unspecified: Secondary | ICD-10-CM

## 2016-09-01 DIAGNOSIS — I2510 Atherosclerotic heart disease of native coronary artery without angina pectoris: Secondary | ICD-10-CM

## 2016-09-01 DIAGNOSIS — I10 Essential (primary) hypertension: Secondary | ICD-10-CM | POA: Diagnosis not present

## 2016-09-01 DIAGNOSIS — E7800 Pure hypercholesterolemia, unspecified: Secondary | ICD-10-CM | POA: Diagnosis not present

## 2016-09-01 DIAGNOSIS — R1011 Right upper quadrant pain: Secondary | ICD-10-CM | POA: Diagnosis not present

## 2016-09-01 NOTE — Progress Notes (Signed)
Subjective:  Patient ID: Brandy Cameron, female    DOB: [birth year]  Age: 81 y.o. MRN: [id number]  CC: Abdominal Pain   HPI Brandy Cameron presents for a 3-4 day hx of pain ("soreness") in her epigastric and RUQ area. She is status post cholecystectomy. She has a remote history of pancreatitis. She suffers from chronic constipation but denies diarrhea, nausea, vomiting, loss of appetite, or weight loss.  Outpatient Medications Prior to Visit  Medication Sig Dispense Refill  . albuterol (PROVENTIL HFA;VENTOLIN HFA) 108 (90 Base) MCG/ACT inhaler Inhale 2 puffs into the lungs every 4 (four) hours as needed for wheezing or shortness of breath. 1 Inhaler 3  . albuterol (PROVENTIL) (2.5 MG/3ML) 0.083% nebulizer solution Take 3 mLs (2.5 mg total) by nebulization every 4 (four) hours as needed for wheezing or shortness of breath. 120 mL 3  . aspirin 81 MG tablet Take 81 mg by mouth daily.      Marland Kitchen atorvastatin (LIPITOR) 20 MG tablet TAKE 1 TABLET(20 MG) BY MOUTH DAILY 90 tablet 1  . budesonide-formoterol (SYMBICORT) 160-4.5 MCG/ACT inhaler Inhale 2 puffs into the lungs 2 (two) times daily.    . calcium carbonate 1250 MG capsule Take 1,250 mg by mouth daily.     . cromolyn (OPTICROM) 4 % ophthalmic solution INT 1 GTT INTO OU QID  12  . fluticasone (FLONASE) 50 MCG/ACT nasal spray Place 2 sprays into both nostrils daily. 16 g 5  . furosemide (LASIX) 40 MG tablet Take 1 tablet (40 mg total) by mouth daily. 60 tablet 3  . losartan (COZAAR) 100 MG tablet Take 1 tablet (100 mg total) by mouth daily. Yearly physical due in June must see MD for future refills 90 tablet 1  . metoprolol tartrate (LOPRESSOR) 25 MG tablet TAKE 1 TABLET BY MOUTH TWICE DAILY 60 tablet 11  . OXYGEN Inhale 2 L into the lungs. 2 liters continuous    . potassium chloride SA (K-DUR,KLOR-CON) 20 MEQ tablet TAKE 1 TABLET BY MOUTH DAILY 90 tablet 0  . Spacer/Aero-Holding Chambers (AEROCHAMBER MV) inhaler Use as instructed 1 each 0  .  Coenzyme Q10 (CO Q-10) 100 MG CAPS Take 1 tablet by mouth daily.    Marland Kitchen dextromethorphan-guaiFENesin (MUCINEX DM) 30-600 MG 12hr tablet Take 1 tablet by mouth 2 (two) times daily.    . fluconazole (DIFLUCAN) 200 MG tablet     . ipratropium (ATROVENT) 0.02 % nebulizer solution Take 2.5 mLs (0.5 mg total) by nebulization 3 (three) times daily. DX code J44.9 225 mL 6  . metroNIDAZOLE (METROGEL) 0.75 % vaginal gel Place 1 Applicatorful vaginally 2 (two) times daily. 70 g 0  . saccharomyces boulardii (FLORASTOR) 250 MG capsule Take 1 capsule (250 mg total) by mouth 2 (two) times daily. 20 capsule 0  . nitroGLYCERIN (NITROSTAT) 0.4 MG SL tablet Place 1 tablet (0.4 mg total) under the tongue every 5 (five) minutes as needed for chest pain. 25 tablet 12   No facility-administered medications prior to visit.     ROS Review of Systems  Constitutional: Negative.  Negative for appetite change, chills, diaphoresis, fatigue and fever.  HENT: Negative.  Negative for trouble swallowing.   Eyes: Negative for visual disturbance.  Respiratory: Negative for cough, chest tightness, shortness of breath, wheezing and stridor.   Cardiovascular: Negative for chest pain, palpitations and leg swelling.  Gastrointestinal: Positive for abdominal pain and constipation. Negative for abdominal distention, anal bleeding, blood in stool, diarrhea, nausea and vomiting.  Endocrine: Negative.  Genitourinary: Negative.  Negative for decreased urine volume, difficulty urinating, dysuria, flank pain, frequency and urgency.  Musculoskeletal: Positive for back pain. Negative for arthralgias, joint swelling, myalgias and neck pain.  Neurological: Negative.  Negative for dizziness, weakness, numbness and headaches.  Hematological: Negative for adenopathy. Does not bruise/bleed easily.  Psychiatric/Behavioral: Negative.     Objective:  BP 132/70 (BP Location: Left Arm, Patient Position: Sitting, Cuff Size: Large)   Pulse 80   Temp  98.3 F (36.8 C) (Oral)   Resp 16   Ht '5\' 1"'$  (1.549 m)   Wt 225 lb 4 oz (102.2 kg)   SpO2 97%   BMI 42.56 kg/m   BP Readings from Last 3 Encounters:  09/01/16 (!) 144/78  09/01/16 132/70  08/17/16 (!) 150/48    Wt Readings from Last 3 Encounters:  09/01/16 221 lb (100.2 kg)  09/01/16 225 lb 4 oz (102.2 kg)  08/17/16 226 lb 6.4 oz (102.7 kg)    Physical Exam  Constitutional: She is oriented to person, place, and time.  Non-toxic appearance. She does not have a sickly appearance. She does not appear ill. No distress.  HENT:  Mouth/Throat: Oropharynx is clear and moist. No oropharyngeal exudate.  Eyes: Conjunctivae are normal. Right eye exhibits no discharge. Left eye exhibits no discharge. No scleral icterus.  Neck: Normal range of motion. Neck supple. No JVD present. No tracheal deviation present. No thyromegaly present.  Cardiovascular: Normal rate, regular rhythm, normal heart sounds and intact distal pulses.  Exam reveals no gallop and no friction rub.   No murmur heard. Pulmonary/Chest: Effort normal and breath sounds normal. No stridor. No respiratory distress. She has no wheezes. She has no rales. She exhibits no tenderness.  Abdominal: Soft. She exhibits no distension and no mass. There is no hepatosplenomegaly, splenomegaly or hepatomegaly. There is tenderness in the right upper quadrant and epigastric area. There is no rigidity, no rebound, no guarding, no CVA tenderness, no tenderness at McBurney's point and negative Murphy's sign. No hernia.  Musculoskeletal: Normal range of motion. She exhibits no edema, tenderness or deformity.  Lymphadenopathy:    She has no cervical adenopathy.  Neurological: She is oriented to person, place, and time.  Skin: Skin is warm. No rash noted. She is not diaphoretic. No erythema. No pallor.  Vitals reviewed.   Lab Results  Component Value Date   WBC 7.7 08/13/2016   HGB 12.9 08/13/2016   HCT 42.6 08/13/2016   PLT 266 08/13/2016    GLUCOSE 135 (H) 09/01/2016   CHOL 135 10/07/2015   TRIG 189.0 (H) 10/07/2015   HDL 43.80 10/07/2015   LDLCALC 53 10/07/2015   ALT 22 08/13/2016   AST 15 08/13/2016   NA 146 09/01/2016   K 4.1 09/01/2016   CL 100 09/01/2016   CREATININE 0.82 09/01/2016   BUN 17 09/01/2016   CO2 36 (H) 09/01/2016   TSH 1.90 10/07/2015   INR 1.00 11/22/2014   HGBA1C 5.8 10/07/2015    Ct Chest W Contrast  Result Date: 08/13/2016 CLINICAL DATA:  Status post left upper lobectomy for lung cancer. EXAM: CT CHEST WITH CONTRAST TECHNIQUE: Multidetector CT imaging of the chest was performed during intravenous contrast administration. CONTRAST:  90m ISOVUE-300 IOPAMIDOL (ISOVUE-300) INJECTION 61% COMPARISON:  03/30/2016 FINDINGS: Cardiovascular: The heart size is normal. No pericardial effusion. Coronary artery calcification is noted. No thoracic aortic aneurysm. Mediastinum/Nodes: No mediastinal lymphadenopathy. There is no hilar lymphadenopathy. The esophagus has normal imaging features. There is no axillary lymphadenopathy. Lungs/Pleura:  16 x 10 mm right apical nodule is stable. Volume loss again noted left hemithorax. Post treatment changes noted anterior and medial aspect of the remaining left lung. Irregular medial left lung opacity measured previously towards the apex at 4.9 x 1.7 cm now measures 4.8 x 1.6 cm peripheral left lung nodule measuring about 2 mm on image 50 is stable. 7 mm left lower lobe nodule (image 89 series 5) is unchanged. No pulmonary edema or pleural effusion. Upper Abdomen: Stable cyst upper pole left kidney. Tiny hiatal hernia. Musculoskeletal: Bone windows reveal no worrisome lytic or sclerotic osseous lesions. IMPRESSION: 1. Stable exam compared to 03/30/2016. The irregular opacity in the left suprahilar region is unchanged, but was hypermetabolic on previous PET imaging and continued close attention recommended. 2. Stable right apical pulmonary nodule. Continued close follow-up recommended as  this also showed low level FDG accumulation. Electronically Signed   By: Misty Stanley M.D.   On: 08/13/2016 15:59   Ct Chest W Contrast  Result Date: 08/13/2016 CLINICAL DATA:  Status post left upper lobectomy for lung cancer. EXAM: CT CHEST WITH CONTRAST TECHNIQUE: Multidetector CT imaging of the chest was performed during intravenous contrast administration. CONTRAST:  23m ISOVUE-300 IOPAMIDOL (ISOVUE-300) INJECTION 61% COMPARISON:  03/30/2016 FINDINGS: Cardiovascular: The heart size is normal. No pericardial effusion. Coronary artery calcification is noted. No thoracic aortic aneurysm. Mediastinum/Nodes: No mediastinal lymphadenopathy. There is no hilar lymphadenopathy. The esophagus has normal imaging features. There is no axillary lymphadenopathy. Lungs/Pleura: 16 x 10 mm right apical nodule is stable. Volume loss again noted left hemithorax. Post treatment changes noted anterior and medial aspect of the remaining left lung. Irregular medial left lung opacity measured previously towards the apex at 4.9 x 1.7 cm now measures 4.8 x 1.6 cm peripheral left lung nodule measuring about 2 mm on image 50 is stable. 7 mm left lower lobe nodule (image 89 series 5) is unchanged. No pulmonary edema or pleural effusion. Upper Abdomen: Stable cyst upper pole left kidney. Tiny hiatal hernia. Musculoskeletal: Bone windows reveal no worrisome lytic or sclerotic osseous lesions. IMPRESSION: 1. Stable exam compared to 03/30/2016. The irregular opacity in the left suprahilar region is unchanged, but was hypermetabolic on previous PET imaging and continued close attention recommended. 2. Stable right apical pulmonary nodule. Continued close follow-up recommended as this also showed low level FDG accumulation. Electronically Signed   By: EMisty StanleyM.D.   On: 08/13/2016 15:59   Dg Abd Acute W/chest  Result Date: 09/01/2016 CLINICAL DATA:  Shortness of breath and right upper quadrant pain for several days EXAM: DG ABDOMEN  ACUTE W/ 1V CHEST COMPARISON:  08/13/2016 FINDINGS: Cardiac shadow is within normal limits. Postsurgical changes are noted on the left with mild volume loss. The overall appearance is similar to that seen on recent CT. A stable right apical nodule is noted. The nodule in the right lower lobe is not as well appreciated on this exam. No focal infiltrate is seen. Scattered large and small bowel gas is noted. A mild amount of fecal material is noted within the colon consistent with mild constipation. Postsurgical changes are noted. Degenerative change of the lumbar spine is seen. No free air is noted. IMPRESSION: Chronic changes without acute abnormality. Electronically Signed   By: MInez CatalinaM.D.   On: 09/01/2016 15:58    Assessment & Plan:   [place] seen today for abdominal pain.  Diagnoses and all orders for this visit:  Coronary artery disease involving native coronary artery of  native heart without angina pectoris- she's had no recent episodes of chest pain, will check her lipid panel to be certain that she has achieved her LDL goal. -     Lipid panel; Future  Right upper quadrant abdominal tenderness without rebound tenderness- her abdominal exam is not consistent with an acute process, I will check pancreatic enzymes to screen for pancreatitis, on her CBC see if there is no concern for anemia suggesting upper GI pathology, will check her LFTs and renal function as well. Plain films of her abdomen are unremarkable. In the meantime she will let me know if she develops any new or worsening symptoms. -     Lipase; Future -     Comprehensive metabolic panel; Future -     CBC with Differential/Platelet; Future -     Amylase; Future -     Urinalysis, Routine w reflex microscopic; Future -     DG Abd Acute W/Chest; Future   I have discontinued Ms. Stracke's nitroGLYCERIN, Co Q-10, dextromethorphan-guaiFENesin, ipratropium, metroNIDAZOLE, saccharomyces boulardii, and fluconazole. I am also  having her maintain her aspirin, budesonide-formoterol, AEROCHAMBER MV, OXYGEN, cromolyn, calcium carbonate, metoprolol tartrate, fluticasone, losartan, atorvastatin, albuterol, albuterol, furosemide, and potassium chloride SA.  No orders of the defined types were placed in this encounter.    Follow-up: Return in about 1 day (around 09/02/2016).  [name], MD

## 2016-09-01 NOTE — Patient Instructions (Signed)

## 2016-09-01 NOTE — Patient Instructions (Signed)
Medication Instructions:   NO CHANGE  Labwork:  Your physician recommends that you HAVE LAB WORK TODAY  Follow-Up:  Your physician recommends that you schedule a follow-up appointment in: [place]   If you need a refill on your cardiac medications before your next appointment, please call your pharmacy.

## 2016-09-02 LAB — BASIC METABOLIC PANEL
BUN: 17 mg/dL (ref 7–25)
CALCIUM: 9.2 mg/dL (ref 8.6–10.4)
CHLORIDE: 100 mmol/L (ref 98–110)
CO2: 36 mmol/L — AB (ref 20–31)
Creat: 0.82 mg/dL (ref 0.60–0.93)
GLUCOSE: 135 mg/dL — AB (ref 65–99)
POTASSIUM: 4.1 mmol/L (ref 3.5–5.3)
SODIUM: 146 mmol/L (ref 135–146)

## 2016-09-09 ENCOUNTER — Encounter: Payer: Self-pay | Admitting: Emergency Medicine

## 2016-09-09 ENCOUNTER — Telehealth: Payer: Self-pay | Admitting: Cardiology

## 2016-09-09 ENCOUNTER — Emergency Department
Admission: EM | Admit: 2016-09-09 | Discharge: 2016-09-09 | Disposition: A | Discharge status: Home / Self Care | Payer: Medicare Other | Source: Home / Self Care | Attending: Family Medicine | Admitting: Family Medicine

## 2016-09-09 DIAGNOSIS — L03114 Cellulitis of left upper limb: Secondary | ICD-10-CM

## 2016-09-09 MED ORDER — CEPHALEXIN 500 MG PO CAPS: 500.0000 mg | ORAL_CAPSULE | Freq: Three times a day (TID) | ORAL | 0 refills | Status: AC

## 2016-09-09 NOTE — ED Triage Notes (Signed)
Patient has sore scratched area on top/back of left hand where her dog accidentally scratched her on 09/03/16.

## 2016-09-09 NOTE — Telephone Encounter (Signed)
Please call,concerning her Furosemide.

## 2016-09-09 NOTE — ED Provider Notes (Signed)
[facility]    CSN: [id number] Arrival date & time: 09/09/16  1127     History   Chief Complaint Chief Complaint  Patient presents with  . Wound Infection    HPI Brandy Cameron is a 81 y.o. female.   Patient's dog scratched the dorsum of her left hand 11 days ago resulting in a laceration.  The wound has closed, but Brandy Cameron has developed gradual swelling and pain in the dorsum of her left hand surrounding the laceration.  There has been no recent drainage from the wound.  Her Tdap is current.   The history is provided by the patient and a friend.    Past Medical History:  Diagnosis Date  . Asthma   . CAD (coronary artery disease)    a. LHC 9/11 (in setting of NSTEMI): LAD irregs, mLCx 50, pRCA 50, inf-apical AK;  b. Myoview 9/13: Normal stress nuclear study.  LV Ejection Fraction: 71%   . Carotid stenosis    a. Carotid US 9/13: bilat 0-39%;  b. Carotid US 7/15: no sig stenosis  . Chronic diastolic CHF (congestive heart failure) ([place])    a. Echo 7/15: Mild LVH, mild focal basal septal hypertrophy, EF 50-55%, no RWMA, Gr 1 DD  . COPD (chronic obstructive pulmonary disease) ([place])   . HTN (hypertension)   . Lung cancer [facility])    Brandy Cameron has had a LUL VATS for lung cancer and radioactive seed implantation on October of 2010.  Marland Kitchen NSTEMI (non-ST elevated myocardial infarction) ([place])    with only mild to moderate CAD in September of 2011,There was concern for apival ballooning. >> ? Tako-Tsubo syndrome vs vasospasm    Patient Active Problem List   Diagnosis Date Noted  . Right upper quadrant abdominal tenderness without rebound tenderness 09/01/2016  . Chronic respiratory failure ([place]) 10/14/2015  . Hyperglycemia 10/07/2015  . Menopause ovarian failure 10/07/2015  . Encounter for colorectal cancer screening 10/07/2015  . COPD (chronic obstructive pulmonary disease) ([place]) 08/29/2015  . GERD (gastroesophageal reflux disease) 08/29/2015  . Acute diastolic congestive heart  failure ([place]) 10/29/2013  . Cerebrovascular disease 08/08/2013  . ASTHMA 12/26/2010  . CAD (coronary artery disease) 11/11/2010  . Hypertension 11/11/2010  . Hyperlipidemia with target LDL less than 70 11/11/2010  . Primary cancer of left upper lobe of lung ([place]) 11/11/2010    Past Surgical History:  Procedure Laterality Date  . CATARACT SURGERY    . FOOT SURGERY    . GALLBLADDER SURGERY    . KNEE SURGERY    . LUNG LOBECTOMY    . VESICOVAGINAL FISTULA CLOSURE W/ TAH      OB History    No data available       Home Medications    Prior to Admission medications   Medication Sig Start Date End Date Taking? Authorizing Provider  albuterol (PROVENTIL HFA;VENTOLIN HFA) 108 (90 Base) MCG/ACT inhaler Inhale 2 puffs into the lungs every 4 (four) hours as needed for wheezing or shortness of breath. 07/08/16   [name], MD  albuterol (PROVENTIL) (2.5 MG/3ML) 0.083% nebulizer solution Take 3 mLs (2.5 mg total) by nebulization every 4 (four) hours as needed for wheezing or shortness of breath. 07/08/16   [name], MD  aspirin 81 MG tablet Take 81 mg by mouth daily.      [provider]  atorvastatin (LIPITOR) 20 MG tablet TAKE 1 TABLET(20 MG) BY MOUTH DAILY 06/08/16   [name], MD  budesonide-formoterol [facility])  160-4.5 MCG/ACT inhaler Inhale 2 puffs into the lungs 2 (two) times daily.    [provider]  calcium carbonate 1250 MG capsule Take 1,250 mg by mouth daily.     [provider]  cephALEXin (KEFLEX) 500 MG capsule Take 1 capsule (500 mg total) by mouth 3 (three) times daily. 09/09/16 09/19/16  Kandra Nicolas, MD  cromolyn (OPTICROM) 4 % ophthalmic solution INT 1 GTT INTO OU QID 09/04/15   [provider]  fluticasone (FLONASE) 50 MCG/ACT nasal spray Place 2 sprays into both nostrils daily. 03/10/16   [name], MD  furosemide (LASIX) 40 MG tablet Take 1 tablet (40 mg total) by mouth daily. 08/17/16   Lelon Perla, MD  losartan (COZAAR) 100 MG tablet Take 1 tablet (100 mg total) by mouth daily. Yearly physical due in June must see MD for future refills 05/11/16   [name], MD  metoprolol tartrate (LOPRESSOR) 25 MG tablet TAKE 1 TABLET BY MOUTH TWICE DAILY 01/05/16   [name], MD  OXYGEN Inhale 2 L into the lungs. 2 liters continuous    [provider]  potassium chloride SA (K-DUR,KLOR-CON) 20 MEQ tablet TAKE 1 TABLET BY MOUTH DAILY 08/30/16   [name], MD  Spacer/Aero-Holding Chambers (AEROCHAMBER MV) inhaler Use as instructed 08/29/15   [name], MD    Family History Family History  Problem Relation Age of Onset  . Heart disease Mother 57  . Asthma Mother   . Diabetes Mother   . Cancer Mother        Salivary  . Emphysema Mother   . Heart disease Unknown        POSITIVE FAMILY HX. OF   . Pancreatitis Unknown        QUESTIONABLE FROM ACE INHIBITORS  . COPD Sister     Social History Social History  Substance Use Topics  . Smoking status: Former Smoker    Packs/day: 1.50    Years: 42.00    Types: Cigarettes    Quit date: 04/26/2006  . Smokeless tobacco: Never Used  . Alcohol use No     Allergies   Augmentin [amoxicillin-pot clavulanate]; Sulfa antibiotics; Amlodipine; Pantoprazole; and Sulfonamide derivatives   Review of Systems Review of Systems  Constitutional: Negative for chills, diaphoresis and fever.  All other systems reviewed and are negative.    Physical Exam Triage Vital Signs ED Triage Vitals  Enc Vitals Group     BP 09/09/16 1155 (!) 163/75     Pulse Rate 09/09/16 1155 97     Resp 09/09/16 1155 20     Temp 09/09/16 1155 98.8 F (37.1 C)     Temp Source 09/09/16 1155 Oral     SpO2 09/09/16 1155 95 %     Weight --      Height --      Head Circumference --      Peak Flow --      Pain Score 09/09/16 1157 2     Pain Loc --      Pain Edu? --      Excl. in [place]? --    No data found.   Updated Vital Signs BP (!)  163/75 (BP Location: Right Arm)   Pulse 97   Temp 98.8 F (37.1 C) (Oral)   Resp 20   SpO2 95%   Visual Acuity Right Eye Distance:   Left Eye Distance:   Bilateral Distance:    Right Eye  Near:   Left Eye Near:    Bilateral Near:     Physical Exam  Constitutional: Brandy Cameron appears well-developed and well-nourished. No distress.  Eyes: Pupils are equal, round, and reactive to light.  Cardiovascular: Normal rate.   Pulmonary/Chest: Effort normal.  Musculoskeletal:       Hands: Left hand has a 2cm long eschar on dorsum as noted on diagram.  There is mild erythema immediately surrounding the wound, and tenderness/edema of the dorsum.  Al fingers have full range of motion.  Distal neurovascular function is intact.   Neurological: Brandy Cameron is alert.  Skin: Skin is dry.  Nursing note and vitals reviewed.    UC Treatments / Results  Labs (all labs ordered are listed, but only abnormal results are displayed) Labs Reviewed - No data to display  EKG  EKG Interpretation None       Radiology No results found.  Procedures Procedures (including critical care time)  Medications Ordered in UC Medications - No data to display   Initial Impression / Assessment and Plan / UC Course  I have reviewed the triage vital signs and the nursing notes.  Pertinent labs & imaging results that were available during my care of the patient were reviewed by me and considered in my medical decision making (see chart for details).    Begin Keflex '500mg'$  TID. Elevate hand as much as possible.  Apply heating pad several times daily.  If symptoms become significantly worse during the night or over the weekend, proceed to the local emergency room.  Call if not improving in 48 hours (would switch to clindamycin)    Final Clinical Impressions(s) / UC Diagnoses   Final diagnoses:  Cellulitis of left hand    New Prescriptions New Prescriptions   CEPHALEXIN (KEFLEX) 500 MG CAPSULE    Take 1 capsule (500  mg total) by mouth 3 (three) times daily.     Kandra Nicolas, MD 09/09/16 1255

## 2016-09-09 NOTE — Discharge Instructions (Signed)
Elevate hand as much as possible.  Apply heating pad several times daily.  If symptoms become significantly worse during the night or over the weekend, proceed to the local emergency room.

## 2016-09-10 ENCOUNTER — Other Ambulatory Visit: Payer: Self-pay | Admitting: *Deleted

## 2016-09-10 DIAGNOSIS — Z85118 Personal history of other malignant neoplasm of bronchus and lung: Secondary | ICD-10-CM | POA: Diagnosis not present

## 2016-09-10 DIAGNOSIS — J449 Chronic obstructive pulmonary disease, unspecified: Secondary | ICD-10-CM | POA: Diagnosis not present

## 2016-09-10 DIAGNOSIS — R0602 Shortness of breath: Secondary | ICD-10-CM | POA: Diagnosis not present

## 2016-09-10 MED ORDER — FUROSEMIDE 40 MG PO TABS: 80.0000 mg | ORAL_TABLET | Freq: Every day | ORAL | 3 refills | Status: DC

## 2016-09-10 MED ORDER — FUROSEMIDE 40 MG PO TABS: 80.0000 mg | ORAL_TABLET | Freq: Two times a day (BID) | ORAL | 3 refills | Status: DC

## 2016-09-10 NOTE — Telephone Encounter (Signed)
Spoke with pt, new script for the furosemide sent to the pharmacy.

## 2016-09-13 DIAGNOSIS — Z85118 Personal history of other malignant neoplasm of bronchus and lung: Secondary | ICD-10-CM | POA: Diagnosis not present

## 2016-09-13 DIAGNOSIS — J449 Chronic obstructive pulmonary disease, unspecified: Secondary | ICD-10-CM | POA: Diagnosis not present

## 2016-09-13 DIAGNOSIS — R0602 Shortness of breath: Secondary | ICD-10-CM | POA: Diagnosis not present

## 2016-10-11 DIAGNOSIS — R0602 Shortness of breath: Secondary | ICD-10-CM | POA: Diagnosis not present

## 2016-10-11 DIAGNOSIS — J449 Chronic obstructive pulmonary disease, unspecified: Secondary | ICD-10-CM | POA: Diagnosis not present

## 2016-10-11 DIAGNOSIS — Z85118 Personal history of other malignant neoplasm of bronchus and lung: Secondary | ICD-10-CM | POA: Diagnosis not present

## 2016-10-14 DIAGNOSIS — R0602 Shortness of breath: Secondary | ICD-10-CM | POA: Diagnosis not present

## 2016-10-14 DIAGNOSIS — J449 Chronic obstructive pulmonary disease, unspecified: Secondary | ICD-10-CM | POA: Diagnosis not present

## 2016-10-14 DIAGNOSIS — Z85118 Personal history of other malignant neoplasm of bronchus and lung: Secondary | ICD-10-CM | POA: Diagnosis not present

## 2016-10-25 ENCOUNTER — Telehealth: Payer: Self-pay | Admitting: Cardiology

## 2016-10-25 MED ORDER — NITROGLYCERIN 0.4 MG SL SUBL: 0.4000 mg | SUBLINGUAL_TABLET | SUBLINGUAL | 12 refills | Status: DC | PRN

## 2016-10-25 NOTE — Telephone Encounter (Signed)
°*  STAT* If patient is at the pharmacy, call can be transferred to refill team.   1. Which medications need to be refilled? (please list name of each medication and dose if known)Pt needs a new prescription for her Nirtoglycerin  2. Which pharmacy/location (including street and city if local pharmacy) is medication to be sent to?[facility]-[phone]  3. Do they need a 30 day or 90 day supply? 1 bottle

## 2016-10-25 NOTE — Telephone Encounter (Signed)
Refill sent to the pharmacy electronically.  

## 2016-10-29 ENCOUNTER — Telehealth: Payer: Self-pay | Admitting: Pulmonary Disease

## 2016-10-29 MED ORDER — BUDESONIDE-FORMOTEROL FUMARATE 160-4.5 MCG/ACT IN AERO: 2.0000 | INHALATION_SPRAY | Freq: Two times a day (BID) | RESPIRATORY_TRACT | 6 refills | Status: DC

## 2016-10-29 NOTE — Telephone Encounter (Signed)
Called and lmom to make the pt aware that the refill for the symbicort has been sent to the pharmacy.

## 2016-11-01 ENCOUNTER — Telehealth: Payer: Self-pay | Admitting: Pulmonary Disease

## 2016-11-01 DIAGNOSIS — R102 Pelvic and perineal pain: Secondary | ICD-10-CM | POA: Diagnosis not present

## 2016-11-01 DIAGNOSIS — R3121 Asymptomatic microscopic hematuria: Secondary | ICD-10-CM | POA: Diagnosis not present

## 2016-11-01 NOTE — Telephone Encounter (Signed)
A prescription refill request was faxed to [facility] at [phone] for Symbicort 160 with 6 refills. The directions states to inhale 2 puffs into the lungs twice daily. A confirmation fax sheet was received. Nothing further is needed.

## 2016-11-04 ENCOUNTER — Telehealth: Payer: Self-pay | Admitting: Pulmonary Disease

## 2016-11-04 NOTE — Telephone Encounter (Signed)
A certificate of medical necessity for oxygen was completed by JN and placed in attached envelope addressed to [place]. [place] [address] [place] [address]. Envelope was placed in outgoing mailbox. Nothing further is needed.

## 2016-11-08 ENCOUNTER — Other Ambulatory Visit: Payer: Self-pay | Admitting: Pulmonary Disease

## 2016-11-08 ENCOUNTER — Other Ambulatory Visit: Payer: Self-pay | Admitting: Internal Medicine

## 2016-11-08 DIAGNOSIS — I10 Essential (primary) hypertension: Secondary | ICD-10-CM

## 2016-11-09 ENCOUNTER — Other Ambulatory Visit: Payer: Self-pay | Admitting: Internal Medicine

## 2016-11-10 DIAGNOSIS — J449 Chronic obstructive pulmonary disease, unspecified: Secondary | ICD-10-CM | POA: Diagnosis not present

## 2016-11-10 DIAGNOSIS — R0602 Shortness of breath: Secondary | ICD-10-CM | POA: Diagnosis not present

## 2016-11-10 DIAGNOSIS — Z85118 Personal history of other malignant neoplasm of bronchus and lung: Secondary | ICD-10-CM | POA: Diagnosis not present

## 2016-11-11 NOTE — Progress Notes (Signed)
HPI: FU CAD, diastolic HF, HTN, HL. Since last seen, patient has significant dyspnea on exertion which is unchanged. No orthopnea or PND. Her pedal edema has improved. There is no chest pain, palpitations or syncope.  Venous Dopplers February 2018 No DVT  Echo 6/96 Normal systolic function, grade 1 diastolic dysfunction.  Carotid US 7/15 No significant stenosis  Myoview 3/17 Ejection fraction 66% and no ischemia or infarction.  LHC 9/11 LM: OK LAD: irregs LCx: Mid 50% RCA: prox 50% LV - inf-apical AK  Current Outpatient Prescriptions  Medication Sig Dispense Refill  . albuterol (PROVENTIL HFA;VENTOLIN HFA) 108 (90 Base) MCG/ACT inhaler Inhale 2 puffs into the lungs every 4 (four) hours as needed for wheezing or shortness of breath. 1 Inhaler 3  . albuterol (PROVENTIL) (2.5 MG/3ML) 0.083% nebulizer solution Take 3 mLs (2.5 mg total) by nebulization every 4 (four) hours as needed for wheezing or shortness of breath. 120 mL 3  . aspirin 81 MG tablet Take 81 mg by mouth daily.      Marland Kitchen atorvastatin (LIPITOR) 20 MG tablet TAKE 1 TABLET(20 MG) BY MOUTH DAILY 90 tablet 1  . budesonide-formoterol (SYMBICORT) 160-4.5 MCG/ACT inhaler Inhale 2 puffs into the lungs 2 (two) times daily. 1 Inhaler 6  . calcium carbonate 1250 MG capsule Take 1,250 mg by mouth daily.     . Cholecalciferol (VITAMIN D3 PO) Take by mouth daily.    . furosemide (LASIX) 40 MG tablet Take 2 tablets (80 mg total) by mouth daily. 180 tablet 3  . losartan (COZAAR) 100 MG tablet TAKE 1 TABLET BY MOUTH DAILY(YEARLY PHYSICAL IS DUE IN JUNE MUST SEE MD FOR REFILLS) 90 tablet 1  . metoprolol tartrate (LOPRESSOR) 25 MG tablet TAKE 1 TABLET BY MOUTH TWICE DAILY 180 tablet 1  . nitroGLYCERIN (NITROSTAT) 0.4 MG SL tablet Place 1 tablet (0.4 mg total) under the tongue every 5 (five) minutes as needed for chest pain. 25 tablet 12  . Omega-3 Fatty Acids (FISH OIL PO) Take by mouth daily.    . OXYGEN Inhale 2 L into the  lungs. 2 liters continuous    . potassium chloride SA (K-DUR,KLOR-CON) 20 MEQ tablet TAKE 1 TABLET BY MOUTH DAILY 90 tablet 0  . Spacer/Aero-Holding Chambers (AEROCHAMBER MV) inhaler Use as instructed 1 each 0   No current facility-administered medications for this visit.      Past Medical History:  Diagnosis Date  . Asthma   . CAD (coronary artery disease)    a. LHC 9/11 (in setting of NSTEMI): LAD irregs, mLCx 50, pRCA 50, inf-apical AK;  b. Myoview 9/13: Normal stress nuclear study.  LV Ejection Fraction: 71%   . Carotid stenosis    a. Carotid US 9/13: bilat 0-39%;  b. Carotid US 7/15: no sig stenosis  . Chronic diastolic CHF (congestive heart failure) ([place])    a. Echo 7/15: Mild LVH, mild focal basal septal hypertrophy, EF 50-55%, no RWMA, Gr 1 DD  . COPD (chronic obstructive pulmonary disease) ([place])   . HTN (hypertension)   . Lung cancer [facility])    [name]  Marland Kitchen NSTEMI (non-ST elevated myocardial infarction) ([place])    with only mild to moderate CAD in September of 2011,There was concern for apival ballooning. >> ? Tako-Tsubo syndrome vs vasospasm    Past Surgical History:  Procedure Laterality Date  . CATARACT SURGERY    . FOOT  SURGERY    . GALLBLADDER SURGERY    . KNEE SURGERY    . LUNG LOBECTOMY    . VESICOVAGINAL FISTULA CLOSURE W/ TAH      Social History   Social History  . Marital status: Divorced    Spouse name: N/A  . Number of children: N/A  . Years of education: N/A   Occupational History  . Not on file.   Social History Main Topics  . Smoking status: Former Smoker    Packs/day: 1.50    Years: 42.00    Types: Cigarettes    Quit date: 04/26/2006  . Smokeless tobacco: Never Used  . Alcohol use No  . Drug use: No  . Sexual activity: No   Other Topics Concern  . Not on file   Social History Narrative   Originally from [place]. Always lived in [place]. Prior travel to [facility] 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 Has a dog at home. No birds, mold, or hot tub exposure. No indoor plants. No carpet in her home. Does have draperies.     Family History  Problem Relation Age of Onset  . Heart disease Mother 58  . Asthma Mother   . Diabetes Mother   . Cancer Mother        Salivary  . Emphysema Mother   . Heart disease Unknown        POSITIVE FAMILY HX. OF   . Pancreatitis Unknown        QUESTIONABLE FROM ACE INHIBITORS  . COPD Sister     ROS: no fevers or chills, productive cough, hemoptysis, dysphasia, odynophagia, melena, hematochezia, dysuria, hematuria, rash, seizure activity, orthopnea, PND, pedal edema, claudication. Remaining systems are negative.  Physical Exam: Well-developed obese in no acute distress.  Skin is warm and dry.  HEENT is normal.  Neck is supple.  Chest diminished BS throughout Cardiovascular exam is regular rate and rhythm.  Abdominal exam nontender or distended. No masses palpated. Extremities show no edema. neuro grossly intact   A/P  1 Coronary artery disease-continue aspirin and statin.  2 hyperlipidemia-continue statin.  3 hypertension-blood pressure is controlled. Continue present medications.  4 chronic diastolic congestive heart failure-likely combination of diastolic congestive heart failure and right heart failure. Continue present dose of Lasix. Check potassium and renal function.  5 dyspnea-likely multifactorial including diastolic congestive heart failure and OHS but predominantly related to COPD and recurrent lung cancer. Also with history of lung resection.   Kirk Ruths, MD

## 2016-11-12 ENCOUNTER — Ambulatory Visit (INDEPENDENT_AMBULATORY_CARE_PROVIDER_SITE_OTHER): Payer: Medicare Other | Admitting: Family Medicine

## 2016-11-12 VITALS — BP 118/82 | HR 82 | Temp 98.3°F | Wt 212.0 lb

## 2016-11-12 DIAGNOSIS — M545 Low back pain, unspecified: Secondary | ICD-10-CM

## 2016-11-12 DIAGNOSIS — J441 Chronic obstructive pulmonary disease with (acute) exacerbation: Secondary | ICD-10-CM | POA: Diagnosis not present

## 2016-11-12 LAB — POC URINALSYSI DIPSTICK (AUTOMATED)
BILIRUBIN UA: NEGATIVE
GLUCOSE UA: NEGATIVE
Ketones, UA: NEGATIVE
Leukocytes, UA: NEGATIVE
NITRITE UA: NEGATIVE
Protein, UA: NEGATIVE
RBC UA: NEGATIVE
Spec Grav, UA: 1.015 (ref 1.010–1.025)
Urobilinogen, UA: 0.2 E.U./dL
pH, UA: 6 (ref 5.0–8.0)

## 2016-11-12 MED ORDER — PREDNISONE 20 MG PO TABS: ORAL_TABLET | ORAL | 0 refills | Status: DC

## 2016-11-12 NOTE — Patient Instructions (Signed)
Please take symbicort 2 puffs twice a day  I have sent in a prescription for prednisone for your breathing and your back strain   Back Exercises If you have pain in your back, do these exercises 2-3 times each day or as told by your doctor. When the pain goes away, do the exercises once each day, but repeat the steps more times for each exercise (do more repetitions). If you do not have pain in your back, do these exercises once each day or as told by your doctor. Exercises Single Knee to Chest  Do these steps 3-5 times in a row for each leg: 1. Lie on your back on a firm bed or the floor with your legs stretched out. 2. Bring one knee to your chest. 3. Hold your knee to your chest by grabbing your knee or thigh. 4. Pull on your knee until you feel a gentle stretch in your lower back. 5. Keep doing the stretch for 10-30 seconds. 6. Slowly let go of your leg and straighten it.  Pelvic Tilt  Do these steps 5-10 times in a row: 1. Lie on your back on a firm bed or the floor with your legs stretched out. 2. Bend your knees so they point up to the ceiling. Your feet should be flat on the floor. 3. Tighten your lower belly (abdomen) muscles to press your lower back against the floor. This will make your tailbone point up to the ceiling instead of pointing down to your feet or the floor. 4. Stay in this position for 5-10 seconds while you gently tighten your muscles and breathe evenly.  Cat-Cow  Do these steps until your lower back bends more easily: 1. Get on your hands and knees on a firm surface. Keep your hands under your shoulders, and keep your knees under your hips. You may put padding under your knees. 2. Let your head hang down, and make your tailbone point down to the floor so your lower back is round like the back of a cat. 3. Stay in this position for 5 seconds. 4. Slowly lift your head and make your tailbone point up to the ceiling so your back hangs low (sags) like the back of  a cow. 5. Stay in this position for 5 seconds.  Press-Ups  Do these steps 5-10 times in a row: 1. Lie on your belly (face-down) on the floor. 2. Place your hands near your head, about shoulder-width apart. 3. While you keep your back relaxed and keep your hips on the floor, slowly straighten your arms to raise the top half of your body and lift your shoulders. Do not use your back muscles. To make yourself more comfortable, you may change where you place your hands. 4. Stay in this position for 5 seconds. 5. Slowly return to lying flat on the floor.  Bridges  Do these steps 10 times in a row: 1. Lie on your back on a firm surface. 2. Bend your knees so they point up to the ceiling. Your feet should be flat on the floor. 3. Tighten your butt muscles and lift your butt off of the floor until your waist is almost as high as your knees. If you do not feel the muscles working in your butt and the back of your thighs, slide your feet 1-2 inches farther away from your butt. 4. Stay in this position for 3-5 seconds. 5. Slowly lower your butt to the floor, and let your butt muscles relax.  If this exercise is too easy, try doing it with your arms crossed over your chest. Belly Crunches  Do these steps 5-10 times in a row: 1. Lie on your back on a firm bed or the floor with your legs stretched out. 2. Bend your knees so they point up to the ceiling. Your feet should be flat on the floor. 3. Cross your arms over your chest. 4. Tip your chin a little bit toward your chest but do not bend your neck. 5. Tighten your belly muscles and slowly raise your chest just enough to lift your shoulder blades a tiny bit off of the floor. 6. Slowly lower your chest and your head to the floor.  Back Lifts Do these steps 5-10 times in a row: 1. Lie on your belly (face-down) with your arms at your sides, and rest your forehead on the floor. 2. Tighten the muscles in your legs and your butt. 3. Slowly lift your  chest off of the floor while you keep your hips on the floor. Keep the back of your head in line with the curve in your back. Look at the floor while you do this. 4. Stay in this position for 3-5 seconds. 5. Slowly lower your chest and your face to the floor.  Contact a doctor if:  Your back pain gets a lot worse when you do an exercise.  Your back pain does not lessen 2 hours after you exercise. If you have any of these problems, stop doing the exercises. Do not do them again unless your doctor says it is okay. Get help right away if:  You have sudden, very bad back pain. If this happens, stop doing the exercises. Do not do them again unless your doctor says it is okay. This information is not intended to replace advice given to you by your health care provider. Make sure you discuss any questions you have with your health care provider. Document Released: 05/15/2010 Document Revised: 09/18/2015 Document Reviewed: 06/06/2014 Elsevier Interactive Patient Education  [employer].

## 2016-11-12 NOTE — Progress Notes (Signed)
Subjective:    Patient ID: Brandy Cameron, female    DOB: 30-Apr-1935, 81 y.o.   MRN: [id number]  HPI This is a 81 yo female, accompanied by her sister with whom she lives, who presents today with increased low back pain x several days that is radiating to her groin. No unusual activity. Pain worse in the morning, better after moving around. No medication for pain. No recent falls, no numbness/tingling, no new weakness. No loss of bowel or bladder function. Bowel movements unchanged- regular.   She saw gyn two weeks ago and had pelvic exam for vaginal bleeding x 1 episode. She was also concerned about her mesh sling that she has seen tv advertisements for recall. According to patient, she was told her sling had not moved.   Breathing more labored today. Non productive cough. Uses symbicort qd, rarely uses albuterol. She doesn't ambulate much, usually stands briefly to help with dishes. Walks around her house but avoids much walking when out of house.    Past Medical History:  Diagnosis Date  . Asthma   . CAD (coronary artery disease)    a. LHC 9/11 (in setting of NSTEMI): LAD irregs, mLCx 50, pRCA 50, inf-apical AK;  b. Myoview 9/13: Normal stress nuclear study.  LV Ejection Fraction: 71%   . Carotid stenosis    a. Carotid US 9/13: bilat 0-39%;  b. Carotid US 7/15: no sig stenosis  . Chronic diastolic CHF (congestive heart failure) ([place])    a. Echo 7/15: Mild LVH, mild focal basal septal hypertrophy, EF 50-55%, no RWMA, Gr 1 DD  . COPD (chronic obstructive pulmonary disease) ([place])   . HTN (hypertension)   . Lung cancer [facility])    [name]  Marland Kitchen NSTEMI (non-ST elevated myocardial infarction) ([place])    with only mild to moderate CAD in September of 2011,There was concern for apival ballooning. >> ? Tako-Tsubo syndrome vs vasospasm   Past Surgical History:  Procedure Laterality Date  . CATARACT SURGERY    .  FOOT SURGERY    . GALLBLADDER SURGERY    . KNEE SURGERY    . LUNG LOBECTOMY    . VESICOVAGINAL FISTULA CLOSURE W/ TAH     Family History  Problem Relation Age of Onset  . Heart disease Mother 40  . Asthma Mother   . Diabetes Mother   . Cancer Mother        Salivary  . Emphysema Mother   . Heart disease Unknown        POSITIVE FAMILY HX. OF   . Pancreatitis Unknown        QUESTIONABLE FROM ACE INHIBITORS  . COPD Sister    Social History  Substance Use Topics  . Smoking status: Former Smoker    Packs/day: 1.50    Years: 42.00    Types: Cigarettes    Quit date: 04/26/2006  . Smokeless tobacco: Never Used  . Alcohol use No      Review of Systems Per HPI    Objective:   Physical Exam  Constitutional: She is oriented to person, place, and time. She appears well-developed and well-nourished. No distress.  Obese.   HENT:  Head: Normocephalic and atraumatic.  Mouth/Throat: Oropharynx is clear and moist.  Cardiovascular: Normal rate, regular rhythm and normal heart sounds.   Pulmonary/Chest: Effort normal. She has wheezes (few faint, scattered).  Able to converse comfortably, had dyspnea  with ambulating to bathroom, seemed to have less following visit as she was leaving.   Musculoskeletal:       Right hip: She exhibits no tenderness and no bony tenderness.       Cervical back: She exhibits no tenderness and no deformity.       Thoracic back: She exhibits no tenderness and no deformity.       Lumbar back: She exhibits tenderness (mild bilateral muscle tenderness).  Exam limited as patient unable to be moved to exam table.  Trunk without rash.   Neurological: She is alert and oriented to person, place, and time.  Skin: Skin is warm and dry. She is not diaphoretic.  Psychiatric: She has a normal mood and affect. Her behavior is normal. Judgment and thought content normal.  Vitals reviewed.  BP 118/82   Pulse 82   Temp 98.3 F (36.8 C)   Wt 212 lb (96.2 kg)   SpO2 93%    BMI 40.06 kg/m  O2 sat with walking down to 82 Wt Readings from Last 3 Encounters:  11/12/16 212 lb (96.2 kg)  09/01/16 221 lb (100.2 kg)  09/01/16 225 lb 4 oz (102.2 kg)     Results for orders placed or performed in visit on 11/12/16  POCT Urinalysis Dipstick (Automated)  Result Value Ref Range   Color, UA yellow    Clarity, UA clear    Glucose, UA neg    Bilirubin, UA neg    Ketones, UA neg    Spec Grav, UA 1.015 1.010 - 1.025   Blood, UA neg    pH, UA 6.0 5.0 - 8.0   Protein, UA neg    Urobilinogen, UA 0.2 0.2 or 1.0 E.U./dL   Nitrite, UA neg    Leukocytes, UA Negative Negative       Assessment & Plan:  1. Low back pain without sciatica, unspecified back pain laterality, unspecified chronicity - POCT Urinalysis Dipstick (Automated)- normal - predniSONE (DELTASONE) 20 MG tablet; Take 3 x 3 days, then 2 x 3 days, then 1 x 3 days.  Dispense: 18 tablet; Refill: 0 - seems likely strain, exam reassuring - encouraged her to do gentle stretching, can apply heat - RTC/ER precautions reviewed  2. Chronic obstructive pulmonary disease with acute exacerbation (HCC) - discussed medication and she is only using Symbicort once a day, instructed her to increase to twice a day and reminded her to use albuterol neb/inhaler q4 hours prn wheezing/cough - predniSONE (DELTASONE) 20 MG tablet; Take 3 x 3 days, then 2 x 3 days, then 1 x 3 days.  Dispense: 18 tablet; Refill: 0 - RTC/ER precautions reviewed- if not better by Monday, needs follow up  - needs to make follow up with pulmonary, has appointment with cardiology  Clarene Reamer, FNP-BC  [facility] [place]  11/13/2016 7:20 AM

## 2016-11-13 DIAGNOSIS — R0602 Shortness of breath: Secondary | ICD-10-CM | POA: Diagnosis not present

## 2016-11-13 DIAGNOSIS — J449 Chronic obstructive pulmonary disease, unspecified: Secondary | ICD-10-CM | POA: Diagnosis not present

## 2016-11-13 DIAGNOSIS — Z85118 Personal history of other malignant neoplasm of bronchus and lung: Secondary | ICD-10-CM | POA: Diagnosis not present

## 2016-11-24 ENCOUNTER — Ambulatory Visit (INDEPENDENT_AMBULATORY_CARE_PROVIDER_SITE_OTHER): Payer: Medicare Other | Admitting: Cardiology

## 2016-11-24 ENCOUNTER — Encounter: Payer: Self-pay | Admitting: Cardiology

## 2016-11-24 VITALS — BP 137/79 | HR 100 | Ht 61.0 in | Wt 219.0 lb

## 2016-11-24 DIAGNOSIS — I2510 Atherosclerotic heart disease of native coronary artery without angina pectoris: Secondary | ICD-10-CM

## 2016-11-24 DIAGNOSIS — E7800 Pure hypercholesterolemia, unspecified: Secondary | ICD-10-CM

## 2016-11-24 DIAGNOSIS — R0600 Dyspnea, unspecified: Secondary | ICD-10-CM | POA: Diagnosis not present

## 2016-11-24 DIAGNOSIS — I5032 Chronic diastolic (congestive) heart failure: Secondary | ICD-10-CM

## 2016-11-24 NOTE — Patient Instructions (Signed)
Medication Instructions:   NO CHANGE  Labwork:  Your physician recommends that you return for lab work WHEN CONVENIENT  Follow-Up:  Your physician wants you to follow-up in: [place] will receive a reminder letter in the mail two months in advance. If you don't receive a letter, please call our office to schedule the follow-up appointment.   If you need a refill on your cardiac medications before your next appointment, please call your pharmacy.

## 2016-11-27 LAB — BASIC METABOLIC PANEL
BUN: 14 mg/dL (ref 7–25)
CHLORIDE: 102 mmol/L (ref 98–110)
CO2: 31 mmol/L (ref 20–31)
Calcium: 9.4 mg/dL (ref 8.6–10.4)
Creat: 0.69 mg/dL (ref 0.60–0.93)
GLUCOSE: 111 mg/dL — AB (ref 65–99)
POTASSIUM: 3.9 mmol/L (ref 3.5–5.3)
SODIUM: 143 mmol/L (ref 135–146)

## 2016-11-29 ENCOUNTER — Other Ambulatory Visit: Payer: Self-pay | Admitting: Internal Medicine

## 2016-11-29 ENCOUNTER — Encounter: Payer: Self-pay | Admitting: *Deleted

## 2016-12-03 ENCOUNTER — Telehealth: Payer: Self-pay | Admitting: Cardiology

## 2016-12-03 NOTE — Telephone Encounter (Signed)
Spoke with pt, aware labs were normal. Letter placed in the mail.

## 2016-12-03 NOTE — Telephone Encounter (Signed)
Patient called in to find out her test results from 11-24-16 at [facility].  Please call [phone]

## 2016-12-11 ENCOUNTER — Other Ambulatory Visit: Payer: Self-pay | Admitting: Internal Medicine

## 2016-12-11 DIAGNOSIS — J449 Chronic obstructive pulmonary disease, unspecified: Secondary | ICD-10-CM | POA: Diagnosis not present

## 2016-12-11 DIAGNOSIS — Z85118 Personal history of other malignant neoplasm of bronchus and lung: Secondary | ICD-10-CM | POA: Diagnosis not present

## 2016-12-11 DIAGNOSIS — R0602 Shortness of breath: Secondary | ICD-10-CM | POA: Diagnosis not present

## 2016-12-14 DIAGNOSIS — J449 Chronic obstructive pulmonary disease, unspecified: Secondary | ICD-10-CM | POA: Diagnosis not present

## 2016-12-14 DIAGNOSIS — Z85118 Personal history of other malignant neoplasm of bronchus and lung: Secondary | ICD-10-CM | POA: Diagnosis not present

## 2016-12-14 DIAGNOSIS — R0602 Shortness of breath: Secondary | ICD-10-CM | POA: Diagnosis not present

## 2016-12-15 ENCOUNTER — Telehealth: Payer: Self-pay | Admitting: Internal Medicine

## 2016-12-15 NOTE — Telephone Encounter (Signed)
Call day scheduled earlier appointment, but patient wanted to cancel the appointment completely. She didn't feel like she needed it advised her to call and verify.

## 2016-12-21 ENCOUNTER — Telehealth: Payer: Self-pay

## 2016-12-21 ENCOUNTER — Ambulatory Visit (INDEPENDENT_AMBULATORY_CARE_PROVIDER_SITE_OTHER): Payer: Medicare Other | Admitting: Pulmonary Disease

## 2016-12-21 ENCOUNTER — Telehealth: Payer: Self-pay | Admitting: Pulmonary Disease

## 2016-12-21 ENCOUNTER — Encounter: Payer: Self-pay | Admitting: Pulmonary Disease

## 2016-12-21 VITALS — BP 104/64 | HR 74

## 2016-12-21 DIAGNOSIS — J9611 Chronic respiratory failure with hypoxia: Secondary | ICD-10-CM

## 2016-12-21 DIAGNOSIS — J309 Allergic rhinitis, unspecified: Secondary | ICD-10-CM

## 2016-12-21 DIAGNOSIS — J449 Chronic obstructive pulmonary disease, unspecified: Secondary | ICD-10-CM | POA: Diagnosis not present

## 2016-12-21 MED ORDER — GLYCOPYRROLATE 25 MCG/ML IN SOLN: 25.0000 ug | Freq: Two times a day (BID) | RESPIRATORY_TRACT | 0 refills | Status: DC

## 2016-12-21 MED ORDER — GLYCOPYRROLATE 25 MCG/ML IN SOLN
25.0000 ug | Freq: Two times a day (BID) | RESPIRATORY_TRACT | 0 refills | Status: DC
Start: 2016-12-21 — End: 2016-12-28

## 2016-12-21 NOTE — Patient Instructions (Addendum)
   Continue using the Symbicort inhaler and Albuterol as needed.   I'm going to replace your Atrovent in your nebulizer with a new medication called Carma Leaven that you will use twice daily.  Keep an eye on your blood pressure. You may need to contact your Cardiologist to back off on your Lasix if your blood pressure gets any lower.  I will see you back in 6 months or sooner if needed.  Call me if your breathing isn't getting better on the [place].

## 2016-12-21 NOTE — Telephone Encounter (Signed)
I faxed a new prescription for [place] to [phone]. I will route to follow up.

## 2016-12-21 NOTE — Telephone Encounter (Signed)
Spoke with the pt  She states she no longer wants the o2 through [employer] and wants inogen  Order sent to [facility]

## 2016-12-21 NOTE — Progress Notes (Signed)
Subjective:    Patient ID: Brandy Cameron, female    DOB: 08/12/1935, 81 y.o.   MRN: [id number]  C.C.:  Follow-up for Moderate-Severe COPD, Chronic Hypoxic Respiratory Failure, GERD w/ Dysphagia, Chronic Allergic Rhinitis, & Bilateral Lung Nodules.  HPI  Moderate-severe COPD: Previously on Pulmicort and Perforomist. Subsequently switched to Symbicort and Atrovent nebulized 3 times daily. Treated for exacerbation with prednisone taper at last appointment & educated on proper frequency of Atrovent at last appointment. She is having intermittent wheezing & coughing. She is having more dyspnea on exertion with bathing herself as well as with dressing. She doesn't feel her ProAir inhaler helps at all. She is not using her Atrovent as prescribed.   Chronic hypoxic respiratory failure: Prescribed oxygen at 2 L/m with exertion and while sleeping. She is adherent to her oxygen therapy but feels she cannot get her breath and starts to breath through her mouth. She wants to try a continuous flow portable concentrator.   GERD with dysphagia: Prescribed Zantac. Previously had nausea with Nexium. No abdominal pain or nausea. No reflux or dyspepsia.   Chronic allergic rhinitis: Flonase increased to twice daily at last appointment & patient advised to try over-the-counter antihistamine therapy. Previously had adverse reaction to Singulair.  Bilateral lung nodules: Hypermetabolic on PET/CT imaging. Previously seen by medical oncology and radiation oncology. Previously declined treatment.   Review of Systems She has been feeling light headed and near syncopal with exertion. She does have occasional chest tightness & pressure. She reports sweats when she cannot breathe. No fever or chills. Previously her Lasix was increased to 80mg  daily from 40mg . She has been on this done for some time.   Allergies  Allergen Reactions  . Augmentin [Amoxicillin-Pot Clavulanate] Diarrhea  . Sulfa Antibiotics Rash and  Shortness Of Breath  . Tape Other (See Comments)  . Amlodipine   . Pantoprazole Nausea And Vomiting  . Sulfonamide Derivatives Rash    Current Outpatient Prescriptions on File Prior to Visit  Medication Sig Dispense Refill  . albuterol (PROVENTIL HFA;VENTOLIN HFA) 108 (90 Base) MCG/ACT inhaler Inhale 2 puffs into the lungs every 4 (four) hours as needed for wheezing or shortness of breath. 1 Inhaler 3  . albuterol (PROVENTIL) (2.5 MG/3ML) 0.083% nebulizer solution Take 3 mLs (2.5 mg total) by nebulization every 4 (four) hours as needed for wheezing or shortness of breath. 120 mL 3  . aspirin 81 MG tablet Take 81 mg by mouth daily.      Marland Kitchen atorvastatin (LIPITOR) 20 MG tablet TAKE 1 TABLET(20 MG) BY MOUTH DAILY 90 tablet 1  . budesonide-formoterol (SYMBICORT) 160-4.5 MCG/ACT inhaler Inhale 2 puffs into the lungs 2 (two) times daily. 1 Inhaler 6  . calcium carbonate 1250 MG capsule Take 1,250 mg by mouth daily.     . Cholecalciferol (VITAMIN D3 PO) Take by mouth daily.    . furosemide (LASIX) 40 MG tablet Take 2 tablets (80 mg total) by mouth daily. 180 tablet 3  . losartan (COZAAR) 100 MG tablet TAKE 1 TABLET BY MOUTH DAILY(YEARLY PHYSICAL IS DUE IN JUNE MUST SEE MD FOR REFILLS) 90 tablet 1  . metoprolol tartrate (LOPRESSOR) 25 MG tablet TAKE 1 TABLET BY MOUTH TWICE DAILY 180 tablet 1  . metoprolol tartrate (LOPRESSOR) 25 MG tablet TAKE 1 TABLET BY MOUTH TWICE DAILY 180 tablet 1  . nitroGLYCERIN (NITROSTAT) 0.4 MG SL tablet Place 1 tablet (0.4 mg total) under the tongue every 5 (five) minutes as needed for chest pain. 25  tablet 12  . Omega-3 Fatty Acids (FISH OIL PO) Take by mouth daily.    . OXYGEN Inhale 2 L into the lungs. 2 liters continuous    . potassium chloride SA (K-DUR,KLOR-CON) 20 MEQ tablet TAKE 1 TABLET BY MOUTH DAILY 90 tablet 1  . Spacer/Aero-Holding Chambers (AEROCHAMBER MV) inhaler Use as instructed 1 each 0   No current facility-administered medications on file prior to  visit.     Past Medical History:  Diagnosis Date  . Asthma   . CAD (coronary artery disease)    a. LHC 9/11 (in setting of NSTEMI): LAD irregs, mLCx 50, pRCA 50, inf-apical AK;  b. Myoview 9/13: Normal stress nuclear study.  LV Ejection Fraction: 71%   . Carotid stenosis    a. Carotid US 9/13: bilat 0-39%;  b. Carotid US 7/15: no sig stenosis  . Chronic diastolic CHF (congestive heart failure) ([place])    a. Echo 7/15: Mild LVH, mild focal basal septal hypertrophy, EF 50-55%, no RWMA, Gr 1 DD  . COPD (chronic obstructive pulmonary disease) ([place])   . HTN (hypertension)   . Lung cancer [facility])    [name]  Marland Kitchen NSTEMI (non-ST elevated myocardial infarction) ([place])    with only mild to moderate CAD in September of 2011,There was concern for apival ballooning. >> ? Tako-Tsubo syndrome vs vasospasm    Past Surgical History:  Procedure Laterality Date  . CATARACT SURGERY    . FOOT SURGERY    . GALLBLADDER SURGERY    . KNEE SURGERY    . LUNG LOBECTOMY    . VESICOVAGINAL FISTULA CLOSURE W/ TAH      Family History  Problem Relation Age of Onset  . Heart disease Mother 78  . Asthma Mother   . Diabetes Mother   . Cancer Mother        Salivary  . Emphysema Mother   . Heart disease Unknown        POSITIVE FAMILY HX. OF   . Pancreatitis Unknown        QUESTIONABLE FROM ACE INHIBITORS  . COPD Sister     Social History   Social History  . Marital status: Divorced    Spouse name: N/A  . Number of children: N/A  . Years of education: N/A   Social History Main Topics  . Smoking status: Former Smoker    Packs/day: 1.50    Years: 42.00    Types: Cigarettes    Quit date: 04/26/2006  . Smokeless tobacco: Never Used  . Alcohol use No  . Drug use: No  . Sexual activity: No   Other Topics Concern  . None   Social History Narrative   Originally from [place]. Always lived in [place]. Prior travel to [facility]. 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 Has a dog at home. No birds, mold, or hot tub exposure. No indoor plants. No carpet in her home. Does have draperies.       Objective:   Physical Exam BP 104/64 (BP Location: Right Arm, Patient Position: Sitting, Cuff Size: Large)   Pulse 74   SpO2 95%   General:  Awake. Alert. Obese. Sitting in wheelchair. Accompanied by family today. Integument:  Warm & dry. No rash on exposed skin.  Extremities:  No cyanosis.  HEENT:  Moist mucus membranes. No scleral injection. No oral ulcers Cardiovascular:  Regular rate.  Trace lower extremity edema. Unable to appreciate JVD given body positioning.  Pulmonary:  Symmetrically decreased breath sounds. Otherwise clear to auscultation. Normal work of breathing on supplemental oxygen. Abdomen: Soft. Normal bowel sounds. Protuberant. Musculoskeletal:  Normal bulk and tone. No joint deformity or effusion appreciated.  PFT 01/05/16: FVC 1.03 L (43%) FEV1 0.76 L (43%) FEV1/FVC 0.74 FEF 25-75 0.56 L (41%) positive bronchodilator response TLC 4.05 L (86%) RV 117% ERV 15% DLCO corrected 31% (Hgb 13.7) 11/28/15: FVC 1.09 L (45%) FEV1 0.81 L (45%) FEV1/FVC 0.74 FEF 25-75 0.66 L (49%) positive bronchodilator response TLC 3.90 L (83%) RV 121% ERV 100% DLCO corrected 64% (Hgb 14.6)  6MWT 03/10/16:  Walked 4 minutes / Baseline Sat 98% on RA / Nadir Sat 93% on RA @ 4:00 ( 10/14/15:  Baseline Sat 90% on RA at rest / Nadir Sat 87% on RA (required 2 L/m with exertion to maintain & test stopped early due to dyspnea and chest discomfort)  IMAGING CT CHEST W/ CONTRAST 08/13/16 (personally reviewed by me):   No significant change in right upper lobe nodule measuring 1.6 cm in greatest dimension and left medial opacity. No new nodule appreciated. Pleural thickening on the left unchanged. No pleural effusion appreciated. No pericardial effusion. No pathologic mediastinal adenopathy.  PET CT 11/11/15 (previously  reviewed by me): Medial left upper lobe patchy hypermetabolic opacity with maximal dimension 4.7 cm & max SUV 11.8. Right upper lobe spiculated nodule with maximal dimension 1.6 cm with max SUV 2.8. 6 mm right lower lobe nodule noted. No other areas of hypermetabolism or adenopathy appreciated.  BARIUM SWALLOW 09/08/15 (per radiologist): Patent esophagus. No stricture or mass. Small hiatal hernia. Mildly abnormal motility. No reflux.  MAXILLOFACIAL CT W/O 09/02/15 (per radiologist): Mild mucosal thickening inferior lateral/posterior aspect right maxillary sinus. No air-fluid levels.  CT  CHEST W/O 08/04/15 (previously reviewed by me): Volume loss with postsurgical change in left lung. Low-attenuation soft tissue density along posterior & superior aspect surgical margin. 9 mm irregular nodule right lung apex. 4 mm triangular groundglass nodule right minor fissure. 6 mm nodule right lower lobe. Or millimeter nodule right middle lobe. Additional tiny bilateral pulmonary nodules. No pleural effusion or thickening. No pathologic hilar, mediastinal, or axillary lymphadenopathy. Precarinal lymph node with a fatty hilum. Nodule in right apex is definitely new when compared with prior imaging.   CXR PA/LAT 01/01/15 (previously reviewed by me): Blunting of right posterior cardiac angle. No pleural effusion appreciated. Fullness in the left hilum that appears chronic and is likely residual scar tissue from prior surgery. No new nodule or opacity appreciated. Heart normal in size. Mediastinum otherwise normal in contour.   CT CHEST W/ 12/15/11 (previously reviewed by me): 6 mm nodule in right lower lobe that is noncalcified and rounded per radiologist present since 01/08/09 & unchanged. No pathologic mediastinal or hilar adenopathy. Soft tissue within the anterior mediastinum corresponding to fullness in the left hilum. Subcentimeter nodular density in left upper lobe reportedly unchanged. Hepatic steatosis noted.    CARDIAC EKG 03/10/16 (previously reviewed by me): Normal sinus rhythm. No Q waves, ST segment changes, or T-wave changes that would suggest ischemia.  EKG 10/14/15 (previously reviewed by me): Normal sinus rhythm. QTC 403 ms. Isoelectric T-wave lead V2. Artifact lead V5. Otherwise no signs of ischemia or conduction abnormality.  TTE (07/04/15): LV normal in size with EF 55%. No regional wall motion abnormalities. Grade 1 diastolic dysfunction. LA & RA normal in size. RV normal in size and function.  No aortic stenosis or regurgitation. No mitral stenosis. No pulmonic regurgitation. No significant tricuspid regurgitation. No pericardial effusion.  NUCLEAR STRESS TEST (07/03/15): Normal study with low risk. No T-wave inversion or ST segment deviation. EF 66% with hyperdynamic LV.  PATHOLOGY L Parotid Gland FNA (11/18/14): Benign fibrofatty soft tissue. No features of salivary gland present. LUL Wedge Resection (02/10/09): Invasive adenocarcinoma 2.0 cm LUL Additional Parenchymal Margin (02/10/09): No tumor identified 4L Lymph Node (02/10/09): No tumor identified 10L Lymph Node (02/10/09): No tumor identified  MICROBIOLOGY SPUTUM CTX (09/03/15):  AFB ngtd / Fungal ngtd  LABS 08/29/15 IgE:  15 RAST Panel: [place] 0.14 / [place] 0.12 / [place] 0.15 / [place] 0.12  06/20/15 BMP: 143/4.7/99/33/14/0.71/88/9.4 BNP: 62  01/16/15 CBC: 16.7/15.0/45.6/161 ProBNP: 33  02/06/09 ABG on RA: 7.395 / 42.7 / 125 / 98.6%    Assessment & Plan:  81 y.o. female with moderate-severe COPD and chronic hypoxic respiratory failure. Her CT imaging from April does not show progression of her lung nodules. Overall her increased dyspnea I feel is likely multifactorial from not only her weight and immobility with physical deconditioning but also her underlying lung disease. She still is not using her Atrovent as prescribed. She does not feel this is as effective as her Symbicort inhaler. As such, I am  transitioning her over to a twice daily aerosolized LAMA. Her reflux and allergic rhinitis seem to be reasonably well controlled. I did caution the patient to monitor her blood pressure closely to ensure that she is not over diuresing with Lasix. I instructed the patient to contact me if she had any new breathing problems or questions before her next appointment.  1. Moderate-severe COPD: Continuing Symbicort. Replacing Atrovent in nebulizer with [employer]. Patient to notify me if symptoms do not improve. 2. Chronic hypoxic respiratory failure: Prescribing the patient a portable oxygen concentrator with continuous flow. Also prescribing the patient a folding wheelchair to use as needed. 3. GERD with dysphagia: Continuing Zantac. No new medications. 4. Chronic allergic rhinitis: Continuing patient on Flonase. No new medications. 5. Bilateral lung nodules: Likely recurrence of malignancy. Declined further treatment. 6. Health maintenance: Status post Tdap October 2017, Prevnar September 2016 & Pneumovax September 2017. 7. Follow-up: Return to clinic in 6 months or sooner if needed.  Sonia Baller Ashok Cordia, M.D. [facility] Pager:  [phone] After 3pm or if no response, call [phone] 12:48 PM 12/21/16

## 2016-12-22 ENCOUNTER — Telehealth: Payer: Self-pay | Admitting: Pulmonary Disease

## 2016-12-22 NOTE — Telephone Encounter (Signed)
Patient wishes to not use [place] (see note from 12/21/16). Nothing further needed.

## 2016-12-22 NOTE — Telephone Encounter (Deleted)
[place] Brandy Cameron [phone]) states pt mask needs to be at 5L to run with the mask that has been ordered, states they spoke with the patient about getting the mask.Marland KitchenMarland Kitchen

## 2016-12-26 ENCOUNTER — Other Ambulatory Visit: Payer: Self-pay | Admitting: Internal Medicine

## 2016-12-26 DIAGNOSIS — I2510 Atherosclerotic heart disease of native coronary artery without angina pectoris: Secondary | ICD-10-CM

## 2016-12-28 MED ORDER — GLYCOPYRROLATE 25 MCG/ML IN SOLN: 25.0000 ug | Freq: Two times a day (BID) | RESPIRATORY_TRACT | 0 refills | Status: DC

## 2016-12-28 NOTE — Telephone Encounter (Signed)
RX was faxed over on 12/21/16

## 2016-12-28 NOTE — Telephone Encounter (Signed)
[name] with [facility] is requesting for prescription [name]) to be faxed [phone]. Ph: [phone].

## 2016-12-28 NOTE — Addendum Note (Signed)
Addended by: Jannette Spanner on: 12/28/2016 09:43 AM   Modules accepted: Orders

## 2016-12-30 ENCOUNTER — Telehealth: Payer: Self-pay | Admitting: Pulmonary Disease

## 2016-12-30 MED ORDER — GLYCOPYRROLATE 25 MCG/ML IN SOLN: 25.0000 ug | Freq: Two times a day (BID) | RESPIRATORY_TRACT | 11 refills | Status: DC

## 2016-12-30 NOTE — Telephone Encounter (Signed)
Called and spoke with pt and [facility] [place] and [facility] do not carry this med.  [facility] [place], [place] and pt is aware.

## 2017-01-04 DIAGNOSIS — R0602 Shortness of breath: Secondary | ICD-10-CM | POA: Diagnosis not present

## 2017-01-04 DIAGNOSIS — J449 Chronic obstructive pulmonary disease, unspecified: Secondary | ICD-10-CM | POA: Diagnosis not present

## 2017-01-04 DIAGNOSIS — Z85118 Personal history of other malignant neoplasm of bronchus and lung: Secondary | ICD-10-CM | POA: Diagnosis not present

## 2017-01-11 DIAGNOSIS — R0602 Shortness of breath: Secondary | ICD-10-CM | POA: Diagnosis not present

## 2017-01-11 DIAGNOSIS — Z85118 Personal history of other malignant neoplasm of bronchus and lung: Secondary | ICD-10-CM | POA: Diagnosis not present

## 2017-01-11 DIAGNOSIS — J449 Chronic obstructive pulmonary disease, unspecified: Secondary | ICD-10-CM | POA: Diagnosis not present

## 2017-01-13 ENCOUNTER — Other Ambulatory Visit: Payer: Self-pay | Admitting: Internal Medicine

## 2017-01-14 DIAGNOSIS — J449 Chronic obstructive pulmonary disease, unspecified: Secondary | ICD-10-CM | POA: Diagnosis not present

## 2017-01-14 DIAGNOSIS — R0602 Shortness of breath: Secondary | ICD-10-CM | POA: Diagnosis not present

## 2017-01-14 DIAGNOSIS — Z85118 Personal history of other malignant neoplasm of bronchus and lung: Secondary | ICD-10-CM | POA: Diagnosis not present

## 2017-01-21 ENCOUNTER — Emergency Department (INDEPENDENT_AMBULATORY_CARE_PROVIDER_SITE_OTHER): Payer: Medicare Other

## 2017-01-21 ENCOUNTER — Emergency Department
Admission: EM | Admit: 2017-01-21 | Discharge: 2017-01-21 | Disposition: A | Discharge status: Home / Self Care | Payer: Medicare Other | Source: Home / Self Care | Attending: Family Medicine | Admitting: Family Medicine

## 2017-01-21 ENCOUNTER — Telehealth: Payer: Self-pay | Admitting: Pulmonary Disease

## 2017-01-21 ENCOUNTER — Encounter: Payer: Self-pay | Admitting: Emergency Medicine

## 2017-01-21 DIAGNOSIS — M542 Cervicalgia: Secondary | ICD-10-CM | POA: Diagnosis not present

## 2017-01-21 DIAGNOSIS — M47812 Spondylosis without myelopathy or radiculopathy, cervical region: Secondary | ICD-10-CM | POA: Diagnosis not present

## 2017-01-21 DIAGNOSIS — R5383 Other fatigue: Secondary | ICD-10-CM | POA: Diagnosis not present

## 2017-01-21 LAB — TSH: TSH: 1.41 m[IU]/L (ref 0.40–4.50)

## 2017-01-21 LAB — T4, FREE: Free T4: 1.1 ng/dL (ref 0.8–1.8)

## 2017-01-21 LAB — T3, FREE: T3, Free: 3.4 pg/mL (ref 2.3–4.2)

## 2017-01-21 MED ORDER — PREDNISONE 20 MG PO TABS: ORAL_TABLET | ORAL | 0 refills | Status: DC

## 2017-01-21 MED ORDER — METHYLPREDNISOLONE SODIUM SUCC 125 MG IJ SOLR: 80.0000 mg | Freq: Once | INTRAMUSCULAR | Status: DC

## 2017-01-21 NOTE — Discharge Instructions (Signed)
Begin prednisone Saturday 01/22/17. Avoid using [place] for now. If symptoms become significantly worse during the night or over the weekend, proceed to the local emergency room.

## 2017-01-21 NOTE — ED Triage Notes (Signed)
Patient started new inhaler for her COPD 2 days ago; has noticed soreness of throat with each consecutive treatment; last inhalation around 1100 today. She arrived ambulatory; using her nasal O2 at 2L/; she has driven herself to our clinic.

## 2017-01-21 NOTE — ED Provider Notes (Signed)
[facility]    CSN: [id number] Arrival date & time: 01/21/17  1241     History   Chief Complaint Chief Complaint  Patient presents with  . Shortness of Breath  . Sore Throat    HPI Brandy Cameron is a 81 y.o. female.   Patient noticed vague soreness in her anterior neck during inspiration last night, now significantly worse this morning.  She points to an area over her thyroid as the location of pain.  She denies sore throat or difficulty swallowing.  No increase in shortness of breath, and no chest pain.  She began using Lonhala nebulizer solution about 3 weeks ago but has had no adverse effects when inhaling this medication until today when any inhalation is uncomfortable    The history is provided by the patient.    Past Medical History:  Diagnosis Date  . Asthma   . CAD (coronary artery disease)    a. LHC 9/11 (in setting of NSTEMI): LAD irregs, mLCx 50, pRCA 50, inf-apical AK;  b. Myoview 9/13: Normal stress nuclear study.  LV Ejection Fraction: 71%   . Carotid stenosis    a. Carotid US 9/13: bilat 0-39%;  b. Carotid US 7/15: no sig stenosis  . Chronic diastolic CHF (congestive heart failure) ([place])    a. Echo 7/15: Mild LVH, mild focal basal septal hypertrophy, EF 50-55%, no RWMA, Gr 1 DD  . COPD (chronic obstructive pulmonary disease) ([place])   . HTN (hypertension)   . Lung cancer [facility])    [name]  Marland Kitchen NSTEMI (non-ST elevated myocardial infarction) ([place])    with only mild to moderate CAD in September of 2011,There was concern for apival ballooning. >> ? Tako-Tsubo syndrome vs vasospasm    Patient Active Problem List   Diagnosis Date Noted  . Right upper quadrant abdominal tenderness without rebound tenderness 09/01/2016  . Chronic respiratory failure ([place]) 10/14/2015  . Hyperglycemia 10/07/2015  . Menopause ovarian failure 10/07/2015  . Encounter for colorectal cancer  screening 10/07/2015  . COPD (chronic obstructive pulmonary disease) ([place]) 08/29/2015  . GERD (gastroesophageal reflux disease) 08/29/2015  . Acute diastolic congestive heart failure ([place]) 10/29/2013  . Cerebrovascular disease 08/08/2013  . ASTHMA 12/26/2010  . CAD (coronary artery disease) 11/11/2010  . Hypertension 11/11/2010  . Hyperlipidemia with target LDL less than 70 11/11/2010  . Primary cancer of left upper lobe of lung ([place]) 11/11/2010    Past Surgical History:  Procedure Laterality Date  . CATARACT SURGERY    . FOOT SURGERY    . GALLBLADDER SURGERY    . KNEE SURGERY    . LUNG LOBECTOMY    . VESICOVAGINAL FISTULA CLOSURE W/ TAH      OB History    No data available       Home Medications    Prior to Admission medications   Medication Sig Start Date End Date Taking? Authorizing Provider  albuterol (PROVENTIL HFA;VENTOLIN HFA) 108 (90 Base) MCG/ACT inhaler Inhale 2 puffs into the lungs every 4 (four) hours as needed for wheezing or shortness of breath. 07/08/16   [name], MD  albuterol (PROVENTIL) (2.5 MG/3ML) 0.083% nebulizer solution Take 3 mLs (2.5 mg total) by nebulization every 4 (four) hours as needed for wheezing or shortness of breath. 07/08/16   [name], MD  aspirin 81 MG tablet Take 81 mg by mouth daily.  [provider]  atorvastatin (LIPITOR) 20 MG tablet TAKE 1 TABLET(20 MG) BY MOUTH DAILY 06/08/16   [name], MD  atorvastatin (LIPITOR) 20 MG tablet TAKE 1 TABLET(20 MG) BY MOUTH DAILY 12/27/16   [name], MD  budesonide-formoterol [facility]) 160-4.5 MCG/ACT inhaler Inhale 2 puffs into the lungs 2 (two) times daily. 10/29/16   [name], MD  calcium carbonate 1250 MG capsule Take 1,250 mg by mouth daily.     [provider]  Cholecalciferol (VITAMIN D3 PO) Take by mouth daily.    [provider]  furosemide (LASIX) 40 MG tablet Take 2 tablets (80 mg total) by mouth daily. 09/10/16    Lelon Perla, MD  Glycopyrrolate (LONHALA MAGNAIR STARTER KIT) 25 MCG/ML SOLN Inhale 25 mcg into the lungs 2 (two) times daily. DX: J44.9 12/30/16   [name], MD  losartan (COZAAR) 100 MG tablet TAKE 1 TABLET BY MOUTH DAILY(YEARLY PHYSICAL IS DUE IN JUNE MUST SEE MD FOR REFILLS) 11/08/16   [name], MD  metoprolol tartrate (LOPRESSOR) 25 MG tablet TAKE 1 TABLET BY MOUTH TWICE DAILY 11/10/16   [name], MD  metoprolol tartrate (LOPRESSOR) 25 MG tablet TAKE 1 TABLET BY MOUTH TWICE DAILY 12/12/16   [name], MD  metoprolol tartrate (LOPRESSOR) 25 MG tablet TAKE 1 TABLET BY MOUTH TWICE DAILY 01/13/17   [name], MD  nitroGLYCERIN (NITROSTAT) 0.4 MG SL tablet Place 1 tablet (0.4 mg total) under the tongue every 5 (five) minutes as needed for chest pain. 10/25/16 01/23/17  Lelon Perla, MD  Omega-3 Fatty Acids (FISH OIL PO) Take by mouth daily.    [provider]  OXYGEN Inhale 2 L into the lungs. 2 liters continuous    [provider]  potassium chloride SA (K-DUR,KLOR-CON) 20 MEQ tablet TAKE 1 TABLET BY MOUTH DAILY 11/29/16   [name], MD  predniSONE (DELTASONE) 20 MG tablet Take one tab by mouth twice daily for 3 days, then one daily for 3 days. Take with food. 01/21/17   Kandra Nicolas, MD  Spacer/Aero-Holding Chambers (AEROCHAMBER MV) inhaler Use as instructed 08/29/15   [name], MD    Family History Family History  Problem Relation Age of Onset  . Heart disease Mother 23  . Asthma Mother   . Diabetes Mother   . Cancer Mother        Salivary  . Emphysema Mother   . Heart disease Unknown        POSITIVE FAMILY HX. OF   . Pancreatitis Unknown        QUESTIONABLE FROM ACE INHIBITORS  . COPD Sister     Social History Social History  Substance Use Topics  . Smoking status: Former Smoker    Packs/day: 1.50    Years: 42.00    Types: Cigarettes    Quit date: 04/26/2006  . Smokeless tobacco: Never Used  . Alcohol use  No     Allergies   Augmentin [amoxicillin-pot clavulanate]; Sulfa antibiotics; Tape; Amlodipine; Pantoprazole; and Sulfonamide derivatives   Review of Systems Review of Systems  Constitutional: Positive for fatigue. Negative for activity change, appetite change, chills, diaphoresis and fever.  HENT: Negative for trouble swallowing and voice change.        Soreness/pain in anterior neck with inspiration  Eyes: Negative.   Respiratory: Negative for choking and chest tightness.   Cardiovascular: Negative.   Gastrointestinal: Negative.   Genitourinary: Negative.  Musculoskeletal: Negative.   Skin: Negative.   Neurological: Negative for headaches.     Physical Exam Triage Vital Signs ED Triage Vitals  Enc Vitals Group     BP 01/21/17 1300 129/78     Pulse Rate 01/21/17 1300 (!) 103     Resp 01/21/17 1300 (!) 24     Temp 01/21/17 1300 98.9 F (37.2 C)     Temp Source 01/21/17 1300 Oral     SpO2 01/21/17 1300 94 %     Weight --      Height --      Head Circumference --      Peak Flow --      Pain Score 01/21/17 1306 10     Pain Loc --      Pain Edu? --      Excl. in [place]? --    No data found.   Updated Vital Signs BP 129/78 (BP Location: Right Arm)   Pulse (!) 103   Temp 98.9 F (37.2 C) (Oral)   Resp (!) 24   SpO2 94%   Visual Acuity Right Eye Distance:   Left Eye Distance:   Bilateral Distance:    Right Eye Near:   Left Eye Near:    Bilateral Near:     Physical Exam  Constitutional: She appears well-developed and well-nourished. No distress.  HENT:  Head: Normocephalic.  Right Ear: Tympanic membrane, external ear and ear canal normal.  Left Ear: Tympanic membrane, external ear and ear canal normal.  Nose: Nose normal.  Mouth/Throat: Oropharynx is clear and moist.  Eyes: Pupils are equal, round, and reactive to light. Conjunctivae are normal.  Neck: Neck supple. No thyromegaly present.  There is distinct tenderness to palpation over patient's  anterior thyroid, although it does not appear enlarged.  Palpation there recreates her discomfort.  The tenderness extends somewhat to upper aspect of sternum.  Cardiovascular: Normal rate and normal heart sounds.   Rate 84  Pulmonary/Chest: Effort normal. No stridor. No respiratory distress. She exhibits tenderness.    Breath sounds symmetrically decreased.  Abdominal: There is no tenderness.  Lymphadenopathy:    She has no cervical adenopathy.  Neurological: She is alert.  Skin: Skin is warm and dry. No rash noted.  Nursing note and vitals reviewed.    UC Treatments / Results  Labs (all labs ordered are listed, but only abnormal results are displayed) Labs Reviewed  TSH  T4, FREE  T3, FREE    EKG  EKG Interpretation None       Radiology Dg Neck Soft Tissue  Result Date: 01/21/2017 CLINICAL DATA:  Pain in the anterior neck with inspiration EXAM: NECK SOFT TISSUES - 1+ VIEW COMPARISON:  None. FINDINGS: Prevertebral soft tissue thickness is within normal limits. No retropharyngeal gas collection. The shoulder obscures the subglottic trachea on the lateral view. Left carotid calcification. Partially visualized postsurgical change in the left lung apex. Degenerative changes at C4-C5. IMPRESSION: Negative. Electronically Signed   By: Donavan Foil M.D.   On: 01/21/2017 14:16    Procedures Procedures (including critical care time)  Medications Ordered in UC Medications  methylPREDNISolone sodium succinate (SOLU-MEDROL) 125 mg/2 mL injection 80 mg (not administered)     Initial Impression / Assessment and Plan / UC Course  I have reviewed the triage vital signs and the nursing notes.  Pertinent labs & imaging results that were available during my care of the patient were reviewed by me and considered in my medical decision making (  see chart for details).    Suspect acute thyroiditis.  Review of thyroid US done 08/04/15:  Bilateral thyroid nodules consistent with goiter  with hypervascular dominant nodule on the left. Administered Solumedrol 48m IM Begin prednisone burst/taper Saturday 01/22/17. TSH, free T4, free T3 pending. Avoid using Lonhala for now. If symptoms become significantly worse during the night or over the weekend, proceed to the local emergency room.  Followup with Family Doctor in about 3 days.    Final Clinical Impressions(s) / UC Diagnoses   Final diagnoses:  Anterior neck pain    New Prescriptions New Prescriptions   PREDNISONE (DELTASONE) 20 MG TABLET    Take one tab by mouth twice daily for 3 days, then one daily for 3 days. Take with food.         BKandra Nicolas MD 01/21/17 1451

## 2017-01-21 NOTE — ED Triage Notes (Signed)
Patient now admits her newest inhaler was started 12/30/16. PK

## 2017-01-21 NOTE — Telephone Encounter (Signed)
She needs to be seen either in our office or the closest emergency department or urgent care immediately/ASAP. Tell her to stop the new medication Brandy Cameron) and go back to using her Atrovent nebulized tid if she is unwilling. Thanks.

## 2017-01-21 NOTE — Telephone Encounter (Signed)
Spoke with pt, advised recommendations from Dr. Ashok Cordia. Pt agreed to go to an urgent care in [place] now. Nothing further is needed.

## 2017-01-21 NOTE — Telephone Encounter (Signed)
Spoke with pt's states she was prescribed Lonhala on 12/30/16. Pt feels that this medication is casuing some throat soreness and swelling. Pt states throat hurts all the way to her adams apple and hurts when breathing. Symptoms started yesterday.    JN please advise. Thanks.

## 2017-01-22 ENCOUNTER — Telehealth: Payer: Self-pay | Admitting: Emergency Medicine

## 2017-01-22 NOTE — Telephone Encounter (Signed)
Courtesy call to patient. Patient advised to CB with questions or concerns. TSH normal

## 2017-01-24 ENCOUNTER — Telehealth: Payer: Self-pay | Admitting: Emergency Medicine

## 2017-01-24 NOTE — Telephone Encounter (Signed)
Spoke with patient who is improving; gave her all thyroid lab test results which were wnl.

## 2017-02-03 DIAGNOSIS — J449 Chronic obstructive pulmonary disease, unspecified: Secondary | ICD-10-CM | POA: Diagnosis not present

## 2017-02-03 DIAGNOSIS — R0602 Shortness of breath: Secondary | ICD-10-CM | POA: Diagnosis not present

## 2017-02-03 DIAGNOSIS — Z85118 Personal history of other malignant neoplasm of bronchus and lung: Secondary | ICD-10-CM | POA: Diagnosis not present

## 2017-02-10 DIAGNOSIS — R0602 Shortness of breath: Secondary | ICD-10-CM | POA: Diagnosis not present

## 2017-02-10 DIAGNOSIS — Z85118 Personal history of other malignant neoplasm of bronchus and lung: Secondary | ICD-10-CM | POA: Diagnosis not present

## 2017-02-10 DIAGNOSIS — J449 Chronic obstructive pulmonary disease, unspecified: Secondary | ICD-10-CM | POA: Diagnosis not present

## 2017-02-13 DIAGNOSIS — R0602 Shortness of breath: Secondary | ICD-10-CM | POA: Diagnosis not present

## 2017-02-13 DIAGNOSIS — J449 Chronic obstructive pulmonary disease, unspecified: Secondary | ICD-10-CM | POA: Diagnosis not present

## 2017-02-13 DIAGNOSIS — Z85118 Personal history of other malignant neoplasm of bronchus and lung: Secondary | ICD-10-CM | POA: Diagnosis not present

## 2017-02-14 ENCOUNTER — Other Ambulatory Visit: Payer: Medicare Other

## 2017-02-21 ENCOUNTER — Ambulatory Visit: Payer: Medicare Other | Admitting: Internal Medicine

## 2017-03-01 NOTE — Progress Notes (Signed)
[place] [facility] [address], [place], [place] [address] [phone]  Date:  03/02/2017   Name:  Brandy Cameron   DOB:  09/14/1935   MRN:  [id number]  PCP:  [name], MD    Chief Complaint: No chief complaint on file.   History of Present Illness:  Brandy Cameron is a 81 y.o. very pleasant female patient who presents with the following:  Pt of Dr. Ronnald Ramp with COPD, CHF, CAD, lung cancer Here today with concern of illness She notes a possible cold- she has noted a runny nose, chest congestion, coughing up mucus She has not noted any fever She did have a ST, but this resolved  She does use her oxygen all the time- no change here  Lung cancer surgery she thinks in 2011.  However the cancer has come back, they are not able to operate again   ? Flu shot this year- she does not think that she has had this as of yet   Dr. Ashok Cordia is her pulmonologist last visit with him in August:  Moderate-severe COPD: Previously on Pulmicort and Perforomist. Subsequently switched to Symbicort and Atrovent nebulized 3 times daily. Treated for exacerbation with prednisone taper at last appointment & educated on proper frequency of Atrovent at last appointment. She is having intermittent wheezing & coughing. She is having more dyspnea on exertion with bathing herself as well as with dressing. She doesn't feel her ProAir inhaler helps at all. She is not using her Atrovent as prescribed.   Chronic hypoxic respiratory failure: Prescribed oxygen at 2 L/m with exertion and while sleeping. She is adherent to her oxygen therapy but feels she cannot get her breath and starts to breath through her mouth. She wants to try a continuous flow portable concentrator.     Patient Active Problem List   Diagnosis Date Noted  . Right upper quadrant abdominal tenderness without rebound tenderness 09/01/2016  . Chronic respiratory failure ([place]) 10/14/2015   . Hyperglycemia 10/07/2015  . Menopause ovarian failure 10/07/2015  . Encounter for colorectal cancer screening 10/07/2015  . COPD (chronic obstructive pulmonary disease) ([place]) 08/29/2015  . GERD (gastroesophageal reflux disease) 08/29/2015  . Acute diastolic congestive heart failure ([place]) 10/29/2013  . Cerebrovascular disease 08/08/2013  . ASTHMA 12/26/2010  . CAD (coronary artery disease) 11/11/2010  . Hypertension 11/11/2010  . Hyperlipidemia with target LDL less than 70 11/11/2010  . Primary cancer of left upper lobe of lung ([place]) 11/11/2010    Past Medical History:  Diagnosis Date  . Asthma   . CAD (coronary artery disease)    a. LHC 9/11 (in setting of NSTEMI): LAD irregs, mLCx 50, pRCA 50, inf-apical AK;  b. Myoview 9/13: Normal stress nuclear study.  LV Ejection Fraction: 71%   . Carotid stenosis    a. Carotid US 9/13: bilat 0-39%;  b. Carotid US 7/15: no sig stenosis  . Chronic diastolic CHF (congestive heart failure) ([place])    a. Echo 7/15: Mild LVH, mild focal basal septal hypertrophy, EF 50-55%, no RWMA, Gr 1 DD  . COPD (chronic obstructive pulmonary disease) ([place])   . HTN (hypertension)   . Lung cancer [facility])    [name]  Marland Kitchen NSTEMI (non-ST elevated myocardial infarction) ([place])    with only mild to moderate CAD in September of 2011,There was concern for apival  ballooning. >> ? Tako-Tsubo syndrome vs vasospasm    Past Surgical History:  Procedure Laterality Date  . CATARACT SURGERY    . FOOT SURGERY    . GALLBLADDER SURGERY    . KNEE SURGERY    . LUNG LOBECTOMY    . VESICOVAGINAL FISTULA CLOSURE W/ TAH      Social History   Tobacco Use  . Smoking status: Former Smoker    Packs/day: 1.50    Years: 42.00    Pack years: 63.00    Types: Cigarettes    Last attempt to quit: 04/26/2006    Years since quitting: 10.8  . Smokeless tobacco: Never Used  Substance Use Topics  . Alcohol  use: No    Alcohol/week: 0.0 oz  . Drug use: No    Family History  Problem Relation Age of Onset  . Heart disease Mother 20  . Asthma Mother   . Diabetes Mother   . Cancer Mother        Salivary  . Emphysema Mother   . Heart disease Unknown        POSITIVE FAMILY HX. OF   . Pancreatitis Unknown        QUESTIONABLE FROM ACE INHIBITORS  . COPD Sister     Allergies  Allergen Reactions  . Augmentin [Amoxicillin-Pot Clavulanate] Diarrhea  . Sulfa Antibiotics Rash and Shortness Of Breath  . Tape Other (See Comments)  . Amlodipine   . Pantoprazole Nausea And Vomiting  . Sulfonamide Derivatives Rash    Medication list has been reviewed and updated.  Current Outpatient Medications on File Prior to Visit  Medication Sig Dispense Refill  . albuterol (PROVENTIL HFA;VENTOLIN HFA) 108 (90 Base) MCG/ACT inhaler Inhale 2 puffs into the lungs every 4 (four) hours as needed for wheezing or shortness of breath. 1 Inhaler 3  . albuterol (PROVENTIL) (2.5 MG/3ML) 0.083% nebulizer solution Take 3 mLs (2.5 mg total) by nebulization every 4 (four) hours as needed for wheezing or shortness of breath. 120 mL 3  . aspirin 81 MG tablet Take 81 mg by mouth daily.      Marland Kitchen atorvastatin (LIPITOR) 20 MG tablet TAKE 1 TABLET(20 MG) BY MOUTH DAILY 90 tablet 1  . atorvastatin (LIPITOR) 20 MG tablet TAKE 1 TABLET(20 MG) BY MOUTH DAILY 90 tablet 0  . budesonide-formoterol (SYMBICORT) 160-4.5 MCG/ACT inhaler Inhale 2 puffs into the lungs 2 (two) times daily. 1 Inhaler 6  . calcium carbonate 1250 MG capsule Take 1,250 mg by mouth daily.     . Cholecalciferol (VITAMIN D3 PO) Take by mouth daily.    . furosemide (LASIX) 40 MG tablet Take 2 tablets (80 mg total) by mouth daily. 180 tablet 3  . Glycopyrrolate (LONHALA MAGNAIR STARTER KIT) 25 MCG/ML SOLN Inhale 25 mcg into the lungs 2 (two) times daily. DX: J44.9 60 mL 11  . losartan (COZAAR) 100 MG tablet TAKE 1 TABLET BY MOUTH DAILY(YEARLY PHYSICAL IS DUE IN JUNE  MUST SEE MD FOR REFILLS) 90 tablet 1  . metoprolol tartrate (LOPRESSOR) 25 MG tablet TAKE 1 TABLET BY MOUTH TWICE DAILY 180 tablet 1  . metoprolol tartrate (LOPRESSOR) 25 MG tablet TAKE 1 TABLET BY MOUTH TWICE DAILY 180 tablet 1  . metoprolol tartrate (LOPRESSOR) 25 MG tablet TAKE 1 TABLET BY MOUTH TWICE DAILY 60 tablet 3  . nitroGLYCERIN (NITROSTAT) 0.4 MG SL tablet Place 1 tablet (0.4 mg total) under the tongue every 5 (five) minutes as needed for chest pain. 25 tablet 12  .  Omega-3 Fatty Acids (FISH OIL PO) Take by mouth daily.    . OXYGEN Inhale 2 L into the lungs. 2 liters continuous    . potassium chloride SA (K-DUR,KLOR-CON) 20 MEQ tablet TAKE 1 TABLET BY MOUTH DAILY 90 tablet 1  . predniSONE (DELTASONE) 20 MG tablet Take one tab by mouth twice daily for 3 days, then one daily for 3 days. Take with food. 9 tablet 0  . Spacer/Aero-Holding Chambers (AEROCHAMBER MV) inhaler Use as instructed 1 each 0   No current facility-administered medications on file prior to visit.     Review of Systems:  As per HPI- otherwise negative.   Physical Examination: Vitals:   03/02/17 1337  BP: (!) 142/72  Pulse: 99  Resp: 16  Temp: 98 F (36.7 C)  SpO2: 96%   Vitals:   03/02/17 1337  Weight: 221 lb 9.6 oz (100.5 kg)   Body mass index is 41.87 kg/m. Ideal Body Weight:    GEN: WDWN, NAD, Non-toxic, A & O x 3, obese, otherwise looks well.  Here today with her sister HEENT: Atraumatic, Normocephalic. Neck supple. No masses, No LAD.  Bilateral TM wnl, oropharynx normal.  PEERL,EOMI.   Ears and Nose: No external deformity.   CV: RRR, No M/G/R. No JVD. No thrill. No extra heart sounds. PULM: CTA B, no wheezes, crackles, rhonchi. No retractions. No resp. distress. No accessory muscle use. ABD: S, NT, ND, +BS. No rebound. No HSM. EXTR: No c/c/e NEURO Normal gait, but pt tires easily.  Took WC back down to lobby PSYCH: Normally interactive. Conversant. Not depressed or anxious appearing.  Calm  demeanor.  Wearing O2 via [place] today  Results for orders placed or performed in visit on 03/02/17  POC Influenza A&B (Binax test)  Result Value Ref Range   Influenza A, POC Positive (A) Negative   Influenza B, POC Negative Negative   Dg Chest 2 View  Result Date: 03/02/2017 CLINICAL DATA:  Influenza.  History of left lung cancer. EXAM: CHEST  2 VIEW COMPARISON:  01/02/2017 chest radiograph. FINDINGS: Surgical sutures and clips overlie the left upper lung. Stable cardiomediastinal silhouette with normal heart size. No pneumothorax. No pleural effusion. Hyperinflated lungs. No pulmonary edema. Stable right apical pulmonary nodule and focal medial left upper lung opacity. No acute consolidative airspace disease. IMPRESSION: 1. No acute consolidative airspace disease. 2. Hyperinflated lungs, suggesting COPD. 3. Stable right apical pulmonary nodule and focal medial left upper lung opacity, please see the 08/13/2016 chest CT report for further details and follow-up recommendations. Electronically Signed   By: Ilona Sorrel M.D.   On: 03/02/2017 14:22    Assessment and Plan: Influenza - Plan: oseltamivir (TAMIFLU) 75 MG capsule, doxycycline (VIBRAMYCIN) 100 MG capsule, DG Chest 2 View  Cough - Plan: POC Influenza A&B (Binax test)  Here today with influenza.  She is a rather delicate pt with history of COPD and lung cancer, recurrent Start on tamiflu today Gave rx for doxycycline as well Called pt and LMOM with CXR result.  No pneumonia or bronchitis noted. However, if she wants to take the doxycycline as well that is fine with me. Otherwise, hold onto this and start if not feeling better in the next couple of days   Signed Lamar Blinks, MD

## 2017-03-02 ENCOUNTER — Encounter: Payer: Self-pay | Admitting: Family Medicine

## 2017-03-02 ENCOUNTER — Ambulatory Visit: Payer: Medicare Other | Admitting: Family Medicine

## 2017-03-02 ENCOUNTER — Ambulatory Visit (HOSPITAL_BASED_OUTPATIENT_CLINIC_OR_DEPARTMENT_OTHER)
Admission: RE | Admit: 2017-03-02 | Discharge: 2017-03-02 | Disposition: A | Discharge status: Home / Self Care | Payer: Medicare Other | Source: Ambulatory Visit | Attending: Family Medicine | Admitting: Family Medicine

## 2017-03-02 VITALS — BP 142/72 | HR 99 | Temp 98.0°F | Resp 16 | Wt 221.6 lb

## 2017-03-02 DIAGNOSIS — J111 Influenza due to unidentified influenza virus with other respiratory manifestations: Secondary | ICD-10-CM

## 2017-03-02 DIAGNOSIS — R918 Other nonspecific abnormal finding of lung field: Secondary | ICD-10-CM | POA: Insufficient documentation

## 2017-03-02 DIAGNOSIS — R059 Cough, unspecified: Secondary | ICD-10-CM

## 2017-03-02 DIAGNOSIS — R05 Cough: Secondary | ICD-10-CM | POA: Diagnosis not present

## 2017-03-02 LAB — POC INFLUENZA A&B (BINAX/QUICKVUE)
INFLUENZA B, POC: NEGATIVE
Influenza A, POC: POSITIVE — AB

## 2017-03-02 MED ORDER — OSELTAMIVIR PHOSPHATE 75 MG PO CAPS: 75.0000 mg | ORAL_CAPSULE | Freq: Two times a day (BID) | ORAL | 0 refills | Status: DC

## 2017-03-02 MED ORDER — DOXYCYCLINE HYCLATE 100 MG PO CAPS: 100.0000 mg | ORAL_CAPSULE | Freq: Two times a day (BID) | ORAL | 0 refills | Status: DC

## 2017-03-02 NOTE — Patient Instructions (Signed)
I am sorry that you have the flu!   Go to the ground floor imaging dept to have your chest x-ray. Then you can head to the drug store.  I will let you know if you need the doxycycline- we will have you use the tamiflu Please let me know if you are not getting better in the next couple of days-Sooner if worse.

## 2017-03-03 ENCOUNTER — Telehealth: Payer: Self-pay | Admitting: Pulmonary Disease

## 2017-03-03 DIAGNOSIS — J441 Chronic obstructive pulmonary disease with (acute) exacerbation: Secondary | ICD-10-CM

## 2017-03-03 NOTE — Telephone Encounter (Signed)
Order placed. Advised pt's daughter and nothing further is needed,

## 2017-03-03 NOTE — Telephone Encounter (Signed)
I'm fine with that 

## 2017-03-03 NOTE — Telephone Encounter (Signed)
Patient's niece is calling regarding this order, [place], [phone].

## 2017-03-03 NOTE — Telephone Encounter (Signed)
Incoming call:  [facility]uey Romans Gaspar Bidding [phone]); O2 testing are to old; showing stats from 2017; need stats within the last 30-days; otherwise, the pt will need to come in for an appointment   Above message is from an other phone call about the same thing.   Called [facility] Romans but it is after 5pm so they are not available.   Left message for niece although she is not on patient's DPR.

## 2017-03-03 NOTE — Telephone Encounter (Signed)
Spoke with pt, she states she needs an order for a POC-Inogen. JN can we send to [place]?

## 2017-03-03 NOTE — Telephone Encounter (Signed)
Will close this message as a duplicate message was opened at the same time for the same purpose.

## 2017-03-04 ENCOUNTER — Encounter: Payer: Self-pay | Admitting: Adult Health

## 2017-03-04 ENCOUNTER — Ambulatory Visit: Payer: Medicare Other | Admitting: Adult Health

## 2017-03-04 DIAGNOSIS — J439 Emphysema, unspecified: Secondary | ICD-10-CM

## 2017-03-04 NOTE — Telephone Encounter (Signed)
lmomtcb x 2  

## 2017-03-04 NOTE — Assessment & Plan Note (Signed)
Rescheduled - not seen

## 2017-03-04 NOTE — Patient Instructions (Addendum)
Continue on Symbicort 2 puffs Twice daily   Continue on Oxygen 2l/m with activity and At bedtime

## 2017-03-04 NOTE — Telephone Encounter (Signed)
Pt's niece calling back from yesterday.  She will be at [phone]

## 2017-03-04 NOTE — Progress Notes (Signed)
Pt not seen , rescheduled as here for O2 qualification test  Will reschedule in 2 weeks.

## 2017-03-04 NOTE — Telephone Encounter (Signed)
Spoke with Huey Romans and they stated pt needs an appt because changing from [place] to [place] requires qualifying stats within the last 30 days. I advised her niece and placed her in an appt with TP today to get this done so we can send the oxygen order to [place]. Nothing further is needed.

## 2017-03-08 IMAGING — DX DG THORACIC SPINE 2V
3 series · 3 of 3 positions shown · non-contrast
Comparison: 11/03/2015 chest CT

CLINICAL DATA: Two week history of back pain.

EXAM:
THORACIC SPINE 2 VIEWS

[t-spine ap]
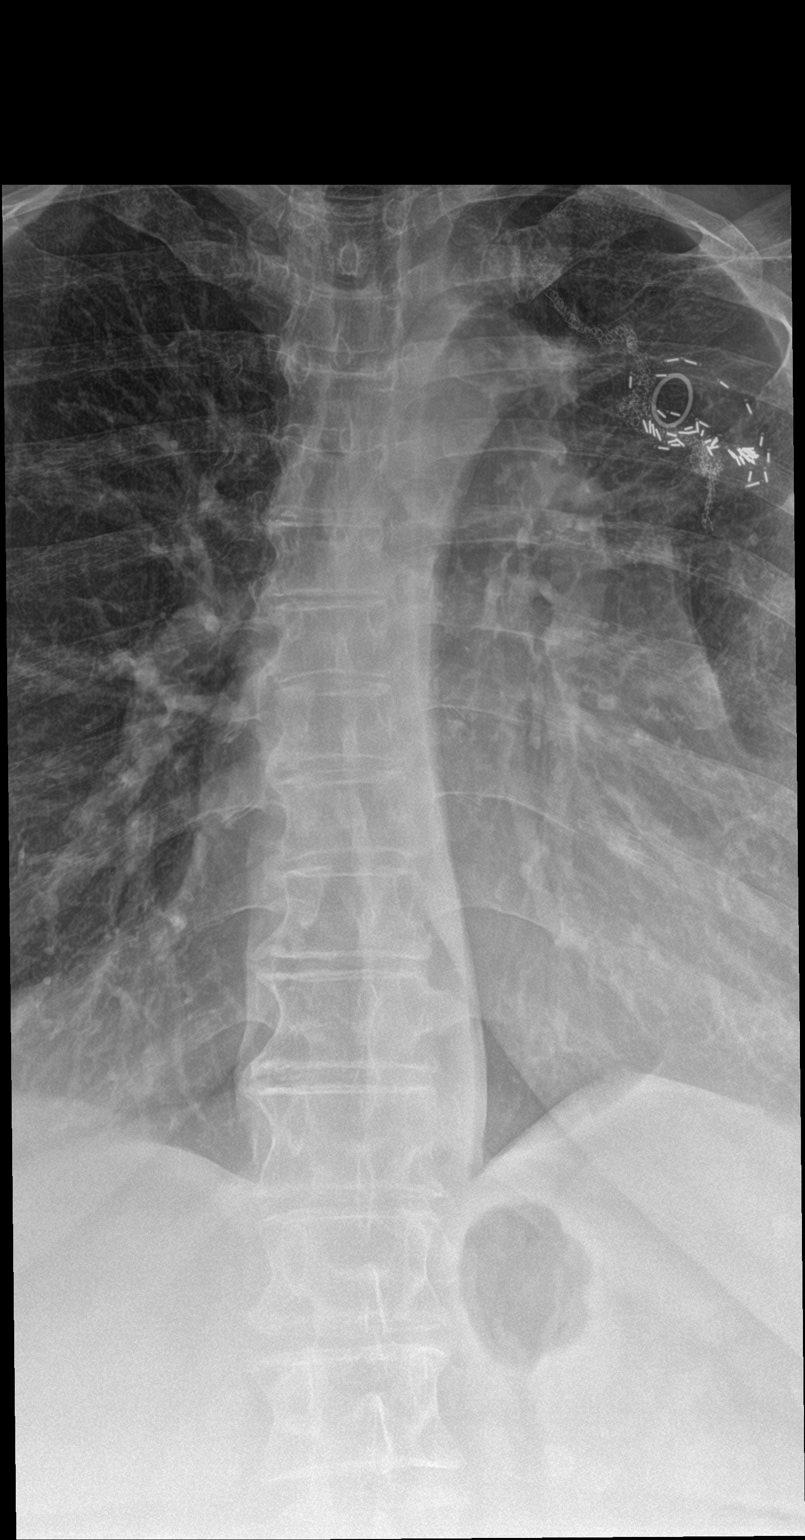

[t-spine lat]
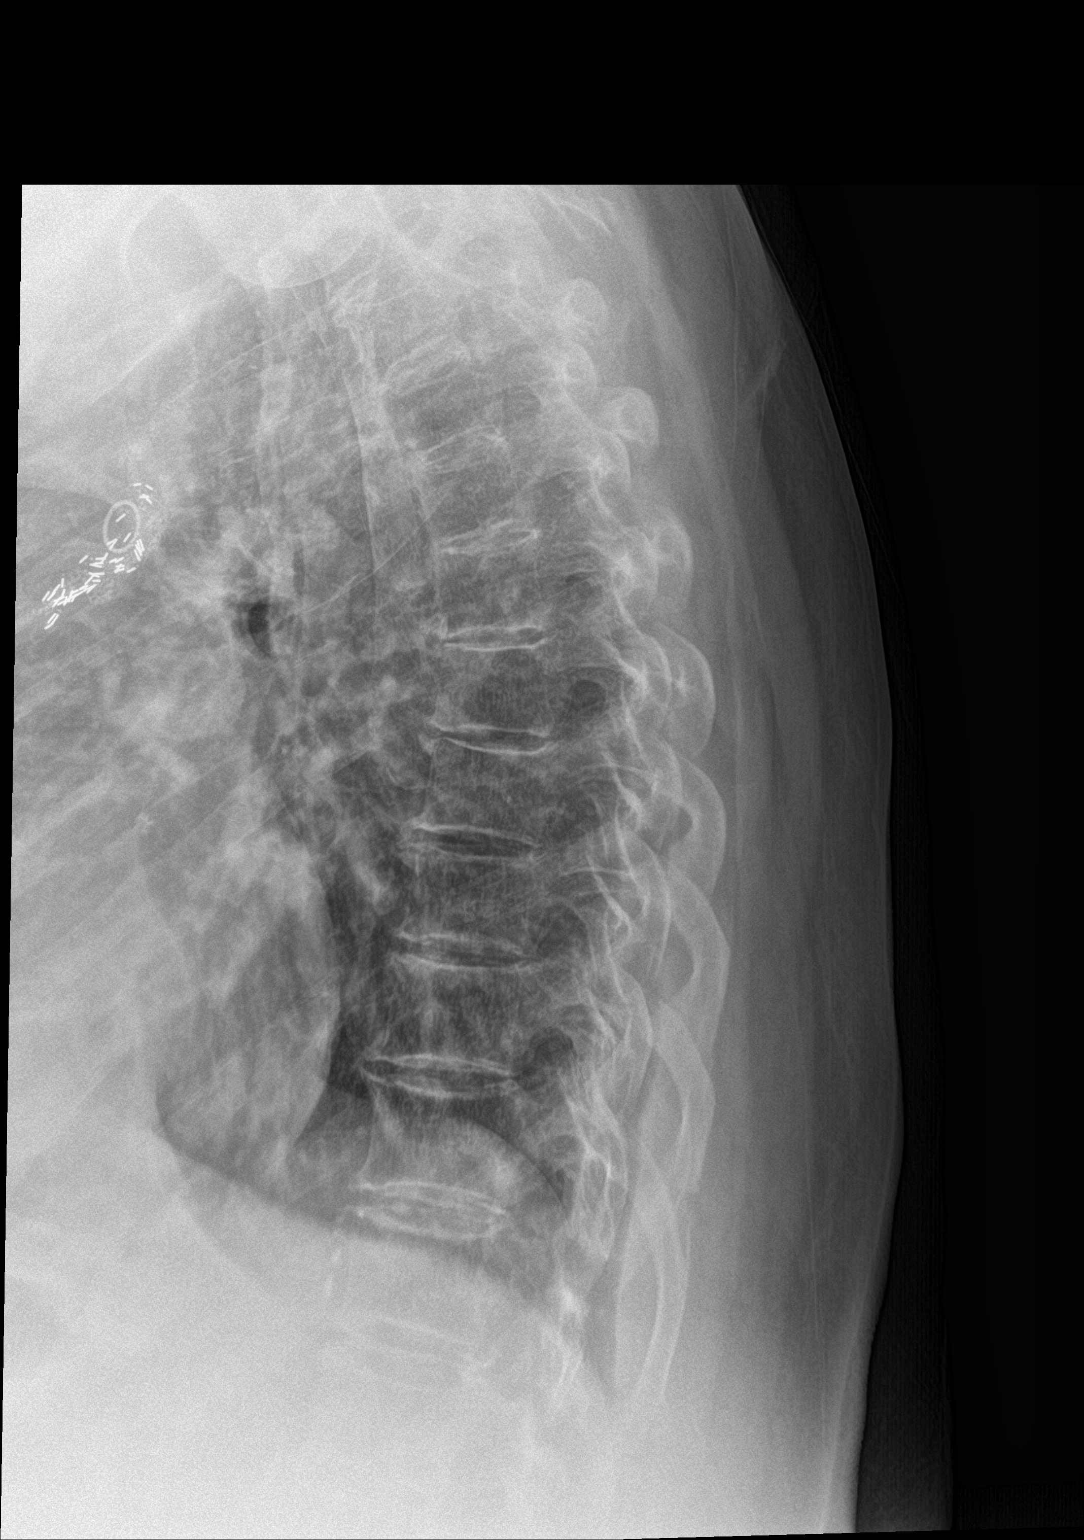

[t-spine swimmers]
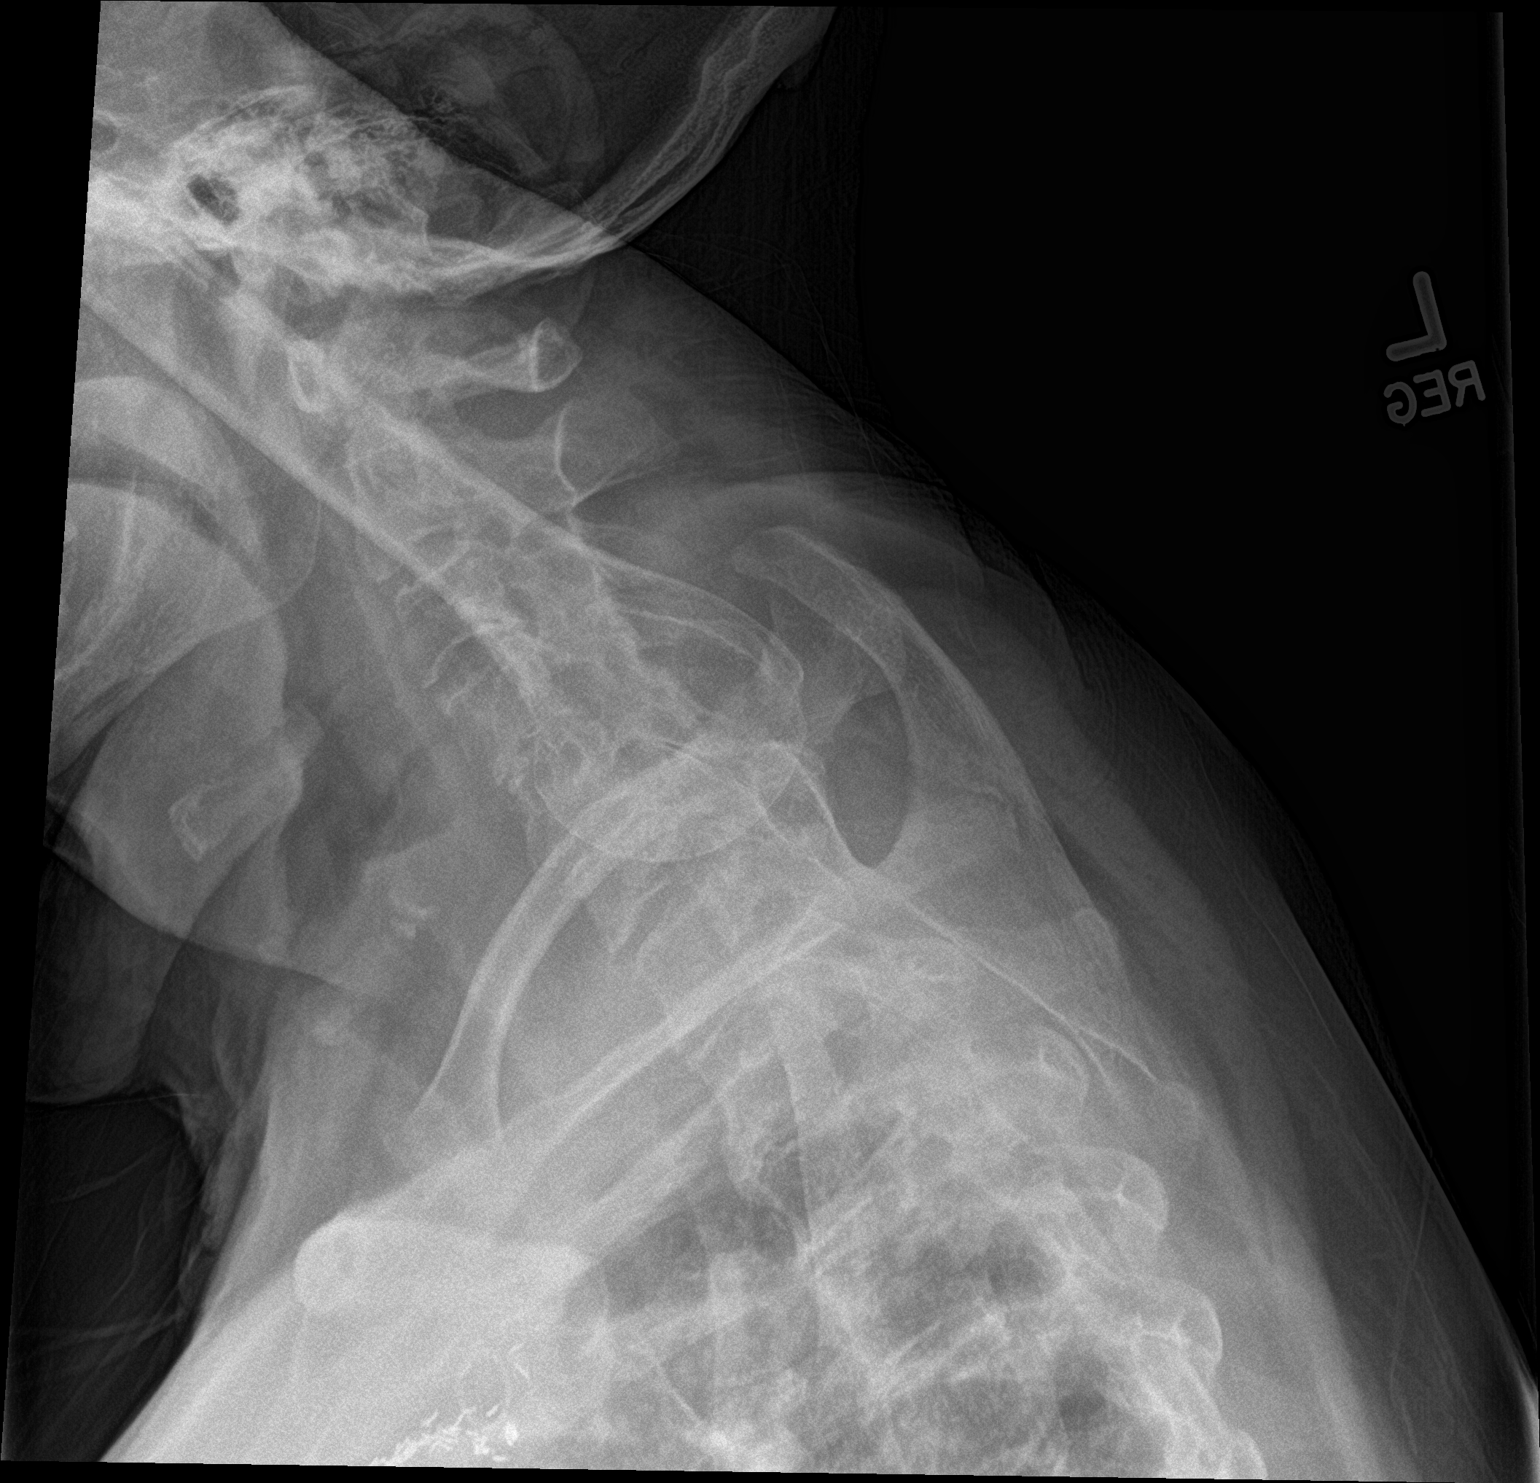

[3 of 3 positions shown; findings below may reference images not displayed]

FINDINGS: No evidence of acute fracture. No traumatic malalignment.
Generalized spondylosis with upper thoracic ankylosis. Osteopenia.
No evidence of focal or aggressive bone lesion. Postoperative
changes to the left upper lobe including radiotherapy seeds.
IMPRESSION: No acute finding.

## 2017-03-13 DIAGNOSIS — R0602 Shortness of breath: Secondary | ICD-10-CM | POA: Diagnosis not present

## 2017-03-13 DIAGNOSIS — Z85118 Personal history of other malignant neoplasm of bronchus and lung: Secondary | ICD-10-CM | POA: Diagnosis not present

## 2017-03-13 DIAGNOSIS — J449 Chronic obstructive pulmonary disease, unspecified: Secondary | ICD-10-CM | POA: Diagnosis not present

## 2017-03-16 DIAGNOSIS — R0602 Shortness of breath: Secondary | ICD-10-CM | POA: Diagnosis not present

## 2017-03-16 DIAGNOSIS — J449 Chronic obstructive pulmonary disease, unspecified: Secondary | ICD-10-CM | POA: Diagnosis not present

## 2017-03-16 DIAGNOSIS — Z85118 Personal history of other malignant neoplasm of bronchus and lung: Secondary | ICD-10-CM | POA: Diagnosis not present

## 2017-03-21 ENCOUNTER — Ambulatory Visit: Payer: Medicare Other | Admitting: Adult Health

## 2017-03-21 ENCOUNTER — Encounter: Payer: Self-pay | Admitting: Adult Health

## 2017-03-21 VITALS — BP 130/82 | HR 90 | Ht 61.0 in | Wt 220.0 lb

## 2017-03-21 DIAGNOSIS — Z23 Encounter for immunization: Secondary | ICD-10-CM | POA: Diagnosis not present

## 2017-03-21 DIAGNOSIS — R918 Other nonspecific abnormal finding of lung field: Secondary | ICD-10-CM | POA: Diagnosis not present

## 2017-03-21 DIAGNOSIS — J439 Emphysema, unspecified: Secondary | ICD-10-CM | POA: Diagnosis not present

## 2017-03-21 DIAGNOSIS — J9611 Chronic respiratory failure with hypoxia: Secondary | ICD-10-CM

## 2017-03-21 NOTE — Patient Instructions (Addendum)
Flu shot today .  Continue on Symbicort 2 puffs Twice daily  , rinse after use.  Mucinex DM Twice daily  As needed  Cough/congestion.  Continue on Oxygen 2l/m .  Order for POC .  Follow up with Dr. Lamonte Sakai  In 2 months and As needed  .  Please contact office for sooner follow up if symptoms do not improve or worsen or seek emergency care

## 2017-03-21 NOTE — Assessment & Plan Note (Signed)
Currently stable. Flu shot today  Plan  Patient Instructions  Flu shot today .  Continue on Symbicort 2 puffs Twice daily  , rinse after use.  Mucinex DM Twice daily  As needed  Cough/congestion.  Continue on Oxygen 2l/m .  Order for POC .  Follow up with Dr. Lamonte Sakai  In 2 months and As needed  .  Please contact office for sooner follow up if symptoms do not improve or worsen or seek emergency care

## 2017-03-21 NOTE — Assessment & Plan Note (Signed)
Cont on o2 .  POC order to DME

## 2017-03-21 NOTE — Progress Notes (Signed)
_0  ID: Brandy Cameron, female    DOB: 04-Apr-1936, 81 y.o.   MRN: [id number]  Chief Complaint  Patient presents with  . Follow-up    COPD     Referring provider: [name], MD  HPI: 81 year old female former smoker followed for moderate to severe COPD, chronic hypoxic respiratory failure on oxygen and bilateral lung nodules presumed cancerous.  TEST    03/21/2017 Follow up : COPD , O2 RF , Lung nodules  Patient presents for a follow-up visit.  Patient has known moderate to severe COPD.  She remains on Symbicort twice daily.  She is tried multiple inhalers and nebulizers in the past.  And feels that that Symbicort works the best for her.  She recently had the flu and bronchitis a few weeks ago.  She was treated with doxycycline and Tamiflu.  She was unable to afford Tamiflu.  She says that symptoms are improving.  She is feeling better.  She denies any fever chest pain orthopnea PND or leg swelling.   Patient is on oxygen 2 L.  O2 saturations 88% on room air at rest.  O2 saturations on room air ambulating 88%.  O2 saturations on 2 L of oxygen pulse is 95%.  Patient wants to get a light weight portable oxygen concentrator device .  She is unable to carry heavy tanks or devices..  Patient has bilateral lung nodules.  These were hypermetabolic on PET scan.  She has been seen by medical and radiation oncology.  She has declined treatment.  Family is with her today in exam room   Allergies  Allergen Reactions  . Augmentin [Amoxicillin-Pot Clavulanate] Diarrhea  . Sulfa Antibiotics Rash and Shortness Of Breath  . Tape Other (See Comments)  . Amlodipine   . Pantoprazole Nausea And Vomiting  . Sulfonamide Derivatives Rash    Immunization History  Administered Date(s) Administered  . Influenza Split 03/01/2015  . Influenza, High Dose Seasonal PF 02/03/2016, 03/21/2017  . Influenza-Unspecified 04/26/2014  . Pneumococcal Conjugate-13 04/26/2012, 01/21/2015  . Pneumococcal  Polysaccharide-23 01/05/2016  . Tdap 02/03/2016    Past Medical History:  Diagnosis Date  . Asthma   . CAD (coronary artery disease)    a. LHC 9/11 (in setting of NSTEMI): LAD irregs, mLCx 50, pRCA 50, inf-apical AK;  b. Myoview 9/13: Normal stress nuclear study.  LV Ejection Fraction: 71%   . Carotid stenosis    a. Carotid US 9/13: bilat 0-39%;  b. Carotid US 7/15: no sig stenosis  . Chronic diastolic CHF (congestive heart failure) ([place])    a. Echo 7/15: Mild LVH, mild focal basal septal hypertrophy, EF 50-55%, no RWMA, Gr 1 DD  . COPD (chronic obstructive pulmonary disease) ([place])   . HTN (hypertension)   . Lung cancer [facility])    [name]  Marland Kitchen NSTEMI (non-ST elevated myocardial infarction) ([place])    with only mild to moderate CAD in September of 2011,There was concern for apival ballooning. >> ? Tako-Tsubo syndrome vs vasospasm    Tobacco History: Social History   Tobacco Use  Smoking Status Former Smoker  . Packs/day: 1.50  . Years: 42.00  . Pack years: 63.00  . Types: Cigarettes  . Last attempt to quit: 04/26/2006  . Years since quitting: 10.9  Smokeless Tobacco Never Used   Counseling given: Not Answered   Outpatient Encounter Medications as of 03/21/2017  Medication Sig  .  albuterol (PROVENTIL HFA;VENTOLIN HFA) 108 (90 Base) MCG/ACT inhaler Inhale 2 puffs into the lungs every 4 (four) hours as needed for wheezing or shortness of breath.  Marland Kitchen albuterol (PROVENTIL) (2.5 MG/3ML) 0.083% nebulizer solution Take 3 mLs (2.5 mg total) by nebulization every 4 (four) hours as needed for wheezing or shortness of breath.  Marland Kitchen aspirin 81 MG tablet Take 81 mg by mouth daily.    Marland Kitchen atorvastatin (LIPITOR) 20 MG tablet TAKE 1 TABLET(20 MG) BY MOUTH DAILY  . atorvastatin (LIPITOR) 20 MG tablet TAKE 1 TABLET(20 MG) BY MOUTH DAILY  . budesonide-formoterol (SYMBICORT) 160-4.5 MCG/ACT inhaler Inhale 2 puffs into the  lungs 2 (two) times daily.  . calcium carbonate 1250 MG capsule Take 1,250 mg by mouth daily.   . Cholecalciferol (VITAMIN D3 PO) Take by mouth daily.  . furosemide (LASIX) 40 MG tablet Take 2 tablets (80 mg total) by mouth daily.  Marland Kitchen losartan (COZAAR) 100 MG tablet TAKE 1 TABLET BY MOUTH DAILY(YEARLY PHYSICAL IS DUE IN JUNE MUST SEE MD FOR REFILLS)  . metoprolol tartrate (LOPRESSOR) 25 MG tablet TAKE 1 TABLET BY MOUTH TWICE DAILY  . Omega-3 Fatty Acids (FISH OIL PO) Take by mouth daily.  . OXYGEN Inhale 2 L into the lungs. 2 liters continuous  . potassium chloride SA (K-DUR,KLOR-CON) 20 MEQ tablet TAKE 1 TABLET BY MOUTH DAILY  . Spacer/Aero-Holding Chambers (AEROCHAMBER MV) inhaler Use as instructed  . Glycopyrrolate (LONHALA MAGNAIR STARTER KIT) 25 MCG/ML SOLN Inhale 25 mcg into the lungs 2 (two) times daily. DX: J44.9 (Patient not taking: Reported on 03/21/2017)  . nitroGLYCERIN (NITROSTAT) 0.4 MG SL tablet Place 1 tablet (0.4 mg total) under the tongue every 5 (five) minutes as needed for chest pain.  . [DISCONTINUED] doxycycline (VIBRAMYCIN) 100 MG capsule Take 1 capsule (100 mg total) 2 (two) times daily by mouth. (Patient not taking: Reported on 03/21/2017)  . [DISCONTINUED] metoprolol tartrate (LOPRESSOR) 25 MG tablet TAKE 1 TABLET BY MOUTH TWICE DAILY  . [DISCONTINUED] metoprolol tartrate (LOPRESSOR) 25 MG tablet TAKE 1 TABLET BY MOUTH TWICE DAILY  . [DISCONTINUED] oseltamivir (TAMIFLU) 75 MG capsule Take 1 capsule (75 mg total) 2 (two) times daily by mouth. (Patient not taking: Reported on 03/21/2017)  . [DISCONTINUED] predniSONE (DELTASONE) 20 MG tablet Take one tab by mouth twice daily for 3 days, then one daily for 3 days. Take with food. (Patient not taking: Reported on 03/21/2017)   No facility-administered encounter medications on file as of 03/21/2017.      Review of Systems  Constitutional:   No  weight loss, night sweats,  Fevers, chills, + fatigue, or  lassitude.  HEENT:    No headaches,  Difficulty swallowing,  Tooth/dental problems, or  Sore throat,                No sneezing, itching, ear ache, nasal congestion, post nasal drip,   CV:  No chest pain,  Orthopnea, PND, swelling in lower extremities, anasarca, dizziness, palpitations, syncope.   GI  No heartburn, indigestion, abdominal pain, nausea, vomiting, diarrhea, change in bowel habits, loss of appetite, bloody stools.   Resp:    No wheezing.  No chest wall deformity  Skin: no rash or lesions.  GU: no dysuria, change in color of urine, no urgency or frequency.  No flank pain, no hematuria   MS:  No joint pain or swelling.  No decreased range of motion.  No back pain.    Physical Exam  BP 130/82 (BP Location: Left  Arm, Cuff Size: Normal)   Pulse 90   Ht _0  (1.549 m)   Wt 220 lb (99.8 kg)   SpO2 97%   BMI 41.57 kg/m   GEN: A/Ox3; pleasant , NAD, eldelry , O2 , wc    HEENT:  [place]/AT,  EACs-clear, TMs-wnl, NOSE-clear, THROAT-clear, no lesions, no postnasal drip or exudate noted.   NECK:  Supple w/ fair ROM; no JVD; normal carotid impulses w/o bruits; no thyromegaly or nodules palpated; no lymphadenopathy.    RESP diminished breath sounds in the bases. no accessory muscle use, no dullness to percussion  CARD:  RRR, no m/r/g, tr  peripheral edema, pulses intact, no cyanosis or clubbing.  GI:   Soft & nt; nml bowel sounds; no organomegaly or masses detected.   Musco: Warm bil, no deformities or joint swelling noted.   Neuro: alert, no focal deficits noted.    Skin: Warm, no lesions or rashes    Lab Results:  CBC   BNP  Imaging: Dg Chest 2 View  Result Date: 03/02/2017 CLINICAL DATA:  Influenza.  History of left lung cancer. EXAM: CHEST  2 VIEW COMPARISON:  01/02/2017 chest radiograph. FINDINGS: Surgical sutures and clips overlie the left upper lung. Stable cardiomediastinal silhouette with normal heart size. No pneumothorax. No pleural effusion. Hyperinflated lungs. No  pulmonary edema. Stable right apical pulmonary nodule and focal medial left upper lung opacity. No acute consolidative airspace disease. IMPRESSION: 1. No acute consolidative airspace disease. 2. Hyperinflated lungs, suggesting COPD. 3. Stable right apical pulmonary nodule and focal medial left upper lung opacity, please see the 08/13/2016 chest CT report for further details and follow-up recommendations. Electronically Signed   By: Ilona Sorrel M.D.   On: 03/02/2017 14:22     Assessment & Plan:   COPD (chronic obstructive pulmonary disease) (HCC) Currently stable. Flu shot today  Plan  Patient Instructions  Flu shot today .  Continue on Symbicort 2 puffs Twice daily  , rinse after use.  Mucinex DM Twice daily  As needed  Cough/congestion.  Continue on Oxygen 2l/m .  Order for POC .  Follow up with Dr. Lamonte Sakai  In 2 months and As needed  .  Please contact office for sooner follow up if symptoms do not improve or worsen or seek emergency care      Chronic respiratory failure ([place]) Cont on o2 .  POC order to DME   Lung nodules Hypermetabolic lung nodules . Pt declines further evaluation and treatment  Recent CXR , stable .      Rexene Edison, NP 03/21/2017

## 2017-03-21 NOTE — Progress Notes (Signed)
Note reviewed.  Sonia Baller Ashok Cordia, M.D. [facility] Pager:  [phone] After 7pm or if no response, call [phone] 7:26 PM 03/21/17

## 2017-03-21 NOTE — Assessment & Plan Note (Signed)
Hypermetabolic lung nodules . Pt declines further evaluation and treatment  Recent CXR , stable .

## 2017-03-23 NOTE — Addendum Note (Signed)
Addended by: Parke Poisson E on: 03/23/2017 05:10 PM   Modules accepted: Orders

## 2017-03-25 ENCOUNTER — Telehealth: Payer: Self-pay | Admitting: Emergency Medicine

## 2017-03-25 NOTE — Telephone Encounter (Signed)
[facility] [employer] but line rang to as the number was no longer in service.  lmtcb for pt. Per the last referral note the referral was cancelled with [facility] because [facility] cannot provide the POC the pt wants, therefore pt is staying with [facility]. Spoke with Keane Scrape and was advised that if pt could not get POC through [facility] then she would keep the O2 system she has now. Will need to confirm this with the pt.

## 2017-03-25 NOTE — Telephone Encounter (Signed)
Patient returning call - she can be reached at [phone] - pr

## 2017-03-25 NOTE — Telephone Encounter (Signed)
Spoke with pt, she would like to get the POC that is less than 4.8 lbs because it is still too heavy for her to carry. She saw a [facility] that would be good for the pt.

## 2017-03-25 NOTE — Telephone Encounter (Signed)
[employer]/Insurance will not pay  We offer traditional consumer financing up to 24 months with approved credit.  Call [phone] Ext [phone] for Details.

## 2017-03-28 NOTE — Telephone Encounter (Signed)
Called spoke with patient, explained to her that we have not been able to find a POC that meets her specifications through a DME.  So her options are to 1- stick with [facility] and her current portable system, or 2- pay out of pocket for a POC of her choosing.  Pt agreed that she has also been able to find a lightweight POC but not available through her DME Pt unable to buy POC so she will keep what she has  Nothing further needed; will sign off

## 2017-04-09 ENCOUNTER — Other Ambulatory Visit: Payer: Self-pay | Admitting: Internal Medicine

## 2017-04-09 DIAGNOSIS — I2510 Atherosclerotic heart disease of native coronary artery without angina pectoris: Secondary | ICD-10-CM

## 2017-04-12 DIAGNOSIS — Z85118 Personal history of other malignant neoplasm of bronchus and lung: Secondary | ICD-10-CM | POA: Diagnosis not present

## 2017-04-12 DIAGNOSIS — J449 Chronic obstructive pulmonary disease, unspecified: Secondary | ICD-10-CM | POA: Diagnosis not present

## 2017-04-12 DIAGNOSIS — R0602 Shortness of breath: Secondary | ICD-10-CM | POA: Diagnosis not present

## 2017-04-15 DIAGNOSIS — R0602 Shortness of breath: Secondary | ICD-10-CM | POA: Diagnosis not present

## 2017-04-15 DIAGNOSIS — Z85118 Personal history of other malignant neoplasm of bronchus and lung: Secondary | ICD-10-CM | POA: Diagnosis not present

## 2017-04-15 DIAGNOSIS — J449 Chronic obstructive pulmonary disease, unspecified: Secondary | ICD-10-CM | POA: Diagnosis not present

## 2017-04-17 DIAGNOSIS — Z882 Allergy status to sulfonamides status: Secondary | ICD-10-CM | POA: Diagnosis not present

## 2017-04-17 DIAGNOSIS — Z880 Allergy status to penicillin: Secondary | ICD-10-CM | POA: Diagnosis not present

## 2017-04-17 DIAGNOSIS — W540XXA Bitten by dog, initial encounter: Secondary | ICD-10-CM | POA: Diagnosis not present

## 2017-04-17 DIAGNOSIS — J449 Chronic obstructive pulmonary disease, unspecified: Secondary | ICD-10-CM | POA: Diagnosis not present

## 2017-04-17 DIAGNOSIS — I10 Essential (primary) hypertension: Secondary | ICD-10-CM | POA: Diagnosis not present

## 2017-04-17 DIAGNOSIS — K219 Gastro-esophageal reflux disease without esophagitis: Secondary | ICD-10-CM | POA: Diagnosis not present

## 2017-04-17 DIAGNOSIS — Z79899 Other long term (current) drug therapy: Secondary | ICD-10-CM | POA: Diagnosis not present

## 2017-04-17 DIAGNOSIS — Z87891 Personal history of nicotine dependence: Secondary | ICD-10-CM | POA: Diagnosis not present

## 2017-04-17 DIAGNOSIS — M152 Bouchard's nodes (with arthropathy): Secondary | ICD-10-CM | POA: Diagnosis not present

## 2017-04-17 DIAGNOSIS — Z7951 Long term (current) use of inhaled steroids: Secondary | ICD-10-CM | POA: Diagnosis not present

## 2017-04-17 DIAGNOSIS — E785 Hyperlipidemia, unspecified: Secondary | ICD-10-CM | POA: Diagnosis not present

## 2017-04-17 DIAGNOSIS — I252 Old myocardial infarction: Secondary | ICD-10-CM | POA: Diagnosis not present

## 2017-04-17 DIAGNOSIS — Z7982 Long term (current) use of aspirin: Secondary | ICD-10-CM | POA: Diagnosis not present

## 2017-04-17 DIAGNOSIS — M7989 Other specified soft tissue disorders: Secondary | ICD-10-CM | POA: Diagnosis not present

## 2017-04-17 DIAGNOSIS — Z888 Allergy status to other drugs, medicaments and biological substances status: Secondary | ICD-10-CM | POA: Diagnosis not present

## 2017-04-17 DIAGNOSIS — M151 Heberden's nodes (with arthropathy): Secondary | ICD-10-CM | POA: Diagnosis not present

## 2017-04-17 DIAGNOSIS — S61451A Open bite of right hand, initial encounter: Secondary | ICD-10-CM | POA: Diagnosis not present

## 2017-04-21 ENCOUNTER — Encounter: Payer: Self-pay | Admitting: Internal Medicine

## 2017-04-21 ENCOUNTER — Ambulatory Visit (INDEPENDENT_AMBULATORY_CARE_PROVIDER_SITE_OTHER): Payer: Medicare Other | Admitting: Internal Medicine

## 2017-04-21 VITALS — BP 154/80 | HR 84 | Temp 97.8°F | Resp 16 | Ht 61.0 in | Wt 220.2 lb

## 2017-04-21 DIAGNOSIS — L089 Local infection of the skin and subcutaneous tissue, unspecified: Secondary | ICD-10-CM | POA: Insufficient documentation

## 2017-04-21 DIAGNOSIS — S61451D Open bite of right hand, subsequent encounter: Secondary | ICD-10-CM | POA: Diagnosis not present

## 2017-04-21 DIAGNOSIS — S61451A Open bite of right hand, initial encounter: Secondary | ICD-10-CM

## 2017-04-21 NOTE — Progress Notes (Signed)
Subjective:  Patient ID: Brandy Cameron, female    DOB: [birth year]  Age: 81 y.o. MRN: [id number]  CC: Hand Injury   HPI Brandy Cameron presents for a recheck of a right hand injury (dog bite) that occurred 4 days ago.  She was seen in the ED and a plain film was normal.  The wound was not closed.  She is taking Augmentin to prevent wound infection.  There is some discomfort and swelling in the area but she has not noticed any redness, streaking, drainage, fever, or chills.  Outpatient Medications Prior to Visit  Medication Sig Dispense Refill  . albuterol (PROVENTIL HFA;VENTOLIN HFA) 108 (90 Base) MCG/ACT inhaler Inhale 2 puffs into the lungs every 4 (four) hours as needed for wheezing or shortness of breath. 1 Inhaler 3  . albuterol (PROVENTIL) (2.5 MG/3ML) 0.083% nebulizer solution Take 3 mLs (2.5 mg total) by nebulization every 4 (four) hours as needed for wheezing or shortness of breath. 120 mL 3  . aspirin 81 MG tablet Take 81 mg by mouth daily.      Marland Kitchen atorvastatin (LIPITOR) 20 MG tablet TAKE 1 TABLET(20 MG) BY MOUTH DAILY 90 tablet 1  . budesonide-formoterol (SYMBICORT) 160-4.5 MCG/ACT inhaler Inhale 2 puffs into the lungs 2 (two) times daily. 1 Inhaler 6  . calcium carbonate 1250 MG capsule Take 1,250 mg by mouth daily.     . Cholecalciferol (VITAMIN D3 PO) Take by mouth daily.    . furosemide (LASIX) 40 MG tablet Take 2 tablets (80 mg total) by mouth daily. 180 tablet 3  . Glycopyrrolate (LONHALA MAGNAIR STARTER KIT) 25 MCG/ML SOLN Inhale 25 mcg into the lungs 2 (two) times daily. DX: J44.9 60 mL 11  . losartan (COZAAR) 100 MG tablet TAKE 1 TABLET BY MOUTH DAILY(YEARLY PHYSICAL IS DUE IN JUNE MUST SEE MD FOR REFILLS) 90 tablet 1  . metoprolol tartrate (LOPRESSOR) 25 MG tablet TAKE 1 TABLET BY MOUTH TWICE DAILY 180 tablet 1  . Omega-3 Fatty Acids (FISH OIL PO) Take by mouth daily.    . OXYGEN Inhale 2 L into the lungs. 2 liters continuous    . potassium chloride SA  (K-DUR,KLOR-CON) 20 MEQ tablet TAKE 1 TABLET BY MOUTH DAILY 90 tablet 1  . Spacer/Aero-Holding Chambers (AEROCHAMBER MV) inhaler Use as instructed 1 each 0  . atorvastatin (LIPITOR) 20 MG tablet TAKE 1 TABLET(20 MG) BY MOUTH DAILY 90 tablet 1  . nitroGLYCERIN (NITROSTAT) 0.4 MG SL tablet Place 1 tablet (0.4 mg total) under the tongue every 5 (five) minutes as needed for chest pain. 25 tablet 12   No facility-administered medications prior to visit.     ROS Review of Systems  Constitutional: Negative for chills and fever.  Skin: Positive for wound. Negative for color change.    Objective:  BP (!) 154/80 (BP Location: Left Arm, Patient Position: Sitting, Cuff Size: Large)   Pulse 84   Temp 97.8 F (36.6 C) (Oral)   Resp 16   Ht 5' 1" (1.549 m)   Wt 220 lb 4 oz (99.9 kg)   SpO2 97%   BMI 41.62 kg/m   BP Readings from Last 3 Encounters:  04/21/17 (!) 154/80  03/21/17 130/82  03/02/17 (!) 142/72    Wt Readings from Last 3 Encounters:  04/21/17 220 lb 4 oz (99.9 kg)  03/21/17 220 lb (99.8 kg)  03/02/17 221 lb 9.6 oz (100.5 kg)    Physical Exam  Musculoskeletal:  Hands:   Lab Results  Component Value Date   WBC 7.7 08/13/2016   HGB 12.9 08/13/2016   HCT 42.6 08/13/2016   PLT 266 08/13/2016   GLUCOSE 111 (H) 11/24/2016   CHOL 135 10/07/2015   TRIG 189.0 (H) 10/07/2015   HDL 43.80 10/07/2015   LDLCALC 53 10/07/2015   ALT 22 08/13/2016   AST 15 08/13/2016   NA 143 11/24/2016   K 3.9 11/24/2016   CL 102 11/24/2016   CREATININE 0.69 11/24/2016   BUN 14 11/24/2016   CO2 31 11/24/2016   TSH 1.41 01/21/2017   INR 1.00 11/22/2014   HGBA1C 5.8 10/07/2015    Dg Chest 2 View  Result Date: 03/02/2017 CLINICAL DATA:  Influenza.  History of left lung cancer. EXAM: CHEST  2 VIEW COMPARISON:  01/02/2017 chest radiograph. FINDINGS: Surgical sutures and clips overlie the left upper lung. Stable cardiomediastinal silhouette with normal heart size. No pneumothorax. No  pleural effusion. Hyperinflated lungs. No pulmonary edema. Stable right apical pulmonary nodule and focal medial left upper lung opacity. No acute consolidative airspace disease. IMPRESSION: 1. No acute consolidative airspace disease. 2. Hyperinflated lungs, suggesting COPD. 3. Stable right apical pulmonary nodule and focal medial left upper lung opacity, please see the 08/13/2016 chest CT report for further details and follow-up recommendations. Electronically Signed   By: Ilona Sorrel M.D.   On: 03/02/2017 14:22    Assessment & Plan:   Adri was seen today for hand injury.  Diagnoses and all orders for this visit:  Bite wound of right hand with infection, subsequent encounter- The wound and surrounding area appears to be healing nicely.  There is no overt evidence of infection at this time but will continue Augmentin to prevent wound infection.  She will elevate the right hand as much as possible and will take over-the-counter remedies for pain control.   I am having Brandy Cameron maintain her aspirin, AEROCHAMBER MV, OXYGEN, calcium carbonate, atorvastatin, albuterol, albuterol, furosemide, nitroGLYCERIN, budesonide-formoterol, losartan, metoprolol tartrate, Omega-3 Fatty Acids (FISH OIL PO), Cholecalciferol (VITAMIN D3 PO), potassium chloride SA, and Glycopyrrolate.  No orders of the defined types were placed in this encounter.    Follow-up: Return in about 5 days (around 04/26/2017).  [name], MD

## 2017-04-21 NOTE — Patient Instructions (Signed)
Wound Infection A wound infection happens when germs start to grow in the wound. Germs that cause wound infections are most commonly bacteria. Other types of infections can occur as well. In some cases, infection can cause the wound to break open. Wound infections need treatment. If a wound infection is left untreated, complications can occur. This may include an infection in your bloodstream (sepsis) or in a bone (osteomyelitis). What are the causes? This condition is caused by germs growing in the wound. What increases the risk? The following factors may make you more likely to develop this condition:  Having a weak body defense system (immune system).  Having diabetes.  Taking steroid medicines for a long time (chronic use).  Smoking.  Older age.  Being overweight.  What are the signs or symptoms? Symptoms of this condition include:  Having more redness, swelling, or pain at the wound site.  Having more blood, pus, or fluid at the wound site.  A bad smell coming from a wound or bandage (dressing).  Having a fever.  Feeling tired or fatigued.  How is this diagnosed? This condition is diagnosed with a medical history and physical exam. You may also have blood tests. How is this treated? This condition is treated with an antibiotic medicine. The infection should improve 24-48 hours after you start antibiotics. Any redness around the wound should stop spreading, and the wound should be less painful. Follow these instructions at home: Medicines  Take or apply over-the-counter and prescription medicines only as told by your health care provider.  If you were prescribed antibiotic medicine, take or apply it as told by your health care provider. Do not stop using the antibiotic even if your condition improves. Wound care  Clean the wound each day or as told by your health care provider. ? Wash the wound with mild soap and water. ? Rinse the wound with water to remove all  soap. ? Pat the wound dry with a clean towel. Do not rub it.  Follow instructions from your health care provider about how to take care of your wound. Make sure you: ? Wash your hands with soap and water before you change your dressing. If soap and water are not available, use hand sanitizer. ? Change your dressing as told by your health care provider. ? Leave stitches (sutures), skin glue, or adhesive strips in place, if this applies. These skin closures may need to stay in place for 2 weeks or longer. If adhesive strip edges start to loosen and curl up, you may trim the loose edges. Do not remove adhesive strips completely unless your health care provider tells you to do that. Some wounds are left open to heal on their own.  Check your wound every day for signs of infection. Watch for: ? More redness, swelling, or pain. ? More fluid or blood. ? Warmth. ? Pus or a bad smell. General instructions  Keep the dressing dry until your health care provider says it can be removed.  Do not take baths, swim, use a hot tub, or do anything that would put your wound underwater until your health care provider approves.  Raise (elevate) the injured area above the level of your heart while you are sitting or lying down.  Do not scratch or pick at the wound.  Keep all follow-up visits as told by your health care provider. This is important. Contact a health care provider if:  Your pain is not controlled with medicine.  You have more   redness, swelling, or pain around your wound.  You have more fluid or blood coming from your wound.  Your wound feels warm to the touch.  You have pus coming from your wound.  You continue to notice a bad smell coming from your wound or your dressing.  Your wound that was closed breaks open. Get help right away if:  You have a red streak going away from your wound.  You have a fever. This information is not intended to replace advice given to you by your  health care provider. Make sure you discuss any questions you have with your health care provider. Document Released: 01/09/2003 Document Revised: 09/24/2015 Document Reviewed: 09/30/2014 Elsevier Interactive Patient Education  2018 [employer]  

## 2017-04-25 ENCOUNTER — Other Ambulatory Visit: Payer: Self-pay | Admitting: Internal Medicine

## 2017-04-25 ENCOUNTER — Telehealth: Payer: Self-pay | Admitting: Internal Medicine

## 2017-04-25 MED ORDER — FLUCONAZOLE 150 MG PO TABS: 150.0000 mg | ORAL_TABLET | Freq: Once | ORAL | 3 refills | Status: AC

## 2017-04-25 NOTE — Telephone Encounter (Signed)
Request for diflucan due to abx rx.

## 2017-04-25 NOTE — Telephone Encounter (Signed)
Copied from [place] [id number]. Topic: Quick Communication - Rx Refill/Question >> Apr 25, 2017  9:50 AM Lennox Solders wrote: Has the patient contacted their pharmacyno (Agent: If no, request that the patient contact the pharmacy for the refill.) pt saw dr Ronnald Ramp on 04-21-17 and was prescribed abx. Pt now has yeast infection and requesting diflucan pills   Preferred Pharmacy (with phone number or street name): [facility] [place] Agent: Please be advised that RX refills may take up to 3 business days. We ask that you follow-up with your pharmacy.

## 2017-04-25 NOTE — Telephone Encounter (Signed)
RX sent

## 2017-04-27 ENCOUNTER — Encounter: Payer: Self-pay | Admitting: Internal Medicine

## 2017-04-27 ENCOUNTER — Other Ambulatory Visit (INDEPENDENT_AMBULATORY_CARE_PROVIDER_SITE_OTHER): Payer: Medicare Other

## 2017-04-27 ENCOUNTER — Ambulatory Visit (INDEPENDENT_AMBULATORY_CARE_PROVIDER_SITE_OTHER): Payer: Medicare Other | Admitting: Internal Medicine

## 2017-04-27 VITALS — BP 150/80 | HR 80 | Temp 97.5°F | Ht 61.0 in | Wt 221.0 lb

## 2017-04-27 DIAGNOSIS — M791 Myalgia, unspecified site: Secondary | ICD-10-CM | POA: Diagnosis not present

## 2017-04-27 DIAGNOSIS — L089 Local infection of the skin and subcutaneous tissue, unspecified: Secondary | ICD-10-CM | POA: Diagnosis not present

## 2017-04-27 DIAGNOSIS — E785 Hyperlipidemia, unspecified: Secondary | ICD-10-CM | POA: Diagnosis not present

## 2017-04-27 DIAGNOSIS — R739 Hyperglycemia, unspecified: Secondary | ICD-10-CM

## 2017-04-27 DIAGNOSIS — R4181 Age-related cognitive decline: Secondary | ICD-10-CM | POA: Diagnosis not present

## 2017-04-27 DIAGNOSIS — I10 Essential (primary) hypertension: Secondary | ICD-10-CM | POA: Diagnosis not present

## 2017-04-27 DIAGNOSIS — R10811 Right upper quadrant abdominal tenderness: Secondary | ICD-10-CM | POA: Diagnosis not present

## 2017-04-27 DIAGNOSIS — S61451D Open bite of right hand, subsequent encounter: Secondary | ICD-10-CM

## 2017-04-27 LAB — CBC WITH DIFFERENTIAL/PLATELET
Basophils Absolute: 0.2 K/uL — ABNORMAL HIGH (ref 0.0–0.1)
Basophils Relative: 1.4 % (ref 0.0–3.0)
Eosinophils Absolute: 0.3 K/uL (ref 0.0–0.7)
Eosinophils Relative: 2.8 % (ref 0.0–5.0)
HCT: 41.3 % (ref 36.0–46.0)
Hemoglobin: 13.1 g/dL (ref 12.0–15.0)
Lymphocytes Relative: 22.1 % (ref 12.0–46.0)
Lymphs Abs: 2.5 K/uL (ref 0.7–4.0)
MCHC: 31.7 g/dL (ref 30.0–36.0)
MCV: 88.8 fl (ref 78.0–100.0)
Monocytes Absolute: 1.1 K/uL — ABNORMAL HIGH (ref 0.1–1.0)
Monocytes Relative: 9.8 % (ref 3.0–12.0)
Neutro Abs: 7.2 K/uL (ref 1.4–7.7)
Neutrophils Relative %: 63.9 % (ref 43.0–77.0)
Platelets: 319 K/uL (ref 150.0–400.0)
RBC: 4.65 Mil/uL (ref 3.87–5.11)
RDW: 14.2 % (ref 11.5–15.5)
WBC: 11.2 K/uL — ABNORMAL HIGH (ref 4.0–10.5)

## 2017-04-27 LAB — LIPASE: Lipase: 46 U/L (ref 11.0–59.0)

## 2017-04-27 LAB — LIPID PANEL
Cholesterol: 122 mg/dL (ref 0–200)
HDL: 39.9 mg/dL (ref >39.00)
NonHDL: 81.65
Total CHOL/HDL Ratio: 3
Triglycerides: 306 mg/dL — ABNORMAL HIGH (ref 0.0–149.0)
VLDL: 61.2 mg/dL — ABNORMAL HIGH (ref 0.0–40.0)

## 2017-04-27 LAB — COMPREHENSIVE METABOLIC PANEL WITH GFR
ALT: 16 U/L (ref 0–35)
AST: 13 U/L (ref 0–37)
Albumin: 4.1 g/dL (ref 3.5–5.2)
Alkaline Phosphatase: 123 U/L — ABNORMAL HIGH (ref 39–117)
BUN: 17 mg/dL (ref 6–23)
CO2: 42 meq/L — ABNORMAL HIGH (ref 19–32)
Calcium: 9.5 mg/dL (ref 8.4–10.5)
Chloride: 97 meq/L (ref 96–112)
Creatinine, Ser: 0.99 mg/dL (ref 0.40–1.20)
GFR: 57.38 mL/min — ABNORMAL LOW
Glucose, Bld: 115 mg/dL — ABNORMAL HIGH (ref 70–99)
Potassium: 4.4 meq/L (ref 3.5–5.1)
Sodium: 145 meq/L (ref 135–145)
Total Bilirubin: 0.3 mg/dL (ref 0.2–1.2)
Total Protein: 6.8 g/dL (ref 6.0–8.3)

## 2017-04-27 LAB — URINALYSIS, ROUTINE W REFLEX MICROSCOPIC
Bilirubin Urine: NEGATIVE
Ketones, ur: NEGATIVE
Leukocytes, UA: NEGATIVE
Nitrite: NEGATIVE
Specific Gravity, Urine: 1.01
Total Protein, Urine: NEGATIVE
Urine Glucose: NEGATIVE
Urobilinogen, UA: 0.2
pH: 7 (ref 5.0–8.0)

## 2017-04-27 LAB — SEDIMENTATION RATE: SED RATE: 52 mm/h — AB (ref 0–30)

## 2017-04-27 LAB — AMYLASE: Amylase: 29 U/L (ref 27–131)

## 2017-04-27 LAB — LDL CHOLESTEROL, DIRECT: Direct LDL: 55 mg/dL

## 2017-04-27 LAB — CK: CK TOTAL: 41 U/L (ref 7–177)

## 2017-04-27 LAB — HEMOGLOBIN A1C: Hgb A1c MFr Bld: 6 % (ref 4.6–6.5)

## 2017-04-27 NOTE — Patient Instructions (Signed)

## 2017-04-27 NOTE — Progress Notes (Signed)
Subjective:  Patient ID: Brandy Cameron, female    DOB: Nov 25, 1935  Age: 82 y.o. MRN: [id number]  CC: Hyperlipidemia; Hypertension; and Wound Check   HPI Brandy Cameron presents for f/up -she is happy to report that the wound from the dog bite on the dorsum of her right hand is healing.  There is no redness, pain, or swelling.  She has noticed a scab formation.  Today she complains of a several month history of muscle cramps all over her body.  The cramps occur during the day and night and are not exacerbated by activity, movement or position.  She also complains of declining memory and wants to see a neurologist to have her memory tested to see if there is anything that she can take to improve her memory.  Outpatient Medications Prior to Visit  Medication Sig Dispense Refill  . albuterol (PROVENTIL) (2.5 MG/3ML) 0.083% nebulizer solution Take 3 mLs (2.5 mg total) by nebulization every 4 (four) hours as needed for wheezing or shortness of breath. 120 mL 3  . aspirin 81 MG tablet Take 81 mg by mouth daily.      . budesonide-formoterol (SYMBICORT) 160-4.5 MCG/ACT inhaler Inhale 2 puffs into the lungs 2 (two) times daily. 1 Inhaler 6  . calcium carbonate 1250 MG capsule Take 1,250 mg by mouth daily.     . Cholecalciferol (VITAMIN D3 PO) Take by mouth daily.    . furosemide (LASIX) 40 MG tablet Take 2 tablets (80 mg total) by mouth daily. 180 tablet 3  . Glycopyrrolate (LONHALA MAGNAIR STARTER KIT) 25 MCG/ML SOLN Inhale 25 mcg into the lungs 2 (two) times daily. DX: J44.9 60 mL 11  . losartan (COZAAR) 100 MG tablet TAKE 1 TABLET BY MOUTH DAILY(YEARLY PHYSICAL IS DUE IN JUNE MUST SEE MD FOR REFILLS) 90 tablet 1  . metoprolol tartrate (LOPRESSOR) 25 MG tablet TAKE 1 TABLET BY MOUTH TWICE DAILY 180 tablet 1  . Omega-3 Fatty Acids (FISH OIL PO) Take by mouth daily.    . OXYGEN Inhale 2 L into the lungs. 2 liters continuous    . potassium chloride SA (K-DUR,KLOR-CON) 20 MEQ tablet TAKE 1  TABLET BY MOUTH DAILY 90 tablet 1  . Spacer/Aero-Holding Chambers (AEROCHAMBER MV) inhaler Use as instructed 1 each 0  . atorvastatin (LIPITOR) 20 MG tablet TAKE 1 TABLET(20 MG) BY MOUTH DAILY 90 tablet 1  . nitroGLYCERIN (NITROSTAT) 0.4 MG SL tablet Place 1 tablet (0.4 mg total) under the tongue every 5 (five) minutes as needed for chest pain. 25 tablet 12  . albuterol (PROVENTIL HFA;VENTOLIN HFA) 108 (90 Base) MCG/ACT inhaler Inhale 2 puffs into the lungs every 4 (four) hours as needed for wheezing or shortness of breath. 1 Inhaler 3   No facility-administered medications prior to visit.     ROS Review of Systems  Constitutional: Negative for chills, diaphoresis, fatigue and fever.  HENT: Negative.   Eyes: Negative.  Negative for visual disturbance.  Respiratory: Negative for cough, chest tightness, shortness of breath and wheezing.   Gastrointestinal: Negative for abdominal pain, constipation, diarrhea, nausea and vomiting.  Endocrine: Negative.   Genitourinary: Negative.  Negative for difficulty urinating.  Musculoskeletal: Positive for myalgias. Negative for arthralgias, back pain and neck pain.  Skin: Positive for wound. Negative for rash.  Allergic/Immunologic: Negative.   Neurological: Negative.  Negative for dizziness.  Hematological: Negative for adenopathy. Does not bruise/bleed easily.  Psychiatric/Behavioral: Positive for decreased concentration. Negative for confusion. The patient is not nervous/anxious.  Objective:  BP (!) 150/80 (BP Location: Left Arm, Patient Position: Sitting, Cuff Size: Large)   Pulse 80   Temp (!) 97.5 F (36.4 C) (Oral)   Ht '5\' 1"'$  (1.549 m)   Wt 221 lb (100.2 kg)   SpO2 97%   BMI 41.76 kg/m   BP Readings from Last 3 Encounters:  04/27/17 (!) 150/80  04/21/17 (!) 154/80  03/21/17 130/82    Wt Readings from Last 3 Encounters:  04/27/17 221 lb (100.2 kg)  04/21/17 220 lb 4 oz (99.9 kg)  03/21/17 220 lb (99.8 kg)    Physical Exam    Constitutional: She is oriented to person, place, and time. No distress.  HENT:  Mouth/Throat: Oropharynx is clear and moist. No oropharyngeal exudate.  Eyes: Conjunctivae are normal. Left eye exhibits no discharge. No scleral icterus.  Neck: Normal range of motion. Neck supple. No JVD present. No thyromegaly present.  Cardiovascular: Normal rate, regular rhythm and normal heart sounds.  No murmur heard. Pulmonary/Chest: Effort normal and breath sounds normal. No respiratory distress. She has no wheezes. She has no rales.  Abdominal: Soft. Bowel sounds are normal. She exhibits no distension and no mass. There is no tenderness. There is no guarding.  Musculoskeletal: Normal range of motion. She exhibits no edema, tenderness or deformity.       Hands: Lymphadenopathy:    She has no cervical adenopathy.  Neurological: She is alert and oriented to person, place, and time.  Skin: Skin is warm and dry. No rash noted. She is not diaphoretic. No erythema. No pallor.  Psychiatric: She has a normal mood and affect. Her behavior is normal. Judgment and thought content normal.  Vitals reviewed.   Lab Results  Component Value Date   WBC 11.2 (H) 04/27/2017   HGB 13.1 04/27/2017   HCT 41.3 04/27/2017   PLT 319.0 04/27/2017   GLUCOSE 115 (H) 04/27/2017   CHOL 122 04/27/2017   TRIG 306.0 (H) 04/27/2017   HDL 39.90 04/27/2017   LDLDIRECT 55.0 04/27/2017   LDLCALC 53 10/07/2015   ALT 16 04/27/2017   AST 13 04/27/2017   NA 145 04/27/2017   K 4.4 04/27/2017   CL 97 04/27/2017   CREATININE 0.99 04/27/2017   BUN 17 04/27/2017   CO2 42 (H) 04/27/2017   TSH 2.74 04/27/2017   INR 1.00 11/22/2014   HGBA1C 6.0 04/27/2017    Dg Chest 2 View  Result Date: 03/02/2017 CLINICAL DATA:  Influenza.  History of left lung cancer. EXAM: CHEST  2 VIEW COMPARISON:  01/02/2017 chest radiograph. FINDINGS: Surgical sutures and clips overlie the left upper lung. Stable cardiomediastinal silhouette with normal  heart size. No pneumothorax. No pleural effusion. Hyperinflated lungs. No pulmonary edema. Stable right apical pulmonary nodule and focal medial left upper lung opacity. No acute consolidative airspace disease. IMPRESSION: 1. No acute consolidative airspace disease. 2. Hyperinflated lungs, suggesting COPD. 3. Stable right apical pulmonary nodule and focal medial left upper lung opacity, please see the 08/13/2016 chest CT report for further details and follow-up recommendations. Electronically Signed   By: Ilona Sorrel M.D.   On: 03/02/2017 14:22    Assessment & Plan:   Keeley was seen today for hyperlipidemia, hypertension and wound check.  Diagnoses and all orders for this visit:  Essential hypertension- Her blood pressure is well controlled.  Electrolytes and renal function are normal. -     Comprehensive metabolic panel; Future -     CBC with Differential/Platelet; Future  Hyperglycemia- Her A1c  is at 6.0%.  She is prediabetic.  Medical therapy is not indicated. -     Comprehensive metabolic panel; Future -     Hemoglobin A1c; Future  Hyperlipidemia with target LDL less than 70- She has achieved her LDL goal but due to the muscle aches I have asked her to stop taking the statin. -     Lipid panel; Future -     CK; Future -     Thyroid Panel With TSH; Future  Bite wound of right hand with infection, subsequent encounter- The infection has resolved and this is healing nicely.  Myalgia- I am concerned she is having a side effect from the statin so I have asked her to stop taking atorvastatin.  Her lab work today is negative for any evidence of myositis or myopathy. -     CK; Future -     Sedimentation rate; Future  Age-related cognitive decline -     Ambulatory referral to Neurology   I have discontinued Lyndsey M. Hare's atorvastatin. I am also having her maintain her aspirin, AEROCHAMBER MV, OXYGEN, calcium carbonate, albuterol, furosemide, nitroGLYCERIN, budesonide-formoterol,  losartan, metoprolol tartrate, Omega-3 Fatty Acids (FISH OIL PO), Cholecalciferol (VITAMIN D3 PO), potassium chloride SA, and Glycopyrrolate.  No orders of the defined types were placed in this encounter.    Follow-up: Return in about 2 months (around 06/25/2017).  [name], MD

## 2017-04-28 ENCOUNTER — Encounter: Payer: Self-pay | Admitting: Internal Medicine

## 2017-04-28 ENCOUNTER — Encounter: Payer: Self-pay | Admitting: Neurology

## 2017-04-28 LAB — THYROID PANEL WITH TSH
FREE THYROXINE INDEX: 2.3 (ref 1.4–3.8)
T3 Uptake: 26 % (ref 22–35)
T4, Total: 8.9 ug/dL (ref 5.1–11.9)
TSH: 2.74 mIU/L (ref 0.40–4.50)

## 2017-04-28 NOTE — Telephone Encounter (Signed)
Pt has been informed.

## 2017-05-11 ENCOUNTER — Encounter: Payer: Self-pay | Admitting: Family Medicine

## 2017-05-11 ENCOUNTER — Ambulatory Visit: Payer: Medicare Other | Admitting: Family Medicine

## 2017-05-11 ENCOUNTER — Ambulatory Visit (INDEPENDENT_AMBULATORY_CARE_PROVIDER_SITE_OTHER): Payer: Medicare Other

## 2017-05-11 VITALS — BP 134/81 | HR 98 | Wt 218.0 lb

## 2017-05-11 DIAGNOSIS — M19012 Primary osteoarthritis, left shoulder: Secondary | ICD-10-CM | POA: Diagnosis not present

## 2017-05-11 DIAGNOSIS — M25512 Pain in left shoulder: Secondary | ICD-10-CM | POA: Diagnosis not present

## 2017-05-11 DIAGNOSIS — G8929 Other chronic pain: Secondary | ICD-10-CM | POA: Diagnosis not present

## 2017-05-11 NOTE — Progress Notes (Signed)
Brandy Cameron is a 82 y.o. female who presents to [facility] today for left shoulder pain.  Brandy Cameron notes pain at the left shoulder.  This has been ongoing for years but worsening over the last month or so.  She denies any injury or radiating pain.  She notes the pain is present with reaching back and reaching up and at night.  She is tried some over-the-counter medicines for pain which have helped a little.  She has not tried any exercises nor had much workup for this recently.   Past Medical History:  Diagnosis Date  . Asthma   . CAD (coronary artery disease)    a. LHC 9/11 (in setting of NSTEMI): LAD irregs, mLCx 50, pRCA 50, inf-apical AK;  b. Myoview 9/13: Normal stress nuclear study.  LV Ejection Fraction: 71%   . Carotid stenosis    a. Carotid US 9/13: bilat 0-39%;  b. Carotid US 7/15: no sig stenosis  . Chronic diastolic CHF (congestive heart failure) ([place])    a. Echo 7/15: Mild LVH, mild focal basal septal hypertrophy, EF 50-55%, no RWMA, Gr 1 DD  . COPD (chronic obstructive pulmonary disease) ([place])   . HTN (hypertension)   . Lung cancer [facility])    [name]  Marland Kitchen NSTEMI (non-ST elevated myocardial infarction) ([place])    with only mild to moderate CAD in September of 2011,There was concern for apival ballooning. >> ? Tako-Tsubo syndrome vs vasospasm   Past Surgical History:  Procedure Laterality Date  . CATARACT SURGERY    . FOOT SURGERY    . GALLBLADDER SURGERY    . KNEE SURGERY    . LUNG LOBECTOMY    . VESICOVAGINAL FISTULA CLOSURE W/ TAH     Social History   Tobacco Use  . Smoking status: Former Smoker    Packs/day: 1.50    Years: 42.00    Pack years: 63.00    Types: Cigarettes    Last attempt to quit: 04/26/2006    Years since quitting: 11.0  . Smokeless tobacco: Never Used  Substance Use Topics  . Alcohol use: No    Alcohol/week:  0.0 oz     ROS:  As above   Medications: Current Outpatient Medications  Medication Sig Dispense Refill  . albuterol (PROVENTIL) (2.5 MG/3ML) 0.083% nebulizer solution Take 3 mLs (2.5 mg total) by nebulization every 4 (four) hours as needed for wheezing or shortness of breath. 120 mL 3  . aspirin 81 MG tablet Take 81 mg by mouth daily.      . budesonide-formoterol (SYMBICORT) 160-4.5 MCG/ACT inhaler Inhale 2 puffs into the lungs 2 (two) times daily. 1 Inhaler 6  . calcium carbonate 1250 MG capsule Take 1,250 mg by mouth daily.     . Cholecalciferol (VITAMIN D3 PO) Take by mouth daily.    . furosemide (LASIX) 40 MG tablet Take 2 tablets (80 mg total) by mouth daily. 180 tablet 3  . Glycopyrrolate (LONHALA MAGNAIR STARTER KIT) 25 MCG/ML SOLN Inhale 25 mcg into the lungs 2 (two) times daily. DX: J44.9 60 mL 11  . losartan (COZAAR) 100 MG tablet TAKE 1 TABLET BY MOUTH DAILY(YEARLY PHYSICAL IS DUE IN JUNE MUST SEE MD FOR REFILLS) 90 tablet 1  . metoprolol tartrate (LOPRESSOR) 25 MG tablet TAKE 1 TABLET BY MOUTH TWICE DAILY 180 tablet 1  . Omega-3 Fatty Acids (FISH OIL  PO) Take by mouth daily.    . OXYGEN Inhale 2 L into the lungs. 2 liters continuous    . potassium chloride SA (K-DUR,KLOR-CON) 20 MEQ tablet TAKE 1 TABLET BY MOUTH DAILY 90 tablet 1  . Spacer/Aero-Holding Chambers (AEROCHAMBER MV) inhaler Use as instructed 1 each 0  . nitroGLYCERIN (NITROSTAT) 0.4 MG SL tablet Place 1 tablet (0.4 mg total) under the tongue every 5 (five) minutes as needed for chest pain. 25 tablet 12   No current facility-administered medications for this visit.    Allergies  Allergen Reactions  . Augmentin [Amoxicillin-Pot Clavulanate] Diarrhea  . Sulfa Antibiotics Rash and Shortness Of Breath  . Tape Other (See Comments)  . Amlodipine   . Pantoprazole Nausea And Vomiting  . Sulfonamide Derivatives Rash     Exam:  BP 134/81   Pulse 98   Wt 218 lb (98.9 kg)   BMI 41.19 kg/m  General: Well  Developed, well nourished, and in no acute distress.  Neuro/Psych: Alert and oriented x3, extra-ocular muscles intact, able to move all 4 extremities, sensation grossly intact. Skin: Warm and dry, no rashes noted.  Respiratory: Not using accessory muscles, speaking in full sentences, trachea midline.  Uses oxygen Cardiovascular: Pulses palpable, no extremity edema. Abdomen: Does not appear distended. MSK:  Left shoulder: Normal-appearing Nontender. Range of motion limited Abduction 100 degrees active and passive. External rotation 30 degrees be on the neutral position active and passive Internal rotation limited to the iliac crest. Positive Hawkins and Neer's test Decreased strength to abduction and external rotation limited by pain Pulses capillary refill and sensation are intact distal upper extremities.  Procedure: Real-time Ultrasound Guided Injection of left subacromial bursa Device: GE Logiq E  Images permanently stored and available for review in the ultrasound unit. Verbal informed consent obtained. Discussed risks and benefits of procedure. Warned about infection bleeding damage to structures skin hypopigmentation and fat atrophy among others. Patient expresses understanding and agreement Time-out conducted.  Noted no overlying erythema, induration, or other signs of local infection.  Skin prepped in a sterile fashion.  Local anesthesia: Topical Ethyl chloride.  With sterile technique and under real time ultrasound guidance:40 mg of triamcinolone and 2 mL of Marcaine injected easily.  Completed without difficulty  Pain did not resolve despite accurate placement of medication confirmed by ultrasound   Advised to call if fevers/chills, erythema, induration, drainage, or persistent bleeding.  Images permanently stored and available for review in the ultrasound unit.  Impression: Technically successful ultrasound guided injection.   X-ray left shoulder shows DJD of  the acromioclavicular joint but no severe DJD of the glenohumeral joint.  No acute changes seen otherwise. Awaiting formal radiology review   No results found for this or any previous visit (from the past 48 hour(s)). No results found.    Assessment and Plan: 82 y.o. female with left shoulder pain.  Based on limited range of motion on physical exam and lack of response with subacromial bursa injection I am suspect the cause of the pain is adhesive capsulitis.  Plan for trial of home exercise program and will recheck in about a month.  If no benefit will proceed with a glenohumeral injection is this will help with the diagnosis as well.    Orders Placed This Encounter  Procedures  . DG Shoulder Left    Standing Status:   Future    Number of Occurrences:   1    Standing Expiration Date:   07/10/2018    Order  Specific Question:   Reason for Exam (SYMPTOM  OR DIAGNOSIS REQUIRED)    Answer:   eval pain left shoulder    Order Specific Question:   Preferred imaging location?    Answer:   [facility]    Order Specific Question:   Radiology Contrast Protocol - do NOT remove file path    Answer:   [email or web address]   No orders of the defined types were placed in this encounter.   Discussed warning signs or symptoms. Please see discharge instructions. Patient expresses understanding.

## 2017-05-11 NOTE — Patient Instructions (Signed)
Thank you for coming in today. Recheck in 4 weeks.  Return sooner if needed.  Call or go to the ER if you develop a large red swollen joint with extreme pain or oozing puss.    Adhesive Capsulitis Adhesive capsulitis is inflammation of the tendons and ligaments that surround the shoulder joint (shoulder capsule). This condition causes the shoulder to become stiff and painful to move. Adhesive capsulitis is also called frozen shoulder. What are the causes? This condition may be caused by:  An injury to the shoulder joint.  Straining the shoulder.  Not moving the shoulder for a period of time. This can happen if your arm was injured or in a sling.  Long-standing health problems, such as: ? Diabetes. ? Thyroid problems. ? Heart disease. ? Stroke. ? Rheumatoid arthritis. ? Lung disease.  In some cases, the cause may not be known. What increases the risk? This condition is more likely to develop in:  Women.  People who are older than 80 years of age.  What are the signs or symptoms? Symptoms of this condition include:  Pain in the shoulder when moving the arm. There may also be pain when parts of the shoulder are touched. The pain is worse at night or when at rest.  Soreness or aching in the shoulder.  Inability to move the shoulder normally.  Muscle spasms.  How is this diagnosed? This condition is diagnosed with a physical exam and imaging tests, such as an X-ray or MRI. How is this treated? This condition may be treated with:  Treatment of the underlying cause or condition.  Physical therapy. This involves performing exercises to get the shoulder moving again.  Medicine. Medicine may be given to relieve pain, inflammation, or muscle spasms.  Steroid injections into the shoulder joint.  Shoulder manipulation. This is a procedure to move the shoulder into another position. It is done after you are given a medicine to make you fall asleep (general anesthetic). The  joint may also be injected with salt water at high pressure to break down scarring.  Surgery. This may be done in severe cases when other treatments have failed.  Although most people recover completely from adhesive capsulitis, some may not regain the full movement of the shoulder. Follow these instructions at home:  Take over-the-counter and prescription medicines only as told by your health care provider.  If you are being treated with physical therapy, follow instructions from your physical therapist.  Avoid exercises that put a lot of demand on your shoulder, such as throwing. These exercises can make pain worse.  If directed, apply ice to the injured area: ? Put ice in a plastic bag. ? Place a towel between your skin and the bag. ? Leave the ice on for 20 minutes, 2-3 times per day. Contact a health care provider if:  You develop new symptoms.  Your symptoms get worse. This information is not intended to replace advice given to you by your health care provider. Make sure you discuss any questions you have with your health care provider. Document Released: 02/07/2009 Document Revised: 09/18/2015 Document Reviewed: 08/05/2014 Elsevier Interactive Patient Education  [employer].

## 2017-05-13 DIAGNOSIS — J449 Chronic obstructive pulmonary disease, unspecified: Secondary | ICD-10-CM | POA: Diagnosis not present

## 2017-05-13 DIAGNOSIS — R0602 Shortness of breath: Secondary | ICD-10-CM | POA: Diagnosis not present

## 2017-05-13 DIAGNOSIS — Z85118 Personal history of other malignant neoplasm of bronchus and lung: Secondary | ICD-10-CM | POA: Diagnosis not present

## 2017-05-16 DIAGNOSIS — J449 Chronic obstructive pulmonary disease, unspecified: Secondary | ICD-10-CM | POA: Diagnosis not present

## 2017-05-16 DIAGNOSIS — R0602 Shortness of breath: Secondary | ICD-10-CM | POA: Diagnosis not present

## 2017-05-16 DIAGNOSIS — Z85118 Personal history of other malignant neoplasm of bronchus and lung: Secondary | ICD-10-CM | POA: Diagnosis not present

## 2017-05-20 ENCOUNTER — Other Ambulatory Visit: Payer: Self-pay | Admitting: Internal Medicine

## 2017-05-20 DIAGNOSIS — I10 Essential (primary) hypertension: Secondary | ICD-10-CM

## 2017-05-23 ENCOUNTER — Ambulatory Visit: Payer: Medicare Other | Admitting: Emergency Medicine

## 2017-05-23 ENCOUNTER — Encounter: Payer: Self-pay | Admitting: Emergency Medicine

## 2017-05-23 DIAGNOSIS — R918 Other nonspecific abnormal finding of lung field: Secondary | ICD-10-CM

## 2017-05-23 DIAGNOSIS — J439 Emphysema, unspecified: Secondary | ICD-10-CM | POA: Diagnosis not present

## 2017-05-23 DIAGNOSIS — C3412 Malignant neoplasm of upper lobe, left bronchus or lung: Secondary | ICD-10-CM

## 2017-05-23 NOTE — Progress Notes (Signed)
Subjective:    Patient ID: Brandy Cameron, female    DOB: 08/23/1935, 82 y.o.   MRN: [id number]  HPI 82 year old woman, former smoker (65 pack years), history coronary disease, hypertension with diastolic CHF, COPD. She had LUL wedge resection (gerhardt) for adenoCA.  Subsequently followed for hypermetabolic pulmonary nodules that we have followed by serial CT scans. Presumed malignancy. Dr Ashok Cordia recommended against bx or rx at this time. She is following serial CT scans, planning to follow with Dr Julien Nordmann. ? Whether she will be a candidate for targeted therapy someday. She is managed on symbicort, albuterol prn. She reports that she has been having HA's, appear to be quick to resolve.    Review of Systems Past Medical History:  Diagnosis Date  . Asthma   . CAD (coronary artery disease)    a. LHC 9/11 (in setting of NSTEMI): LAD irregs, mLCx 50, pRCA 50, inf-apical AK;  b. Myoview 9/13: Normal stress nuclear study.  LV Ejection Fraction: 71%   . Carotid stenosis    a. Carotid US 9/13: bilat 0-39%;  b. Carotid US 7/15: no sig stenosis  . Chronic diastolic CHF (congestive heart failure) ([place])    a. Echo 7/15: Mild LVH, mild focal basal septal hypertrophy, EF 50-55%, no RWMA, Gr 1 DD  . COPD (chronic obstructive pulmonary disease) ([place])   . HTN (hypertension)   . Lung cancer [facility])    [name]  Marland Kitchen NSTEMI (non-ST elevated myocardial infarction) ([place])    with only mild to moderate CAD in September of 2011,There was concern for apival ballooning. >> ? Tako-Tsubo syndrome vs vasospasm     Family History  Problem Relation Age of Onset  . Heart disease Mother 44  . Asthma Mother   . Diabetes Mother   . Cancer Mother        Salivary  . Emphysema Mother   . Heart disease Unknown        POSITIVE FAMILY HX. OF   . Pancreatitis Unknown        QUESTIONABLE FROM ACE INHIBITORS  . COPD Sister      Social  History   Socioeconomic History  . Marital status: Divorced    Spouse name: Not on file  . Number of children: Not on file  . Years of education: Not on file  . Highest education level: Not on file  Social Needs  . Financial resource strain: Not on file  . Food insecurity - worry: Not on file  . Food insecurity - inability: Not on file  . Transportation needs - medical: Not on file  . Transportation needs - non-medical: Not on file  Occupational History  . Not on file  Tobacco Use  . Smoking status: Former Smoker    Packs/day: 1.50    Years: 42.00    Pack years: 63.00    Types: Cigarettes    Last attempt to quit: 04/26/2006    Years since quitting: 11.0  . Smokeless tobacco: Never Used  Substance and Sexual Activity  . Alcohol use: No    Alcohol/week: 0.0 oz  . Drug use: No  . Sexual activity: No  Other Topics Concern  . Not on file  Social History Narrative   Originally from [place]. Always lived in [place]. Prior travel to [facility]. 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 Has a dog at home. No birds,  mold, or hot tub exposure. No indoor plants. No carpet in her home. Does have draperies.      Allergies  Allergen Reactions  . Augmentin [Amoxicillin-Pot Clavulanate] Diarrhea  . Sulfa Antibiotics Rash and Shortness Of Breath  . Tape Other (See Comments)  . Amlodipine   . Pantoprazole Nausea And Vomiting  . Sulfonamide Derivatives Rash     Outpatient Medications Prior to Visit  Medication Sig Dispense Refill  . albuterol (PROVENTIL) (2.5 MG/3ML) 0.083% nebulizer solution Take 3 mLs (2.5 mg total) by nebulization every 4 (four) hours as needed for wheezing or shortness of breath. 120 mL 3  . aspirin 81 MG tablet Take 81 mg by mouth daily.      . budesonide-formoterol (SYMBICORT) 160-4.5 MCG/ACT inhaler Inhale 2 puffs into the lungs 2 (two) times daily. 1 Inhaler 6  . calcium carbonate 1250 MG capsule Take 1,250 mg by mouth daily.     . Cholecalciferol  (VITAMIN D3 PO) Take by mouth daily.    . furosemide (LASIX) 40 MG tablet Take 2 tablets (80 mg total) by mouth daily. 180 tablet 3  . losartan (COZAAR) 100 MG tablet Take 1 tablet (100 mg total) by mouth daily. 90 tablet 0  . metoprolol tartrate (LOPRESSOR) 25 MG tablet TAKE 1 TABLET BY MOUTH TWICE DAILY 180 tablet 1  . Omega-3 Fatty Acids (FISH OIL PO) Take by mouth daily.    . OXYGEN Inhale 2 L into the lungs. 2 liters continuous    . potassium chloride SA (K-DUR,KLOR-CON) 20 MEQ tablet TAKE 1 TABLET BY MOUTH DAILY 90 tablet 1  . Spacer/Aero-Holding Chambers (AEROCHAMBER MV) inhaler Use as instructed 1 each 0  . nitroGLYCERIN (NITROSTAT) 0.4 MG SL tablet Place 1 tablet (0.4 mg total) under the tongue every 5 (five) minutes as needed for chest pain. 25 tablet 12  . Glycopyrrolate (LONHALA MAGNAIR STARTER KIT) 25 MCG/ML SOLN Inhale 25 mcg into the lungs 2 (two) times daily. DX: J44.9 60 mL 11   No facility-administered medications prior to visit.        Objective:   Physical Exam Vitals:   05/23/17 1545  BP: (!) 142/82  Pulse: 85  SpO2: 96%  Weight: 213 lb (96.6 kg)  Height: '5\' 1"'$  (1.549 m)   Gen: Pleasant, well-nourished, in no distress,  normal affect  ENT: No lesions,  mouth clear,  oropharynx clear, no postnasal drip  Neck: No JVD, no TMG, no carotid bruits  Lungs: No use of accessory muscles, no dullness to percussion, clear without rales or rhonchi  Cardiovascular: RRR, heart sounds normal, no murmur or gallops, no peripheral edema  Musculoskeletal: No deformities, no cyanosis or clubbing  Neuro: alert, non focal  Skin: Warm, no lesions or rashes    CT chest 08/13/16 --  COMPARISON:  03/30/2016  FINDINGS: Cardiovascular: The heart size is normal. No pericardial effusion. Coronary artery calcification is noted. No thoracic aortic aneurysm.  Mediastinum/Nodes: No mediastinal lymphadenopathy. There is no hilar lymphadenopathy. The esophagus has normal imaging  features. There is no axillary lymphadenopathy.  Lungs/Pleura: 16 x 10 mm right apical nodule is stable. Volume loss again noted left hemithorax. Post treatment changes noted anterior and medial aspect of the remaining left lung. Irregular medial left lung opacity measured previously towards the apex at 4.9 x 1.7 cm now measures 4.8 x 1.6 cm peripheral left lung nodule measuring about 2 mm on image 50 is stable. 7 mm left lower lobe nodule (image 89 series 5) is unchanged. No  pulmonary edema or pleural effusion.  Upper Abdomen: Stable cyst upper pole left kidney. Tiny hiatal hernia.  Musculoskeletal: Bone windows reveal no worrisome lytic or sclerotic osseous lesions.  IMPRESSION: 1. Stable exam compared to 03/30/2016. The irregular opacity in the left suprahilar region is unchanged, but was hypermetabolic on previous PET imaging and continued close attention recommended. 2. Stable right apical pulmonary nodule. Continued close follow-up recommended as this also showed low level FDG accumulation.      Assessment & Plan:  COPD (chronic obstructive pulmonary disease) ([place]) Prefers Symbicort to alternative BD's.  Continue same.  Albuterol as needed  Primary cancer of left upper lobe of lung ([place]) Treated by wedge resection, Dr. Servando Snare.  Lung nodules Hypermetabolic pulmonary nodules.  Seen both radiation oncology and medical oncology, at this point has not undergone any therapy.  She states that Dr. Ashok Cordia told her that she would not tolerate.  I believe that she is waiting until she is symptomatic, might consider targeted therapy at that time.  I have asked her to follow-up with Dr. Julien Nordmann to ensure that we do not need to follow CT scans more frequently, discuss treatment again.  Baltazar Apo, MD, PhD 05/23/2017, 4:11 PM [facility] [phone] or if no answer [phone]

## 2017-05-23 NOTE — Assessment & Plan Note (Signed)
Prefers Symbicort to alternative BD's.  Continue same.  Albuterol as needed

## 2017-05-23 NOTE — Patient Instructions (Signed)
Please continue to use Symbicort 2 puffs twice a day. Keep albuterol available to use if needed for shortness of breath, coughing, chest tightness. Wear your oxygen at all times Please arrange for follow-up with Dr. Julien Nordmann to discuss the timing of any repeat CT scan of your chest, any potential therapy for your lung nodules Please arrange for follow-up with Dr. Ronnald Ramp regarding headaches. Follow with Dr Lamonte Sakai in 4 months or sooner if you have any problems.

## 2017-05-23 NOTE — Assessment & Plan Note (Signed)
Hypermetabolic pulmonary nodules.  Seen both radiation oncology and medical oncology, at this point has not undergone any therapy.  She states that Dr. Ashok Cordia told her that she would not tolerate.  I believe that she is waiting until she is symptomatic, might consider targeted therapy at that time.  I have asked her to follow-up with Dr. Julien Nordmann to ensure that we do not need to follow CT scans more frequently, discuss treatment again.

## 2017-05-23 NOTE — Assessment & Plan Note (Signed)
Treated by wedge resection, Dr. Servando Snare.

## 2017-05-25 ENCOUNTER — Other Ambulatory Visit: Payer: Self-pay | Admitting: Internal Medicine

## 2017-05-25 DIAGNOSIS — I10 Essential (primary) hypertension: Secondary | ICD-10-CM

## 2017-05-25 DIAGNOSIS — I2510 Atherosclerotic heart disease of native coronary artery without angina pectoris: Secondary | ICD-10-CM

## 2017-05-25 MED ORDER — METOPROLOL TARTRATE 25 MG PO TABS: 25.0000 mg | ORAL_TABLET | Freq: Two times a day (BID) | ORAL | 1 refills | Status: DC

## 2017-06-08 ENCOUNTER — Other Ambulatory Visit: Payer: Self-pay | Admitting: Internal Medicine

## 2017-06-09 ENCOUNTER — Ambulatory Visit: Payer: Medicare Other | Admitting: Family Medicine

## 2017-06-13 ENCOUNTER — Encounter: Payer: Self-pay | Admitting: Internal Medicine

## 2017-06-13 ENCOUNTER — Other Ambulatory Visit (INDEPENDENT_AMBULATORY_CARE_PROVIDER_SITE_OTHER): Payer: Medicare Other

## 2017-06-13 ENCOUNTER — Ambulatory Visit (INDEPENDENT_AMBULATORY_CARE_PROVIDER_SITE_OTHER): Payer: Medicare Other | Admitting: Internal Medicine

## 2017-06-13 ENCOUNTER — Ambulatory Visit: Payer: Self-pay

## 2017-06-13 ENCOUNTER — Ambulatory Visit (INDEPENDENT_AMBULATORY_CARE_PROVIDER_SITE_OTHER)
Admission: RE | Admit: 2017-06-13 | Discharge: 2017-06-13 | Disposition: A | Discharge status: Home / Self Care | Payer: Medicare Other | Source: Ambulatory Visit | Attending: Internal Medicine | Admitting: Internal Medicine

## 2017-06-13 VITALS — BP 134/70 | HR 88 | Temp 97.9°F | Resp 16 | Ht 61.0 in | Wt 214.0 lb

## 2017-06-13 DIAGNOSIS — Z85118 Personal history of other malignant neoplasm of bronchus and lung: Secondary | ICD-10-CM | POA: Diagnosis not present

## 2017-06-13 DIAGNOSIS — R112 Nausea with vomiting, unspecified: Secondary | ICD-10-CM | POA: Diagnosis not present

## 2017-06-13 DIAGNOSIS — R51 Headache: Secondary | ICD-10-CM

## 2017-06-13 DIAGNOSIS — I10 Essential (primary) hypertension: Secondary | ICD-10-CM

## 2017-06-13 DIAGNOSIS — C3412 Malignant neoplasm of upper lobe, left bronchus or lung: Secondary | ICD-10-CM

## 2017-06-13 DIAGNOSIS — J449 Chronic obstructive pulmonary disease, unspecified: Secondary | ICD-10-CM | POA: Diagnosis not present

## 2017-06-13 DIAGNOSIS — R519 Headache, unspecified: Secondary | ICD-10-CM

## 2017-06-13 DIAGNOSIS — R0602 Shortness of breath: Secondary | ICD-10-CM | POA: Diagnosis not present

## 2017-06-13 LAB — COMPREHENSIVE METABOLIC PANEL
ALK PHOS: 115 U/L (ref 39–117)
ALT: 24 U/L (ref 0–35)
AST: 14 U/L (ref 0–37)
Albumin: 3.9 g/dL (ref 3.5–5.2)
BUN: 16 mg/dL (ref 6–23)
CALCIUM: 9.4 mg/dL (ref 8.4–10.5)
CO2: 38 meq/L — AB (ref 19–32)
Chloride: 100 mEq/L (ref 96–112)
Creatinine, Ser: 0.66 mg/dL (ref 0.40–1.20)
GFR: 91.58 mL/min (ref >60.00)
Glucose, Bld: 113 mg/dL — ABNORMAL HIGH (ref 70–99)
POTASSIUM: 3.7 meq/L (ref 3.5–5.1)
Sodium: 143 mEq/L (ref 135–145)
Total Bilirubin: 0.3 mg/dL (ref 0.2–1.2)
Total Protein: 6.8 g/dL (ref 6.0–8.3)

## 2017-06-13 LAB — CBC WITH DIFFERENTIAL/PLATELET
BASOS PCT: 0.5 % (ref 0.0–3.0)
Basophils Absolute: 0.1 10*3/uL (ref 0.0–0.1)
EOS PCT: 2.4 % (ref 0.0–5.0)
Eosinophils Absolute: 0.2 10*3/uL (ref 0.0–0.7)
HEMATOCRIT: 40.6 % (ref 36.0–46.0)
Hemoglobin: 13.3 g/dL (ref 12.0–15.0)
LYMPHS ABS: 1.9 10*3/uL (ref 0.7–4.0)
LYMPHS PCT: 19.2 % (ref 12.0–46.0)
MCHC: 32.8 g/dL (ref 30.0–36.0)
MCV: 88.3 fl (ref 78.0–100.0)
MONOS PCT: 8.4 % (ref 3.0–12.0)
Monocytes Absolute: 0.8 10*3/uL (ref 0.1–1.0)
NEUTROS ABS: 6.8 10*3/uL (ref 1.4–7.7)
Neutrophils Relative %: 69.5 % (ref 43.0–77.0)
PLATELETS: 322 10*3/uL (ref 150.0–400.0)
RBC: 4.6 Mil/uL (ref 3.87–5.11)
RDW: 14.2 % (ref 11.5–15.5)
WBC: 9.8 10*3/uL (ref 4.0–10.5)

## 2017-06-13 LAB — AMYLASE: Amylase: 29 U/L (ref 27–131)

## 2017-06-13 LAB — LIPASE: Lipase: 75 U/L — ABNORMAL HIGH (ref 11.0–59.0)

## 2017-06-13 NOTE — Patient Instructions (Signed)

## 2017-06-13 NOTE — Progress Notes (Signed)
Subjective:  Patient ID: Brandy Cameron, female    DOB: Nov 28, 1935  Age: 82 y.o. MRN: [id number]  CC: Headache   HPI Brandy Cameron presents for a 2-week history of new onset frontal headache that she describes as an achy sensation.  She has also had intermittent nausea, vomiting, diarrhea, and constipation.  She has had a mild cough productive of white phlegm and she complains of shortness of breath, weakness, fatigue, dizziness, and lightheadedness.  Outpatient Medications Prior to Visit  Medication Sig Dispense Refill  . albuterol (PROVENTIL) (2.5 MG/3ML) 0.083% nebulizer solution Take 3 mLs (2.5 mg total) by nebulization every 4 (four) hours as needed for wheezing or shortness of breath. 120 mL 3  . aspirin 81 MG tablet Take 81 mg by mouth daily.      . budesonide-formoterol (SYMBICORT) 160-4.5 MCG/ACT inhaler Inhale 2 puffs into the lungs 2 (two) times daily. 1 Inhaler 6  . calcium carbonate 1250 MG capsule Take 1,250 mg by mouth daily.     . Cholecalciferol (VITAMIN D3 PO) Take by mouth daily.    . furosemide (LASIX) 40 MG tablet Take 2 tablets (80 mg total) by mouth daily. 180 tablet 3  . losartan (COZAAR) 100 MG tablet Take 1 tablet (100 mg total) by mouth daily. 90 tablet 0  . metoprolol tartrate (LOPRESSOR) 25 MG tablet Take 1 tablet (25 mg total) by mouth 2 (two) times daily. 180 tablet 1  . Omega-3 Fatty Acids (FISH OIL PO) Take by mouth daily.    . OXYGEN Inhale 2 L into the lungs. 2 liters continuous    . potassium chloride SA (K-DUR,KLOR-CON) 20 MEQ tablet TAKE 1 TABLET BY MOUTH DAILY 90 tablet 0  . Spacer/Aero-Holding Chambers (AEROCHAMBER MV) inhaler Use as instructed 1 each 0  . nitroGLYCERIN (NITROSTAT) 0.4 MG SL tablet Place 1 tablet (0.4 mg total) under the tongue every 5 (five) minutes as needed for chest pain. 25 tablet 12   No facility-administered medications prior to visit.     ROS Review of Systems  Constitutional: Positive for fatigue. Negative for  activity change, appetite change, chills, diaphoresis, fever and unexpected weight change.  HENT: Negative.  Negative for trouble swallowing.   Eyes: Negative.  Negative for visual disturbance.  Respiratory: Positive for cough and shortness of breath. Negative for chest tightness and wheezing.   Cardiovascular: Negative for chest pain, palpitations and leg swelling.  Gastrointestinal: Positive for constipation, diarrhea, nausea and vomiting. Negative for abdominal pain.  Endocrine: Negative.   Genitourinary: Negative.   Musculoskeletal: Negative.  Negative for arthralgias and back pain.  Skin: Negative.  Negative for color change and pallor.  Neurological: Positive for dizziness, weakness, light-headedness and headaches. Negative for seizures, syncope and numbness.  Hematological: Negative for adenopathy. Does not bruise/bleed easily.  Psychiatric/Behavioral: Negative.     Objective:  BP 134/70 (BP Location: Left Arm, Patient Position: Sitting, Cuff Size: Large)   Pulse 88   Temp 97.9 F (36.6 C) (Oral)   Resp 16   Ht 5\' 1"  (1.549 m)   Wt 214 lb (97.1 kg)   SpO2 99%   BMI 40.43 kg/m   BP Readings from Last 3 Encounters:  06/13/17 134/70  05/23/17 (!) 142/82  05/11/17 134/81    Wt Readings from Last 3 Encounters:  06/13/17 214 lb (97.1 kg)  05/23/17 213 lb (96.6 kg)  05/11/17 218 lb (98.9 kg)    Physical Exam  Constitutional: She is oriented to person, place, and time.  Non-toxic appearance. She does not have a sickly appearance. She does not appear ill. No distress.  HENT:  Mouth/Throat: Oropharynx is clear and moist.  Eyes: Conjunctivae are normal. Left eye exhibits no discharge. No scleral icterus.  Neck: Neck supple. No JVD present.  Cardiovascular: Normal rate and normal heart sounds.  No murmur heard. Pulmonary/Chest: Effort normal and breath sounds normal. No respiratory distress. She has no wheezes. She has no rales.  Abdominal: Soft. Bowel sounds are normal.  She exhibits no distension and no mass. There is no tenderness. There is no guarding.  Musculoskeletal: Normal range of motion. She exhibits no edema, tenderness or deformity.  Lymphadenopathy:    She has no cervical adenopathy.  Neurological: She is alert and oriented to person, place, and time. She has normal reflexes. She displays normal reflexes. No cranial nerve deficit. She exhibits normal muscle tone. Coordination normal.  Skin: Skin is warm. No rash noted. She is not diaphoretic. No erythema. No pallor.  Psychiatric: She has a normal mood and affect. Her behavior is normal. Judgment and thought content normal.    Lab Results  Component Value Date   WBC 9.8 06/13/2017   HGB 13.3 06/13/2017   HCT 40.6 06/13/2017   PLT 322.0 06/13/2017   GLUCOSE 113 (H) 06/13/2017   CHOL 122 04/27/2017   TRIG 306.0 (H) 04/27/2017   HDL 39.90 04/27/2017   LDLDIRECT 55.0 04/27/2017   LDLCALC 53 10/07/2015   ALT 24 06/13/2017   AST 14 06/13/2017   NA 143 06/13/2017   K 3.7 06/13/2017   CL 100 06/13/2017   CREATININE 0.66 06/13/2017   BUN 16 06/13/2017   CO2 38 (H) 06/13/2017   TSH 2.74 04/27/2017   INR 1.00 11/22/2014   HGBA1C 6.0 04/27/2017    Dg Chest 2 View  Result Date: 03/02/2017 CLINICAL DATA:  Influenza.  History of left lung cancer. EXAM: CHEST  2 VIEW COMPARISON:  01/02/2017 chest radiograph. FINDINGS: Surgical sutures and clips overlie the left upper lung. Stable cardiomediastinal silhouette with normal heart size. No pneumothorax. No pleural effusion. Hyperinflated lungs. No pulmonary edema. Stable right apical pulmonary nodule and focal medial left upper lung opacity. No acute consolidative airspace disease. IMPRESSION: 1. No acute consolidative airspace disease. 2. Hyperinflated lungs, suggesting COPD. 3. Stable right apical pulmonary nodule and focal medial left upper lung opacity, please see the 08/13/2016 chest CT report for further details and follow-up recommendations.  Electronically Signed   By: Ilona Sorrel M.D.   On: 03/02/2017 14:22    Assessment & Plan:   Brandy Cameron was seen today for headache.  Diagnoses and all orders for this visit:  Essential hypertension- Her blood pressure is well controlled.  Electrolytes and renal function are normal. -     Comprehensive metabolic panel; Future -     CBC with Differential/Platelet; Future  Intractable vomiting with nausea, unspecified vomiting type- Her lab work is only remarkable for a mildly elevated lipase.  I do not see any abdominal pathology.  She has a headache so we will scan her brain to see if she has a mass, bleeding, NPH, or CVA. -     Comprehensive metabolic panel; Future -     CBC with Differential/Platelet; Future -     Amylase; Future -     Lipase; Future -     CT Head Wo Contrast; Future  Intractable episodic headache, unspecified headache type- Her neurologic exam is normal and she has no localizing symptoms.  I have ordered  a CT scan of the brain to see if she has metastatic lesions. -     CT Head Wo Contrast; Future  Primary cancer of left upper lobe of lung ([facility]) -     CT Head Wo Contrast; Future   I am having Brandy Cameron maintain her aspirin, AEROCHAMBER MV, OXYGEN, calcium carbonate, albuterol, furosemide, nitroGLYCERIN, budesonide-formoterol, Omega-3 Fatty Acids (FISH OIL PO), Cholecalciferol (VITAMIN D3 PO), losartan, metoprolol tartrate, and potassium chloride SA.  No orders of the defined types were placed in this encounter.    Follow-up: No Follow-up on file.  [name], MD

## 2017-06-13 NOTE — Telephone Encounter (Signed)
  Reason for Disposition . [1] MILD or MODERATE vomiting AND [2] present > 48 hours (2 days) (Exception: mild vomiting with associated diarrhea)  Answer Assessment - Initial Assessment Questions 1. VOMITING SEVERITY: "How many times have you vomited in the past 24 hours?"     - MILD:  1 - 2 times/day    - MODERATE: 3 - 5 times/day, decreased oral intake without significant weight loss or symptoms of dehydration    - SEVERE: 6 or more times/day, vomits everything or nearly everything, with significant weight loss, symptoms of dehydration      Just in the morning and maybe at night 2. ONSET: "When did the vomiting begin?"      I week ago 3. FLUIDS: "What fluids or food have you vomited up today?" "Have you been able to keep any fluids down?"     Keeping fluids down 4. ABDOMINAL PAIN: "Are your having any abdominal pain?" If yes : "How bad is it and what does it feel like?" (e.g., crampy, dull, intermittent, constant)      No 5. DIARRHEA: "Is there any diarrhea?" If so, ask: "How many times today?"      Not many - maybe 2 6. CONTACTS: "Is there anyone else in the family with the same symptoms?"      No 7. CAUSE: "What do you think is causing your vomiting?"     Unsure 8. HYDRATION STATUS: "Any signs of dehydration?" (e.g., dry mouth [not only dry lips], too weak to stand) "When did you last urinate?"     No 9. OTHER SYMPTOMS: "Do you have any other symptoms?" (e.g., fever, headache, vertigo, vomiting blood or coffee grounds, recent head injury)     Headache 10. PREGNANCY: "Is there any chance you are pregnant?" "When was your last menstrual period?"       No  Protocols used: [facility]

## 2017-06-15 ENCOUNTER — Other Ambulatory Visit: Payer: Self-pay | Admitting: Medical Oncology

## 2017-06-15 ENCOUNTER — Telehealth: Payer: Self-pay | Admitting: Medical Oncology

## 2017-06-15 NOTE — Progress Notes (Signed)
HPI: FU CAD, diastolic HF, HTN, HL. Since last seen, over the past 3 weeks she has had weakness, nausea and vomiting with any p.o. intake, general malaise, constipation.  She has chronic dyspnea.  She had jaw pain last week that resolved with nitroglycerin but denies chest pain.  There is no fevers or chills and no pedal edema.  Some dizziness but no syncope.  Venous Dopplers February 2018 No DVT  Echo 1/66 Normal systolic function, grade 1 diastolic dysfunction.  Carotid US 7/15 No significant stenosis  Myoview 3/17 Ejection fraction 66% and no ischemia or infarction.  LHC 9/11 LM: OK LAD: irregs LCx: Mid 50% RCA: prox 50% LV - inf-apical AK  Current Outpatient Medications  Medication Sig Dispense Refill  . albuterol (PROVENTIL) (2.5 MG/3ML) 0.083% nebulizer solution Take 3 mLs (2.5 mg total) by nebulization every 4 (four) hours as needed for wheezing or shortness of breath. 120 mL 3  . aspirin 81 MG tablet Take 81 mg by mouth daily.      . budesonide-formoterol (SYMBICORT) 160-4.5 MCG/ACT inhaler Inhale 2 puffs into the lungs 2 (two) times daily. 1 Inhaler 6  . calcium carbonate 1250 MG capsule Take 1,250 mg by mouth daily.     . Cholecalciferol (VITAMIN D3 PO) Take by mouth daily.    . furosemide (LASIX) 40 MG tablet Take 2 tablets (80 mg total) by mouth daily. 180 tablet 3  . losartan (COZAAR) 100 MG tablet Take 1 tablet (100 mg total) by mouth daily. 90 tablet 0  . metoprolol tartrate (LOPRESSOR) 25 MG tablet Take 1 tablet (25 mg total) by mouth 2 (two) times daily. 180 tablet 1  . Omega-3 Fatty Acids (FISH OIL PO) Take by mouth daily.    . OXYGEN Inhale 2 L into the lungs. 2 liters continuous    . potassium chloride SA (K-DUR,KLOR-CON) 20 MEQ tablet TAKE 1 TABLET BY MOUTH DAILY 90 tablet 0  . Spacer/Aero-Holding Chambers (AEROCHAMBER MV) inhaler Use as instructed 1 each 0  . nitroGLYCERIN (NITROSTAT) 0.4 MG SL tablet Place 1 tablet (0.4 mg total) under the  tongue every 5 (five) minutes as needed for chest pain. 25 tablet 12   No current facility-administered medications for this visit.      Past Medical History:  Diagnosis Date  . Asthma   . CAD (coronary artery disease)    a. LHC 9/11 (in setting of NSTEMI): LAD irregs, mLCx 50, pRCA 50, inf-apical AK;  b. Myoview 9/13: Normal stress nuclear study.  LV Ejection Fraction: 71%   . Carotid stenosis    a. Carotid US 9/13: bilat 0-39%;  b. Carotid US 7/15: no sig stenosis  . Chronic diastolic CHF (congestive heart failure) ([place])    a. Echo 7/15: Mild LVH, mild focal basal septal hypertrophy, EF 50-55%, no RWMA, Gr 1 DD  . COPD (chronic obstructive pulmonary disease) ([place])   . HTN (hypertension)   . Lung cancer [facility])    [name]  Marland Kitchen NSTEMI (non-ST elevated myocardial infarction) ([place])    with only mild to moderate CAD in September of 2011,There was concern for apival ballooning. >> ? Tako-Tsubo syndrome vs vasospasm    Past Surgical History:  Procedure Laterality Date  . CATARACT SURGERY    . FOOT SURGERY    . GALLBLADDER SURGERY    . KNEE SURGERY    . LUNG LOBECTOMY    .  VESICOVAGINAL FISTULA CLOSURE W/ TAH      Social History   Socioeconomic History  . Marital status: Divorced    Spouse name: Not on file  . Number of children: Not on file  . Years of education: Not on file  . Highest education level: Not on file  Social Needs  . Financial resource strain: Not on file  . Food insecurity - worry: Not on file  . Food insecurity - inability: Not on file  . Transportation needs - medical: Not on file  . Transportation needs - non-medical: Not on file  Occupational History  . Not on file  Tobacco Use  . Smoking status: Former Smoker    Packs/day: 1.50    Years: 42.00    Pack years: 63.00    Types: Cigarettes    Last attempt to quit: 04/26/2006    Years since quitting: 11.1  . Smokeless  tobacco: Never Used  Substance and Sexual Activity  . Alcohol use: No    Alcohol/week: 0.0 oz  . Drug use: No  . Sexual activity: No  Other Topics Concern  . Not on file  Social History Narrative   Originally from [place]. Always lived in [place]. Prior travel to [facility]. 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 Has a dog at home. No birds, mold, or hot tub exposure. No indoor plants. No carpet in her home. Does have draperies.     Family History  Problem Relation Age of Onset  . Heart disease Mother 68  . Asthma Mother   . Diabetes Mother   . Cancer Mother        Salivary  . Emphysema Mother   . Heart disease Unknown        POSITIVE FAMILY HX. OF   . Pancreatitis Unknown        QUESTIONABLE FROM ACE INHIBITORS  . COPD Sister     ROS: no fevers or chills, productive cough, hemoptysis, dysphasia, odynophagia, melena, hematochezia, dysuria, hematuria, rash, seizure activity, orthopnea, PND, pedal edema, claudication. Remaining systems are negative.  Physical Exam: Well-developed obese chronically ill appearing no acute distress.  Skin is warm and dry.  HEENT is normal.  Neck is supple.  Chest with diminished BS Cardiovascular exam is regular rate and rhythm.  Abdominal exam nontender or distended. No masses palpated. Extremities show no edema. neuro grossly intact  ECG-normal sinus rhythm at a rate of 93.  Low voltage.  No ST changes.  Personally reviewed  A/P  1 coronary artery disease-patient had recent jaw pain of uncertain etiology.  Electrocardiogram showed no acute ST changes.  Not clearly cardiac.  I do not think she would tolerate a nuclear study at present but we can consider in the future if she improves.  Continue aspirin.  Statin discontinued previously because of myalgias.  2 chronic diastolic congestive heart failure-also probable component of right heart failure.  Continue present dose of Lasix as she appears to be euvolemic.   3  hypertension- Blood pressure is controlled.  Continue present medications.  4 hyperlipidemia-statin because of myalgias.   5 dyspnea-this has been felt to be multifactorial including diastolic congestive heart failure, obesity ventilation syndrome, COPD and lung cancer.  She has also had prior lung resection.   6 nausea/general malaise-etiology unclear.  She is scheduled to see primary care on Monday and oncology on Tuesday.  Further evaluation based on their assessment.  Kirk Ruths, MD

## 2017-06-15 NOTE — Telephone Encounter (Signed)
'  I have been throwing up for over a week". Saw Dr Lamonte Sakai this month who said see Julien Nordmann, then saw PCP this week who did did brain scan and it was "fine". She wants to see [facility] and have another scan "to see if that cancer has moved anywhere". October appt last year cancelled.

## 2017-06-15 NOTE — Telephone Encounter (Signed)
OK to come and see me or Kristen in 1-2 week after repeating CT of the chest which was missed.

## 2017-06-16 ENCOUNTER — Telehealth: Payer: Self-pay

## 2017-06-16 DIAGNOSIS — J449 Chronic obstructive pulmonary disease, unspecified: Secondary | ICD-10-CM | POA: Diagnosis not present

## 2017-06-16 DIAGNOSIS — Z85118 Personal history of other malignant neoplasm of bronchus and lung: Secondary | ICD-10-CM | POA: Diagnosis not present

## 2017-06-16 DIAGNOSIS — R0602 Shortness of breath: Secondary | ICD-10-CM | POA: Diagnosis not present

## 2017-06-16 NOTE — Telephone Encounter (Signed)
Spoke with patient concerning upcoming appointment added to her schedule. Mailed a calender of current changes. Per 2/20 los

## 2017-06-22 ENCOUNTER — Encounter: Payer: Self-pay | Admitting: Cardiology

## 2017-06-22 ENCOUNTER — Ambulatory Visit (INDEPENDENT_AMBULATORY_CARE_PROVIDER_SITE_OTHER): Payer: Medicare Other | Admitting: Cardiology

## 2017-06-22 VITALS — BP 130/74 | HR 93 | Ht 61.0 in | Wt 211.4 lb

## 2017-06-22 DIAGNOSIS — I38 Endocarditis, valve unspecified: Secondary | ICD-10-CM

## 2017-06-22 DIAGNOSIS — I5032 Chronic diastolic (congestive) heart failure: Secondary | ICD-10-CM

## 2017-06-22 DIAGNOSIS — E7800 Pure hypercholesterolemia, unspecified: Secondary | ICD-10-CM

## 2017-06-22 DIAGNOSIS — I10 Essential (primary) hypertension: Secondary | ICD-10-CM

## 2017-06-22 DIAGNOSIS — I2510 Atherosclerotic heart disease of native coronary artery without angina pectoris: Secondary | ICD-10-CM | POA: Diagnosis not present

## 2017-06-22 NOTE — Patient Instructions (Signed)
Your physician recommends that you schedule a follow-up appointment in: 3 MONTHS WITH DR CRENSHAW  

## 2017-06-27 ENCOUNTER — Ambulatory Visit (INDEPENDENT_AMBULATORY_CARE_PROVIDER_SITE_OTHER): Payer: Medicare Other | Admitting: Internal Medicine

## 2017-06-27 ENCOUNTER — Telehealth: Payer: Self-pay | Admitting: *Deleted

## 2017-06-27 ENCOUNTER — Other Ambulatory Visit: Payer: Self-pay | Admitting: Medical Oncology

## 2017-06-27 ENCOUNTER — Ambulatory Visit (INDEPENDENT_AMBULATORY_CARE_PROVIDER_SITE_OTHER)
Admission: RE | Admit: 2017-06-27 | Discharge: 2017-06-27 | Disposition: A | Discharge status: Home / Self Care | Payer: Medicare Other | Source: Ambulatory Visit | Attending: Internal Medicine | Admitting: Internal Medicine

## 2017-06-27 ENCOUNTER — Encounter: Payer: Self-pay | Admitting: Internal Medicine

## 2017-06-27 VITALS — BP 128/60 | HR 87 | Temp 98.0°F | Resp 16 | Ht 61.0 in | Wt 216.0 lb

## 2017-06-27 DIAGNOSIS — R112 Nausea with vomiting, unspecified: Secondary | ICD-10-CM

## 2017-06-27 DIAGNOSIS — K5900 Constipation, unspecified: Secondary | ICD-10-CM | POA: Diagnosis not present

## 2017-06-27 DIAGNOSIS — R1030 Lower abdominal pain, unspecified: Secondary | ICD-10-CM | POA: Diagnosis not present

## 2017-06-27 DIAGNOSIS — C3412 Malignant neoplasm of upper lobe, left bronchus or lung: Secondary | ICD-10-CM

## 2017-06-27 NOTE — Progress Notes (Signed)
Subjective:  Patient ID: Brandy Cameron, female    DOB: 01/23/1936  Age: 82 y.o. MRN: [id number]  CC: Headache   HPI Brandy Cameron presents for f/up - She was recently seen for a headache and recurrent episodes of nausea and vomiting.  A CT scan of her brain was negative for masses or lesions.  Her headache is better but she continues to complain of recurrent episodes of nausea and vomiting.  She tells me she is not losing weight because she eats a lot of ice cream.  She complains of mild constipation and diffuse, bilateral lower quadrant abdominal pain that she describes as an intermittent achy sensation.  She tells me that she has follow-up CT scans of her chest and abdomen the day after this appointment that was ordered by her oncologist.  Outpatient Medications Prior to Visit  Medication Sig Dispense Refill  . albuterol (PROVENTIL) (2.5 MG/3ML) 0.083% nebulizer solution Take 3 mLs (2.5 mg total) by nebulization every 4 (four) hours as needed for wheezing or shortness of breath. 120 mL 3  . aspirin 81 MG tablet Take 81 mg by mouth daily.      . budesonide-formoterol (SYMBICORT) 160-4.5 MCG/ACT inhaler Inhale 2 puffs into the lungs 2 (two) times daily. 1 Inhaler 6  . calcium carbonate 1250 MG capsule Take 1,250 mg by mouth daily.     . Cholecalciferol (VITAMIN D3 PO) Take by mouth daily.    . furosemide (LASIX) 40 MG tablet Take 2 tablets (80 mg total) by mouth daily. 180 tablet 3  . losartan (COZAAR) 100 MG tablet Take 1 tablet (100 mg total) by mouth daily. 90 tablet 0  . metoprolol tartrate (LOPRESSOR) 25 MG tablet Take 1 tablet (25 mg total) by mouth 2 (two) times daily. 180 tablet 1  . Omega-3 Fatty Acids (FISH OIL PO) Take by mouth daily.    . OXYGEN Inhale 2 L into the lungs. 2 liters continuous    . potassium chloride SA (K-DUR,KLOR-CON) 20 MEQ tablet TAKE 1 TABLET BY MOUTH DAILY 90 tablet 0  . Spacer/Aero-Holding Chambers (AEROCHAMBER MV) inhaler Use as instructed 1 each 0    . nitroGLYCERIN (NITROSTAT) 0.4 MG SL tablet Place 1 tablet (0.4 mg total) under the tongue every 5 (five) minutes as needed for chest pain. 25 tablet 12   No facility-administered medications prior to visit.     ROS Review of Systems  Constitutional: Negative for appetite change, chills, diaphoresis, fatigue and unexpected weight change.  HENT: Negative.  Negative for trouble swallowing and voice change.   Eyes: Negative for visual disturbance.  Respiratory: Negative for cough, chest tightness, shortness of breath and wheezing.   Gastrointestinal: Positive for abdominal pain, nausea and vomiting. Negative for abdominal distention, blood in stool, constipation, diarrhea and rectal pain.  Endocrine: Negative.   Genitourinary: Negative.  Negative for decreased urine volume, difficulty urinating, dysuria, frequency and urgency.  Musculoskeletal: Negative.  Negative for arthralgias and myalgias.  Skin: Negative.  Negative for color change and pallor.  Allergic/Immunologic: Negative.   Neurological: Positive for headaches. Negative for dizziness, weakness, light-headedness and numbness.  Hematological: Negative for adenopathy. Does not bruise/bleed easily.  Psychiatric/Behavioral: Negative.     Objective:  BP 128/60 (BP Location: Left Arm, Patient Position: Sitting, Cuff Size: Large)   Pulse 87   Temp 98 F (36.7 C) (Oral)   Ht 5\' 1"  (1.549 m)   Wt 216 lb (98 kg)   SpO2 96%   BMI 40.81 kg/m  BP Readings from Last 3 Encounters:  06/27/17 128/60  06/22/17 130/74  06/13/17 134/70    Wt Readings from Last 3 Encounters:  06/27/17 216 lb (98 kg)  06/22/17 211 lb 6.4 oz (95.9 kg)  06/13/17 214 lb (97.1 kg)    Physical Exam  Constitutional: She is oriented to person, place, and time.  Non-toxic appearance. She does not have a sickly appearance. She does not appear ill. No distress.  HENT:  Mouth/Throat: Oropharynx is clear and moist. No oropharyngeal exudate.  Eyes:  Conjunctivae are normal. Left eye exhibits no discharge. No scleral icterus.  Neck: Normal range of motion. Neck supple. No JVD present. No thyromegaly present.  Cardiovascular: Normal rate and normal heart sounds. Exam reveals no gallop.  No murmur heard. Pulmonary/Chest: Effort normal and breath sounds normal. No respiratory distress. She has no wheezes. She has no rales.  Abdominal: Soft. Normal appearance. She exhibits no distension and no mass. Bowel sounds are decreased. There is no hepatosplenomegaly, splenomegaly or hepatomegaly. There is no tenderness. There is no rebound and no CVA tenderness.  Musculoskeletal: Normal range of motion. She exhibits no edema, tenderness or deformity.  Lymphadenopathy:    She has no cervical adenopathy.  Neurological: She is alert and oriented to person, place, and time.  Skin: Skin is warm and dry. No rash noted. She is not diaphoretic. No erythema. No pallor.  Vitals reviewed.   Lab Results  Component Value Date   WBC 9.3 06/28/2017   HGB 13.3 06/13/2017   HCT 40.4 06/28/2017   PLT 288 06/28/2017   GLUCOSE 108 06/28/2017   CHOL 122 04/27/2017   TRIG 306.0 (H) 04/27/2017   HDL 39.90 04/27/2017   LDLDIRECT 55.0 04/27/2017   LDLCALC 53 10/07/2015   ALT 26 06/28/2017   AST 16 06/28/2017   NA 146 (H) 06/28/2017   K 4.5 06/28/2017   CL 101 06/28/2017   CREATININE 0.73 06/28/2017   BUN 16 06/28/2017   CO2 37 (H) 06/28/2017   TSH 2.74 04/27/2017   INR 1.00 11/22/2014   HGBA1C 6.0 04/27/2017    Ct Head Wo Contrast  Result Date: 06/13/2017 CLINICAL DATA:  Headache x3 weeks EXAM: CT HEAD WITHOUT CONTRAST TECHNIQUE: Contiguous axial images were obtained from the base of the skull through the vertex without intravenous contrast. COMPARISON:  MRI brain dated 12/01/2011 FINDINGS: Brain: No evidence of acute infarction, hemorrhage, hydrocephalus, extra-axial collection or mass lesion/mass effect. Subcortical white matter and periventricular small  vessel ischemic changes. Mild cortical atrophy. Vascular: Subcortical white matter and periventricular small vessel ischemic changes. Skull: Normal. Negative for fracture or focal lesion. Sinuses/Orbits: The visualized paranasal sinuses are essentially clear. The mastoid air cells are unopacified. Other: None. IMPRESSION: No evidence of acute intracranial abnormality. Atrophy with small vessel ischemic changes. Electronically Signed   By: Julian Hy M.D.   On: 06/13/2017 16:16   Ct Chest Wo Contrast  Result Date: 06/29/2017 CLINICAL DATA:  Lung cancer. Chest pain, cough and shortness of breath. EXAM: CT CHEST WITHOUT CONTRAST TECHNIQUE: Multidetector CT imaging of the chest was performed following the standard protocol without IV contrast. COMPARISON:  08/13/2016. FINDINGS: Cardiovascular: Atherosclerotic calcification of the arterial vasculature, including coronary arteries. Heart size normal. No pericardial effusion. Mediastinum/Nodes: Mediastinal lymph nodes are not enlarged by CT size criteria. Hilar regions are difficult to evaluate without IV contrast. No axillary adenopathy. Esophagus is grossly unremarkable. Lungs/Pleura: 0.8 x 1.5 cm nodule in the apical segment right upper lobe is stable. Centrilobular emphysema. Masslike  consolidation in the apical left upper lobe has enlarged, measuring approximately 2.9 x 6.0 cm, previously 1.6 x 4.8 cm, with adjacent nodularity and increasing volume loss. Brachytherapy seeds are seen in the anterior left upper lobe. 6 mm right lower lobe nodule (image 90), stable. Trace loculated left apical pleural fluid. Airway is unremarkable. Upper Abdomen: Visualized portions of the liver and right adrenal gland are unremarkable. Slight thickening of the lateral limb left adrenal gland appears new. Visualized portion of the right kidney is unremarkable. Low-attenuation lesion in the upper pole left kidney measures 2.1 cm, difficult to further characterize without  post-contrast imaging. Visualized portions of the spleen and pancreas are unremarkable. Tiny hiatal hernia. Cholecystectomy. No upper abdominal adenopathy. Musculoskeletal: Degenerative changes in the spine. No worrisome lytic or sclerotic lesions. IMPRESSION: 1. Enlarging masslike consolidation in the left upper lobe and new thickening of the lateral limb left adrenal gland, findings highly worrisome for disease progression. 2. Right upper and right lower lobe nodules are stable. 3. Aortic atherosclerosis (ICD10-170.0). Coronary artery calcification. 4.  Emphysema (ICD10-J43.9). Electronically Signed   By: Lorin Picket M.D.   On: 06/29/2017 07:35     Assessment & Plan:   Rolene was seen today for headache.  Diagnoses and all orders for this visit:  Lower abdominal pain- Her symptoms and exam are not consistent with an acute abdominal process.  Plain films are negative for stool retention or ileus.  It was noted the day after this visit that a CT scan showed a new raised lesion on her left adrenal gland which most likely explains her symptoms. -     DG Abd Acute W/Chest; Future  Intractable vomiting with nausea, unspecified vomiting type -I offered something to treat the nausea and vomiting but she was not interested.  She will follow-up with her oncologist.  -     DG Abd Acute W/Chest; Future   I am having Clarece M. Hamed maintain her aspirin, AEROCHAMBER MV, OXYGEN, calcium carbonate, albuterol, furosemide, nitroGLYCERIN, budesonide-formoterol, Omega-3 Fatty Acids (FISH OIL PO), Cholecalciferol (VITAMIN D3 PO), losartan, metoprolol tartrate, and potassium chloride SA.  No orders of the defined types were placed in this encounter.    Follow-up: No Follow-up on file.  [name], MD

## 2017-06-27 NOTE — Telephone Encounter (Signed)
"  Call received about registering for appointment tomorrow.  When do I register?  Nothing was said about when I cannot eat."  Instructed to register for lab at 1:45 pm, followed by [facility] radiology for CT at 2:45.  May eat tomorrow.   "Scan is with contrast so I cannot eat."  Nothing to eat four hours before scans with contrast means no eating after 11:00 am.  Reports she will not eat after 11:00 am in preperation of CT chest.

## 2017-06-28 ENCOUNTER — Ambulatory Visit (HOSPITAL_COMMUNITY)
Admission: RE | Admit: 2017-06-28 | Discharge: 2017-06-28 | Disposition: A | Discharge status: Home / Self Care | Payer: Medicare Other | Source: Ambulatory Visit | Attending: Internal Medicine | Admitting: Internal Medicine

## 2017-06-28 ENCOUNTER — Other Ambulatory Visit: Payer: Medicare Other

## 2017-06-28 ENCOUNTER — Inpatient Hospital Stay: Payer: Medicare Other | Attending: Internal Medicine

## 2017-06-28 ENCOUNTER — Encounter (HOSPITAL_COMMUNITY): Payer: Self-pay

## 2017-06-28 DIAGNOSIS — I700 Atherosclerosis of aorta: Secondary | ICD-10-CM | POA: Diagnosis not present

## 2017-06-28 DIAGNOSIS — I2510 Atherosclerotic heart disease of native coronary artery without angina pectoris: Secondary | ICD-10-CM | POA: Insufficient documentation

## 2017-06-28 DIAGNOSIS — C3490 Malignant neoplasm of unspecified part of unspecified bronchus or lung: Secondary | ICD-10-CM | POA: Diagnosis not present

## 2017-06-28 DIAGNOSIS — J439 Emphysema, unspecified: Secondary | ICD-10-CM | POA: Diagnosis not present

## 2017-06-28 DIAGNOSIS — R918 Other nonspecific abnormal finding of lung field: Secondary | ICD-10-CM | POA: Diagnosis not present

## 2017-06-28 DIAGNOSIS — C3412 Malignant neoplasm of upper lobe, left bronchus or lung: Secondary | ICD-10-CM | POA: Insufficient documentation

## 2017-06-28 LAB — CMP (CANCER CENTER ONLY)
ALK PHOS: 123 U/L (ref 40–150)
ALT: 26 U/L (ref 0–55)
ANION GAP: 8 (ref 3–11)
AST: 16 U/L (ref 5–34)
Albumin: 3.5 g/dL (ref 3.5–5.0)
BILIRUBIN TOTAL: 0.3 mg/dL (ref 0.2–1.2)
BUN: 16 mg/dL (ref 7–26)
CALCIUM: 10.2 mg/dL (ref 8.4–10.4)
CO2: 37 mmol/L — ABNORMAL HIGH (ref 22–29)
Chloride: 101 mmol/L (ref 98–109)
Creatinine: 0.73 mg/dL (ref 0.60–1.10)
Glucose, Bld: 108 mg/dL (ref 70–140)
Potassium: 4.5 mmol/L (ref 3.5–5.1)
Sodium: 146 mmol/L — ABNORMAL HIGH (ref 136–145)
TOTAL PROTEIN: 6.6 g/dL (ref 6.4–8.3)

## 2017-06-28 LAB — CBC WITH DIFFERENTIAL (CANCER CENTER ONLY)
Basophils Absolute: 0.1 10*3/uL (ref 0.0–0.1)
Basophils Relative: 1 %
Eosinophils Absolute: 0.2 10*3/uL (ref 0.0–0.5)
Eosinophils Relative: 2 %
HEMATOCRIT: 40.4 % (ref 34.8–46.6)
HEMOGLOBIN: 12.8 g/dL (ref 11.6–15.9)
LYMPHS ABS: 1.7 10*3/uL (ref 0.9–3.3)
Lymphocytes Relative: 18 %
MCH: 28 pg (ref 25.1–34.0)
MCHC: 31.7 g/dL (ref 31.5–36.0)
MCV: 88.4 fL (ref 79.5–101.0)
MONOS PCT: 9 %
Monocytes Absolute: 0.9 10*3/uL (ref 0.1–0.9)
NEUTROS ABS: 6.4 10*3/uL (ref 1.5–6.5)
NEUTROS PCT: 70 %
Platelet Count: 288 10*3/uL (ref 145–400)
RBC: 4.57 MIL/uL (ref 3.70–5.45)
RDW: 14.3 % (ref 11.2–14.5)
WBC Count: 9.3 10*3/uL (ref 3.9–10.3)

## 2017-06-29 ENCOUNTER — Encounter: Payer: Self-pay | Admitting: Internal Medicine

## 2017-06-29 NOTE — Patient Instructions (Signed)

## 2017-06-30 ENCOUNTER — Other Ambulatory Visit: Payer: Self-pay

## 2017-06-30 ENCOUNTER — Encounter: Payer: Self-pay | Admitting: *Deleted

## 2017-06-30 ENCOUNTER — Emergency Department
Admission: EM | Admit: 2017-06-30 | Discharge: 2017-06-30 | Disposition: A | Discharge status: Home / Self Care | Payer: Medicare Other | Source: Home / Self Care | Attending: Emergency Medicine | Admitting: Emergency Medicine

## 2017-06-30 DIAGNOSIS — M79642 Pain in left hand: Secondary | ICD-10-CM

## 2017-06-30 DIAGNOSIS — L03012 Cellulitis of left finger: Secondary | ICD-10-CM | POA: Diagnosis not present

## 2017-06-30 MED ORDER — CEPHALEXIN 500 MG PO CAPS: 500.0000 mg | ORAL_CAPSULE | Freq: Three times a day (TID) | ORAL | 0 refills | Status: AC

## 2017-06-30 NOTE — ED Triage Notes (Signed)
Pt c/o LT 2nd finger infection x 3 days. She has applied peroxide and triple ABT ointment.

## 2017-06-30 NOTE — Discharge Instructions (Signed)
Today, we lanced small cuticle infection in left index finger. We sent off for a wound culture. We sent prescription for antibiotic to your pharmacy. Wound care: Warm soaks twice a day, then redress with nonstick dressing. May use small amount of topical antibiotic ointment. Tylenol or ibuprofen as needed for pain. Follow-up here or with your doctor if not improving in 2-3 days, sooner if worse or new symptoms. Please read the attached instruction sheet on paronychia (cuticle infection)

## 2017-06-30 NOTE — ED Provider Notes (Signed)
[facility]    CSN: [id number] Arrival date & time: 06/30/17  1510     History   Chief Complaint Chief Complaint  Patient presents with  . Hand Pain    HPI Brandy Cameron is a 82 y.o. female.  Here with caretaker HPI 3 days of swelling and pain around cuticle left second finger.Has tried peroxide and antibiotic ointment without improvement. no radiation of pain. The pain is moderate, dull.  Denies associated fever or chills or nausea or vomiting. No chest pain or shortness of breath. No new ENT symptoms Past Medical History:  Diagnosis Date  . Asthma   . CAD (coronary artery disease)    a. LHC 9/11 (in setting of NSTEMI): LAD irregs, mLCx 50, pRCA 50, inf-apical AK;  b. Myoview 9/13: Normal stress nuclear study.  LV Ejection Fraction: 71%   . Carotid stenosis    a. Carotid US 9/13: bilat 0-39%;  b. Carotid US 7/15: no sig stenosis  . Chronic diastolic CHF (congestive heart failure) ([place])    a. Echo 7/15: Mild LVH, mild focal basal septal hypertrophy, EF 50-55%, no RWMA, Gr 1 DD  . COPD (chronic obstructive pulmonary disease) ([place])   . HTN (hypertension)   . Lung cancer [facility])    [name]  Marland Kitchen NSTEMI (non-ST elevated myocardial infarction) ([place])    with only mild to moderate CAD in September of 2011,There was concern for apival ballooning. >> ? Tako-Tsubo syndrome vs vasospasm    Patient Active Problem List   Diagnosis Date Noted  . Lower abdominal pain 06/27/2017  . Intractable vomiting with nausea 06/13/2017  . Chronic respiratory failure ([place]) 10/14/2015  . Hyperglycemia 10/07/2015  . Menopause ovarian failure 10/07/2015  . Encounter for colorectal cancer screening 10/07/2015  . COPD (chronic obstructive pulmonary disease) ([place]) 08/29/2015  . GERD (gastroesophageal reflux disease) 08/29/2015  . Cerebrovascular disease 08/08/2013  . ASTHMA 12/26/2010  . CAD (coronary  artery disease) 11/11/2010  . Hypertension 11/11/2010  . Hyperlipidemia with target LDL less than 70 11/11/2010  . Primary cancer of left upper lobe of lung ([place]) 11/11/2010    Past Surgical History:  Procedure Laterality Date  . CATARACT SURGERY    . FOOT SURGERY    . GALLBLADDER SURGERY    . KNEE SURGERY    . LUNG LOBECTOMY    . VESICOVAGINAL FISTULA CLOSURE W/ TAH      OB History    No data available       Home Medications    Prior to Admission medications   Medication Sig Start Date End Date Taking? Authorizing Provider  albuterol (PROVENTIL) (2.5 MG/3ML) 0.083% nebulizer solution Take 3 mLs (2.5 mg total) by nebulization every 4 (four) hours as needed for wheezing or shortness of breath. 07/08/16   [name], MD  aspirin 81 MG tablet Take 81 mg by mouth daily.      [provider]  budesonide-formoterol (SYMBICORT) 160-4.5 MCG/ACT inhaler Inhale 2 puffs into the lungs 2 (two) times daily. 10/29/16   [name], MD  calcium carbonate 1250 MG capsule Take 1,250 mg by mouth daily.     [provider]  cephALEXin (KEFLEX) 500 MG capsule Take 1 capsule (500 mg total) by mouth 3 (three) times daily for 7 days. Take with food 06/30/17 07/07/17  Jacqulyn Cane, MD  Cholecalciferol (VITAMIN D3 PO) Take by mouth daily.  [provider]  furosemide (LASIX) 40 MG tablet Take 2 tablets (80 mg total) by mouth daily. 09/10/16   Lelon Perla, MD  losartan (COZAAR) 100 MG tablet Take 1 tablet (100 mg total) by mouth daily. 05/20/17   [name], MD  metoprolol tartrate (LOPRESSOR) 25 MG tablet Take 1 tablet (25 mg total) by mouth 2 (two) times daily. 05/25/17   [name], MD  nitroGLYCERIN (NITROSTAT) 0.4 MG SL tablet Place 1 tablet (0.4 mg total) under the tongue every 5 (five) minutes as needed for chest pain. 10/25/16 01/23/17  Lelon Perla, MD  Omega-3 Fatty Acids (FISH OIL PO) Take by mouth daily.    [provider]  OXYGEN  Inhale 2 L into the lungs. 2 liters continuous    [provider]  potassium chloride SA (K-DUR,KLOR-CON) 20 MEQ tablet TAKE 1 TABLET BY MOUTH DAILY 06/08/17   [name], MD  Spacer/Aero-Holding Chambers (AEROCHAMBER MV) inhaler Use as instructed 08/29/15   [name], MD    Family History Family History  Problem Relation Age of Onset  . Heart disease Mother 23  . Asthma Mother   . Diabetes Mother   . Cancer Mother        Salivary  . Emphysema Mother   . Heart disease Unknown        POSITIVE FAMILY HX. OF   . Pancreatitis Unknown        QUESTIONABLE FROM ACE INHIBITORS  . COPD Sister     Social History Social History   Tobacco Use  . Smoking status: Former Smoker    Packs/day: 1.50    Years: 42.00    Pack years: 63.00    Types: Cigarettes    Last attempt to quit: 04/26/2006    Years since quitting: 11.1  . Smokeless tobacco: Never Used  Substance Use Topics  . Alcohol use: No    Alcohol/week: 0.0 oz  . Drug use: No     Allergies   Augmentin [amoxicillin-pot clavulanate]; Sulfa antibiotics; Tape; Amlodipine; Pantoprazole; and Sulfonamide derivatives   Review of Systems Review of Systems  All other systems reviewed and are negative.    Physical Exam Triage Vital Signs ED Triage Vitals  Enc Vitals Group     BP 06/30/17 1537 132/80     Pulse Rate 06/30/17 1537 99     Resp 06/30/17 1537 18     Temp 06/30/17 1537 97.6 F (36.4 C)     Temp Source 06/30/17 1537 Oral     SpO2 06/30/17 1537 96 %     Weight 06/30/17 1538 216 lb (98 kg)     Height 06/30/17 1538 5\' 11"  (1.803 m)     Head Circumference --      Peak Flow --      Pain Score 06/30/17 1538 7     Pain Loc --      Pain Edu? --      Excl. in [place]? --    No data found.  Updated Vital Signs BP 132/80 (BP Location: Right Arm)   Pulse 99   Temp 97.6 F (36.4 C) (Oral)   Resp 18   Ht 5\' 11"  (1.803 m)   Wt 216 lb (98 kg)   SpO2 96%   BMI 30.13 kg/m   Visual Acuity Right Eye  Distance:   Left Eye Distance:   Bilateral Distance:    Right Eye Near:   Left Eye Near:    Bilateral  Near:     Physical Exam  Constitutional: She is oriented to person, place, and time. She appears well-developed and well-nourished. No distress.  HENT:  Head: Normocephalic and atraumatic.  Eyes: Pupils are equal, round, and reactive to light. No scleral icterus.  Neck: Normal range of motion. Neck supple.  Cardiovascular: Normal rate and regular rhythm.  Pulmonary/Chest: Effort normal.  Abdominal: She exhibits no distension.  Neurological: She is alert and oriented to person, place, and time. No cranial nerve deficit.  Skin: Skin is warm and dry.  Psychiatric: She has a normal mood and affect. Her behavior is normal.  Vitals reviewed.  Left second finger: Red, swollen, warm, tender, fluctuant dorsum left distal second finger at the cuticle. No red streaks. Neurovascular intact  UC Treatments / Results  Labs (all labs ordered are listed, but only abnormal results are displayed) Labs Reviewed  WOUND CULTURE    EKG  EKG Interpretation None       Radiology No results found.  Procedures Incision and Drainage Date/Time: 07/03/2017 11:49 PM Performed by: Jacqulyn Cane, MD Authorized by: Jacqulyn Cane, MD   Consent:    Consent obtained:  Verbal   Consent given by:  Patient and healthcare agent   Risks discussed:  Bleeding, incomplete drainage, infection and pain   Alternatives discussed:  No treatment, delayed treatment, alternative treatment, observation and referral Location:    Type:  Abscess (paronychia)   Size:  1 cm   Location:  Upper extremity   Upper extremity location:  Finger   Finger location:  L index finger Pre-procedure details:    Skin preparation:  Antiseptic wash and Betadine Anesthesia (see MAR for exact dosages):    Anesthesia method:  None (patient declined) Procedure type:    Complexity:  Simple Procedure details:    Incision types:  Stab  incision   Incision depth:  Dermal   Scalpel blade:  11   Drainage:  Purulent   Drainage amount:  Moderate   Wound treatment:  Wound left open Post-procedure details:    Patient tolerance of procedure:  Tolerated well, no immediate complications Comments:     Nonstick dressing applied. Wound after care discussed. Wound culture sent Follow-up here or with PCP if no better in 5 days, sooner if worse or new symptoms.   (including critical care time)  Medications Ordered in UC Medications - No data to display   Initial Impression / Assessment and Plan / UC Course  I have reviewed the triage vital signs and the nursing notes.  Pertinent labs & imaging results that were available during my care of the patient were reviewed by me and considered in my medical decision making (see chart for details).  An After Visit Summary was printed and given to the patient. And sister, who lives with her Today, we lanced small cuticle infection in left index finger. We sent off for a wound culture. We sent prescription for antibiotic to your pharmacy. Wound care: Warm soaks twice a day, then redress with nonstick dressing. May use small amount of topical antibiotic ointment. Tylenol or ibuprofen as needed for pain. Follow-up here or with your doctor if not improving in 2-3 days, sooner if worse or new symptoms. Please read the attached instruction sheet on paronychia (cuticle infection)       Final Clinical Impressions(s) / UC Diagnoses   Final diagnoses:  Paronychia of left index finger    ED Discharge Orders        Ordered  cephALEXin (KEFLEX) 500 MG capsule  3 times daily     06/30/17 1600    for 7 days. She declined prescription pain med that may use over otc Tylenol or ibuprofen   Controlled Substance Prescriptions [place] Controlled Substance Registry consulted? Not Applicable   Jacqulyn Cane, MD 07/03/17 2352

## 2017-07-01 ENCOUNTER — Other Ambulatory Visit: Payer: Self-pay | Admitting: Internal Medicine

## 2017-07-01 ENCOUNTER — Other Ambulatory Visit: Payer: Self-pay | Admitting: *Deleted

## 2017-07-01 DIAGNOSIS — I10 Essential (primary) hypertension: Secondary | ICD-10-CM

## 2017-07-01 DIAGNOSIS — I2510 Atherosclerotic heart disease of native coronary artery without angina pectoris: Secondary | ICD-10-CM

## 2017-07-01 MED ORDER — LOSARTAN POTASSIUM 100 MG PO TABS: 100.0000 mg | ORAL_TABLET | Freq: Every day | ORAL | 0 refills | Status: DC

## 2017-07-01 MED ORDER — FUROSEMIDE 40 MG PO TABS: 80.0000 mg | ORAL_TABLET | Freq: Every day | ORAL | 3 refills | Status: DC

## 2017-07-01 MED ORDER — METOPROLOL TARTRATE 25 MG PO TABS: 25.0000 mg | ORAL_TABLET | Freq: Two times a day (BID) | ORAL | 1 refills | Status: DC

## 2017-07-01 MED ORDER — POTASSIUM CHLORIDE CRYS ER 20 MEQ PO TBCR: 20.0000 meq | EXTENDED_RELEASE_TABLET | Freq: Every day | ORAL | 0 refills | Status: DC

## 2017-07-01 NOTE — Telephone Encounter (Signed)
Copied from [place] [id number]. Topic: Quick Communication - Rx Refill/Question >> Jul 01, 2017  2:39 PM Synthia Innocent wrote: Medication: losartan (COZAAR) 100 MG tablet , metoprolol tartrate (LOPRESSOR) 25 MG tablet and potassium chloride SA (K-DUR,KLOR-CON) 20 MEQ tablet    Has the patient contacted their pharmacy? Yes.     (Agent: If no, request that the patient contact the pharmacy for the refill.)   Preferred Pharmacy (with phone number or street name): [facility] Fax # [phone]   Agent: Please be advised that RX refills may take up to 3 business days. We ask that you follow-up with your pharmacy.

## 2017-07-01 NOTE — Telephone Encounter (Signed)
LOV 04/27/17  Dr. Ronnald Ramp  Unable to locate the pharmacy to send these to:  [facility] [phone]  [facility] unable to fax Rx.  Thanks.

## 2017-07-01 NOTE — Telephone Encounter (Signed)
[facility] sent.

## 2017-07-03 ENCOUNTER — Telehealth: Payer: Self-pay | Admitting: Emergency Medicine

## 2017-07-03 DIAGNOSIS — L03012 Cellulitis of left finger: Secondary | ICD-10-CM

## 2017-07-03 LAB — WOUND CULTURE
MICRO NUMBER:: 90295521
SPECIMEN QUALITY:: ADEQUATE

## 2017-07-03 NOTE — Telephone Encounter (Signed)
Patient states finger improving but still some redness around nail bed. Will notify Dr.Massey since wound culture revealed yeast.

## 2017-07-03 NOTE — Telephone Encounter (Signed)
In my opinion, the small amount of yeast from the culture is not significant and I would not treat with anti-yeast medicine. As she is improving on the antibiotic, I would advise continuing with the antibiotic, but she should follow-up with her doctor for recheck within the next 2-3 days.

## 2017-07-03 NOTE — Telephone Encounter (Signed)
Left VM that no other rx is needed; follow with her PCP.

## 2017-07-04 ENCOUNTER — Telehealth: Payer: Self-pay

## 2017-07-04 ENCOUNTER — Inpatient Hospital Stay (HOSPITAL_BASED_OUTPATIENT_CLINIC_OR_DEPARTMENT_OTHER): Payer: Medicare Other | Admitting: Nurse Practitioner

## 2017-07-04 ENCOUNTER — Encounter: Payer: Self-pay | Admitting: Nurse Practitioner

## 2017-07-04 VITALS — BP 120/56 | HR 84 | Temp 98.0°F | Resp 20 | Ht 71.0 in | Wt 214.8 lb

## 2017-07-04 DIAGNOSIS — C3412 Malignant neoplasm of upper lobe, left bronchus or lung: Secondary | ICD-10-CM

## 2017-07-04 NOTE — Telephone Encounter (Signed)
Printed avs and calender of upcoming appointment. Per 3/11 los

## 2017-07-04 NOTE — Progress Notes (Addendum)
[place] OFFICE PROGRESS NOTE   DIAGNOSIS: recurrent non-small cell lung cancer, adenocarcinoma with positive PDL 1 expression (60%) with left upper lobe as well as right upper lobe pulmonary nodules. She was initially diagnosed with a stage IA (T1a, N0, M0) in in October 2010.   Molecular studies [employer] one): Genomic Alterations Identified? KRAS Q61L RBM10 [id number]* Additional Findings? Microsatellite status MS-Stable Tumor Mutation Burden Cannot Be Determined Additional Disease-relevant Genes with No Reportable Alterations Identified? EGFR ALK BRAF MET RET ERBB2 ROS1  PRIOR THERAPY: 1) status post wedge resection with seed implants.    CURRENT THERAPY: Observation.      INTERVAL HISTORY:   Brandy Cameron was last seen at the [facility] 08/17/2016.  She was scheduled to return at a 97-monthinterval with a CT scan of the chest but decided to cancel this because there was no evidence of progression on multiple other scans.  She reports increased dyspnea on exertion.  She has an occasional cough.  No hemoptysis.  She continue supplemental oxygen.  She describes her appetite as "okay".  She has had some weight loss.  She denies pain.  Over the past month she notes occasional vomiting.  Objective:  Vital signs in last 24 hours:  Blood pressure (!) 120/56, pulse 84, temperature 98 F (36.7 C), temperature source Oral, resp. rate 20, height '5\' 11"'$  (1.803 m), weight 214 lb 12.8 oz (97.4 kg), SpO2 98 %.    HEENT: No thrush or ulcers. Lymphatics: No palpable cervical or supraclavicular lymph nodes. Resp: Distant breath sounds.  No respiratory distress. Cardio: Regular rate and rhythm. GI: Abdomen soft and nontender.  No hepatomegaly. Vascular: No leg edema. Neuro: Alert and oriented.   Lab Results:  Lab Results  Component Value Date   WBC 9.3 06/28/2017   HGB 13.3 06/13/2017   HCT 40.4 06/28/2017   MCV 88.4 06/28/2017   PLT 288 06/28/2017   NEUTROABS 6.4 06/28/2017    Imaging:  No results found.  Medications: I have reviewed the patient's current medications.  Assessment/Plan: 1. Recurrent non-small cell lung cancer, adenocarcinoma, diagnosed in 2010 as stage IA status post left upper lobe wedge resection with seed implants, on observation since that time; positive PDL 1 expression (60%).  Restaging chest CT 06/29/2017 shows evidence of progression with enlarging masslike consolidation in the left upper lobe.  Also noted was new thickening of the lateral limb left adrenal gland. 2. COPD.  Disposition: Dr. MJulien Nordmannreviewed the CT report/images with Brandy Cameron at today's visit.  She understands the findings are concerning for progression of the lung cancer.  Dr. MJulien Nordmannrecommends a PET scan.  We made that referral at today's visit.  She will return for a follow-up visit in approximately 2 weeks to review those results and discuss treatment options.  Patient seen with Dr. MJulien Nordmann    LNed CardANP/GNP-BC   07/04/2017  11:50 AM  ADDENDUM: Hematology/Oncology Attending: I had a face-to-face encounter with the patient.  I recommended her care plan.  This is a very pleasant 82years old white female with recurrent non-small cell lung cancer that was initially diagnosed in 2010 as a stage Ia status post left upper lobe wedge resection with seed implants.  The patient had evidence for disease recurrence and she was treated with palliative radiotherapy.  She has been in observation and she missed a few follow-up visit.  She presented today for reevaluation after repeating CT scan of the chest that showed concerning findings for disease  progression with enlargement of the masslike consolidation in the left upper lobe as well as new thickening in the left adrenal gland suspicious for metastatic disease.  I personally and independently reviewed the scan images and discussed the results with the patient and showed her the images today.  I  recommended for the patient to have a repeat PET scan for further evaluation of her disease and to rule out disease recurrence or metastasis.  I will see her back for follow-up visit in 2 weeks for reevaluation and discussion of her treatment options based on the PET scan results. The patient was advised to call immediately if she has any concerning symptoms in the interval.  Disclaimer: This note was dictated with voice recognition software. Similar sounding words can inadvertently be transcribed and may be missed upon review. Brandy Kempf, MD 07/05/17

## 2017-07-06 ENCOUNTER — Other Ambulatory Visit: Payer: Self-pay | Admitting: Nurse Practitioner

## 2017-07-06 DIAGNOSIS — B351 Tinea unguium: Secondary | ICD-10-CM | POA: Diagnosis not present

## 2017-07-06 DIAGNOSIS — L600 Ingrowing nail: Secondary | ICD-10-CM | POA: Diagnosis not present

## 2017-07-07 ENCOUNTER — Other Ambulatory Visit: Payer: Medicare Other

## 2017-07-07 ENCOUNTER — Encounter: Payer: Self-pay | Admitting: Neurology

## 2017-07-07 ENCOUNTER — Other Ambulatory Visit: Payer: Self-pay

## 2017-07-07 ENCOUNTER — Ambulatory Visit: Payer: Medicare Other | Admitting: Neurology

## 2017-07-07 VITALS — BP 126/70 | HR 83 | Ht 61.0 in | Wt 213.0 lb

## 2017-07-07 DIAGNOSIS — R413 Other amnesia: Secondary | ICD-10-CM

## 2017-07-07 MED ORDER — DONEPEZIL HCL 5 MG PO TABS: ORAL_TABLET | ORAL | 11 refills | Status: DC

## 2017-07-07 NOTE — Progress Notes (Signed)
NEUROLOGY CONSULTATION NOTE  Brandy Cameron MRN: [id number] DOB: 11/06/1935  Referring provider: Dr. [name] Primary care provider: Dr. [name]  Reason for consult:  Cognitive decline  Dear Dr Ronnald Ramp:  Thank you for your kind referral of Brandy Cameron for consultation of the above symptoms. Although her history is well known to you, please allow me to reiterate it for the purpose of our medical record. She is alone in the office today. Records and images were personally reviewed where available.  HISTORY OF PRESENT ILLNESS: This is a pleasant 82 year old right-handed woman with a history of hypertension, CAD, lung cancer, COPD, presenting for evaluation of worsening memory. She started noticing memory changes a couple of months ago. She had been living with her sister for 3 years and reports this was a very stressful situation for her. She moved in with her daughter last week. She noticed she would be more forgetful, occasionally forgetting her medications. Her daughter got her a pillbox which helps. She lost her car keys one time but otherwise states she does not frequently misplace things. She denies leaving the stove on. She continues to drive and denies getting lost. She denies any missed bill payments. She was noted to have some word-finding difficulties in the office today. She denies any difficulties with dressing/bathing independently. Her brother had dementia. She denies any history of significant head injuries or alcohol use.  She had a 15 minute episode of right hand weakness and numbness 3 weeks ago, she could not pick up anything.  No neck pain. She has occasional headaches, some occasional dizziness when standing. She has occasional low back pain. She denies any diplopia, dysarthria/dysphagia, bowel dysfunction, anosmia. She has mild hand tremors. Occasional constipation.   Laboratory Data: Lab Results  Component Value Date   TSH 2.74 04/27/2017    PAST  MEDICAL HISTORY: Past Medical History:  Diagnosis Date  . Asthma   . CAD (coronary artery disease)    a. LHC 9/11 (in setting of NSTEMI): LAD irregs, mLCx 50, pRCA 50, inf-apical AK;  b. Myoview 9/13: Normal stress nuclear study.  LV Ejection Fraction: 71%   . Carotid stenosis    a. Carotid US 9/13: bilat 0-39%;  b. Carotid US 7/15: no sig stenosis  . Chronic diastolic CHF (congestive heart failure) ([place])    a. Echo 7/15: Mild LVH, mild focal basal septal hypertrophy, EF 50-55%, no RWMA, Gr 1 DD  . COPD (chronic obstructive pulmonary disease) ([place])   . HTN (hypertension)   . Lung cancer [facility])    [name]  Marland Kitchen NSTEMI (non-ST elevated myocardial infarction) ([place])    with only mild to moderate CAD in September of 2011,There was concern for apival ballooning. >> ? Tako-Tsubo syndrome vs vasospasm    PAST SURGICAL HISTORY: Past Surgical History:  Procedure Laterality Date  . CATARACT SURGERY    . FOOT SURGERY    . GALLBLADDER SURGERY    . KNEE SURGERY    . LUNG LOBECTOMY    . VESICOVAGINAL FISTULA CLOSURE W/ TAH      MEDICATIONS: Current Outpatient Medications on File Prior to Visit  Medication Sig Dispense Refill  . albuterol (PROVENTIL) (2.5 MG/3ML) 0.083% nebulizer solution Take 3 mLs (2.5 mg total) by nebulization every 4 (four) hours as needed for wheezing or shortness of breath. 120 mL 3  . aspirin 81 MG tablet Take 81  mg by mouth daily.      . budesonide-formoterol (SYMBICORT) 160-4.5 MCG/ACT inhaler Inhale 2 puffs into the lungs 2 (two) times daily. 1 Inhaler 6  . calcium carbonate 1250 MG capsule Take 1,250 mg by mouth daily.     . cephALEXin (KEFLEX) 500 MG capsule Take 1 capsule (500 mg total) by mouth 3 (three) times daily for 7 days. Take with food 21 capsule 0  . Cholecalciferol (VITAMIN D3 PO) Take by mouth daily.    . furosemide (LASIX) 40 MG tablet Take 2 tablets (80 mg total) by mouth  daily. 180 tablet 3  . losartan (COZAAR) 100 MG tablet Take 1 tablet (100 mg total) by mouth daily. 90 tablet 0  . metoprolol tartrate (LOPRESSOR) 25 MG tablet Take 1 tablet (25 mg total) by mouth 2 (two) times daily. 180 tablet 1  . Omega-3 Fatty Acids (FISH OIL PO) Take by mouth daily.    . OXYGEN Inhale 2 L into the lungs. 2 liters continuous    . potassium chloride SA (K-DUR,KLOR-CON) 20 MEQ tablet Take 1 tablet (20 mEq total) by mouth daily. 90 tablet 0  . Spacer/Aero-Holding Chambers (AEROCHAMBER MV) inhaler Use as instructed 1 each 0   No current facility-administered medications on file prior to visit.     ALLERGIES: Allergies  Allergen Reactions  . Augmentin [Amoxicillin-Pot Clavulanate] Diarrhea  . Sulfa Antibiotics Rash and Shortness Of Breath  . Tape Other (See Comments)  . Amlodipine   . Pantoprazole Nausea And Vomiting  . Sulfonamide Derivatives Rash    FAMILY HISTORY: Family History  Problem Relation Age of Onset  . Heart disease Mother 45  . Asthma Mother   . Diabetes Mother   . Cancer Mother        Salivary  . Emphysema Mother   . Heart disease Unknown        POSITIVE FAMILY HX. OF   . Pancreatitis Unknown        QUESTIONABLE FROM ACE INHIBITORS  . COPD Sister   . Dementia Brother   . Stroke Brother     SOCIAL HISTORY: Social History   Socioeconomic History  . Marital status: Divorced    Spouse name: Not on file  . Number of children: Not on file  . Years of education: Not on file  . Highest education level: Not on file  Social Needs  . Financial resource strain: Not on file  . Food insecurity - worry: Not on file  . Food insecurity - inability: Not on file  . Transportation needs - medical: Not on file  . Transportation needs - non-medical: Not on file  Occupational History  . Not on file  Tobacco Use  . Smoking status: Former Smoker    Packs/day: 1.50    Years: 42.00    Pack years: 63.00    Types: Cigarettes    Last attempt to quit:  04/26/2006    Years since quitting: 11.2  . Smokeless tobacco: Never Used  Substance and Sexual Activity  . Alcohol use: No    Alcohol/week: 0.0 oz  . Drug use: No  . Sexual activity: No  Other Topics Concern  . Not on file  Social History Narrative   Originally from [place]. Always lived in [place]. Prior travel to [facility]. 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 Has a dog at home. No birds, mold, or hot tub exposure. No indoor plants. No carpet in her home. Does have draperies.  REVIEW OF SYSTEMS: Constitutional: No fevers, chills, or sweats, no generalized fatigue, change in appetite Eyes: No visual changes, double vision, eye pain Ear, nose and throat: No hearing loss, ear pain, nasal congestion, sore throat Cardiovascular: No chest pain, palpitations Respiratory:  No shortness of breath at rest or with exertion, wheezes GastrointestinaI: No nausea, vomiting, diarrhea, abdominal pain, fecal incontinence Genitourinary:  No dysuria, urinary retention or frequency Musculoskeletal:  No neck pain, +back pain Integumentary: No rash, pruritus, skin lesions Neurological: as above Psychiatric: No depression, insomnia, anxiety Endocrine: No palpitations, fatigue, diaphoresis, mood swings, change in appetite, change in weight, increased thirst Hematologic/Lymphatic:  No anemia, purpura, petechiae. Allergic/Immunologic: no itchy/runny eyes, nasal congestion, recent allergic reactions, rashes  PHYSICAL EXAM: Vitals:   07/07/17 1011  BP: 126/70  Pulse: 83  SpO2: 96%   General: No acute distress, on O2 via [place] Head:  Normocephalic/atraumatic Eyes: Fundoscopic exam shows bilateral sharp discs, no vessel changes, exudates, or hemorrhages Neck: supple, no paraspinal tenderness, full range of motion Back: No paraspinal tenderness Heart: regular rate and rhythm Lungs: Clear to auscultation bilaterally. Vascular: No carotid bruits. Skin/Extremities: No rash, no  edema Neurological Exam: Mental status: alert and oriented to person, place, and time, no dysarthria or aphasia, Fund of knowledge is appropriate.  Remote memory intact.  Attention and concentration are normal.    Able to name objects and repeat phrases.CDT 5/5 MMSE - Mini Mental State Exam 07/07/2017  Orientation to time 4  Orientation to Place 5  Registration 3  Attention/ Calculation 5  Recall 1  Language- name 2 objects 2  Language- repeat 1  Language- follow 3 step command 3  Language- read & follow direction 1  Write a sentence 1  Copy design 1  Total score 27   Cranial nerves: CN I: not tested CN II: pupils equal, round and reactive to light, visual fields intact, fundi unremarkable. CN III, IV, VI:  full range of motion, no nystagmus, no ptosis CN V: decreased pin on right V2 CN VII: upper and lower face symmetric CN VIII: hearing intact to finger rub CN IX, X: gag intact, uvula midline CN XI: sternocleidomastoid and trapezius muscles intact CN XII: tongue midline Bulk & Tone: normal, no fasciculations. Motor: 5/5 throughout with no pronator drift. Sensation: decreased pin and cold on right LE, intact to light touch, cold, pin on both UE, intact vibration and joint position sense. Romberg test negative Deep Tendon Reflexes: +2 throughout, no ankle clonus Plantar responses: downgoing bilaterally Cerebellar: no incoordination on finger to nose, heel to shin. No dysdiadochokinesia Gait: narrow-based and steady, able to tandem walk adequately. Tremor: none  IMPRESSION: This is a pleasant 82 year old right-handed woman with a history of hypertension, CAD, lung cancer, COPD, presenting for evaluation of worsening memory. She also reports a transient episode of right hand weakness/numbness 3 weeks ago. Her neurological exam today shows some decreased sensation on the right face and leg, no focal weakness. MMSE today normal 27/30. Symptoms suggestive of mild cognitive impairment.  TSH normal, check B12 level. MRI brain without contrast will be ordered to assess for underlying structural abnormality and assess vascular load. She may benefit from starting a cholinesterase inhibitor, start Donepezil 5mg  1/2 tablet daily for 2 weeks, then increase to 1 tablet daily. Side effects and expectations from the medication were discussed. We discussed the importance of control of vascular risk factors, physical exercise, and brain stimulation exercises for brain health. Continue daily aspirin. She knows to go to  the ER immediately for any sudden change in symptoms. She will follow-up in 6 months and knows to call for any changes.   Thank you for allowing me to participate in the care of this patient. Please do not hesitate to call for any questions or concerns.   Ellouise Newer, M.D.  CC: Dr. Ronnald Ramp

## 2017-07-07 NOTE — Patient Instructions (Addendum)
1. Bloodwork for B12  Your provider has requested that you have labwork completed today. Please go to [facility] ([id number]) on the second floor of this building before leaving the office today. You do not need to check in. If you are not called within 15 minutes please check with the front desk.   2. Schedule MRI brain without contrast  We have sent a referral to [facility] for your MRI and they will call you directly to schedule your appt. They are located at [place]. If you need to contact them directly please call [phone].   3. Start Donepezil 5mg : Take 1/2 tablet daily for the first 2 weeks, then increase to 1 tablet daily 4. Follow-up in 6 months, call for any changes  RECOMMENDATIONS FOR ALL PATIENTS WITH MEMORY PROBLEMS: 1. Continue to exercise (Recommend 30 minutes of walking everyday, or 3 hours every week) 2. Increase social interactions - continue going to [place] and enjoy social gatherings with friends and family 3. Eat healthy, avoid fried foods and eat more fruits and vegetables 4. Maintain adequate blood pressure, blood sugar, and blood cholesterol level. Reducing the risk of stroke and cardiovascular disease also helps promoting better memory. 5. Avoid stressful situations. Live a simple life and avoid aggravations. Organize your time and prepare for the next day in anticipation. 6. Sleep well, avoid any interruptions of sleep and avoid any distractions in the bedroom that may interfere with adequate sleep quality 7. Avoid sugar, avoid sweets as there is a strong link between excessive sugar intake, diabetes, and cognitive impairment We discussed the Mediterranean diet, which has been shown to help patients reduce the risk of progressive memory disorders and reduces cardiovascular risk. This includes eating fish, eat fruits and green leafy vegetables, nuts like almonds and hazelnuts, walnuts, and also use olive oil. Avoid fast foods and fried  foods as much as possible. Avoid sweets and sugar as sugar use has been linked to worsening of memory function.

## 2017-07-08 ENCOUNTER — Telehealth: Payer: Self-pay

## 2017-07-08 LAB — VITAMIN B12: VITAMIN B 12: 580 pg/mL (ref 200–1100)

## 2017-07-08 LAB — TIQ-NTM

## 2017-07-08 NOTE — Telephone Encounter (Signed)
-----   Message from Cameron Sprang, MD sent at 07/08/2017 12:14 PM EDT ----- Pls let her know B12 level is normal, thanks

## 2017-07-08 NOTE — Telephone Encounter (Signed)
Spoke with pt relaying message below.   

## 2017-07-11 ENCOUNTER — Telehealth: Payer: Self-pay | Admitting: Medical Oncology

## 2017-07-11 DIAGNOSIS — Z85118 Personal history of other malignant neoplasm of bronchus and lung: Secondary | ICD-10-CM | POA: Diagnosis not present

## 2017-07-11 DIAGNOSIS — R0602 Shortness of breath: Secondary | ICD-10-CM | POA: Diagnosis not present

## 2017-07-11 DIAGNOSIS — J449 Chronic obstructive pulmonary disease, unspecified: Secondary | ICD-10-CM | POA: Diagnosis not present

## 2017-07-11 NOTE — Telephone Encounter (Signed)
Returned call to  r/s 3/26 appt at 3pm. --pt  needs 345 appt when dtr can come with her.

## 2017-07-11 NOTE — Telephone Encounter (Signed)
err

## 2017-07-11 NOTE — Telephone Encounter (Signed)
dtr notified.

## 2017-07-11 NOTE — Telephone Encounter (Signed)
She can come a little bit late.

## 2017-07-12 ENCOUNTER — Telehealth: Payer: Self-pay

## 2017-07-12 DIAGNOSIS — I10 Essential (primary) hypertension: Secondary | ICD-10-CM

## 2017-07-12 DIAGNOSIS — I2510 Atherosclerotic heart disease of native coronary artery without angina pectoris: Secondary | ICD-10-CM

## 2017-07-12 MED ORDER — LOSARTAN POTASSIUM 100 MG PO TABS: 100.0000 mg | ORAL_TABLET | Freq: Every day | ORAL | 0 refills | Status: DC

## 2017-07-12 MED ORDER — METOPROLOL TARTRATE 25 MG PO TABS: 25.0000 mg | ORAL_TABLET | Freq: Two times a day (BID) | ORAL | 1 refills | Status: AC

## 2017-07-12 MED ORDER — POTASSIUM CHLORIDE CRYS ER 20 MEQ PO TBCR: 20.0000 meq | EXTENDED_RELEASE_TABLET | Freq: Every day | ORAL | 1 refills | Status: DC

## 2017-07-12 NOTE — Telephone Encounter (Signed)
[facility] sent rx request for potassium, metoprolol and losartan. erx sent.

## 2017-07-13 ENCOUNTER — Telehealth: Payer: Self-pay | Admitting: Cardiology

## 2017-07-13 ENCOUNTER — Ambulatory Visit (HOSPITAL_COMMUNITY)
Admission: RE | Admit: 2017-07-13 | Discharge: 2017-07-13 | Disposition: A | Discharge status: Home / Self Care | Payer: Medicare Other | Source: Ambulatory Visit | Attending: Nurse Practitioner | Admitting: Nurse Practitioner

## 2017-07-13 DIAGNOSIS — I700 Atherosclerosis of aorta: Secondary | ICD-10-CM | POA: Diagnosis not present

## 2017-07-13 DIAGNOSIS — C7972 Secondary malignant neoplasm of left adrenal gland: Secondary | ICD-10-CM | POA: Diagnosis not present

## 2017-07-13 DIAGNOSIS — C7971 Secondary malignant neoplasm of right adrenal gland: Secondary | ICD-10-CM | POA: Insufficient documentation

## 2017-07-13 DIAGNOSIS — M899 Disorder of bone, unspecified: Secondary | ICD-10-CM | POA: Diagnosis not present

## 2017-07-13 DIAGNOSIS — C3412 Malignant neoplasm of upper lobe, left bronchus or lung: Secondary | ICD-10-CM | POA: Insufficient documentation

## 2017-07-13 DIAGNOSIS — C7989 Secondary malignant neoplasm of other specified sites: Secondary | ICD-10-CM | POA: Insufficient documentation

## 2017-07-13 DIAGNOSIS — C7889 Secondary malignant neoplasm of other digestive organs: Secondary | ICD-10-CM | POA: Insufficient documentation

## 2017-07-13 DIAGNOSIS — C771 Secondary and unspecified malignant neoplasm of intrathoracic lymph nodes: Secondary | ICD-10-CM | POA: Diagnosis not present

## 2017-07-13 DIAGNOSIS — I2510 Atherosclerotic heart disease of native coronary artery without angina pectoris: Secondary | ICD-10-CM | POA: Insufficient documentation

## 2017-07-13 DIAGNOSIS — K5730 Diverticulosis of large intestine without perforation or abscess without bleeding: Secondary | ICD-10-CM | POA: Insufficient documentation

## 2017-07-13 DIAGNOSIS — C786 Secondary malignant neoplasm of retroperitoneum and peritoneum: Secondary | ICD-10-CM | POA: Diagnosis not present

## 2017-07-13 LAB — GLUCOSE, CAPILLARY: Glucose-Capillary: 130 mg/dL — ABNORMAL HIGH (ref 65–99)

## 2017-07-13 MED ORDER — FLUDEOXYGLUCOSE F - 18 (FDG) INJECTION
10.7000 | Freq: Once | INTRAVENOUS | Status: AC | PRN
  Administered 2017-07-13: 10.7 via INTRAVENOUS

## 2017-07-13 MED ORDER — FUROSEMIDE 40 MG PO TABS: 80.0000 mg | ORAL_TABLET | Freq: Every day | ORAL | 1 refills | Status: DC

## 2017-07-13 NOTE — Telephone Encounter (Signed)
°*  STAT* If patient is at the pharmacy, call can be transferred to refill team.   1. Which medications need to be refilled? (please list name of each medication and dose if known) Furosemide 40 mg   2. Which pharmacy/location (including street and city if local pharmacy) is medication to be sent to? [place], [place]  3. Do they need a 30 day or 90 day supply? 90 day

## 2017-07-14 ENCOUNTER — Ambulatory Visit: Payer: Self-pay

## 2017-07-14 ENCOUNTER — Telehealth: Payer: Self-pay | Admitting: Neurology

## 2017-07-14 DIAGNOSIS — Z85118 Personal history of other malignant neoplasm of bronchus and lung: Secondary | ICD-10-CM | POA: Diagnosis not present

## 2017-07-14 DIAGNOSIS — J449 Chronic obstructive pulmonary disease, unspecified: Secondary | ICD-10-CM | POA: Diagnosis not present

## 2017-07-14 DIAGNOSIS — R0602 Shortness of breath: Secondary | ICD-10-CM | POA: Diagnosis not present

## 2017-07-14 NOTE — Telephone Encounter (Signed)
LMOM giving daughter phone number to [place] scheduling department.

## 2017-07-14 NOTE — Telephone Encounter (Signed)
Pt's daughter, Hilda Blades, called stating that she spoke with [facility] and was told that orders were never placed for pt's MRI.    I called [facility] to figure out what is going on.  They were able to see order, which was placed 07/07/17.  Scheduled pt's MRI for Saturday, March 30th @ 9:50am with a 9:30 arrival time.   Returned call to pt's daughter.  No answer. [facility] relaying appointment information.

## 2017-07-14 NOTE — Telephone Encounter (Signed)
Noticed yesterday blood on tissue after wiping and difficulty passing urine. Appointment for tomorrow. Will call back if symptoms worsen. Reason for Disposition . Pain or burning with passing urine  Answer Assessment - Initial Assessment Questions 1. COLOR of URINE: "Describe the color of the urine."  (e.g., tea-colored, pink, red, blood clots, bloody)     Unsure 2. ONSET: "When did the bleeding start?"      Started yesterday 3. EPISODES: "How many times has there been blood in the urine?" or "How many times today?"     When she wipes 4. PAIN with URINATION: "Is there any pain with passing your urine?" If so, ask: "How bad is the pain?"  (Scale 1-10; or mild, moderate, severe)    - MILD - complains slightly about urination hurting    - MODERATE - interferes with normal activities      - SEVERE - excruciating, unwilling or unable to urinate because of the pain      No 5. FEVER: "Do you have a fever?" If so, ask: "What is your temperature, how was it measured, and when did it start?"     No 6. ASSOCIATED SYMPTOMS: "Are you passing urine more frequently than usual?"     Difficulty passing urine 7. OTHER SYMPTOMS: "Do you have any other symptoms?" (e.g., back/flank pain, abdominal pain, vomiting)     No 8. PREGNANCY: "Is there any chance you are pregnant?" "When was your last menstrual period?"     No  Protocols used: URINE - BLOOD IN-A-AH

## 2017-07-14 NOTE — Telephone Encounter (Signed)
Pt's daughter Hilda Blades called and said pt was supposed to be scheduled for an MRI but has not heard anything about scheduling that appointment, please advise  [phone]

## 2017-07-15 ENCOUNTER — Other Ambulatory Visit: Payer: Medicare Other

## 2017-07-15 ENCOUNTER — Ambulatory Visit (INDEPENDENT_AMBULATORY_CARE_PROVIDER_SITE_OTHER): Payer: Medicare Other | Admitting: Family

## 2017-07-15 ENCOUNTER — Encounter: Payer: Self-pay | Admitting: Family

## 2017-07-15 VITALS — BP 136/78 | HR 95 | Temp 98.0°F | Ht 61.0 in | Wt 211.1 lb

## 2017-07-15 DIAGNOSIS — R319 Hematuria, unspecified: Secondary | ICD-10-CM

## 2017-07-15 LAB — POC URINALSYSI DIPSTICK (AUTOMATED)
BILIRUBIN UA: NEGATIVE
Blood, UA: 10
GLUCOSE UA: NEGATIVE
Ketones, UA: NEGATIVE
Leukocytes, UA: NEGATIVE
Nitrite, UA: NEGATIVE
Protein, UA: 1
Urobilinogen, UA: 0.2 E.U./dL
pH, UA: 6 (ref 5.0–8.0)

## 2017-07-15 NOTE — Progress Notes (Signed)
Brandy Cameron is a 82 y.o. female with the following history as recorded in EpicCare:  Patient Active Problem List   Diagnosis Date Noted  . Lower abdominal pain 06/27/2017  . Intractable vomiting with nausea 06/13/2017  . Chronic respiratory failure ([place]) 10/14/2015  . Hyperglycemia 10/07/2015  . Menopause ovarian failure 10/07/2015  . Encounter for colorectal cancer screening 10/07/2015  . COPD (chronic obstructive pulmonary disease) ([place]) 08/29/2015  . GERD (gastroesophageal reflux disease) 08/29/2015  . Cerebrovascular disease 08/08/2013  . ASTHMA 12/26/2010  . CAD (coronary artery disease) 11/11/2010  . Hypertension 11/11/2010  . Hyperlipidemia with target LDL less than 70 11/11/2010  . Primary cancer of left upper lobe of lung ([place]) 11/11/2010    Current Outpatient Medications  Medication Sig Dispense Refill  . albuterol (PROVENTIL) (2.5 MG/3ML) 0.083% nebulizer solution Take 3 mLs (2.5 mg total) by nebulization every 4 (four) hours as needed for wheezing or shortness of breath. 120 mL 3  . aspirin 81 MG tablet Take 81 mg by mouth daily.      . budesonide-formoterol (SYMBICORT) 160-4.5 MCG/ACT inhaler Inhale 2 puffs into the lungs 2 (two) times daily. 1 Inhaler 6  . calcium carbonate 1250 MG capsule Take 1,250 mg by mouth daily.     . Cholecalciferol (VITAMIN D3 PO) Take by mouth daily.    . DOCOSAHEXAENOIC ACID PO Take 1 g by mouth.    . donepezil (ARICEPT) 5 MG tablet Take 1/2 tablet daily for 2 weeks, then increase to 1 tablet daily 30 tablet 11  . donepezil (ARICEPT) 5 MG tablet Take 5 mg by mouth at bedtime.    . furosemide (LASIX) 40 MG tablet Take 2 tablets (80 mg total) by mouth daily. 180 tablet 1  . losartan (COZAAR) 100 MG tablet Take 1 tablet (100 mg total) by mouth daily. 90 tablet 0  . metoprolol tartrate (LOPRESSOR) 25 MG tablet Take 1 tablet (25 mg total) by mouth 2 (two) times daily. 180 tablet 1  . nitroGLYCERIN (NITROSTAT) 0.4 MG SL tablet Place under the  tongue.    . Omega-3 Fatty Acids (FISH OIL PO) Take by mouth daily.    . OXYGEN Inhale 2 L into the lungs. 2 liters continuous    . potassium chloride SA (K-DUR,KLOR-CON) 20 MEQ tablet Take 1 tablet (20 mEq total) by mouth daily. 90 tablet 1  . Spacer/Aero-Holding Chambers (AEROCHAMBER MV) inhaler Use as instructed 1 each 0   No current facility-administered medications for this visit.     Allergies: Augmentin [amoxicillin-pot clavulanate]; Sulfa antibiotics; Tape; Amlodipine; Pantoprazole; and Sulfonamide derivatives  Past Medical History:  Diagnosis Date  . Asthma   . CAD (coronary artery disease)    a. LHC 9/11 (in setting of NSTEMI): LAD irregs, mLCx 50, pRCA 50, inf-apical AK;  b. Myoview 9/13: Normal stress nuclear study.  LV Ejection Fraction: 71%   . Carotid stenosis    a. Carotid US 9/13: bilat 0-39%;  b. Carotid US 7/15: no sig stenosis  . Chronic diastolic CHF (congestive heart failure) ([place])    a. Echo 7/15: Mild LVH, mild focal basal septal hypertrophy, EF 50-55%, no RWMA, Gr 1 DD  . COPD (chronic obstructive pulmonary disease) ([place])   . HTN (hypertension)   . Lung cancer [facility])    [name]  Marland Kitchen NSTEMI (non-ST elevated myocardial infarction) ([place])    with only mild to moderate CAD in September  of 2011,There was concern for apival ballooning. >> ? Tako-Tsubo syndrome vs vasospasm    Past Surgical History:  Procedure Laterality Date  . CATARACT SURGERY    . FOOT SURGERY    . GALLBLADDER SURGERY    . KNEE SURGERY    . LUNG LOBECTOMY    . VESICOVAGINAL FISTULA CLOSURE W/ TAH      Family History  Problem Relation Age of Onset  . Heart disease Mother 8  . Asthma Mother   . Diabetes Mother   . Cancer Mother        Salivary  . Emphysema Mother   . Heart disease Unknown        POSITIVE FAMILY HX. OF   . Pancreatitis Unknown        QUESTIONABLE FROM ACE INHIBITORS  . COPD Sister   .  Dementia Brother   . Stroke Brother     Social History   Tobacco Use  . Smoking status: Former Smoker    Packs/day: 1.50    Years: 42.00    Pack years: 63.00    Types: Cigarettes    Last attempt to quit: 04/26/2006    Years since quitting: 11.2  . Smokeless tobacco: Never Used  Substance Use Topics  . Alcohol use: No    Alcohol/week: 0.0 oz    Subjective:  Patient presents concerns for episode of blood in her urine; saw blood while wiping on one occasion yesterday; denies any burning on urination or frequency of urination; does feel she has urinary frequency; has been having vomiting x 2 months; weight loss noted also; was recently found to have recurrent lung cancer/ PET Scan was done on Wednesday and will be going to oncologist for follow-up next week; knows that she has a "spot" near her kidney and wonders if this could be causing some of the blood as well.    Objective:  Vitals:   07/15/17 1058  BP: 136/78  Pulse: 95  Temp: 98 F (36.7 C)  TempSrc: Oral  SpO2: 98%  Weight: 211 lb 1.3 oz (95.7 kg)  Height: 5\' 1"  (1.549 m)    General: Well developed, well nourished, in no acute distress  Skin : Warm and dry.  Head: Normocephalic and atraumatic  Lungs: Respirations unlabored; clear to auscultation bilaterally without wheeze, rales, rhonchi  Neurologic: Alert and oriented; speech intact; face symmetrical; moves all extremities well; CNII-XII intact without focal deficit   Assessment:  1. Hematuria, unspecified type     Plan:  U/A shows blood; will check urine culture today; am more suspicious that symptoms are related to recurrent cancer; will hold antibiotics today; stressed need to keep follow-up with oncology for next Tuesday and she agrees.   No follow-ups on file.  Orders Placed This Encounter  Procedures  . Urine Culture    Standing Status:   Future    Number of Occurrences:   1    Standing Expiration Date:   07/15/2018  . POCT Urinalysis Dipstick (Automated)     Requested Prescriptions    No prescriptions requested or ordered in this encounter

## 2017-07-17 LAB — URINE CULTURE
MICRO NUMBER: 90362883
SPECIMEN QUALITY: ADEQUATE

## 2017-07-18 ENCOUNTER — Emergency Department (HOSPITAL_COMMUNITY): Payer: Medicare Other

## 2017-07-18 ENCOUNTER — Encounter (HOSPITAL_COMMUNITY): Payer: Self-pay

## 2017-07-18 ENCOUNTER — Other Ambulatory Visit: Payer: Self-pay | Admitting: Family

## 2017-07-18 ENCOUNTER — Other Ambulatory Visit: Payer: Self-pay

## 2017-07-18 ENCOUNTER — Observation Stay (HOSPITAL_COMMUNITY): Payer: Medicare Other

## 2017-07-18 ENCOUNTER — Inpatient Hospital Stay (HOSPITAL_COMMUNITY)
Admission: EM | Admit: 2017-07-18 | Discharge: 2017-07-23 | DRG: 055 | Disposition: A | Discharge status: Home / Self Care | Payer: Medicare Other | Attending: Internal Medicine | Admitting: Internal Medicine

## 2017-07-18 DIAGNOSIS — R918 Other nonspecific abnormal finding of lung field: Secondary | ICD-10-CM | POA: Diagnosis not present

## 2017-07-18 DIAGNOSIS — I2510 Atherosclerotic heart disease of native coronary artery without angina pectoris: Secondary | ICD-10-CM | POA: Diagnosis not present

## 2017-07-18 DIAGNOSIS — Z8673 Personal history of transient ischemic attack (TIA), and cerebral infarction without residual deficits: Secondary | ICD-10-CM

## 2017-07-18 DIAGNOSIS — I5032 Chronic diastolic (congestive) heart failure: Secondary | ICD-10-CM | POA: Diagnosis present

## 2017-07-18 DIAGNOSIS — C786 Secondary malignant neoplasm of retroperitoneum and peritoneum: Secondary | ICD-10-CM | POA: Diagnosis present

## 2017-07-18 DIAGNOSIS — Z902 Acquired absence of lung [part of]: Secondary | ICD-10-CM

## 2017-07-18 DIAGNOSIS — C7972 Secondary malignant neoplasm of left adrenal gland: Secondary | ICD-10-CM | POA: Diagnosis not present

## 2017-07-18 DIAGNOSIS — M6289 Other specified disorders of muscle: Secondary | ICD-10-CM | POA: Diagnosis not present

## 2017-07-18 DIAGNOSIS — Z923 Personal history of irradiation: Secondary | ICD-10-CM | POA: Diagnosis not present

## 2017-07-18 DIAGNOSIS — Z91048 Other nonmedicinal substance allergy status: Secondary | ICD-10-CM

## 2017-07-18 DIAGNOSIS — I110 Hypertensive heart disease with heart failure: Secondary | ICD-10-CM | POA: Diagnosis present

## 2017-07-18 DIAGNOSIS — Z881 Allergy status to other antibiotic agents status: Secondary | ICD-10-CM

## 2017-07-18 DIAGNOSIS — C799 Secondary malignant neoplasm of unspecified site: Secondary | ICD-10-CM

## 2017-07-18 DIAGNOSIS — C7889 Secondary malignant neoplasm of other digestive organs: Secondary | ICD-10-CM | POA: Diagnosis present

## 2017-07-18 DIAGNOSIS — C7800 Secondary malignant neoplasm of unspecified lung: Secondary | ICD-10-CM | POA: Diagnosis not present

## 2017-07-18 DIAGNOSIS — Z87891 Personal history of nicotine dependence: Secondary | ICD-10-CM | POA: Diagnosis not present

## 2017-07-18 DIAGNOSIS — Z66 Do not resuscitate: Secondary | ICD-10-CM | POA: Diagnosis present

## 2017-07-18 DIAGNOSIS — C7989 Secondary malignant neoplasm of other specified sites: Secondary | ICD-10-CM | POA: Diagnosis present

## 2017-07-18 DIAGNOSIS — I10 Essential (primary) hypertension: Secondary | ICD-10-CM | POA: Diagnosis not present

## 2017-07-18 DIAGNOSIS — C7951 Secondary malignant neoplasm of bone: Secondary | ICD-10-CM | POA: Diagnosis not present

## 2017-07-18 DIAGNOSIS — R4781 Slurred speech: Secondary | ICD-10-CM | POA: Diagnosis not present

## 2017-07-18 DIAGNOSIS — J449 Chronic obstructive pulmonary disease, unspecified: Secondary | ICD-10-CM | POA: Diagnosis not present

## 2017-07-18 DIAGNOSIS — R4701 Aphasia: Secondary | ICD-10-CM

## 2017-07-18 DIAGNOSIS — G936 Cerebral edema: Secondary | ICD-10-CM | POA: Diagnosis not present

## 2017-07-18 DIAGNOSIS — Z7951 Long term (current) use of inhaled steroids: Secondary | ICD-10-CM

## 2017-07-18 DIAGNOSIS — C3491 Malignant neoplasm of unspecified part of right bronchus or lung: Secondary | ICD-10-CM

## 2017-07-18 DIAGNOSIS — M899 Disorder of bone, unspecified: Secondary | ICD-10-CM | POA: Diagnosis present

## 2017-07-18 DIAGNOSIS — Z888 Allergy status to other drugs, medicaments and biological substances status: Secondary | ICD-10-CM

## 2017-07-18 DIAGNOSIS — C7971 Secondary malignant neoplasm of right adrenal gland: Secondary | ICD-10-CM | POA: Diagnosis present

## 2017-07-18 DIAGNOSIS — Z8379 Family history of other diseases of the digestive system: Secondary | ICD-10-CM

## 2017-07-18 DIAGNOSIS — I6789 Other cerebrovascular disease: Secondary | ICD-10-CM | POA: Diagnosis not present

## 2017-07-18 DIAGNOSIS — C7931 Secondary malignant neoplasm of brain: Secondary | ICD-10-CM | POA: Diagnosis not present

## 2017-07-18 DIAGNOSIS — J4520 Mild intermittent asthma, uncomplicated: Secondary | ICD-10-CM | POA: Diagnosis not present

## 2017-07-18 DIAGNOSIS — Z800 Family history of malignant neoplasm of digestive organs: Secondary | ICD-10-CM

## 2017-07-18 DIAGNOSIS — Z818 Family history of other mental and behavioral disorders: Secondary | ICD-10-CM

## 2017-07-18 DIAGNOSIS — C3412 Malignant neoplasm of upper lobe, left bronchus or lung: Secondary | ICD-10-CM | POA: Diagnosis present

## 2017-07-18 DIAGNOSIS — Z882 Allergy status to sulfonamides status: Secondary | ICD-10-CM

## 2017-07-18 DIAGNOSIS — I252 Old myocardial infarction: Secondary | ICD-10-CM

## 2017-07-18 DIAGNOSIS — Z823 Family history of stroke: Secondary | ICD-10-CM

## 2017-07-18 DIAGNOSIS — R569 Unspecified convulsions: Secondary | ICD-10-CM | POA: Diagnosis not present

## 2017-07-18 DIAGNOSIS — Z8249 Family history of ischemic heart disease and other diseases of the circulatory system: Secondary | ICD-10-CM

## 2017-07-18 DIAGNOSIS — J439 Emphysema, unspecified: Secondary | ICD-10-CM | POA: Diagnosis not present

## 2017-07-18 DIAGNOSIS — Z833 Family history of diabetes mellitus: Secondary | ICD-10-CM

## 2017-07-18 DIAGNOSIS — Z79899 Other long term (current) drug therapy: Secondary | ICD-10-CM

## 2017-07-18 DIAGNOSIS — Z7982 Long term (current) use of aspirin: Secondary | ICD-10-CM

## 2017-07-18 DIAGNOSIS — C719 Malignant neoplasm of brain, unspecified: Secondary | ICD-10-CM | POA: Diagnosis not present

## 2017-07-18 DIAGNOSIS — C771 Secondary and unspecified malignant neoplasm of intrathoracic lymph nodes: Secondary | ICD-10-CM | POA: Diagnosis present

## 2017-07-18 DIAGNOSIS — J45909 Unspecified asthma, uncomplicated: Secondary | ICD-10-CM | POA: Diagnosis present

## 2017-07-18 DIAGNOSIS — Z825 Family history of asthma and other chronic lower respiratory diseases: Secondary | ICD-10-CM

## 2017-07-18 DIAGNOSIS — E785 Hyperlipidemia, unspecified: Secondary | ICD-10-CM | POA: Diagnosis not present

## 2017-07-18 LAB — DIFFERENTIAL
Basophils Absolute: 0.1 10*3/uL (ref 0.0–0.1)
Basophils Relative: 1 %
EOS ABS: 0.3 10*3/uL (ref 0.0–0.7)
EOS PCT: 3 %
LYMPHS ABS: 2.4 10*3/uL (ref 0.7–4.0)
Lymphocytes Relative: 23 %
MONO ABS: 0.8 10*3/uL (ref 0.1–1.0)
MONOS PCT: 8 %
NEUTROS PCT: 65 %
Neutro Abs: 7 10*3/uL (ref 1.7–7.7)

## 2017-07-18 LAB — I-STAT CHEM 8, ED
BUN: 16 mg/dL (ref 6–20)
CREATININE: 0.8 mg/dL (ref 0.44–1.00)
Calcium, Ion: 1.01 mmol/L — ABNORMAL LOW (ref 1.15–1.40)
Chloride: 99 mmol/L — ABNORMAL LOW (ref 101–111)
GLUCOSE: 107 mg/dL — AB (ref 65–99)
HCT: 39 % (ref 36.0–46.0)
HEMOGLOBIN: 13.3 g/dL (ref 12.0–15.0)
Potassium: 3.8 mmol/L (ref 3.5–5.1)
Sodium: 142 mmol/L (ref 135–145)
TCO2: 36 mmol/L — AB (ref 22–32)

## 2017-07-18 LAB — PROTIME-INR
INR: 0.95
Prothrombin Time: 12.6 seconds (ref 11.4–15.2)

## 2017-07-18 LAB — COMPREHENSIVE METABOLIC PANEL
ALBUMIN: 3.8 g/dL (ref 3.5–5.0)
ALK PHOS: 94 U/L (ref 38–126)
ALT: 25 U/L (ref 14–54)
ANION GAP: 13 (ref 5–15)
AST: 24 U/L (ref 15–41)
BILIRUBIN TOTAL: 0.5 mg/dL (ref 0.3–1.2)
BUN: 13 mg/dL (ref 6–20)
CALCIUM: 9.2 mg/dL (ref 8.9–10.3)
CO2: 34 mmol/L — ABNORMAL HIGH (ref 22–32)
Chloride: 99 mmol/L — ABNORMAL LOW (ref 101–111)
Creatinine, Ser: 0.9 mg/dL (ref 0.44–1.00)
GFR calc Af Amer: >60 mL/min (ref >60)
GFR, EST NON AFRICAN AMERICAN: 59 mL/min — AB (ref >60)
Glucose, Bld: 112 mg/dL — ABNORMAL HIGH (ref 65–99)
POTASSIUM: 4 mmol/L (ref 3.5–5.1)
Sodium: 146 mmol/L — ABNORMAL HIGH (ref 135–145)
TOTAL PROTEIN: 6.4 g/dL — AB (ref 6.5–8.1)

## 2017-07-18 LAB — CBC
HCT: 43.2 % (ref 36.0–46.0)
HEMOGLOBIN: 13 g/dL (ref 12.0–15.0)
MCH: 28.4 pg (ref 26.0–34.0)
MCHC: 30.1 g/dL (ref 30.0–36.0)
MCV: 94.5 fL (ref 78.0–100.0)
Platelets: 285 10*3/uL (ref 150–400)
RBC: 4.57 MIL/uL (ref 3.87–5.11)
RDW: 14.3 % (ref 11.5–15.5)
WBC: 10.5 10*3/uL (ref 4.0–10.5)

## 2017-07-18 LAB — CBG MONITORING, ED: GLUCOSE-CAPILLARY: 110 mg/dL — AB (ref 65–99)

## 2017-07-18 LAB — I-STAT TROPONIN, ED: Troponin i, poc: 0 ng/mL (ref 0.00–0.08)

## 2017-07-18 LAB — APTT: aPTT: 28 seconds (ref 24–36)

## 2017-07-18 MED ORDER — OMEGA-3-ACID ETHYL ESTERS 1 G PO CAPS
1.0000 g | ORAL_CAPSULE | Freq: Every day | ORAL | Status: DC
  Administered 2017-07-19 – 2017-07-23 (×4): 1 g via ORAL
  Filled 2017-07-18 (×5): qty 1

## 2017-07-18 MED ORDER — ASPIRIN 81 MG PO CHEW
81.0000 mg | CHEWABLE_TABLET | Freq: Every day | ORAL | Status: DC
  Administered 2017-07-19 – 2017-07-23 (×5): 81 mg via ORAL
  Filled 2017-07-18 (×5): qty 1

## 2017-07-18 MED ORDER — FUROSEMIDE 40 MG PO TABS
80.0000 mg | ORAL_TABLET | Freq: Every day | ORAL | Status: DC
  Administered 2017-07-19 – 2017-07-23 (×5): 80 mg via ORAL
  Filled 2017-07-18 (×5): qty 2

## 2017-07-18 MED ORDER — ACETAMINOPHEN 650 MG RE SUPP: 650.0000 mg | Freq: Four times a day (QID) | RECTAL | Status: DC | PRN

## 2017-07-18 MED ORDER — DEXAMETHASONE SODIUM PHOSPHATE 10 MG/ML IJ SOLN
10.0000 mg | Freq: Once | INTRAMUSCULAR | Status: AC
  Administered 2017-07-18: 10 mg via INTRAVENOUS
  Filled 2017-07-18: qty 1

## 2017-07-18 MED ORDER — POTASSIUM CHLORIDE CRYS ER 20 MEQ PO TBCR
20.0000 meq | EXTENDED_RELEASE_TABLET | Freq: Every day | ORAL | Status: DC
  Administered 2017-07-19 – 2017-07-21 (×3): 20 meq via ORAL
  Filled 2017-07-18 (×3): qty 1

## 2017-07-18 MED ORDER — METOPROLOL TARTRATE 25 MG PO TABS
25.0000 mg | ORAL_TABLET | Freq: Two times a day (BID) | ORAL | Status: DC
  Administered 2017-07-19 – 2017-07-23 (×10): 25 mg via ORAL
  Filled 2017-07-18 (×10): qty 1

## 2017-07-18 MED ORDER — NITROGLYCERIN 0.4 MG SL SUBL: 0.4000 mg | SUBLINGUAL_TABLET | SUBLINGUAL | Status: DC | PRN

## 2017-07-18 MED ORDER — MOMETASONE FURO-FORMOTEROL FUM 200-5 MCG/ACT IN AERO
2.0000 | INHALATION_SPRAY | Freq: Two times a day (BID) | RESPIRATORY_TRACT | Status: DC
  Administered 2017-07-19 – 2017-07-23 (×8): 2 via RESPIRATORY_TRACT
  Filled 2017-07-18: qty 8.8

## 2017-07-18 MED ORDER — ALBUTEROL SULFATE (2.5 MG/3ML) 0.083% IN NEBU: 2.5000 mg | INHALATION_SOLUTION | RESPIRATORY_TRACT | Status: DC | PRN

## 2017-07-18 MED ORDER — ONDANSETRON HCL 4 MG/2ML IJ SOLN
4.0000 mg | Freq: Four times a day (QID) | INTRAMUSCULAR | Status: DC | PRN
Start: 2017-07-18 — End: 2017-07-23

## 2017-07-18 MED ORDER — NITROFURANTOIN MONOHYD MACRO 100 MG PO CAPS: 100.0000 mg | ORAL_CAPSULE | Freq: Two times a day (BID) | ORAL | 0 refills | Status: DC

## 2017-07-18 MED ORDER — DEXAMETHASONE SODIUM PHOSPHATE 10 MG/ML IJ SOLN
6.0000 mg | Freq: Four times a day (QID) | INTRAMUSCULAR | Status: DC
  Administered 2017-07-19 – 2017-07-23 (×15): 6 mg via INTRAVENOUS
  Filled 2017-07-18 (×17): qty 0.6

## 2017-07-18 MED ORDER — DONEPEZIL HCL 5 MG PO TABS
5.0000 mg | ORAL_TABLET | Freq: Every day | ORAL | Status: DC
  Administered 2017-07-18 – 2017-07-22 (×5): 5 mg via ORAL
  Filled 2017-07-18 (×5): qty 1

## 2017-07-18 MED ORDER — ONDANSETRON HCL 4 MG PO TABS: 4.0000 mg | ORAL_TABLET | Freq: Four times a day (QID) | ORAL | Status: DC | PRN

## 2017-07-18 MED ORDER — ACETAMINOPHEN 325 MG PO TABS
650.0000 mg | ORAL_TABLET | Freq: Four times a day (QID) | ORAL | Status: DC | PRN
  Administered 2017-07-20 – 2017-07-23 (×2): 650 mg via ORAL
  Filled 2017-07-18 (×2): qty 2

## 2017-07-18 MED ORDER — LOSARTAN POTASSIUM 50 MG PO TABS
100.0000 mg | ORAL_TABLET | Freq: Every day | ORAL | Status: DC
  Administered 2017-07-19 – 2017-07-23 (×5): 100 mg via ORAL
  Filled 2017-07-18 (×5): qty 2

## 2017-07-18 NOTE — ED Notes (Signed)
Returns from CT 

## 2017-07-18 NOTE — ED Notes (Signed)
Pt states that she was unable to complete the scan. Pt denies being claustrophobic but states that she just couldn't do it.

## 2017-07-18 NOTE — ED Triage Notes (Addendum)
Pt from home with ems for sudden onset garbled speech and abnormal gait with a cold sweat that lasted about 30 mins.LSN 1600. Pt was with daughter during episode, daughter drove pt to the fire dept and by the time EMS arrived her symptoms had resolved. Pt a/o, VSS, no neuro deficits noted at this time

## 2017-07-18 NOTE — ED Notes (Signed)
Patient transported to MRI 

## 2017-07-18 NOTE — ED Notes (Signed)
Pt transported to [facility] by Biomedical scientist with all belongings.

## 2017-07-18 NOTE — ED Notes (Signed)
Pt's Daughter Hilda Blades would like to be contacted with any updates/bed assignments. [phone]

## 2017-07-18 NOTE — ED Notes (Signed)
Admitting MD at bedside.

## 2017-07-18 NOTE — ED Provider Notes (Signed)
[facility] EMERGENCY DEPARTMENT Provider Note   CSN: [id number] Arrival date & time: 07/18/17  1708     History   Chief Complaint Chief Complaint  Patient presents with  . Transient Ischemic Attack    HPI Brandy Cameron is a 82 y.o. female.  HPI Patient presents after an episode of difficulty speaking.  Has been under stress recently because she has had to move out of her sister's house.  Today she had an episode with sudden onset slurred speech.  Reportedl speech was right in his her head but would not come out.y reportedly looked sweaty during the episode 2.  However she was moving and states that other people were sweaty at the time.  Has a history of lung cancer reportedly recently found recurrence.  States she is supposed to see oncology about follow-up from her PET scan.  Symptoms has resolved.  Last around 1/2-hour.  No lateralizing numbness or weakness. Past Medical History:  Diagnosis Date  . Asthma   . CAD (coronary artery disease)    a. LHC 9/11 (in setting of NSTEMI): LAD irregs, mLCx 50, pRCA 50, inf-apical AK;  b. Myoview 9/13: Normal stress nuclear study.  LV Ejection Fraction: 71%   . Carotid stenosis    a. Carotid US 9/13: bilat 0-39%;  b. Carotid US 7/15: no sig stenosis  . Chronic diastolic CHF (congestive heart failure) ([place])    a. Echo 7/15: Mild LVH, mild focal basal septal hypertrophy, EF 50-55%, no RWMA, Gr 1 DD  . COPD (chronic obstructive pulmonary disease) ([place])   . HTN (hypertension)   . Lung cancer [facility])    [name]  Marland Kitchen NSTEMI (non-ST elevated myocardial infarction) ([place])    with only mild to moderate CAD in September of 2011,There was concern for apival ballooning. >> ? Tako-Tsubo syndrome vs vasospasm    Patient Active Problem List   Diagnosis Date Noted  . Lower abdominal pain 06/27/2017  . Intractable vomiting with nausea 06/13/2017  .  Chronic respiratory failure ([place]) 10/14/2015  . Hyperglycemia 10/07/2015  . Menopause ovarian failure 10/07/2015  . Encounter for colorectal cancer screening 10/07/2015  . COPD (chronic obstructive pulmonary disease) ([place]) 08/29/2015  . GERD (gastroesophageal reflux disease) 08/29/2015  . Cerebrovascular disease 08/08/2013  . ASTHMA 12/26/2010  . CAD (coronary artery disease) 11/11/2010  . Hypertension 11/11/2010  . Hyperlipidemia with target LDL less than 70 11/11/2010  . Primary cancer of left upper lobe of lung ([place]) 11/11/2010    Past Surgical History:  Procedure Laterality Date  . CATARACT SURGERY    . FOOT SURGERY    . GALLBLADDER SURGERY    . KNEE SURGERY    . LUNG LOBECTOMY    . VESICOVAGINAL FISTULA CLOSURE W/ TAH       OB History   None      Home Medications    Prior to Admission medications   Medication Sig Start Date End Date Taking? Authorizing Provider  albuterol (PROVENTIL) (2.5 MG/3ML) 0.083% nebulizer solution Take 3 mLs (2.5 mg total) by nebulization every 4 (four) hours as needed for wheezing or shortness of breath. 07/08/16   [name], MD  aspirin 81 MG tablet Take 81 mg by mouth daily.      [provider]  budesonide-formoterol (SYMBICORT) 160-4.5 MCG/ACT inhaler Inhale 2 puffs into the lungs 2 (two) times daily. 10/29/16   Tera Partridge  E, MD  calcium carbonate 1250 MG capsule Take 1,250 mg by mouth daily.     [provider]  Cholecalciferol (VITAMIN D3 PO) Take by mouth daily.    [provider]  DOCOSAHEXAENOIC ACID PO Take 1 g by mouth.    [provider]  donepezil (ARICEPT) 5 MG tablet Take 1/2 tablet daily for 2 weeks, then increase to 1 tablet daily 07/07/17   Cameron Sprang, MD  donepezil (ARICEPT) 5 MG tablet Take 5 mg by mouth at bedtime.    [provider]  furosemide (LASIX) 40 MG tablet Take 2 tablets (80 mg total) by mouth daily. 07/13/17   Lelon Perla, MD  losartan (COZAAR)  100 MG tablet Take 1 tablet (100 mg total) by mouth daily. 07/12/17   [name], MD  metoprolol tartrate (LOPRESSOR) 25 MG tablet Take 1 tablet (25 mg total) by mouth 2 (two) times daily. 07/12/17   [name], MD  nitrofurantoin, macrocrystal-monohydrate, (MACROBID) 100 MG capsule Take 1 capsule (100 mg total) by mouth 2 (two) times daily for 7 days. 07/18/17 2017-08-24  Marrian Salvage, FNP  nitroGLYCERIN (NITROSTAT) 0.4 MG SL tablet Place under the tongue.    [provider]  Omega-3 Fatty Acids (FISH OIL PO) Take by mouth daily.    [provider]  OXYGEN Inhale 2 L into the lungs. 2 liters continuous    [provider]  potassium chloride SA (K-DUR,KLOR-CON) 20 MEQ tablet Take 1 tablet (20 mEq total) by mouth daily. 07/12/17   [name], MD  Spacer/Aero-Holding Chambers (AEROCHAMBER MV) inhaler Use as instructed 08/29/15   [name], MD    Family History Family History  Problem Relation Age of Onset  . Heart disease Mother 80  . Asthma Mother   . Diabetes Mother   . Cancer Mother        Salivary  . Emphysema Mother   . Heart disease Unknown        POSITIVE FAMILY HX. OF   . Pancreatitis Unknown        QUESTIONABLE FROM ACE INHIBITORS  . COPD Sister   . Dementia Brother   . Stroke Brother     Social History Social History   Tobacco Use  . Smoking status: Former Smoker    Packs/day: 1.50    Years: 42.00    Pack years: 63.00    Types: Cigarettes    Last attempt to quit: 04/26/2006    Years since quitting: 11.2  . Smokeless tobacco: Never Used  Substance Use Topics  . Alcohol use: No    Alcohol/week: 0.0 oz  . Drug use: No     Allergies   Augmentin [amoxicillin-pot clavulanate]; Sulfa antibiotics; Tape; Amlodipine; Pantoprazole; and Sulfonamide derivatives   Review of Systems Review of Systems  Constitutional: Positive for diaphoresis. Negative for appetite change.  HENT: Negative for congestion.   Respiratory:  Negative for shortness of breath.   Cardiovascular: Negative for chest pain.  Gastrointestinal: Negative for abdominal pain.  Genitourinary: Negative for flank pain.  Musculoskeletal: Negative for back pain.  Neurological: Positive for speech difficulty. Negative for tremors.  Hematological: Negative for adenopathy.  Psychiatric/Behavioral: Negative for confusion.     Physical Exam Updated Vital Signs BP (!) 162/79   Pulse 91   Temp 97.9 F (36.6 C) (Oral)   Resp (!) 23   Ht 5\' 1"  (1.549 m)   Wt 95.7 kg (211 lb)   SpO2 99%  BMI 39.87 kg/m   Physical Exam  Constitutional: She is oriented to person, place, and time. She appears well-developed.  HENT:  Head: Normocephalic.  Eyes: EOM are normal.  Neck: Neck supple.  Cardiovascular: Normal rate.  Pulmonary/Chest: Effort normal.  Abdominal: There is no tenderness.  Musculoskeletal: She exhibits no edema.  Neurological: She is alert and oriented to person, place, and time.  Normal speech now.  Eye movements intact.  Face symmetric.  Good grip strength bilaterally.  Skin: Skin is warm. Capillary refill takes less than 2 seconds.  Psychiatric: She has a normal mood and affect.     ED Treatments / Results  Labs (all labs ordered are listed, but only abnormal results are displayed) Labs Reviewed  COMPREHENSIVE METABOLIC PANEL - Abnormal; Notable for the following components:      Result Value   Sodium 146 (*)    Chloride 99 (*)    CO2 34 (*)    Glucose, Bld 112 (*)    Total Protein 6.4 (*)    GFR calc non Af Amer 59 (*)    All other components within normal limits  I-STAT CHEM 8, ED - Abnormal; Notable for the following components:   Chloride 99 (*)    Glucose, Bld 107 (*)    Calcium, Ion 1.01 (*)    TCO2 36 (*)    All other components within normal limits  PROTIME-INR  APTT  CBC  DIFFERENTIAL  I-STAT TROPONIN, ED  CBG MONITORING, ED    EKG EKG Interpretation  Date/Time:  Monday July 18 2017 17:34:22  EDT Ventricular Rate:  85 PR Interval:    QRS Duration: 86 QT Interval:  367 QTC Calculation: 437 R Axis:   -15 Text Interpretation:  Sinus rhythm Borderline left axis deviation Confirmed by Davonna Belling [phone]) on 07/18/2017 7:33:47 PM   Radiology Ct Head Wo Contrast  Result Date: 07/18/2017 CLINICAL DATA:  Garbled speech and abnormal gait EXAM: CT HEAD WITHOUT CONTRAST TECHNIQUE: Contiguous axial images were obtained from the base of the skull through the vertex without intravenous contrast. COMPARISON:  CT brain 06/13/2017 FINDINGS: Brain: No hemorrhage is visualized. Increased hypodensity within the right greater than left parietal white matter consistent with edema. 9 mm suspected hypodense mass with central focus of hemorrhage or calcification suspicious for metastatic disease. Edema in the right occipital lobe. Moderate atrophy. Stable ventricle size. Increased hypodensity in the right thalamus compared to prior. Possible hypodense area of edema within the left cerebellar peduncle. Fourth ventricle is patent. Basilar cisterns patent. Vascular: No hyperdense vessels.  Carotid vascular calcification Skull: No fracture. Sinuses/Orbits: No acute finding. Other: None IMPRESSION: 1. Moderate hypodensity within the right greater than left parietal white matter with suspected 9 mm hypodense mass in the right white matter with punctate central focus of hemorrhage or calcification, concerning for metastatic disease. Edema also present in the right occipital lobe and is suspected within the left cerebellum and right thalamus. Recommend MRI of the brain with and without contrast. 2. Atrophy and mild small vessel ischemic changes of the white matter. Electronically Signed   By: Donavan Foil M.D.   On: 07/18/2017 18:35    Procedures Procedures (including critical care time)  Medications Ordered in ED Medications - No data to display   Initial Impression / Assessment and Plan / ED Course  I have  reviewed the triage vital signs and the nursing notes.  Pertinent labs & imaging results that were available during my care of the patient were  reviewed by me and considered in my medical decision making (see chart for details).     Patient presents with difficulty speaking.  Resolved.  Workup shows likely brain metastasis.  Recently had PET scan that also shows widely metastatic lung cancer.  With the addition of the episodic neuro deficits and the brain metastasis I feel as if the patient benefit from admission for further evaluation and treatment.  No further neuro deficits while in the ER.  Final Clinical Impressions(s) / ED Diagnoses   Final diagnoses:  Metastatic cancer [facility])  Brain metastasis [facility])  Aphasia    ED Discharge Orders    None       Davonna Belling, MD 07/18/17 2031

## 2017-07-18 NOTE — ED Notes (Signed)
ED Provider at bedside. 

## 2017-07-18 NOTE — H&P (Signed)
History and Physical    Brandy Cameron HEN:[id number] DOB: 13-Aug-1935 DOA: 07/18/2017  PCP: [name], MD  Patient coming from: Home.  Chief Complaint: Difficulty speaking.  HPI: Brandy Cameron is a 82 y.o. female with history of recurrent non-small cell lung cancer being followed by oncologist who had recent PET scan done for progression of lung cancer started having some diaphoresis while she was with her daughter this afternoon while packing.  Following which patient's daughter noted that patient was having some difficulty speaking.  Patient was taken to the nearby fire station and over that patient symptoms resolved.  Patient was brought to the ER.  Patient states over the last 2 weeks patient has been having progressive weakness on walking.  And over the last 2 months has been having nausea vomiting.  During the difficulty speaking episode patient also had frontal headache.  ED Course: In the ER patient still had mild headache but appeared nonfocal.  CT head shows features concerning for metastatic lesions to the brain with edema.  Patient was started on Decadron 10 mg IV and MRI brain was ordered by the ER physician.  Review of Systems: As per HPI, rest all negative.   Past Medical History:  Diagnosis Date  . Asthma   . CAD (coronary artery disease)    a. LHC 9/11 (in setting of NSTEMI): LAD irregs, mLCx 50, pRCA 50, inf-apical AK;  b. Myoview 9/13: Normal stress nuclear study.  LV Ejection Fraction: 71%   . Carotid stenosis    a. Carotid US 9/13: bilat 0-39%;  b. Carotid US 7/15: no sig stenosis  . Chronic diastolic CHF (congestive heart failure) ([place])    a. Echo 7/15: Mild LVH, mild focal basal septal hypertrophy, EF 50-55%, no RWMA, Gr 1 DD  . COPD (chronic obstructive pulmonary disease) ([place])   . HTN (hypertension)   . Lung cancer [facility])    [name]  Marland Kitchen NSTEMI (non-ST elevated  myocardial infarction) ([place])    with only mild to moderate CAD in September of 2011,There was concern for apival ballooning. >> ? Tako-Tsubo syndrome vs vasospasm    Past Surgical History:  Procedure Laterality Date  . CATARACT SURGERY    . FOOT SURGERY    . GALLBLADDER SURGERY    . KNEE SURGERY    . LUNG LOBECTOMY    . VESICOVAGINAL FISTULA CLOSURE W/ TAH       reports that she quit smoking about 11 years ago. Her smoking use included cigarettes. She has a 63.00 pack-year smoking history. She has never used smokeless tobacco. She reports that she does not drink alcohol or use drugs.  Allergies  Allergen Reactions  . Augmentin [Amoxicillin-Pot Clavulanate] Diarrhea  . Sulfa Antibiotics Rash and Shortness Of Breath  . Tape Other (See Comments)  . Amlodipine   . Pantoprazole Nausea And Vomiting  . Sulfonamide Derivatives Rash    Family History  Problem Relation Age of Onset  . Heart disease Mother 73  . Asthma Mother   . Diabetes Mother   . Cancer Mother        Salivary  . Emphysema Mother   . Heart disease Unknown        POSITIVE FAMILY HX. OF   . Pancreatitis Unknown        QUESTIONABLE FROM ACE INHIBITORS  . COPD Sister   . Dementia Brother   .  Stroke Brother     Prior to Admission medications   Medication Sig Start Date End Date Taking? Authorizing Provider  albuterol (PROVENTIL) (2.5 MG/3ML) 0.083% nebulizer solution Take 3 mLs (2.5 mg total) by nebulization every 4 (four) hours as needed for wheezing or shortness of breath. 07/08/16   [name], MD  aspirin 81 MG tablet Take 81 mg by mouth daily.      [provider]  budesonide-formoterol (SYMBICORT) 160-4.5 MCG/ACT inhaler Inhale 2 puffs into the lungs 2 (two) times daily. 10/29/16   [name], MD  calcium carbonate 1250 MG capsule Take 1,250 mg by mouth daily.     [provider]  Cholecalciferol (VITAMIN D3 PO) Take by mouth daily.    [provider]  DOCOSAHEXAENOIC  ACID PO Take 1 g by mouth.    [provider]  donepezil (ARICEPT) 5 MG tablet Take 1/2 tablet daily for 2 weeks, then increase to 1 tablet daily 07/07/17   Cameron Sprang, MD  donepezil (ARICEPT) 5 MG tablet Take 5 mg by mouth at bedtime.    [provider]  furosemide (LASIX) 40 MG tablet Take 2 tablets (80 mg total) by mouth daily. 07/13/17   Lelon Perla, MD  losartan (COZAAR) 100 MG tablet Take 1 tablet (100 mg total) by mouth daily. 07/12/17   [name], MD  metoprolol tartrate (LOPRESSOR) 25 MG tablet Take 1 tablet (25 mg total) by mouth 2 (two) times daily. 07/12/17   [name], MD  nitrofurantoin, macrocrystal-monohydrate, (MACROBID) 100 MG capsule Take 1 capsule (100 mg total) by mouth 2 (two) times daily for 7 days. 07/18/17 08-10-2017  Marrian Salvage, FNP  nitroGLYCERIN (NITROSTAT) 0.4 MG SL tablet Place under the tongue.    [provider]  Omega-3 Fatty Acids (FISH OIL PO) Take by mouth daily.    [provider]  OXYGEN Inhale 2 L into the lungs. 2 liters continuous    [provider]  potassium chloride SA (K-DUR,KLOR-CON) 20 MEQ tablet Take 1 tablet (20 mEq total) by mouth daily. 07/12/17   [name], MD  Spacer/Aero-Holding Josiah Lobo (AEROCHAMBER MV) inhaler Use as instructed 08/29/15   [name], MD    Physical Exam: Vitals:   07/18/17 1723 07/18/17 1726 07/18/17 1745 07/18/17 1915  BP:  (!) 154/87 (!) 153/76 (!) 162/79  Pulse:  67 84 91  Resp:  20 (!) 25 (!) 23  Temp:  97.9 F (36.6 C)    TempSrc:  Oral    SpO2:  98% 100% 99%  Weight: 95.7 kg (211 lb)     Height: 5\' 1"  (1.549 m)         Constitutional: Moderately built and nourished. Vitals:   07/18/17 1723 07/18/17 1726 07/18/17 1745 07/18/17 1915  BP:  (!) 154/87 (!) 153/76 (!) 162/79  Pulse:  67 84 91  Resp:  20 (!) 25 (!) 23  Temp:  97.9 F (36.6 C)    TempSrc:  Oral    SpO2:  98% 100% 99%  Weight: 95.7 kg (211 lb)     Height: 5'  1" (1.549 m)      Eyes: Anicteric no pallor. ENMT: No discharge from the ears eyes nose or mouth. Neck: No neck rigidity no mass felt. Respiratory: No rhonchi or crepitations. Cardiovascular: S1-S2 heard no murmurs appreciated. Abdomen: Soft nontender bowel sounds present. Musculoskeletal: No edema.  No joint effusion. Skin: No rash.  Skin appears warm. Neurologic:  Alert awake oriented to time place and person.  Moves all extremities 5 x 5.  No facial asymmetry.  Tongue is midline. Psychiatric: Appears normal.  Normal affect.   Labs on Admission: I have personally reviewed following labs and imaging studies  CBC: Recent Labs  Lab 07/18/17 1812 07/18/17 1837  WBC  --  10.5  NEUTROABS  --  7.0  HGB 13.3 13.0  HCT 39.0 43.2  MCV  --  94.5  PLT  --  595   Basic Metabolic Panel: Recent Labs  Lab 07/18/17 1812 07/18/17 1837  NA 142 146*  K 3.8 4.0  CL 99* 99*  CO2  --  34*  GLUCOSE 107* 112*  BUN 16 13  CREATININE 0.80 0.90  CALCIUM  --  9.2   GFR: Estimated Creatinine Clearance: 52.7 mL/min (by C-G formula based on SCr of 0.9 mg/dL). Liver Function Tests: Recent Labs  Lab 07/18/17 1837  AST 24  ALT 25  ALKPHOS 94  BILITOT 0.5  PROT 6.4*  ALBUMIN 3.8   No results for input(s): LIPASE, AMYLASE in the last 168 hours. No results for input(s): AMMONIA in the last 168 hours. Coagulation Profile: Recent Labs  Lab 07/18/17 1837  INR 0.95   Cardiac Enzymes: No results for input(s): CKTOTAL, CKMB, CKMBINDEX, TROPONINI in the last 168 hours. BNP (last 3 results) No results for input(s): PROBNP in the last 8760 hours. HbA1C: No results for input(s): HGBA1C in the last 72 hours. CBG: Recent Labs  Lab 07/13/17 1042 07/18/17 2030  GLUCAP 130* 110*   Lipid Profile: No results for input(s): CHOL, HDL, LDLCALC, TRIG, CHOLHDL, LDLDIRECT in the last 72 hours. Thyroid Function Tests: No results for input(s): TSH, T4TOTAL, FREET4, T3FREE, THYROIDAB in the last 72  hours. Anemia Panel: No results for input(s): VITAMINB12, FOLATE, FERRITIN, TIBC, IRON, RETICCTPCT in the last 72 hours. Urine analysis:    Component Value Date/Time   COLORURINE YELLOW 04/27/2017 [place] 04/27/2017 1447   LABSPEC 1.010 04/27/2017 1447   PHURINE 7.0 04/27/2017 1447   GLUCOSEU NEGATIVE 04/27/2017 1447   HGBUR SMALL (A) 04/27/2017 1447   BILIRUBINUR neg 07/15/2017 1135   KETONESUR NEGATIVE 04/27/2017 1447   PROTEINUR 1 07/15/2017 1135   PROTEINUR 100 (A) 02/06/2016 1323   UROBILINOGEN 0.2 07/15/2017 1135   UROBILINOGEN 0.2 04/27/2017 1447   NITRITE neg 07/15/2017 1135   NITRITE NEGATIVE 04/27/2017 1447   LEUKOCYTESUR Negative 07/15/2017 1135   Sepsis Labs: @LABRCNTIP (procalcitonin:4,lacticidven:4) ) Recent Results (from the past 240 hour(s))  Urine Culture     Status: Abnormal   Collection Time: 07/15/17 11:59 AM  Result Value Ref Range Status   MICRO NUMBER: [id number]  Final   SPECIMEN QUALITY: ADEQUATE  Final   Sample Source NOT GIVEN  Final   STATUS: FINAL  Final   ISOLATE 1: Escherichia coli (A)  Final    Comment: 10,000-50,000 CFU/mL of Escherichia coli      Susceptibility   Escherichia coli - URINE CULTURE, REFLEX    AMOX/CLAVULANIC >=32 Resistant     AMPICILLIN >=32 Resistant     AMPICILLIN/SULBACTAM 16 Intermediate     CEFAZOLIN* 8 Resistant      * For uncomplicated UTI caused by E. coli,K. pneumoniae or P. mirabilis: Cefazolin issusceptible if MIC <32 mcg/mL and predictssusceptible to the oral agents cefaclor, cefdinir,cefpodoxime, cefprozil, cefuroxime, cephalexinand loracarbef.    CEFEPIME <=1 Sensitive     CEFTRIAXONE <=1 Sensitive     CIPROFLOXACIN <=0.25 Sensitive  LEVOFLOXACIN <=0.12 Sensitive     ERTAPENEM <=0.5 Sensitive     GENTAMICIN <=1 Sensitive     IMIPENEM <=0.25 Sensitive     NITROFURANTOIN <=16 Sensitive     PIP/TAZO <=4 Sensitive     TOBRAMYCIN <=1 Sensitive     TRIMETH/SULFA* <=20 Sensitive      * For  uncomplicated UTI caused by E. coli,K. pneumoniae or P. mirabilis: Cefazolin issusceptible if MIC <32 mcg/mL and predictssusceptible to the oral agents cefaclor, cefdinir,cefpodoxime, cefprozil, cefuroxime, cephalexinand loracarbef.Legend:S = Susceptible  I = IntermediateR = Resistant  NS = Not susceptible* = Not tested  NR = Not reported**NN = See antimicrobic comments     Radiological Exams on Admission: Ct Head Wo Contrast  Result Date: 07/18/2017 CLINICAL DATA:  Garbled speech and abnormal gait EXAM: CT HEAD WITHOUT CONTRAST TECHNIQUE: Contiguous axial images were obtained from the base of the skull through the vertex without intravenous contrast. COMPARISON:  CT brain 06/13/2017 FINDINGS: Brain: No hemorrhage is visualized. Increased hypodensity within the right greater than left parietal white matter consistent with edema. 9 mm suspected hypodense mass with central focus of hemorrhage or calcification suspicious for metastatic disease. Edema in the right occipital lobe. Moderate atrophy. Stable ventricle size. Increased hypodensity in the right thalamus compared to prior. Possible hypodense area of edema within the left cerebellar peduncle. Fourth ventricle is patent. Basilar cisterns patent. Vascular: No hyperdense vessels.  Carotid vascular calcification Skull: No fracture. Sinuses/Orbits: No acute finding. Other: None IMPRESSION: 1. Moderate hypodensity within the right greater than left parietal white matter with suspected 9 mm hypodense mass in the right white matter with punctate central focus of hemorrhage or calcification, concerning for metastatic disease. Edema also present in the right occipital lobe and is suspected within the left cerebellum and right thalamus. Recommend MRI of the brain with and without contrast. 2. Atrophy and mild small vessel ischemic changes of the white matter. Electronically Signed   By: Donavan Foil M.D.   On: 07/18/2017 18:35    EKG: Independently reviewed.   Normal sinus rhythm.  Assessment/Plan Principal Problem:   Brain metastasis (HCC) Active Problems:   CAD (coronary artery disease)   Hypertension   Primary cancer of left upper lobe of lung (HCC)   Asthma   COPD (chronic obstructive pulmonary disease) ([place])    1. Metastatic brain lesions likely from patient's known history of non-small cell lung cancer -discussed with on-call oncologist Dr. Lebron Conners who at this time advised to continue with the Decadron IV 6 mg q. 6 hourly and transfer patient to [facility] long [facility] and consult Dr. Julien Nordmann patient's oncologist in a.m. for further recommendations.  MRI brain is pending. 2. Chronic diastolic CHF on Lasix which will be continued. 3. Hypertension on Cozaar and metoprolol. 4. COPD not actively wheezing continue inhalers. 5. Hyperlipidemia continue home medications.   DVT prophylaxis: SCDs. Code Status: Full code. Family Communication: Discussed with patient. Disposition Plan: Home. Consults called: Discussed with oncologist. Admission status: Observation.   [name] MD [facility] Pager [phone].  If 7PM-7AM, please contact night-coverage [email or web address] Password [facility]  07/18/2017, 9:39 PM

## 2017-07-18 NOTE — ED Notes (Signed)
Care handoff to [facility] [place]

## 2017-07-19 ENCOUNTER — Other Ambulatory Visit: Payer: Self-pay

## 2017-07-19 ENCOUNTER — Encounter (HOSPITAL_COMMUNITY): Payer: Self-pay | Admitting: Oncology

## 2017-07-19 ENCOUNTER — Telehealth: Payer: Self-pay | Admitting: Medical Oncology

## 2017-07-19 ENCOUNTER — Ambulatory Visit: Payer: Medicare Other | Admitting: Oncology

## 2017-07-19 ENCOUNTER — Ambulatory Visit
Admit: 2017-07-19 | Discharge: 2017-07-19 | Disposition: A | Discharge status: Home / Self Care | Payer: Medicare Other | Attending: Radiation Oncology | Admitting: Radiation Oncology

## 2017-07-19 ENCOUNTER — Ambulatory Visit: Payer: Medicare Other | Admitting: Radiation Oncology

## 2017-07-19 DIAGNOSIS — E785 Hyperlipidemia, unspecified: Secondary | ICD-10-CM | POA: Diagnosis present

## 2017-07-19 DIAGNOSIS — C7972 Secondary malignant neoplasm of left adrenal gland: Secondary | ICD-10-CM | POA: Diagnosis present

## 2017-07-19 DIAGNOSIS — M6289 Other specified disorders of muscle: Secondary | ICD-10-CM | POA: Diagnosis present

## 2017-07-19 DIAGNOSIS — C786 Secondary malignant neoplasm of retroperitoneum and peritoneum: Secondary | ICD-10-CM | POA: Diagnosis present

## 2017-07-19 DIAGNOSIS — Z87891 Personal history of nicotine dependence: Secondary | ICD-10-CM | POA: Diagnosis not present

## 2017-07-19 DIAGNOSIS — R4701 Aphasia: Secondary | ICD-10-CM | POA: Diagnosis not present

## 2017-07-19 DIAGNOSIS — C771 Secondary and unspecified malignant neoplasm of intrathoracic lymph nodes: Secondary | ICD-10-CM | POA: Diagnosis not present

## 2017-07-19 DIAGNOSIS — I2510 Atherosclerotic heart disease of native coronary artery without angina pectoris: Secondary | ICD-10-CM | POA: Diagnosis not present

## 2017-07-19 DIAGNOSIS — Z923 Personal history of irradiation: Secondary | ICD-10-CM | POA: Diagnosis not present

## 2017-07-19 DIAGNOSIS — C7971 Secondary malignant neoplasm of right adrenal gland: Secondary | ICD-10-CM | POA: Diagnosis not present

## 2017-07-19 DIAGNOSIS — R222 Localized swelling, mass and lump, trunk: Secondary | ICD-10-CM | POA: Diagnosis not present

## 2017-07-19 DIAGNOSIS — C7951 Secondary malignant neoplasm of bone: Secondary | ICD-10-CM | POA: Diagnosis present

## 2017-07-19 DIAGNOSIS — M899 Disorder of bone, unspecified: Secondary | ICD-10-CM | POA: Diagnosis present

## 2017-07-19 DIAGNOSIS — Z8673 Personal history of transient ischemic attack (TIA), and cerebral infarction without residual deficits: Secondary | ICD-10-CM | POA: Diagnosis not present

## 2017-07-19 DIAGNOSIS — J439 Emphysema, unspecified: Secondary | ICD-10-CM | POA: Diagnosis not present

## 2017-07-19 DIAGNOSIS — C799 Secondary malignant neoplasm of unspecified site: Secondary | ICD-10-CM | POA: Diagnosis not present

## 2017-07-19 DIAGNOSIS — C3492 Malignant neoplasm of unspecified part of left bronchus or lung: Secondary | ICD-10-CM | POA: Diagnosis not present

## 2017-07-19 DIAGNOSIS — C3490 Malignant neoplasm of unspecified part of unspecified bronchus or lung: Secondary | ICD-10-CM | POA: Diagnosis not present

## 2017-07-19 DIAGNOSIS — J449 Chronic obstructive pulmonary disease, unspecified: Secondary | ICD-10-CM | POA: Diagnosis present

## 2017-07-19 DIAGNOSIS — C7931 Secondary malignant neoplasm of brain: Secondary | ICD-10-CM

## 2017-07-19 DIAGNOSIS — C7889 Secondary malignant neoplasm of other digestive organs: Secondary | ICD-10-CM

## 2017-07-19 DIAGNOSIS — Z66 Do not resuscitate: Secondary | ICD-10-CM | POA: Diagnosis present

## 2017-07-19 DIAGNOSIS — C7800 Secondary malignant neoplasm of unspecified lung: Secondary | ICD-10-CM

## 2017-07-19 DIAGNOSIS — J4520 Mild intermittent asthma, uncomplicated: Secondary | ICD-10-CM | POA: Diagnosis not present

## 2017-07-19 DIAGNOSIS — R569 Unspecified convulsions: Secondary | ICD-10-CM | POA: Diagnosis not present

## 2017-07-19 DIAGNOSIS — C3491 Malignant neoplasm of unspecified part of right bronchus or lung: Secondary | ICD-10-CM | POA: Diagnosis not present

## 2017-07-19 DIAGNOSIS — I110 Hypertensive heart disease with heart failure: Secondary | ICD-10-CM | POA: Diagnosis present

## 2017-07-19 DIAGNOSIS — Z85118 Personal history of other malignant neoplasm of bronchus and lung: Secondary | ICD-10-CM | POA: Diagnosis not present

## 2017-07-19 DIAGNOSIS — I10 Essential (primary) hypertension: Secondary | ICD-10-CM | POA: Diagnosis not present

## 2017-07-19 DIAGNOSIS — C7989 Secondary malignant neoplasm of other specified sites: Secondary | ICD-10-CM | POA: Diagnosis not present

## 2017-07-19 DIAGNOSIS — C3412 Malignant neoplasm of upper lobe, left bronchus or lung: Secondary | ICD-10-CM | POA: Diagnosis not present

## 2017-07-19 DIAGNOSIS — I252 Old myocardial infarction: Secondary | ICD-10-CM | POA: Diagnosis not present

## 2017-07-19 DIAGNOSIS — R918 Other nonspecific abnormal finding of lung field: Secondary | ICD-10-CM | POA: Diagnosis present

## 2017-07-19 DIAGNOSIS — I5032 Chronic diastolic (congestive) heart failure: Secondary | ICD-10-CM | POA: Diagnosis present

## 2017-07-19 MED ORDER — ENOXAPARIN SODIUM 40 MG/0.4ML ~~LOC~~ SOLN
40.0000 mg | SUBCUTANEOUS | Status: DC
  Administered 2017-07-19: 40 mg via SUBCUTANEOUS
  Filled 2017-07-19: qty 0.4

## 2017-07-19 NOTE — Telephone Encounter (Signed)
Pt admitted -"cancer in her stomach and brain".  Asking if Julien Nordmann would be in to see her . I told her he may try to get by there.

## 2017-07-19 NOTE — Progress Notes (Signed)
Subjective: The patient is seen and examined today.  She is a very pleasant 82 years old white female with likely recurrent non-small cell lung cancer, adenocarcinoma with positive PDL 1 expression of 60%.  That was initially diagnosed as a stage Ia in October 2010.  She was found recently on repeat CT scan of the chest to have concerning findings for disease recurrence.  I ordered a PET scan and the patient was supposed to come to the clinic today for evaluation and discussion of her scan results.  She was admitted to [facility] yesterday complaining of confusion and difficulty speaking as well as progressive weakness on walking.  She also had nausea and vomiting.  During her evaluation CT scan of the head followed by MRI of the brain showed numerous, at least 20 metastasis throughout the brain most of which are centrally cystic or necrotic.  There was moderate edema surrounding the right parietal and left occipital largest lesions with no associated midline shift or hydrocephalus.  The patient was a started on Decadron 6 mg IV every 6 hours.  She is feeling much better today.  She denied having any current fever or chills.  She has no current nausea or vomiting.  Objective: Vital signs in last 24 hours: Temp:  [97.9 F (36.6 C)-98.2 F (36.8 C)] 98 F (36.7 C) (03/26 1413) Pulse Rate:  [67-98] 78 (03/26 1413) Resp:  [18-32] 18 (03/26 1413) BP: (124-171)/(57-91) 124/57 (03/26 1413) SpO2:  [95 %-100 %] 97 % (03/26 1413) Weight:  [209 lb 10.5 oz (95.1 kg)-211 lb (95.7 kg)] 209 lb 10.5 oz (95.1 kg) (03/26 0031)  Intake/Output from previous day: No intake/output data recorded. Intake/Output this shift: Total I/O In: 360 [P.O.:360] Out: -   General appearance: alert, cooperative, fatigued and no distress Resp: clear to auscultation bilaterally Cardio: regular rate and rhythm, S1, S2 normal, no murmur, click, rub or gallop GI: soft, non-tender; bowel sounds normal; no masses,  no  organomegaly Extremities: extremities normal, atraumatic, no cyanosis or edema  Lab Results:  Recent Labs    07/18/17 1812 07/18/17 1837  WBC  --  10.5  HGB 13.3 13.0  HCT 39.0 43.2  PLT  --  285   BMET Recent Labs    07/18/17 1812 07/18/17 1837  NA 142 146*  K 3.8 4.0  CL 99* 99*  CO2  --  34*  GLUCOSE 107* 112*  BUN 16 13  CREATININE 0.80 0.90  CALCIUM  --  9.2    Studies/Results: Ct Head Wo Contrast  Result Date: 07/18/2017 CLINICAL DATA:  Garbled speech and abnormal gait EXAM: CT HEAD WITHOUT CONTRAST TECHNIQUE: Contiguous axial images were obtained from the base of the skull through the vertex without intravenous contrast. COMPARISON:  CT brain 06/13/2017 FINDINGS: Brain: No hemorrhage is visualized. Increased hypodensity within the right greater than left parietal white matter consistent with edema. 9 mm suspected hypodense mass with central focus of hemorrhage or calcification suspicious for metastatic disease. Edema in the right occipital lobe. Moderate atrophy. Stable ventricle size. Increased hypodensity in the right thalamus compared to prior. Possible hypodense area of edema within the left cerebellar peduncle. Fourth ventricle is patent. Basilar cisterns patent. Vascular: No hyperdense vessels.  Carotid vascular calcification Skull: No fracture. Sinuses/Orbits: No acute finding. Other: None IMPRESSION: 1. Moderate hypodensity within the right greater than left parietal white matter with suspected 9 mm hypodense mass in the right white matter with punctate central focus of hemorrhage or calcification, concerning for  metastatic disease. Edema also present in the right occipital lobe and is suspected within the left cerebellum and right thalamus. Recommend MRI of the brain with and without contrast. 2. Atrophy and mild small vessel ischemic changes of the white matter. Electronically Signed   By: Donavan Foil M.D.   On: 07/18/2017 18:35   Mr Brain Wo Contrast  Result  Date: 07/18/2017 CLINICAL DATA:  Speech difficulty EXAM: MRI HEAD WITHOUT CONTRAST TECHNIQUE: Multiplanar, multiecho pulse sequences of the brain and surrounding structures were obtained without intravenous contrast. COMPARISON:  None. FINDINGS: The examination had to be discontinued prior to completion due to patient request to terminate the study early. Axial and coronal diffusion-weighted imaging, corresponding ADC maps, sagittal T1-weighted imaging and axial T2-weighted imaging were acquired. No contrast was administered. Brain: There are numerous lesions throughout the brain that show peripheral diffusion restriction. There is no central diffusion restriction to suggest abscess. These are most consistent with metastases. The largest lesion is in the right parietal white matter measures 2.0 x 1.9 cm. There are at least 20 lesions. Edema is worst surrounding right parietal and left occipital lesions. No midline shift. No hydrocephalus. Vascular: Major intracranial arterial and venous sinus flow voids are preserved. Skull and upper cervical spine: Normal marrow signal. Sinuses/Orbits: Paranasal sinuses are clear. No mastoid or middle ear effusion. Bilateral lens replacements. Other: None. IMPRESSION: 1. Truncated examination. No intravenous contrast material was administered. 2. Numerous (at least 20) metastases throughout the brain, most of which are centrally cystic or necrotic. 3. Moderate edema surrounding the right parietal and left occipital largest lesions. No associated midline shift or hydrocephalus. Electronically Signed   By: Ulyses Jarred M.D.   On: 07/18/2017 22:53    Medications: I have reviewed the patient's current medications.  Assessment/Plan: This is a very pleasant 82 years old white female presented with extensive and widely metastatic disease involving the lung, brain, mediastinal lymph nodes, adrenal glands, pancreas, perirenal space as well as left upper quadrant omentum in addition  to muscular, bone and subcutaneous tissues.  She was initially diagnosed in 2010 was a stage IA non-small cell lung cancer, adenocarcinoma. I had a lengthy discussion with the patient and her daughter today about her current disease status and treatment options. I strongly recommended for the patient to consider whole brain irradiation for the multiple metastatic brain lesions. I also recommend for the patient to consider proceeding was CT-guided/ultrasound-guided core biopsy of the right gluteus medius muscle for confirmation of her tissue diagnosis. Based on the final pathology, I will discuss with the patient her treatment options including palliative care versus consideration of palliative treatment with immunotherapy or a combination of chemotherapy and immunotherapy. The patient is interested in treatment.  I will arrange for her to have a Port-A-Cath placed during her hospitalization. For the metastatic brain radiation, she will continue her current treatment with Decadron for now. I will arrange for the patient a follow-up appointment with me in around 2 weeks close to the end of her palliative radiotherapy to the brain for more detailed discussion of her treatment options. The patient and her daughter agreed to the current plan. Thank you so much for taken care of Ms. Hibbet, I would continue to follow observation with you on assist in her management on as-needed basis.   LOS: 0 days    Eilleen Kempf 07/19/2017

## 2017-07-19 NOTE — Progress Notes (Signed)
Responding to page by RN  Assisted pt in notarizing health care power of attorney and living will.

## 2017-07-19 NOTE — Progress Notes (Signed)
[facility] PROGRESS NOTE  Johannah Rozas Carvell JWJ:[id number] DOB: 29-Oct-1935 DOA: 07/18/2017   PCP: [name], MD     Assessment/Plan: Principal Problem:   Brain metastasis [facility]) Active Problems:   CAD (coronary artery disease)   Hypertension   Primary cancer of left upper lobe of lung ([place])   Asthma   COPD (chronic obstructive pulmonary disease) ([place])   82 y.o. female with history of recurrent non-small cell lung cancer being followed by oncologist who had recent PET scan done for progression of lung cancer started having some diaphoresis , daughter noted that patient was having some difficulty speaking. She was recently diagnosed with recurrent non-small cell lung cancer, adenocarcinoma with positive PDL 1 expression (60%) with left upper lobe as well as right upper lobe pulmonary nodules. She was initially diagnosed with a stage IA (T1a, N0, M0) in in October 2010.MRI of the brain concerning for metastatic lesions  Assessment and plan  1. Metastatic brain lesions likely from patient's known history of non-small cell lung cancer -discussed with on-call oncologist Dr. Lebron Conners who at this time advised to continue with the Decadron IV 6 mg q. 6 hourly and transfer patient to [facility] and consult Dr. Julien Nordmann patient's oncologist was called and notified requesting  recommendations.  He has requested [place] to call radiation oncology, notify Dr. Tammi Klippel, 2. Chronic diastolic CHF on Lasix which will be continued. 3. Hypertension on Cozaar and metoprolol. 4. COPD not actively wheezing continue inhalers. 5. Hyperlipidemia continue home medications.   DVT prophylaxsis  Lovenox  Code Status:  Full code    Family Communication: Discussed in detail with the patient, all imaging results, lab results explained to the patient   Disposition Plan:  Requested radiation oncology and oncology to decide plan of care       Consultants:  Radiation  oncology  Oncology  Procedures:  None   Antibiotics: Anti-infectives (From admission, onward)   None         HPI/Subjective: atient appears comfortable denies any headache, nausea, vomiting, blurry vision  Objective: Vitals:   07/18/17 2353 07/19/17 0031 07/19/17 0506 07/19/17 1022  BP:  (!) 150/81 132/77   Pulse:  98 80   Resp:  (!) 24 20   Temp:  97.9 F (36.6 C) 98.2 F (36.8 C)   TempSrc:  Oral Oral   SpO2: 99% 99% 100% 95%  Weight:  95.1 kg (209 lb 10.5 oz)    Height:       No intake or output data in the 24 hours ending 07/19/17 1231  Exam:  Examination:  General exam: Appears calm and comfortable  Respiratory system: Clear to auscultation. Respiratory effort normal. Cardiovascular system: S1 & S2 heard, RRR. No JVD, murmurs, rubs, gallops or clicks. No pedal edema. Gastrointestinal system: Abdomen is nondistended, soft and nontender. No organomegaly or masses felt. Normal bowel sounds heard. Central nervous system: Alert and oriented. No focal neurological deficits. Extremities: Symmetric 5 x 5 power. Skin: No rashes, lesions or ulcers Psychiatry: Judgement and insight appear normal. Mood & affect appropriate.     Data Reviewed: I have personally reviewed following labs and imaging studies  Micro Results Recent Results (from the past 240 hour(s))  Urine Culture     Status: Abnormal   Collection Time: 07/15/17 11:59 AM  Result Value Ref Range Status   MICRO NUMBER: [id number]  Final   SPECIMEN QUALITY: ADEQUATE  Final   Sample Source NOT GIVEN  Final  STATUS: FINAL  Final   ISOLATE 1: Escherichia coli (A)  Final    Comment: 10,000-50,000 CFU/mL of Escherichia coli      Susceptibility   Escherichia coli - URINE CULTURE, REFLEX    AMOX/CLAVULANIC >=32 Resistant     AMPICILLIN >=32 Resistant     AMPICILLIN/SULBACTAM 16 Intermediate     CEFAZOLIN* 8 Resistant      * For uncomplicated UTI caused by E. coli,K. pneumoniae or P. mirabilis: Cefazolin  issusceptible if MIC <32 mcg/mL and predictssusceptible to the oral agents cefaclor, cefdinir,cefpodoxime, cefprozil, cefuroxime, cephalexinand loracarbef.    CEFEPIME <=1 Sensitive     CEFTRIAXONE <=1 Sensitive     CIPROFLOXACIN <=0.25 Sensitive     LEVOFLOXACIN <=0.12 Sensitive     ERTAPENEM <=0.5 Sensitive     GENTAMICIN <=1 Sensitive     IMIPENEM <=0.25 Sensitive     NITROFURANTOIN <=16 Sensitive     PIP/TAZO <=4 Sensitive     TOBRAMYCIN <=1 Sensitive     TRIMETH/SULFA* <=20 Sensitive      * For uncomplicated UTI caused by E. coli,K. pneumoniae or P. mirabilis: Cefazolin issusceptible if MIC <32 mcg/mL and predictssusceptible to the oral agents cefaclor, cefdinir,cefpodoxime, cefprozil, cefuroxime, cephalexinand loracarbef.Legend:S = Susceptible  I = IntermediateR = Resistant  NS = Not susceptible* = Not tested  NR = Not reported**NN = See antimicrobic comments    Radiology Reports Ct Head Wo Contrast  Result Date: 07/18/2017 CLINICAL DATA:  Garbled speech and abnormal gait EXAM: CT HEAD WITHOUT CONTRAST TECHNIQUE: Contiguous axial images were obtained from the base of the skull through the vertex without intravenous contrast. COMPARISON:  CT brain 06/13/2017 FINDINGS: Brain: No hemorrhage is visualized. Increased hypodensity within the right greater than left parietal white matter consistent with edema. 9 mm suspected hypodense mass with central focus of hemorrhage or calcification suspicious for metastatic disease. Edema in the right occipital lobe. Moderate atrophy. Stable ventricle size. Increased hypodensity in the right thalamus compared to prior. Possible hypodense area of edema within the left cerebellar peduncle. Fourth ventricle is patent. Basilar cisterns patent. Vascular: No hyperdense vessels.  Carotid vascular calcification Skull: No fracture. Sinuses/Orbits: No acute finding. Other: None IMPRESSION: 1. Moderate hypodensity within the right greater than left parietal white matter  with suspected 9 mm hypodense mass in the right white matter with punctate central focus of hemorrhage or calcification, concerning for metastatic disease. Edema also present in the right occipital lobe and is suspected within the left cerebellum and right thalamus. Recommend MRI of the brain with and without contrast. 2. Atrophy and mild small vessel ischemic changes of the white matter. Electronically Signed   By: Donavan Foil M.D.   On: 07/18/2017 18:35   Ct Chest Wo Contrast  Result Date: 06/29/2017 CLINICAL DATA:  Lung cancer. Chest pain, cough and shortness of breath. EXAM: CT CHEST WITHOUT CONTRAST TECHNIQUE: Multidetector CT imaging of the chest was performed following the standard protocol without IV contrast. COMPARISON:  08/13/2016. FINDINGS: Cardiovascular: Atherosclerotic calcification of the arterial vasculature, including coronary arteries. Heart size normal. No pericardial effusion. Mediastinum/Nodes: Mediastinal lymph nodes are not enlarged by CT size criteria. Hilar regions are difficult to evaluate without IV contrast. No axillary adenopathy. Esophagus is grossly unremarkable. Lungs/Pleura: 0.8 x 1.5 cm nodule in the apical segment right upper lobe is stable. Centrilobular emphysema. Masslike consolidation in the apical left upper lobe has enlarged, measuring approximately 2.9 x 6.0 cm, previously 1.6 x 4.8 cm, with adjacent nodularity and increasing volume loss. Brachytherapy  seeds are seen in the anterior left upper lobe. 6 mm right lower lobe nodule (image 90), stable. Trace loculated left apical pleural fluid. Airway is unremarkable. Upper Abdomen: Visualized portions of the liver and right adrenal gland are unremarkable. Slight thickening of the lateral limb left adrenal gland appears new. Visualized portion of the right kidney is unremarkable. Low-attenuation lesion in the upper pole left kidney measures 2.1 cm, difficult to further characterize without post-contrast imaging. Visualized  portions of the spleen and pancreas are unremarkable. Tiny hiatal hernia. Cholecystectomy. No upper abdominal adenopathy. Musculoskeletal: Degenerative changes in the spine. No worrisome lytic or sclerotic lesions. IMPRESSION: 1. Enlarging masslike consolidation in the left upper lobe and new thickening of the lateral limb left adrenal gland, findings highly worrisome for disease progression. 2. Right upper and right lower lobe nodules are stable. 3. Aortic atherosclerosis (ICD10-170.0). Coronary artery calcification. 4.  Emphysema (ICD10-J43.9). Electronically Signed   By: Lorin Picket M.D.   On: 06/29/2017 07:35   Mr Brain Wo Contrast  Result Date: 07/18/2017 CLINICAL DATA:  Speech difficulty EXAM: MRI HEAD WITHOUT CONTRAST TECHNIQUE: Multiplanar, multiecho pulse sequences of the brain and surrounding structures were obtained without intravenous contrast. COMPARISON:  None. FINDINGS: The examination had to be discontinued prior to completion due to patient request to terminate the study early. Axial and coronal diffusion-weighted imaging, corresponding ADC maps, sagittal T1-weighted imaging and axial T2-weighted imaging were acquired. No contrast was administered. Brain: There are numerous lesions throughout the brain that show peripheral diffusion restriction. There is no central diffusion restriction to suggest abscess. These are most consistent with metastases. The largest lesion is in the right parietal white matter measures 2.0 x 1.9 cm. There are at least 20 lesions. Edema is worst surrounding right parietal and left occipital lesions. No midline shift. No hydrocephalus. Vascular: Major intracranial arterial and venous sinus flow voids are preserved. Skull and upper cervical spine: Normal marrow signal. Sinuses/Orbits: Paranasal sinuses are clear. No mastoid or middle ear effusion. Bilateral lens replacements. Other: None. IMPRESSION: 1. Truncated examination. No intravenous contrast material was  administered. 2. Numerous (at least 20) metastases throughout the brain, most of which are centrally cystic or necrotic. 3. Moderate edema surrounding the right parietal and left occipital largest lesions. No associated midline shift or hydrocephalus. Electronically Signed   By: Ulyses Jarred M.D.   On: 07/18/2017 22:53   Nm Pet Image Restag (ps) Skull Base To Thigh  Result Date: 07/13/2017 CLINICAL DATA:  Subsequent treatment strategy for left upper lobe lung cancer. EXAM: NUCLEAR MEDICINE PET SKULL BASE TO THIGH TECHNIQUE: 10.7 mCi F-18 FDG was injected intravenously. Full-ring PET imaging was performed from the skull base to thigh after the radiotracer. CT data was obtained and used for attenuation correction and anatomic localization. Fasting blood glucose: 130 mg/dl Mediastinal blood pool activity: SUV max 3.3 COMPARISON:  11/11/2015 PET-CT, and chest CT from 06/28/2017 FINDINGS: NECK: Activity in neck musculature and along the pharynx is thought to likely be physiologic. Incidental CT findings: none CHEST: Hypermetabolic left upper lobe suprahilar mass has notably enlarged compared to 11/11/2015, and is roughly stable to the exam from earlier this month, measuring approximately 4.5 by 2.9 cm with maximum SUV 28.8. Previously the maximum SUV in this vicinity was 11.8. A posterior left hilar lymph node measuring 10 mm in diameter has a maximum SUV of 11.4. An indistinct AP window lymph node measuring about 1.3 cm in short axis has a maximum SUV of 13.1. Postoperative and post therapy related  findings are present in the left lung. There is a small amount of metabolic activity along the right apical pleuroparenchymal scarring, unchanged in size from 06/28/2017, maximum SUV 2.6 and previously 2.8 back on 11/11/2015. A chronically stable 6 mm right lower lobe nodule on image 41/8 is not appreciably hypermetabolic today. Incidental CT findings: Coronary, aortic arch, and branch vessel atherosclerotic vascular  disease. ABDOMEN/PELVIS: Hypermetabolic 1.0 by 2.3 cm mass of the lateral limb left adrenal gland, maximum SUV 26.8. Hypermetabolic small nodule of the lateral limb right adrenal gland, maximum SUV 6.1. A hypermetabolic nodule within or along the body of the pancreas has a maximum SUV of 13.9 and measures 1.4 cm in diameter. An adjacent pancreatic body hypermetabolic focus is similarly sized and has a maximum SUV of 9.1. Small hypermetabolic left pelvic sidewall lymph nodes are present, measuring up to 8 mm in diameter and with maximum SUV 14.7 Physiologic uptake is present in bowel. There are several unusual intra-metastatic lesions including a small left upper quadrant omental focus with SUV 5.7 measuring up to 7 mm in diameter as well as 2 small lesions in the left perirenal space. Incidental CT findings: Aortoiliac atherosclerotic vascular disease. Sigmoid colon diverticulosis. SKELETON: Multiple musculoskeletal metastatic lesions are present, including a suspected lesion in the left acromion with maximum SUV 30.7; a tiny nodule in the right lateral subcutaneous tissues of the thorax with maximum SUV 4.0; a lesion in the right gluteus medius muscle with maximum SUV 33.5; a lesion just posterior to the upper sacrum with maximum SUV 10.1; and small lesions along the left sciatic notch. Questionable lesion in the left iliac bone adjacent to the SI joint, maximum SUV 4.6. Incidental CT findings: none IMPRESSION: 1. Left upper lobe suprahilar hypermetabolic mass measuring about 4.5 by 2.9 cm, with associated metastatic disease to ipsilateral mediastinal lymph nodes; both adrenal glands; the pancreas; the left perirenal space; the left upper quadrant omentum; and several locations along muscular and subcutaneous tissues. Bony hypermetabolic lesions in the left acromion and perhaps the left iliac bone. 2. Other imaging findings of potential clinical significance: Aortic Atherosclerosis (ICD10-I70.0). Coronary  atherosclerosis. Sigmoid colon diverticulosis. Electronically Signed   By: Van Clines M.D.   On: 07/13/2017 16:39   Dg Abd Acute W/chest  Result Date: 06/27/2017 CLINICAL DATA:  Lower abdominal pain, vomiting, constipation. History of lung cancer. EXAM: DG ABDOMEN ACUTE W/ 1V CHEST COMPARISON:  Chest radiographs dated 03/02/2017. Chest/abdominal radiographs dated 09/01/2016. FINDINGS: Stable nodular density at the right lung apex. Stable postsurgical changes in the left upper lobe with brachytherapy seeds in the left upper hemithorax. Additional ill-defined opacity in the medial left upper hemithorax, better evaluated on prior CT. No focal consolidation.  No pleural effusion or pneumothorax. The heart is normal in size. Nonobstructive bowel gas pattern. No evidence of free air under the diaphragm on the upright view. Cholecystectomy clips. Mild degenerative changes of the lower lumbar spine. IMPRESSION: Stable postsurgical and postprocedural changes in the left hemithorax. Stable nodular density in the right lung apex and ill-defined opacity in the medial left upper hemithorax. No evidence of small bowel obstruction or free air. Electronically Signed   By: Julian Hy M.D.   On: 06/27/2017 16:58     CBC Recent Labs  Lab 07/18/17 1812 07/18/17 1837  WBC  --  10.5  HGB 13.3 13.0  HCT 39.0 43.2  PLT  --  285  MCV  --  94.5  MCH  --  28.4  MCHC  --  30.1  RDW  --  14.3  LYMPHSABS  --  2.4  MONOABS  --  0.8  EOSABS  --  0.3  BASOSABS  --  0.1    Chemistries  Recent Labs  Lab 07/18/17 1812 07/18/17 1837  NA 142 146*  K 3.8 4.0  CL 99* 99*  CO2  --  34*  GLUCOSE 107* 112*  BUN 16 13  CREATININE 0.80 0.90  CALCIUM  --  9.2  AST  --  24  ALT  --  25  ALKPHOS  --  94  BILITOT  --  0.5   ------------------------------------------------------------------------------------------------------------------ estimated creatinine clearance is 52.5 mL/min (by C-G formula based on  SCr of 0.9 mg/dL). ------------------------------------------------------------------------------------------------------------------ No results for input(s): HGBA1C in the last 72 hours. ------------------------------------------------------------------------------------------------------------------ No results for input(s): CHOL, HDL, LDLCALC, TRIG, CHOLHDL, LDLDIRECT in the last 72 hours. ------------------------------------------------------------------------------------------------------------------ No results for input(s): TSH, T4TOTAL, T3FREE, THYROIDAB in the last 72 hours.  Invalid input(s): FREET3 ------------------------------------------------------------------------------------------------------------------ No results for input(s): VITAMINB12, FOLATE, FERRITIN, TIBC, IRON, RETICCTPCT in the last 72 hours.  Coagulation profile Recent Labs  Lab 07/18/17 1837  INR 0.95    No results for input(s): DDIMER in the last 72 hours.  Cardiac Enzymes No results for input(s): CKMB, TROPONINI, MYOGLOBIN in the last 168 hours.  Invalid input(s): CK ------------------------------------------------------------------------------------------------------------------ Invalid input(s): POCBNP   CBG: Recent Labs  Lab 07/13/17 1042 07/18/17 2030  GLUCAP 130* 110*       Studies: Ct Head Wo Contrast  Result Date: 07/18/2017 CLINICAL DATA:  Garbled speech and abnormal gait EXAM: CT HEAD WITHOUT CONTRAST TECHNIQUE: Contiguous axial images were obtained from the base of the skull through the vertex without intravenous contrast. COMPARISON:  CT brain 06/13/2017 FINDINGS: Brain: No hemorrhage is visualized. Increased hypodensity within the right greater than left parietal white matter consistent with edema. 9 mm suspected hypodense mass with central focus of hemorrhage or calcification suspicious for metastatic disease. Edema in the right occipital lobe. Moderate atrophy. Stable ventricle  size. Increased hypodensity in the right thalamus compared to prior. Possible hypodense area of edema within the left cerebellar peduncle. Fourth ventricle is patent. Basilar cisterns patent. Vascular: No hyperdense vessels.  Carotid vascular calcification Skull: No fracture. Sinuses/Orbits: No acute finding. Other: None IMPRESSION: 1. Moderate hypodensity within the right greater than left parietal white matter with suspected 9 mm hypodense mass in the right white matter with punctate central focus of hemorrhage or calcification, concerning for metastatic disease. Edema also present in the right occipital lobe and is suspected within the left cerebellum and right thalamus. Recommend MRI of the brain with and without contrast. 2. Atrophy and mild small vessel ischemic changes of the white matter. Electronically Signed   By: Donavan Foil M.D.   On: 07/18/2017 18:35   Mr Brain Wo Contrast  Result Date: 07/18/2017 CLINICAL DATA:  Speech difficulty EXAM: MRI HEAD WITHOUT CONTRAST TECHNIQUE: Multiplanar, multiecho pulse sequences of the brain and surrounding structures were obtained without intravenous contrast. COMPARISON:  None. FINDINGS: The examination had to be discontinued prior to completion due to patient request to terminate the study early. Axial and coronal diffusion-weighted imaging, corresponding ADC maps, sagittal T1-weighted imaging and axial T2-weighted imaging were acquired. No contrast was administered. Brain: There are numerous lesions throughout the brain that show peripheral diffusion restriction. There is no central diffusion restriction to suggest abscess. These are most consistent with metastases. The largest lesion is in the right parietal white matter measures 2.0 x 1.9 cm.  There are at least 20 lesions. Edema is worst surrounding right parietal and left occipital lesions. No midline shift. No hydrocephalus. Vascular: Major intracranial arterial and venous sinus flow voids are preserved.  Skull and upper cervical spine: Normal marrow signal. Sinuses/Orbits: Paranasal sinuses are clear. No mastoid or middle ear effusion. Bilateral lens replacements. Other: None. IMPRESSION: 1. Truncated examination. No intravenous contrast material was administered. 2. Numerous (at least 20) metastases throughout the brain, most of which are centrally cystic or necrotic. 3. Moderate edema surrounding the right parietal and left occipital largest lesions. No associated midline shift or hydrocephalus. Electronically Signed   By: Ulyses Jarred M.D.   On: 07/18/2017 22:53      Lab Results  Component Value Date   HGBA1C 6.0 04/27/2017   HGBA1C 5.8 10/07/2015   HGBA1C (H) 01/20/2010    6.5 (NOTE)                                                                       According to the ADA Clinical Practice Recommendations for 2011, when HbA1c is used as a screening test:   >=6.5%   Diagnostic of Diabetes Mellitus           (if abnormal result  is confirmed)  5.7-6.4%   Increased risk of developing Diabetes Mellitus  References:Diagnosis and Classification of Diabetes Mellitus,Diabetes [facility],8022,33(KPQAE 1):S62-S69 and Standards of Medical Care in         Diabetes - 2011,Diabetes Care,2011,34  (Suppl 1):S11-S61.   Lab Results  Component Value Date   LDLCALC 53 10/07/2015   CREATININE 0.90 07/18/2017       Scheduled Meds: . aspirin  81 mg Oral Daily  . dexamethasone  6 mg Intravenous Q6H  . donepezil  5 mg Oral QHS  . furosemide  80 mg Oral Daily  . losartan  100 mg Oral Daily  . metoprolol tartrate  25 mg Oral BID  . mometasone-formoterol  2 puff Inhalation BID  . omega-3 acid ethyl esters  1 g Oral Daily  . potassium chloride SA  20 mEq Oral Daily   Continuous Infusions:   LOS: 0 days    Time spent: >30 MINS    Reyne Dumas  [facility] Pager [phone]. If 7PM-7AM, please contact night-coverage at [email or web address], password [facility] 07/19/2017, 12:31 PM  LOS: 0 days

## 2017-07-19 NOTE — Plan of Care (Signed)
  Problem: Education: Goal: Knowledge of General Education information will improve Outcome: Progressing   Problem: Coping: Goal: Level of anxiety will decrease Outcome: Progressing   Problem: Pain Managment: Goal: General experience of comfort will improve Outcome: Progressing

## 2017-07-20 ENCOUNTER — Ambulatory Visit
Admit: 2017-07-20 | Discharge: 2017-07-20 | Disposition: A | Discharge status: Home / Self Care | Payer: Medicare Other | Attending: Radiation Oncology | Admitting: Radiation Oncology

## 2017-07-20 ENCOUNTER — Ambulatory Visit
Admission: RE | Admit: 2017-07-20 | Discharge: 2017-07-20 | Disposition: A | Discharge status: Home / Self Care | Payer: Medicare Other | Source: Ambulatory Visit | Attending: Radiation Oncology | Admitting: Radiation Oncology

## 2017-07-20 DIAGNOSIS — C7931 Secondary malignant neoplasm of brain: Secondary | ICD-10-CM

## 2017-07-20 LAB — GLUCOSE, CAPILLARY: Glucose-Capillary: 190 mg/dL — ABNORMAL HIGH (ref 65–99)

## 2017-07-20 MED ORDER — SODIUM CHLORIDE 0.9 % IV SOLN
500.0000 mg | Freq: Once | INTRAVENOUS | Status: AC
  Administered 2017-07-20: 500 mg via INTRAVENOUS
  Filled 2017-07-20: qty 525

## 2017-07-20 MED ORDER — LORAZEPAM 2 MG/ML IJ SOLN
1.0000 mg | Freq: Once | INTRAMUSCULAR | Status: AC
  Administered 2017-07-20: 1 mg via INTRAVENOUS

## 2017-07-20 MED ORDER — LORAZEPAM 2 MG/ML IJ SOLN
2.0000 mg | Freq: Four times a day (QID) | INTRAMUSCULAR | Status: DC | PRN
  Filled 2017-07-20: qty 1

## 2017-07-20 MED ORDER — LEVETIRACETAM IN NACL 500 MG/100ML IV SOLN
500.0000 mg | Freq: Two times a day (BID) | INTRAVENOUS | Status: DC
  Filled 2017-07-20: qty 100

## 2017-07-20 MED ORDER — LORAZEPAM 2 MG/ML IJ SOLN
INTRAMUSCULAR | Status: AC
  Filled 2017-07-20: qty 1

## 2017-07-20 MED ORDER — LORAZEPAM 2 MG/ML IJ SOLN
1.0000 mg | Freq: Once | INTRAMUSCULAR | Status: AC
  Administered 2017-07-20: 1 mg via INTRAMUSCULAR

## 2017-07-20 MED ORDER — LORAZEPAM 2 MG/ML IJ SOLN: 1.0000 mg | Freq: Four times a day (QID) | INTRAMUSCULAR | Status: DC | PRN

## 2017-07-20 MED ORDER — LEVETIRACETAM IN NACL 500 MG/100ML IV SOLN
500.0000 mg | Freq: Two times a day (BID) | INTRAVENOUS | Status: DC
  Administered 2017-07-21 – 2017-07-23 (×5): 500 mg via INTRAVENOUS
  Filled 2017-07-20 (×6): qty 100

## 2017-07-20 MED ORDER — LEVETIRACETAM IN NACL 1000 MG/100ML IV SOLN
1000.0000 mg | Freq: Once | INTRAVENOUS | Status: AC
  Administered 2017-07-20: 1000 mg via INTRAVENOUS
  Filled 2017-07-20: qty 100

## 2017-07-20 NOTE — Progress Notes (Addendum)
[facility] PROGRESS NOTE  Brandy Cameron DXI:[id number] DOB: 1935/11/14 DOA: 07/18/2017   PCP: [name], MD     Assessment/Plan: Principal Problem:   Brain metastasis [facility]) Active Problems:   CAD (coronary artery disease)   Hypertension   Primary cancer of left upper lobe of lung ([place])   Asthma   COPD (chronic obstructive pulmonary disease) ([place])   Aphasia   Metastatic cancer ([place])   82 y.o. female with history of recurrent non-small cell lung cancer being followed by oncologist who had recent PET scan done for progression of lung cancer started having some diaphoresis , daughter noted that patient was having some difficulty speaking. She was recently diagnosed with recurrent non-small cell lung cancer, adenocarcinoma with positive PDL 1 expression (60%) with left upper lobe as well as right upper lobe pulmonary nodules. She was initially diagnosed with a stage IA (T1a, N0, M0) in in October 2010.MRI of the brain concerning for metastatic lesions. Patient also developed new onset seizures this admission.  Assessment and plan  Metastatic brain lesions likely from patient's known history of non-small cell lung cancer -discussed with  oncologist Dr.  Julien Nordmann who at this time advised to continue with the Decadron IV 6 mg q. 6 hourly. Patient   presented with extensive and widely metastatic disease involving the lung, brain, mediastinal lymph nodes, adrenal glands, pancreas, perirenal space as well as left upper quadrant omentum in addition to muscular, bone and subcutaneous tissues. Oncology  recommended for the patient to consider whole brain irradiation for the multiple metastatic brain lesions, also recommend  CT-guided/ultrasound-guided core biopsy of the right gluteus medius muscle for confirmation of her tissue diagnosis. Based on the final pathology,  will discuss with the patient her treatment options including palliative care versus consideration of palliative  treatment with immunotherapy or a combination of chemotherapy and immunotherapy. Oncology also requested  Port-A-Cath placed during her hospitalization. Patient was noted to have seizure activity this morning and is currently sedated and on IV Keppra.Therefore all procedures on hold currently . IR   plans to proceed with biopsy on 3/28.  Port-A-Cath placement/ timing will be determined at later date.   Dr. Dorathy Daft start radiation treatment  Discussed code status with daughter who is considering DNR but she has not decided yet   New onset seizure  Started on keppra , prn ativan   Chronic diastolic CHF on Lasix which will be continued.  Hypertension on Cozaar and metoprolol.  COPD not actively wheezing continue inhalers.  Hyperlipidemia continue home medications.   DVT prophylaxsis  Lovenox  Code Status:  Full code    Family Communication: Discussed in detail with the patient' s daughter , all imaging results, lab results explained to her, she has decided to make her a DNR since patient has had a seizure and does not want intubation or CPR  Disposition Plan:  Requested radiation oncology and oncology to decide plan of care       Consultants:  Radiation oncology  Oncology  Procedures:  None   Antibiotics: Anti-infectives (From admission, onward)   None         HPI/Subjective: atient appears comfortable denies any headache, nausea, vomiting, blurry vision  Objective: Vitals:   07/20/17 0519 07/20/17 0528 07/20/17 0855 07/20/17 1113  BP: (!) 161/88 (!) 131/103  (!) 141/73  Pulse: (!) 107 (!) 111  92  Resp:    20  Temp: 97.8 F (36.6 C)   97.6 F (  36.4 C)  TempSrc: Oral   Oral  SpO2: 97% 98% 97% 100%  Weight:      Height:        Intake/Output Summary (Last 24 hours) at 07/20/2017 1446 Last data filed at 07/20/2017 0600 Gross per 24 hour  Intake 0 ml  Output 0 ml  Net 0 ml    Exam:  Examination:  General exam: Appears calm and comfortable   Respiratory system: Clear to auscultation. Respiratory effort normal. Cardiovascular system: S1 & S2 heard, RRR. No JVD, murmurs, rubs, gallops or clicks. No pedal edema. Gastrointestinal system: Abdomen is nondistended, soft and nontender. No organomegaly or masses felt. Normal bowel sounds heard. Central nervous system: Alert and oriented. No focal neurological deficits. Extremities: Symmetric 5 x 5 power. Skin: No rashes, lesions or ulcers Psychiatry: Judgement and insight appear normal. Mood & affect appropriate.     Data Reviewed: I have personally reviewed following labs and imaging studies  Micro Results Recent Results (from the past 240 hour(s))  Urine Culture     Status: Abnormal   Collection Time: 07/15/17 11:59 AM  Result Value Ref Range Status   MICRO NUMBER: [id number]  Final   SPECIMEN QUALITY: ADEQUATE  Final   Sample Source NOT GIVEN  Final   STATUS: FINAL  Final   ISOLATE 1: Escherichia coli (A)  Final    Comment: 10,000-50,000 CFU/mL of Escherichia coli      Susceptibility   Escherichia coli - URINE CULTURE, REFLEX    AMOX/CLAVULANIC >=32 Resistant     AMPICILLIN >=32 Resistant     AMPICILLIN/SULBACTAM 16 Intermediate     CEFAZOLIN* 8 Resistant      * For uncomplicated UTI caused by E. coli,K. pneumoniae or P. mirabilis: Cefazolin issusceptible if MIC <32 mcg/mL and predictssusceptible to the oral agents cefaclor, cefdinir,cefpodoxime, cefprozil, cefuroxime, cephalexinand loracarbef.    CEFEPIME <=1 Sensitive     CEFTRIAXONE <=1 Sensitive     CIPROFLOXACIN <=0.25 Sensitive     LEVOFLOXACIN <=0.12 Sensitive     ERTAPENEM <=0.5 Sensitive     GENTAMICIN <=1 Sensitive     IMIPENEM <=0.25 Sensitive     NITROFURANTOIN <=16 Sensitive     PIP/TAZO <=4 Sensitive     TOBRAMYCIN <=1 Sensitive     TRIMETH/SULFA* <=20 Sensitive      * For uncomplicated UTI caused by E. coli,K. pneumoniae or P. mirabilis: Cefazolin issusceptible if MIC <32 mcg/mL and predictssusceptible  to the oral agents cefaclor, cefdinir,cefpodoxime, cefprozil, cefuroxime, cephalexinand loracarbef.Legend:S = Susceptible  I = IntermediateR = Resistant  NS = Not susceptible* = Not tested  NR = Not reported**NN = See antimicrobic comments    Radiology Reports Ct Head Wo Contrast  Result Date: 07/18/2017 CLINICAL DATA:  Garbled speech and abnormal gait EXAM: CT HEAD WITHOUT CONTRAST TECHNIQUE: Contiguous axial images were obtained from the base of the skull through the vertex without intravenous contrast. COMPARISON:  CT brain 06/13/2017 FINDINGS: Brain: No hemorrhage is visualized. Increased hypodensity within the right greater than left parietal white matter consistent with edema. 9 mm suspected hypodense mass with central focus of hemorrhage or calcification suspicious for metastatic disease. Edema in the right occipital lobe. Moderate atrophy. Stable ventricle size. Increased hypodensity in the right thalamus compared to prior. Possible hypodense area of edema within the left cerebellar peduncle. Fourth ventricle is patent. Basilar cisterns patent. Vascular: No hyperdense vessels.  Carotid vascular calcification Skull: No fracture. Sinuses/Orbits: No acute finding. Other: None IMPRESSION: 1. Moderate hypodensity within the right  greater than left parietal white matter with suspected 9 mm hypodense mass in the right white matter with punctate central focus of hemorrhage or calcification, concerning for metastatic disease. Edema also present in the right occipital lobe and is suspected within the left cerebellum and right thalamus. Recommend MRI of the brain with and without contrast. 2. Atrophy and mild small vessel ischemic changes of the white matter. Electronically Signed   By: Donavan Foil M.D.   On: 07/18/2017 18:35   Ct Chest Wo Contrast  Result Date: 06/29/2017 CLINICAL DATA:  Lung cancer. Chest pain, cough and shortness of breath. EXAM: CT CHEST WITHOUT CONTRAST TECHNIQUE: Multidetector CT  imaging of the chest was performed following the standard protocol without IV contrast. COMPARISON:  08/13/2016. FINDINGS: Cardiovascular: Atherosclerotic calcification of the arterial vasculature, including coronary arteries. Heart size normal. No pericardial effusion. Mediastinum/Nodes: Mediastinal lymph nodes are not enlarged by CT size criteria. Hilar regions are difficult to evaluate without IV contrast. No axillary adenopathy. Esophagus is grossly unremarkable. Lungs/Pleura: 0.8 x 1.5 cm nodule in the apical segment right upper lobe is stable. Centrilobular emphysema. Masslike consolidation in the apical left upper lobe has enlarged, measuring approximately 2.9 x 6.0 cm, previously 1.6 x 4.8 cm, with adjacent nodularity and increasing volume loss. Brachytherapy seeds are seen in the anterior left upper lobe. 6 mm right lower lobe nodule (image 90), stable. Trace loculated left apical pleural fluid. Airway is unremarkable. Upper Abdomen: Visualized portions of the liver and right adrenal gland are unremarkable. Slight thickening of the lateral limb left adrenal gland appears new. Visualized portion of the right kidney is unremarkable. Low-attenuation lesion in the upper pole left kidney measures 2.1 cm, difficult to further characterize without post-contrast imaging. Visualized portions of the spleen and pancreas are unremarkable. Tiny hiatal hernia. Cholecystectomy. No upper abdominal adenopathy. Musculoskeletal: Degenerative changes in the spine. No worrisome lytic or sclerotic lesions. IMPRESSION: 1. Enlarging masslike consolidation in the left upper lobe and new thickening of the lateral limb left adrenal gland, findings highly worrisome for disease progression. 2. Right upper and right lower lobe nodules are stable. 3. Aortic atherosclerosis (ICD10-170.0). Coronary artery calcification. 4.  Emphysema (ICD10-J43.9). Electronically Signed   By: Lorin Picket M.D.   On: 06/29/2017 07:35   Mr Brain Wo  Contrast  Result Date: 07/18/2017 CLINICAL DATA:  Speech difficulty EXAM: MRI HEAD WITHOUT CONTRAST TECHNIQUE: Multiplanar, multiecho pulse sequences of the brain and surrounding structures were obtained without intravenous contrast. COMPARISON:  None. FINDINGS: The examination had to be discontinued prior to completion due to patient request to terminate the study early. Axial and coronal diffusion-weighted imaging, corresponding ADC maps, sagittal T1-weighted imaging and axial T2-weighted imaging were acquired. No contrast was administered. Brain: There are numerous lesions throughout the brain that show peripheral diffusion restriction. There is no central diffusion restriction to suggest abscess. These are most consistent with metastases. The largest lesion is in the right parietal white matter measures 2.0 x 1.9 cm. There are at least 20 lesions. Edema is worst surrounding right parietal and left occipital lesions. No midline shift. No hydrocephalus. Vascular: Major intracranial arterial and venous sinus flow voids are preserved. Skull and upper cervical spine: Normal marrow signal. Sinuses/Orbits: Paranasal sinuses are clear. No mastoid or middle ear effusion. Bilateral lens replacements. Other: None. IMPRESSION: 1. Truncated examination. No intravenous contrast material was administered. 2. Numerous (at least 20) metastases throughout the brain, most of which are centrally cystic or necrotic. 3. Moderate edema surrounding the right parietal and  left occipital largest lesions. No associated midline shift or hydrocephalus. Electronically Signed   By: Ulyses Jarred M.D.   On: 07/18/2017 22:53   Nm Pet Image Restag (ps) Skull Base To Thigh  Result Date: 07/13/2017 CLINICAL DATA:  Subsequent treatment strategy for left upper lobe lung cancer. EXAM: NUCLEAR MEDICINE PET SKULL BASE TO THIGH TECHNIQUE: 10.7 mCi F-18 FDG was injected intravenously. Full-ring PET imaging was performed from the skull base to thigh  after the radiotracer. CT data was obtained and used for attenuation correction and anatomic localization. Fasting blood glucose: 130 mg/dl Mediastinal blood pool activity: SUV max 3.3 COMPARISON:  11/11/2015 PET-CT, and chest CT from 06/28/2017 FINDINGS: NECK: Activity in neck musculature and along the pharynx is thought to likely be physiologic. Incidental CT findings: none CHEST: Hypermetabolic left upper lobe suprahilar mass has notably enlarged compared to 11/11/2015, and is roughly stable to the exam from earlier this month, measuring approximately 4.5 by 2.9 cm with maximum SUV 28.8. Previously the maximum SUV in this vicinity was 11.8. A posterior left hilar lymph node measuring 10 mm in diameter has a maximum SUV of 11.4. An indistinct AP window lymph node measuring about 1.3 cm in short axis has a maximum SUV of 13.1. Postoperative and post therapy related findings are present in the left lung. There is a small amount of metabolic activity along the right apical pleuroparenchymal scarring, unchanged in size from 06/28/2017, maximum SUV 2.6 and previously 2.8 back on 11/11/2015. A chronically stable 6 mm right lower lobe nodule on image 41/8 is not appreciably hypermetabolic today. Incidental CT findings: Coronary, aortic arch, and branch vessel atherosclerotic vascular disease. ABDOMEN/PELVIS: Hypermetabolic 1.0 by 2.3 cm mass of the lateral limb left adrenal gland, maximum SUV 26.8. Hypermetabolic small nodule of the lateral limb right adrenal gland, maximum SUV 6.1. A hypermetabolic nodule within or along the body of the pancreas has a maximum SUV of 13.9 and measures 1.4 cm in diameter. An adjacent pancreatic body hypermetabolic focus is similarly sized and has a maximum SUV of 9.1. Small hypermetabolic left pelvic sidewall lymph nodes are present, measuring up to 8 mm in diameter and with maximum SUV 14.7 Physiologic uptake is present in bowel. There are several unusual intra-metastatic lesions  including a small left upper quadrant omental focus with SUV 5.7 measuring up to 7 mm in diameter as well as 2 small lesions in the left perirenal space. Incidental CT findings: Aortoiliac atherosclerotic vascular disease. Sigmoid colon diverticulosis. SKELETON: Multiple musculoskeletal metastatic lesions are present, including a suspected lesion in the left acromion with maximum SUV 30.7; a tiny nodule in the right lateral subcutaneous tissues of the thorax with maximum SUV 4.0; a lesion in the right gluteus medius muscle with maximum SUV 33.5; a lesion just posterior to the upper sacrum with maximum SUV 10.1; and small lesions along the left sciatic notch. Questionable lesion in the left iliac bone adjacent to the SI joint, maximum SUV 4.6. Incidental CT findings: none IMPRESSION: 1. Left upper lobe suprahilar hypermetabolic mass measuring about 4.5 by 2.9 cm, with associated metastatic disease to ipsilateral mediastinal lymph nodes; both adrenal glands; the pancreas; the left perirenal space; the left upper quadrant omentum; and several locations along muscular and subcutaneous tissues. Bony hypermetabolic lesions in the left acromion and perhaps the left iliac bone. 2. Other imaging findings of potential clinical significance: Aortic Atherosclerosis (ICD10-I70.0). Coronary atherosclerosis. Sigmoid colon diverticulosis. Electronically Signed   By: Van Clines M.D.   On: 07/13/2017 16:39  Dg Abd Acute W/chest  Result Date: 06/27/2017 CLINICAL DATA:  Lower abdominal pain, vomiting, constipation. History of lung cancer. EXAM: DG ABDOMEN ACUTE W/ 1V CHEST COMPARISON:  Chest radiographs dated 03/02/2017. Chest/abdominal radiographs dated 09/01/2016. FINDINGS: Stable nodular density at the right lung apex. Stable postsurgical changes in the left upper lobe with brachytherapy seeds in the left upper hemithorax. Additional ill-defined opacity in the medial left upper hemithorax, better evaluated on prior CT.  No focal consolidation.  No pleural effusion or pneumothorax. The heart is normal in size. Nonobstructive bowel gas pattern. No evidence of free air under the diaphragm on the upright view. Cholecystectomy clips. Mild degenerative changes of the lower lumbar spine. IMPRESSION: Stable postsurgical and postprocedural changes in the left hemithorax. Stable nodular density in the right lung apex and ill-defined opacity in the medial left upper hemithorax. No evidence of small bowel obstruction or free air. Electronically Signed   By: Julian Hy M.D.   On: 06/27/2017 16:58     CBC Recent Labs  Lab 07/18/17 1812 07/18/17 1837  WBC  --  10.5  HGB 13.3 13.0  HCT 39.0 43.2  PLT  --  285  MCV  --  94.5  MCH  --  28.4  MCHC  --  30.1  RDW  --  14.3  LYMPHSABS  --  2.4  MONOABS  --  0.8  EOSABS  --  0.3  BASOSABS  --  0.1    Chemistries  Recent Labs  Lab 07/18/17 1812 07/18/17 1837  NA 142 146*  K 3.8 4.0  CL 99* 99*  CO2  --  34*  GLUCOSE 107* 112*  BUN 16 13  CREATININE 0.80 0.90  CALCIUM  --  9.2  AST  --  24  ALT  --  25  ALKPHOS  --  94  BILITOT  --  0.5   ------------------------------------------------------------------------------------------------------------------ estimated creatinine clearance is 52.5 mL/min (by C-G formula based on SCr of 0.9 mg/dL). ------------------------------------------------------------------------------------------------------------------ No results for input(s): HGBA1C in the last 72 hours. ------------------------------------------------------------------------------------------------------------------ No results for input(s): CHOL, HDL, LDLCALC, TRIG, CHOLHDL, LDLDIRECT in the last 72 hours. ------------------------------------------------------------------------------------------------------------------ No results for input(s): TSH, T4TOTAL, T3FREE, THYROIDAB in the last 72 hours.  Invalid input(s):  FREET3 ------------------------------------------------------------------------------------------------------------------ No results for input(s): VITAMINB12, FOLATE, FERRITIN, TIBC, IRON, RETICCTPCT in the last 72 hours.  Coagulation profile Recent Labs  Lab 07/18/17 1837  INR 0.95    No results for input(s): DDIMER in the last 72 hours.  Cardiac Enzymes No results for input(s): CKMB, TROPONINI, MYOGLOBIN in the last 168 hours.  Invalid input(s): CK ------------------------------------------------------------------------------------------------------------------ Invalid input(s): POCBNP   CBG: Recent Labs  Lab 07/18/17 2030 07/20/17 0520  GLUCAP 110* 190*       Studies: Ct Head Wo Contrast  Result Date: 07/18/2017 CLINICAL DATA:  Garbled speech and abnormal gait EXAM: CT HEAD WITHOUT CONTRAST TECHNIQUE: Contiguous axial images were obtained from the base of the skull through the vertex without intravenous contrast. COMPARISON:  CT brain 06/13/2017 FINDINGS: Brain: No hemorrhage is visualized. Increased hypodensity within the right greater than left parietal white matter consistent with edema. 9 mm suspected hypodense mass with central focus of hemorrhage or calcification suspicious for metastatic disease. Edema in the right occipital lobe. Moderate atrophy. Stable ventricle size. Increased hypodensity in the right thalamus compared to prior. Possible hypodense area of edema within the left cerebellar peduncle. Fourth ventricle is patent. Basilar cisterns patent. Vascular: No hyperdense vessels.  Carotid vascular calcification Skull: No fracture.  Sinuses/Orbits: No acute finding. Other: None IMPRESSION: 1. Moderate hypodensity within the right greater than left parietal white matter with suspected 9 mm hypodense mass in the right white matter with punctate central focus of hemorrhage or calcification, concerning for metastatic disease. Edema also present in the right occipital lobe  and is suspected within the left cerebellum and right thalamus. Recommend MRI of the brain with and without contrast. 2. Atrophy and mild small vessel ischemic changes of the white matter. Electronically Signed   By: Donavan Foil M.D.   On: 07/18/2017 18:35   Mr Brain Wo Contrast  Result Date: 07/18/2017 CLINICAL DATA:  Speech difficulty EXAM: MRI HEAD WITHOUT CONTRAST TECHNIQUE: Multiplanar, multiecho pulse sequences of the brain and surrounding structures were obtained without intravenous contrast. COMPARISON:  None. FINDINGS: The examination had to be discontinued prior to completion due to patient request to terminate the study early. Axial and coronal diffusion-weighted imaging, corresponding ADC maps, sagittal T1-weighted imaging and axial T2-weighted imaging were acquired. No contrast was administered. Brain: There are numerous lesions throughout the brain that show peripheral diffusion restriction. There is no central diffusion restriction to suggest abscess. These are most consistent with metastases. The largest lesion is in the right parietal white matter measures 2.0 x 1.9 cm. There are at least 20 lesions. Edema is worst surrounding right parietal and left occipital lesions. No midline shift. No hydrocephalus. Vascular: Major intracranial arterial and venous sinus flow voids are preserved. Skull and upper cervical spine: Normal marrow signal. Sinuses/Orbits: Paranasal sinuses are clear. No mastoid or middle ear effusion. Bilateral lens replacements. Other: None. IMPRESSION: 1. Truncated examination. No intravenous contrast material was administered. 2. Numerous (at least 20) metastases throughout the brain, most of which are centrally cystic or necrotic. 3. Moderate edema surrounding the right parietal and left occipital largest lesions. No associated midline shift or hydrocephalus. Electronically Signed   By: Ulyses Jarred M.D.   On: 07/18/2017 22:53      Lab Results  Component Value Date    HGBA1C 6.0 04/27/2017   HGBA1C 5.8 10/07/2015   HGBA1C (H) 01/20/2010    6.5 (NOTE)                                                                       According to the ADA Clinical Practice Recommendations for 2011, when HbA1c is used as a screening test:   >=6.5%   Diagnostic of Diabetes Mellitus           (if abnormal result  is confirmed)  5.7-6.4%   Increased risk of developing Diabetes Mellitus  References:Diagnosis and Classification of Diabetes Mellitus,Diabetes YPPJ,0932,67(TIWPY 1):S62-S69 and Standards of Medical Care in         Diabetes - 2011,Diabetes Care,2011,34  (Suppl 1):S11-S61.   Lab Results  Component Value Date   LDLCALC 53 10/07/2015   CREATININE 0.90 07/18/2017       Scheduled Meds: . aspirin  81 mg Oral Daily  . dexamethasone  6 mg Intravenous Q6H  . donepezil  5 mg Oral QHS  . furosemide  80 mg Oral Daily  . losartan  100 mg Oral Daily  . metoprolol tartrate  25 mg Oral BID  . mometasone-formoterol  2 puff Inhalation BID  .  omega-3 acid ethyl esters  1 g Oral Daily  . potassium chloride SA  20 mEq Oral Daily   Continuous Infusions: . levETIRAcetam    . [START ON 07/21/2017] levETIRAcetam       LOS: 1 day    Time spent: >30 MINS    Reyne Dumas  [facility] Pager [phone]. If 7PM-7AM, please contact night-coverage at [email or web address], password [facility] 07/20/2017, 2:46 PM  LOS: 1 day

## 2017-07-20 NOTE — Progress Notes (Signed)
Called to L[facility]. Patient complaining of headache. Vitals stable. Instructed therapist to moved forward with xrt since patient is stable. Phoned Maudie Mercury, RN caring for patient on [place] her patient is complaining of a headache despite decadron IV. She verbalized understanding.

## 2017-07-20 NOTE — Progress Notes (Signed)
Pt woke up extremely agitated and aggressive. Pt flailing around in bed and combative with staff. IM ativan ordered by Baltazar Najjar, NP due to loss of IV access. Non-violent restraints applied. Daughter updated on pt's change of status. Will continue to monitor closely.

## 2017-07-20 NOTE — Progress Notes (Signed)
  Radiation Oncology         [phone] ________________________________  Name: Brandy Cameron MRN: [id number]  Date: 07/19/2017  DOB: Mar 21, 1936  SIMULATION AND TREATMENT PLANNING NOTE    ICD-10-CM   1. Brain metastasis ([place]) C79.31     DIAGNOSIS:  82 yo woman with at least 20 brain mets from lung cancer  NARRATIVE:  The patient was brought to the [place].  Identity was confirmed.  All relevant records and images related to the planned course of therapy were reviewed.  The patient freely provided informed written consent to proceed with treatment after reviewing the details related to the planned course of therapy. The consent form was witnessed and verified by the simulation staff.  Then, the patient was set-up in a stable reproducible  supine position for radiation therapy.  CT images were obtained.  Surface markings were placed.  The CT images were loaded into the planning software.  Then the target and avoidance structures were contoured.  Treatment planning then occurred.  The radiation prescription was entered and confirmed.  Then, I designed and supervised the construction of a total of 3 medically necessary complex treatment devices, including a custom made thermoplastic mask used for immobilization and two complex multileaf collimators to cover the entire intracranial contents, while shielding the eyes and face.  Each [facility] is independently created to account for beam divergence.  The right and left lateral fields will be treated with 6 MV X-rays.  I have requested : Isodose Plan.    PLAN:  The whole brain will be treated to 30 Gy in 10 fractions.  ________________________________  Sheral Apley Tammi Klippel, M.D.

## 2017-07-20 NOTE — Progress Notes (Addendum)
Upon morning rounds pt with decreased consciousness and unable to follow commands. Acute change from previous assessments. RN noticed flicker of right foot and pt favoring right side. Pt began to have a seizure. Pt turned to side to ensure open airway. Rapid response and on call provider called to bedside. Seizure pads applied and suction set up to provide safety. Will continue to monitor.

## 2017-07-20 NOTE — Consult Note (Signed)
Radiation Oncology         [phone] ________________________________  Initial inpatient Consultation  Name: Brandy Cameron: [id number]  Date of Service: 07/18/2017 DOB: July 05, 1935  IR:SWNIO, Arvid Right, MD  No ref. provider found   REFERRING PHYSICIAN: No ref. provider found Dr. Julien Nordmann  DIAGNOSIS: The primary encounter diagnosis was Metastatic cancer ([place]). Diagnoses of Brain metastasis ([place]), Aphasia, Non-small cell carcinoma of right lung, stage 4 (HCC), and Non-small cell carcinoma of right lung, stage 4 (HCC) were also pertinent to this visit.    ICD-10-CM   1. Metastatic cancer ([place]) C79.9   2. Brain metastasis ([place]) C79.31   3. Aphasia R47.01   4. Non-small cell carcinoma of right lung, stage 4 (HCC) C34.91 CT BIOPSY    IR Fluoro Guide CV Line Right    CT BIOPSY    IR Fluoro Guide CV Line Right  5. Non-small cell carcinoma of right lung, stage 4 (HCC) C34.91 CT BIOPSY    IR Fluoro Guide CV Line Right    CT BIOPSY    IR Fluoro Guide CV Line Right    HISTORY OF PRESENT ILLNESS: Brandy Cameron is a 82 y.o. female seen at the request of Dr. Julien Nordmann.  Patient seen in the radiation oncology CT simulation suite and appears moderately sedated but comfortable.  For this reason, her medical history is obtained from her medical record and confirmed verbally via phone with her daughter.  She has a history of non-small cell lung cancer, adenocarcinoma of the left upper lobe, initially diagnosed as Stage Ia (T1a, N0,M0) in October 2010 and treated with wedge resection as well as adjuvant radiotherapy with brachytherapy by Dr. Servando Snare and Dr. Tammi Klippel.  She has been followed in observation since that time.  We saw her in consult in July 2017 for consideration of SBRT to a RUL lesion, concerning for recurrent disease, but the patient declined radiotherapy at that time due to concerns with decreasing her pulmonary function in light of significant underlying COPD.    Since that time, a  recent CT chest on 06/28/17 was concerning for disease recurrence with an enlarging masslike consolidation in the left upper lobe measuring approximately 2.9 x 6.0 cm, previously measured at 1.6 x 4.8 cm in 07/2016, with adjacent nodularity and increasing volume loss.  Additional bilateral pulmonary nodules appeared stable but there was a new concern with thickening of the lateral limb of the left adrenal gland.  A PET/CT was performed on 07/13/2017 for further evaluation which confirmed the left upper lobe suprahilar hypermetabolic mass measuring 4.5 x 2.9 cm with associated metastatic disease to the ipsilateral mediastinal lymph nodes, both adrenal glands, the pancreas, left perirenal space, left upper quadrant omentum and several muscular and subcutaneous lesions.  Additionally, bony hypermetabolic lesions were noted in the left acromium and left iliac bone.  More recently, on 07/18/2017, the patient was admitted to [facility] with complaints of confusion and difficulty with speech as well as progressive weakness in the lower extremities.  She was also noted to have nausea and vomiting.  CT scan of the head at the time of admission revealed a 9 mm hypodense mass in the right white matter with punctate central focus of hemorrhage or calcification, concerning for metastatic disease. Edema also present in the right occipital lobe and is suspected within the left cerebellum and right thalamus.  Further evaluation with MRI brain revealed numerous (at least 20) metastatic lesions throughout the brain, most of which are centrally  cystic or necrotic.  There was moderate edema surrounding the largest right parietal and left occipital lesions.  She was started on IV Decadron 6 mg every 6 hours and had noted improvement in the weakness and speech within 24 hours.  However, in the evening of 07/19/2017, she experienced a seizure lasting approximately 1 minute followed by postictal phase.  This was treated with Ativan  and she has not had further seizure activity to her daughter's knowledge.  We are asked to consult for consideration of whole brain radiotherapy in the treatment of her metastatic brain disease.  She is scheduled for ultrasound-guided biopsy of a right gluteal soft tissue mass to update tissue diagnosis as well as obtain molecular markers to help guide further systemic treatment options.  PREVIOUS RADIATION THERAPY: Yes adjuvant chest radiotherapy with brachytherapy in 2010  PAST MEDICAL HISTORY:  Past Medical History:  Diagnosis Date  . Asthma   . CAD (coronary artery disease)    a. LHC 9/11 (in setting of NSTEMI): LAD irregs, mLCx 50, pRCA 50, inf-apical AK;  b. Myoview 9/13: Normal stress nuclear study.  LV Ejection Fraction: 71%   . Carotid stenosis    a. Carotid US 9/13: bilat 0-39%;  b. Carotid US 7/15: no sig stenosis  . Chronic diastolic CHF (congestive heart failure) ([place])    a. Echo 7/15: Mild LVH, mild focal basal septal hypertrophy, EF 50-55%, no RWMA, Gr 1 DD  . COPD (chronic obstructive pulmonary disease) ([place])   . HTN (hypertension)   . Lung cancer [facility])    [name]  Marland Kitchen NSTEMI (non-ST elevated myocardial infarction) ([place])    with only mild to moderate CAD in September of 2011,There was concern for apival ballooning. >> ? Tako-Tsubo syndrome vs vasospasm      PAST SURGICAL HISTORY: Past Surgical History:  Procedure Laterality Date  . CATARACT SURGERY    . FOOT SURGERY    . GALLBLADDER SURGERY    . KNEE SURGERY    . LUNG LOBECTOMY    . VESICOVAGINAL FISTULA CLOSURE W/ TAH      FAMILY HISTORY:  Family History  Problem Relation Age of Onset  . Heart disease Mother 42  . Asthma Mother   . Diabetes Mother   . Cancer Mother        [place]  . Emphysema Mother   . Heart disease Unknown        POSITIVE FAMILY HX. OF   . Pancreatitis Unknown        QUESTIONABLE FROM ACE INHIBITORS    . COPD Sister   . Dementia Brother   . Stroke Brother     SOCIAL HISTORY:  Social History   Socioeconomic History  . Marital status: Divorced    Spouse name: Not on file  . Number of children: Not on file  . Years of education: Not on file  . Highest education level: Not on file  Occupational History  . Not on file  Social Needs  . Financial resource strain: Not on file  . Food insecurity:    Worry: Not on file    Inability: Not on file  . Transportation needs:    Medical: Not on file    Non-medical: Not on file  Tobacco Use  . Smoking status: Former Smoker    Packs/day: 1.50    Years: 42.00    Pack years: 63.00    Types: Cigarettes  Last attempt to quit: 04/26/2006    Years since quitting: 11.2  . Smokeless tobacco: Never Used  Substance and Sexual Activity  . Alcohol use: No    Alcohol/week: 0.0 oz  . Drug use: No  . Sexual activity: Never  Lifestyle  . Physical activity:    Days per week: Not on file    Minutes per session: Not on file  . Stress: Not on file  Relationships  . Social connections:    Talks on phone: Not on file    Gets together: Not on file    Attends religious service: Not on file    Active member of club or organization: Not on file    Attends meetings of clubs or organizations: Not on file    Relationship status: Not on file  . Intimate partner violence:    Fear of current or ex partner: Not on file    Emotionally abused: Not on file    Physically abused: Not on file    Forced sexual activity: Not on file  Other Topics Concern  . Not on file  Social History Narrative   Originally from [place]. Always lived in [place]. Prior travel to [facility]. 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 Has a dog at home. No birds, mold, or hot tub exposure. No indoor plants. No carpet in her home. Does have draperies.     ALLERGIES: Augmentin [amoxicillin-pot clavulanate]; Sulfa antibiotics; Tape; Amlodipine; Atorvastatin;  Pantoprazole; and Sulfonamide derivatives  MEDICATIONS:  Current Facility-Administered Medications  Medication Dose Route Frequency Provider Last Rate Last Dose  . acetaminophen (TYLENOL) tablet 650 mg  650 mg Oral Q6H PRN [name], MD       Or  . acetaminophen (TYLENOL) suppository 650 mg  650 mg Rectal Q6H PRN [name], MD      . albuterol (PROVENTIL) (2.5 MG/3ML) 0.083% nebulizer solution 2.5 mg  2.5 mg Nebulization Q4H PRN [name], MD      . aspirin chewable tablet 81 mg  81 mg Oral Daily [name], MD   81 mg at 07/19/17 0941  . dexamethasone (DECADRON) injection 6 mg  6 mg Intravenous Q6H [name], MD   6 mg at 07/20/17 0005  . donepezil (ARICEPT) tablet 5 mg  5 mg Oral QHS [name], MD   5 mg at 07/19/17 2257  . enoxaparin (LOVENOX) injection 40 mg  40 mg Subcutaneous Q24H Abrol, Ascencion Dike, MD   40 mg at 07/19/17 1900  . furosemide (LASIX) tablet 80 mg  80 mg Oral Daily [name], MD   80 mg at 07/19/17 0941  . LORazepam (ATIVAN) injection 2 mg  2 mg Intravenous Q6H PRN Gardiner Barefoot, NP      . losartan (COZAAR) tablet 100 mg  100 mg Oral Daily [name], MD   100 mg at 07/19/17 0941  . metoprolol tartrate (LOPRESSOR) tablet 25 mg  25 mg Oral BID [name], MD   25 mg at 07/19/17 2257  . mometasone-formoterol (DULERA) 200-5 MCG/ACT inhaler 2 puff  2 puff Inhalation BID [name], MD   2 puff at 07/20/17 0855  . nitroGLYCERIN (NITROSTAT) SL tablet 0.4 mg  0.4 mg Sublingual Q5 min PRN [name], MD      . omega-3 acid ethyl esters (LOVAZA) capsule 1 g  1 g Oral Daily [name], MD   1 g at 07/19/17  0941  . ondansetron (ZOFRAN) tablet 4 mg  4 mg Oral Q6H PRN [name], MD       Or  . ondansetron [facility]) injection 4 mg  4 mg Intravenous Q6H PRN [name], MD      . potassium chloride SA (K-DUR,KLOR-CON) CR tablet 20 mEq  20 mEq  Oral Daily [name], MD   20 mEq at 07/19/17 0941    REVIEW OF SYSTEMS:  Patient sedated so history is obtained from medical record and confirmed with daughter via phone.  On review of systems, the patient has been doing well overall but recently developed confusion, difficulty with speech as well as generalized weakness. She denies any chest pain, increased shortness of breath, increased cough, hemoptysis, fevers, chills, night sweats, or unintended weight changes. She denies any bowel or bladder disturbances, and denies abdominal pain, nausea or vomiting.  She was having nausea and vomiting prior to admission but has not had any since that time.  She denies any new musculoskeletal or joint aches or pains. A complete review of systems is obtained and is otherwise negative.  PHYSICAL EXAM:  Wt Readings from Last 3 Encounters:  07/19/17 209 lb 10.5 oz (95.1 kg)  07/15/17 211 lb 1.3 oz (95.7 kg)  07/07/17 213 lb (96.6 kg)   Temp Readings from Last 3 Encounters:  07/20/17 97.8 F (36.6 C) (Oral)  07/15/17 98 F (36.7 C) (Oral)  07/04/17 98 F (36.7 C) (Oral)   BP Readings from Last 3 Encounters:  07/20/17 (!) 131/103  07/15/17 136/78  07/07/17 126/70   Pulse Readings from Last 3 Encounters:  07/20/17 (!) 111  07/15/17 95  07/07/17 83   Pain Assessment Pain Score: 0-No pain/10  In general this is a well appearing elderly caucasian female in no acute distress. She is arousable and responsive but sedated and disoriented. HEENT reveals that the patient is normocephalic, atraumatic. EOMs are intact. PERRLA. Skin is intact without any evidence of gross lesions. Cardiovascular exam reveals a regular rate and rhythm, no clicks rubs or murmurs are auscultated. Chest is clear to auscultation bilaterally. Lymphatic assessment is performed and does not reveal any adenopathy in the cervical, supraclavicular, axillary, or inguinal chains. Abdomen has active bowel sounds in all quadrants  and is intact. The abdomen is soft, non tender, non distended. Lower extremities are negative for pretibial pitting edema, deep calf tenderness, cyanosis or clubbing.   KPS = 30  100 - Normal; no complaints; no evidence of disease. 90   - Able to carry on normal activity; minor signs or symptoms of disease. 80   - Normal activity with effort; some signs or symptoms of disease. 55   - Cares for self; unable to carry on normal activity or to do active work. 60   - Requires occasional assistance, but is able to care for most of his personal needs. 50   - Requires considerable assistance and frequent medical care. 46   - Disabled; requires special care and assistance. 56   - Severely disabled; hospital admission is indicated although death not imminent. 71   - Very sick; hospital admission necessary; active supportive treatment necessary. 10   - Moribund; fatal processes progressing rapidly. 0     - Dead  Karnofsky DA, Abelmann WH, Craver LS and Burchenal [facility] [phone]) The use of the nitrogen mustards in the palliative treatment of carcinoma: with particular reference to bronchogenic carcinoma Cancer 1 634-56  LABORATORY DATA:  Lab Results  Component Value Date   WBC 10.5 07/18/2017   HGB 13.0 07/18/2017   HCT 43.2 07/18/2017   MCV 94.5 07/18/2017   PLT 285 07/18/2017   Lab Results  Component Value Date   NA 146 (H) 07/18/2017   K 4.0 07/18/2017   CL 99 (L) 07/18/2017   CO2 34 (H) 07/18/2017   Lab Results  Component Value Date   ALT 25 07/18/2017   AST 24 07/18/2017   ALKPHOS 94 07/18/2017   BILITOT 0.5 07/18/2017     RADIOGRAPHY: Ct Head Wo Contrast  Result Date: 07/18/2017 CLINICAL DATA:  Garbled speech and abnormal gait EXAM: CT HEAD WITHOUT CONTRAST TECHNIQUE: Contiguous axial images were obtained from the base of the skull through the vertex without intravenous contrast. COMPARISON:  CT brain 06/13/2017 FINDINGS: Brain: No hemorrhage is visualized. Increased hypodensity  within the right greater than left parietal white matter consistent with edema. 9 mm suspected hypodense mass with central focus of hemorrhage or calcification suspicious for metastatic disease. Edema in the right occipital lobe. Moderate atrophy. Stable ventricle size. Increased hypodensity in the right thalamus compared to prior. Possible hypodense area of edema within the left cerebellar peduncle. Fourth ventricle is patent. Basilar cisterns patent. Vascular: No hyperdense vessels.  Carotid vascular calcification Skull: No fracture. Sinuses/Orbits: No acute finding. Other: None IMPRESSION: 1. Moderate hypodensity within the right greater than left parietal white matter with suspected 9 mm hypodense mass in the right white matter with punctate central focus of hemorrhage or calcification, concerning for metastatic disease. Edema also present in the right occipital lobe and is suspected within the left cerebellum and right thalamus. Recommend MRI of the brain with and without contrast. 2. Atrophy and mild small vessel ischemic changes of the white matter. Electronically Signed   By: Donavan Foil M.D.   On: 07/18/2017 18:35   Ct Chest Wo Contrast  Result Date: 06/29/2017 CLINICAL DATA:  Lung cancer. Chest pain, cough and shortness of breath. EXAM: CT CHEST WITHOUT CONTRAST TECHNIQUE: Multidetector CT imaging of the chest was performed following the standard protocol without IV contrast. COMPARISON:  08/13/2016. FINDINGS: Cardiovascular: Atherosclerotic calcification of the arterial vasculature, including coronary arteries. Heart size normal. No pericardial effusion. Mediastinum/Nodes: Mediastinal lymph nodes are not enlarged by CT size criteria. Hilar regions are difficult to evaluate without IV contrast. No axillary adenopathy. Esophagus is grossly unremarkable. Lungs/Pleura: 0.8 x 1.5 cm nodule in the apical segment right upper lobe is stable. Centrilobular emphysema. Masslike consolidation in the apical left  upper lobe has enlarged, measuring approximately 2.9 x 6.0 cm, previously 1.6 x 4.8 cm, with adjacent nodularity and increasing volume loss. Brachytherapy seeds are seen in the anterior left upper lobe. 6 mm right lower lobe nodule (image 90), stable. Trace loculated left apical pleural fluid. Airway is unremarkable. Upper Abdomen: Visualized portions of the liver and right adrenal gland are unremarkable. Slight thickening of the lateral limb left adrenal gland appears new. Visualized portion of the right kidney is unremarkable. Low-attenuation lesion in the upper pole left kidney measures 2.1 cm, difficult to further characterize without post-contrast imaging. Visualized portions of the spleen and pancreas are unremarkable. Tiny hiatal hernia. Cholecystectomy. No upper abdominal adenopathy. Musculoskeletal: Degenerative changes in the spine. No worrisome lytic or sclerotic lesions. IMPRESSION: 1. Enlarging masslike consolidation in the left upper lobe and new thickening of the lateral limb left adrenal gland, findings highly worrisome for disease progression. 2. Right upper and right lower lobe nodules are stable. 3. Aortic atherosclerosis (ICD10-170.0). Coronary  artery calcification. 4.  Emphysema (ICD10-J43.9). Electronically Signed   By: Lorin Picket M.D.   On: 06/29/2017 07:35   Mr Brain Wo Contrast  Result Date: 07/18/2017 CLINICAL DATA:  Speech difficulty EXAM: MRI HEAD WITHOUT CONTRAST TECHNIQUE: Multiplanar, multiecho pulse sequences of the brain and surrounding structures were obtained without intravenous contrast. COMPARISON:  None. FINDINGS: The examination had to be discontinued prior to completion due to patient request to terminate the study early. Axial and coronal diffusion-weighted imaging, corresponding ADC maps, sagittal T1-weighted imaging and axial T2-weighted imaging were acquired. No contrast was administered. Brain: There are numerous lesions throughout the brain that show peripheral  diffusion restriction. There is no central diffusion restriction to suggest abscess. These are most consistent with metastases. The largest lesion is in the right parietal white matter measures 2.0 x 1.9 cm. There are at least 20 lesions. Edema is worst surrounding right parietal and left occipital lesions. No midline shift. No hydrocephalus. Vascular: Major intracranial arterial and venous sinus flow voids are preserved. Skull and upper cervical spine: Normal marrow signal. Sinuses/Orbits: Paranasal sinuses are clear. No mastoid or middle ear effusion. Bilateral lens replacements. Other: None. IMPRESSION: 1. Truncated examination. No intravenous contrast material was administered. 2. Numerous (at least 20) metastases throughout the brain, most of which are centrally cystic or necrotic. 3. Moderate edema surrounding the right parietal and left occipital largest lesions. No associated midline shift or hydrocephalus. Electronically Signed   By: Ulyses Jarred M.D.   On: 07/18/2017 22:53   Nm Pet Image Restag (ps) Skull Base To Thigh  Result Date: 07/13/2017 CLINICAL DATA:  Subsequent treatment strategy for left upper lobe lung cancer. EXAM: NUCLEAR MEDICINE PET SKULL BASE TO THIGH TECHNIQUE: 10.7 mCi F-18 FDG was injected intravenously. Full-ring PET imaging was performed from the skull base to thigh after the radiotracer. CT data was obtained and used for attenuation correction and anatomic localization. Fasting blood glucose: 130 mg/dl Mediastinal blood pool activity: SUV max 3.3 COMPARISON:  11/11/2015 PET-CT, and chest CT from 06/28/2017 FINDINGS: NECK: Activity in neck musculature and along the pharynx is thought to likely be physiologic. Incidental CT findings: none CHEST: Hypermetabolic left upper lobe suprahilar mass has notably enlarged compared to 11/11/2015, and is roughly stable to the exam from earlier this month, measuring approximately 4.5 by 2.9 cm with maximum SUV 28.8. Previously the maximum SUV  in this vicinity was 11.8. A posterior left hilar lymph node measuring 10 mm in diameter has a maximum SUV of 11.4. An indistinct AP window lymph node measuring about 1.3 cm in short axis has a maximum SUV of 13.1. Postoperative and post therapy related findings are present in the left lung. There is a small amount of metabolic activity along the right apical pleuroparenchymal scarring, unchanged in size from 06/28/2017, maximum SUV 2.6 and previously 2.8 back on 11/11/2015. A chronically stable 6 mm right lower lobe nodule on image 41/8 is not appreciably hypermetabolic today. Incidental CT findings: Coronary, aortic arch, and branch vessel atherosclerotic vascular disease. ABDOMEN/PELVIS: Hypermetabolic 1.0 by 2.3 cm mass of the lateral limb left adrenal gland, maximum SUV 26.8. Hypermetabolic small nodule of the lateral limb right adrenal gland, maximum SUV 6.1. A hypermetabolic nodule within or along the body of the pancreas has a maximum SUV of 13.9 and measures 1.4 cm in diameter. An adjacent pancreatic body hypermetabolic focus is similarly sized and has a maximum SUV of 9.1. Small hypermetabolic left pelvic sidewall lymph nodes are present, measuring up to 8 mm  in diameter and with maximum SUV 14.7 Physiologic uptake is present in bowel. There are several unusual intra-metastatic lesions including a small left upper quadrant omental focus with SUV 5.7 measuring up to 7 mm in diameter as well as 2 small lesions in the left perirenal space. Incidental CT findings: Aortoiliac atherosclerotic vascular disease. Sigmoid colon diverticulosis. SKELETON: Multiple musculoskeletal metastatic lesions are present, including a suspected lesion in the left acromion with maximum SUV 30.7; a tiny nodule in the right lateral subcutaneous tissues of the thorax with maximum SUV 4.0; a lesion in the right gluteus medius muscle with maximum SUV 33.5; a lesion just posterior to the upper sacrum with maximum SUV 10.1; and small  lesions along the left sciatic notch. Questionable lesion in the left iliac bone adjacent to the SI joint, maximum SUV 4.6. Incidental CT findings: none IMPRESSION: 1. Left upper lobe suprahilar hypermetabolic mass measuring about 4.5 by 2.9 cm, with associated metastatic disease to ipsilateral mediastinal lymph nodes; both adrenal glands; the pancreas; the left perirenal space; the left upper quadrant omentum; and several locations along muscular and subcutaneous tissues. Bony hypermetabolic lesions in the left acromion and perhaps the left iliac bone. 2. Other imaging findings of potential clinical significance: Aortic Atherosclerosis (ICD10-I70.0). Coronary atherosclerosis. Sigmoid colon diverticulosis. Electronically Signed   By: Van Clines M.D.   On: 07/13/2017 16:39   Dg Abd Acute W/chest  Result Date: 06/27/2017 CLINICAL DATA:  Lower abdominal pain, vomiting, constipation. History of lung cancer. EXAM: DG ABDOMEN ACUTE W/ 1V CHEST COMPARISON:  Chest radiographs dated 03/02/2017. Chest/abdominal radiographs dated 09/01/2016. FINDINGS: Stable nodular density at the right lung apex. Stable postsurgical changes in the left upper lobe with brachytherapy seeds in the left upper hemithorax. Additional ill-defined opacity in the medial left upper hemithorax, better evaluated on prior CT. No focal consolidation.  No pleural effusion or pneumothorax. The heart is normal in size. Nonobstructive bowel gas pattern. No evidence of free air under the diaphragm on the upright view. Cholecystectomy clips. Mild degenerative changes of the lower lumbar spine. IMPRESSION: Stable postsurgical and postprocedural changes in the left hemithorax. Stable nodular density in the right lung apex and ill-defined opacity in the medial left upper hemithorax. No evidence of small bowel obstruction or free air. Electronically Signed   By: Julian Hy M.D.   On: 06/27/2017 16:58      IMPRESSION/PLAN: 1. 82 y.o. female with  newly diagnosed brain metastases (>20 lesions) secondary to recurrent non-small cell lung cancer, now with extensive metastatic disease involving the lung, brain, mediastinal lymph nodes, adrenal glands, pancreas, perirenal space, left upper quadrant omentum as well as bony and muscular subcutaneous lesions.  At this point, the patient would potentially benefit from whole brain radiotherapy.  I spoke with her daughter by phone to review the findings and workup thus far. I advised that the patient is not a candidate for stereotactic radiosurgery due to her numerous lesions so the recommendation is to proceed with whole brain radiotherapy. We discussed that whole brain radiotherapy would treat the known metastatic deposits and help provide some reduction of risk for future brain metastases. We also discussed that whole brain radiotherapy carries potential risks including hair loss, subacute somnolence, and neurocognitive changes including a possible reduction in short-term memory. Whole brain radiotherapy also may carry a lower likelihood of tumor control at the treatment sites because of the low-dose used.  We discussed the logistics and delivery of WBRT to 30 Gy delivered with daily treatments over the course  of 2 weeks (10 fxs). Her daughter seems to understand the treatment recommendation and would like to proceed with whole brain radiotherapy.  She provides verbal consent to proceed, as her mother's POA.  Documentation is placed in the patient's medical record and plans are to proceed with CT Simulation this morning in anticipation of beginning treatment later this afternoon. 2. Seizures.  Patient recently experienced witnessed seizure activity on 07/19/2017.  We would recommend starting the patient on Keppra 500 mg twice daily for seizure prophylaxis.    Nicholos Johns, PA-C    Tyler Pita, MD  [facility] Direct Dial: [phone]  Fax: [phone] [email or web address]   Skype  LinkedIn

## 2017-07-20 NOTE — Progress Notes (Signed)
Called to bedside for change in mental status. By the time NP arrived, pt was post generalized seizure activity and was post ictal. + body and head jerking per RNs. + urinary incontinence. Pt now breathing evenly and unlabored with stable VS and O2 sat but unresponsive. Ativan prn seizures. Seizures likely secondary to brain mets. Pt on Decadron.  KJKG, NP [facility]

## 2017-07-20 NOTE — Progress Notes (Addendum)
Referring Physician(s): Mohamed,M  Supervising Physician: Aletta Edouard  Patient Status:  [facility] - In-pt  Chief Complaint:  Metastatic lung cancer  Subjective: Patient familiar to IR service from prior left lung lesion biopsy as well as left parotid lesion biopsy in 2010.  She has a history of adenocarcinoma of the lung and recently presented to the hospital with complaints of confusion as well as difficulty speaking and progressive weakness of lower extremities with nausea and vomiting.  Subsequent imaging has revealed significant hypermetabolic metastatic disease to ipsilateral mediastinal lymph nodes, adrenal glands, pancreas, left perirenal space, left upper quadrant and omentum as well as muscular subcutaneous tissues, bone/brain.  Request now received from oncology for image guided biopsy of a right gluteus medius muscle soft tissue mass as well as potential Port-A-Cath placement at later date.  Patient was noted to have seizure activity this morning and is currently sedated and on IV Keppra. Past Medical History:  Diagnosis Date  . Asthma   . CAD (coronary artery disease)    a. LHC 9/11 (in setting of NSTEMI): LAD irregs, mLCx 50, pRCA 50, inf-apical AK;  b. Myoview 9/13: Normal stress nuclear study.  LV Ejection Fraction: 71%   . Carotid stenosis    a. Carotid US 9/13: bilat 0-39%;  b. Carotid US 7/15: no sig stenosis  . Chronic diastolic CHF (congestive heart failure) ([place])    a. Echo 7/15: Mild LVH, mild focal basal septal hypertrophy, EF 50-55%, no RWMA, Gr 1 DD  . COPD (chronic obstructive pulmonary disease) ([place])   . HTN (hypertension)   . Lung cancer [facility])    [name]  Marland Kitchen NSTEMI (non-ST elevated myocardial infarction) ([place])    with only mild to moderate CAD in September of 2011,There was concern for apival ballooning. >> ? Tako-Tsubo syndrome vs vasospasm     Allergies: Augmentin  [amoxicillin-pot clavulanate]; Sulfa antibiotics; Tape; Amlodipine; Atorvastatin; Pantoprazole; and Sulfonamide derivatives  Medications: Prior to Admission medications   Medication Sig Start Date End Date Taking? Authorizing Provider  albuterol (PROVENTIL) (2.5 MG/3ML) 0.083% nebulizer solution Take 3 mLs (2.5 mg total) by nebulization every 4 (four) hours as needed for wheezing or shortness of breath. 07/08/16  Yes [name], MD  aspirin EC 81 MG tablet Take 81 mg by mouth daily.   Yes [provider]  budesonide-formoterol (SYMBICORT) 160-4.5 MCG/ACT inhaler Inhale 2 puffs into the lungs 2 (two) times daily. 10/29/16  Yes [name], MD  donepezil (ARICEPT) 5 MG tablet Take 1/2 tablet daily for 2 weeks, then increase to 1 tablet daily Patient taking differently: Take 2.5-5 mg by mouth See admin instructions. Start date 07/07/17: Take 1/2 tablet (2.5 mg) daily at bedtime for 2 weeks, then increase to 1 tablet (5 mg) daily at bedtime 07/07/17  Yes Cameron Sprang, MD  furosemide (LASIX) 40 MG tablet Take 2 tablets (80 mg total) by mouth daily. Patient taking differently: Take 40 mg by mouth 2 (two) times daily.  07/13/17  Yes Lelon Perla, MD  losartan (COZAAR) 100 MG tablet Take 1 tablet (100 mg total) by mouth daily. 07/12/17  Yes [name], MD  metoprolol tartrate (LOPRESSOR) 25 MG tablet Take 1 tablet (25 mg total) by mouth 2 (two) times daily. 07/12/17  Yes [name], MD  nitroGLYCERIN (NITROSTAT) 0.4 MG SL tablet Place 0.4 mg under the tongue every 5 (five) minutes  as needed for chest pain.    Yes [provider]  Omega-3 Fatty Acids (FISH OIL PO) Take 1 capsule by mouth daily.    Yes [provider]  OXYGEN Inhale 2 L into the lungs continuous.    Yes [provider]  potassium chloride SA (K-DUR,KLOR-CON) 20 MEQ tablet Take 1 tablet (20 mEq total) by mouth daily. 07/12/17  Yes [name], MD  nitrofurantoin,  macrocrystal-monohydrate, (MACROBID) 100 MG capsule Take 1 capsule (100 mg total) by mouth 2 (two) times daily for 7 days. 07/18/17 31-Jul-2017  Marrian Salvage, FNP  Spacer/Aero-Holding Chambers (AEROCHAMBER MV) inhaler Use as instructed 08/29/15   [name], MD     Vital Signs: BP (!) 141/73 (BP Location: Left Arm)   Pulse 92   Temp 97.6 F (36.4 C) (Oral)   Resp 20   Ht 5\' 1"  (1.549 m)   Wt 209 lb 10.5 oz (95.1 kg)   SpO2 100%   BMI 39.61 kg/m   Physical Exam patient sedated, chest clear to auscultation bilaterally anteriorly.  Heart with regular rate and rhythm.  Abdomen obese, soft, positive bowel sounds; no significant lower extremity edema  Imaging: Ct Head Wo Contrast  Result Date: 07/18/2017 CLINICAL DATA:  Garbled speech and abnormal gait EXAM: CT HEAD WITHOUT CONTRAST TECHNIQUE: Contiguous axial images were obtained from the base of the skull through the vertex without intravenous contrast. COMPARISON:  CT brain 06/13/2017 FINDINGS: Brain: No hemorrhage is visualized. Increased hypodensity within the right greater than left parietal white matter consistent with edema. 9 mm suspected hypodense mass with central focus of hemorrhage or calcification suspicious for metastatic disease. Edema in the right occipital lobe. Moderate atrophy. Stable ventricle size. Increased hypodensity in the right thalamus compared to prior. Possible hypodense area of edema within the left cerebellar peduncle. Fourth ventricle is patent. Basilar cisterns patent. Vascular: No hyperdense vessels.  Carotid vascular calcification Skull: No fracture. Sinuses/Orbits: No acute finding. Other: None IMPRESSION: 1. Moderate hypodensity within the right greater than left parietal white matter with suspected 9 mm hypodense mass in the right white matter with punctate central focus of hemorrhage or calcification, concerning for metastatic disease. Edema also present in the right occipital lobe and is suspected  within the left cerebellum and right thalamus. Recommend MRI of the brain with and without contrast. 2. Atrophy and mild small vessel ischemic changes of the white matter. Electronically Signed   By: Donavan Foil M.D.   On: 07/18/2017 18:35   Mr Brain Wo Contrast  Result Date: 07/18/2017 CLINICAL DATA:  Speech difficulty EXAM: MRI HEAD WITHOUT CONTRAST TECHNIQUE: Multiplanar, multiecho pulse sequences of the brain and surrounding structures were obtained without intravenous contrast. COMPARISON:  None. FINDINGS: The examination had to be discontinued prior to completion due to patient request to terminate the study early. Axial and coronal diffusion-weighted imaging, corresponding ADC maps, sagittal T1-weighted imaging and axial T2-weighted imaging were acquired. No contrast was administered. Brain: There are numerous lesions throughout the brain that show peripheral diffusion restriction. There is no central diffusion restriction to suggest abscess. These are most consistent with metastases. The largest lesion is in the right parietal white matter measures 2.0 x 1.9 cm. There are at least 20 lesions. Edema is worst surrounding right parietal and left occipital lesions. No midline shift. No hydrocephalus. Vascular: Major intracranial arterial and venous sinus flow voids are preserved. Skull and upper cervical spine: Normal marrow signal. Sinuses/Orbits: Paranasal sinuses are clear. No mastoid or middle  ear effusion. Bilateral lens replacements. Other: None. IMPRESSION: 1. Truncated examination. No intravenous contrast material was administered. 2. Numerous (at least 20) metastases throughout the brain, most of which are centrally cystic or necrotic. 3. Moderate edema surrounding the right parietal and left occipital largest lesions. No associated midline shift or hydrocephalus. Electronically Signed   By: Ulyses Jarred M.D.   On: 07/18/2017 22:53    Labs:  CBC: Recent Labs    04/27/17 1447  06/13/17 1125 06/28/17 1137 07/18/17 1812 07/18/17 1837  WBC 11.2* 9.8 9.3  --  10.5  HGB 13.1 13.3  --  13.3 13.0  HCT 41.3 40.6 40.4 39.0 43.2  PLT 319.0 322.0 288  --  285    COAGS: Recent Labs    07/18/17 1837  INR 0.95  APTT 28    BMP: Recent Labs    04/27/17 1447 06/13/17 1125 06/28/17 1137 07/18/17 1812 07/18/17 1837  NA 145 143 146* 142 146*  K 4.4 3.7 4.5 3.8 4.0  CL 97 100 101 99* 99*  CO2 42* 38* 37*  --  34*  GLUCOSE 115* 113* 108 107* 112*  BUN 17 16 16 16 13   CALCIUM 9.5 9.4 10.2  --  9.2  CREATININE 0.99 0.66 0.73 0.80 0.90  GFRNONAA  --   --  >60  --  59*  GFRAA  --   --  >60  --  >60    LIVER FUNCTION TESTS: Recent Labs    04/27/17 1447 06/13/17 1125 06/28/17 1137 07/18/17 1837  BILITOT 0.3 0.3 0.3 0.5  AST 13 14 16 24   ALT 16 24 26 25   ALKPHOS 123* 115 123 94  PROT 6.8 6.8 6.6 6.4*  ALBUMIN 4.1 3.9 3.5 3.8    Assessment and Plan:  Pt with history of adenocarcinoma of the left lung diagnosed in 2010 with prior surgery/seed implants who recently presented to the hospital with complaints of confusion as well as difficulty speaking and progressive weakness of lower extremities with nausea and vomiting.  Subsequent imaging has revealed significant hypermetabolic metastatic disease to ipsilateral mediastinal lymph nodes, adrenal glands, pancreas, left perirenal space, left upper quadrant and omentum as well as muscular subcutaneous tissues, bone/brain.  Request now received from oncology for image guided biopsy of a right gluteus medius muscle soft tissue mass as well as potential Port-A-Cath placement at later date.  Patient did have  seizure activity this morning currently sedated and on IV Keppra.  She is undergoing whole brain irradiation.  Latest imaging studies have been reviewed by Dr. Annamaria Boots soft tissue mass in the right gluteal region appears amenable to percutaneous biopsy.  Details/risk of biopsy, including but not limited to, internal  bleeding, infection, discussed with patient's daughter with her understanding and consent.  Brief discussions were also held regarding details/risk of Port-A-Cath placement, including but not limited to, internal bleeding, infection, injury to adjacent structures.  We will plan to proceed with biopsy on 3/28.  Port-A-Cath placement/ timing will be determined at later date.  Lovenox will need to be held until after biopsy.   Electronically Signed: D. Rowe Robert, PA-C 07/20/2017, 1:57 PM   I spent a total of 20 minutes at the the patient's bedside AND on the patient's hospital floor or unit, greater than 50% of which was counseling/coordinating care for image guided biopsy of right gluteal soft tissue mass    Patient ID: Brandy Cameron, female   DOB: 10-05-1935, 82 y.o.   MRN: [id number]

## 2017-07-20 NOTE — Progress Notes (Signed)
Rapid Response Event Note  Overview:  called to bedside for change in patients mental status    Initial Focused Assessment: Upon arrival patient on side actively seizing. Seizure lasted approximately one minute followed by post-itictal phase. CBG 190, BP 161/88, HR 107, SpO2 97 on 3L o2. Pupils reactive but sluggish.  Interventions: ensured open airway, paged NP to bedside for further assessment and instruction  Plan of Care (if not transferred): ativan order prn for seizures by NP. Continue to monitor.    Brandy Cameron

## 2017-07-20 NOTE — Progress Notes (Signed)
[facility] Therapy Treatment Record Phone [phone]   Radiation Therapy was administered to [place] on: 07/20/2017  3:24 PM and was treatment # 1 out of a planned course of 10 treatments.  Radiation Treatment  1). Beam photons with 6-10 energy  2). Brachytherapy None  3). Stereotactic Radiosurgery None  4). Other Radiation None     Paulla Fore, RT (T)

## 2017-07-20 NOTE — Progress Notes (Signed)
Patient alert responding to my commands appropriately. States she does not know what happened explained to patient that she became aggressive and confused during the night and that had a seizure patient apologetic and  tearful. Restraints discontinued.

## 2017-07-21 ENCOUNTER — Inpatient Hospital Stay (HOSPITAL_COMMUNITY): Payer: Medicare Other

## 2017-07-21 ENCOUNTER — Ambulatory Visit
Admit: 2017-07-21 | Discharge: 2017-07-21 | Disposition: A | Discharge status: Home / Self Care | Payer: Medicare Other | Attending: Radiation Oncology | Admitting: Radiation Oncology

## 2017-07-21 DIAGNOSIS — C7931 Secondary malignant neoplasm of brain: Secondary | ICD-10-CM

## 2017-07-21 DIAGNOSIS — J439 Emphysema, unspecified: Secondary | ICD-10-CM

## 2017-07-21 LAB — COMPREHENSIVE METABOLIC PANEL
ALBUMIN: 3.4 g/dL — AB (ref 3.5–5.0)
ALK PHOS: 78 U/L (ref 38–126)
ALT: 26 U/L (ref 14–54)
AST: 23 U/L (ref 15–41)
Anion gap: 11 (ref 5–15)
BUN: 31 mg/dL — AB (ref 6–20)
CALCIUM: 9.3 mg/dL (ref 8.9–10.3)
CO2: 35 mmol/L — ABNORMAL HIGH (ref 22–32)
Chloride: 100 mmol/L — ABNORMAL LOW (ref 101–111)
Creatinine, Ser: 0.99 mg/dL (ref 0.44–1.00)
GFR calc Af Amer: >60 mL/min (ref >60)
GFR, EST NON AFRICAN AMERICAN: 52 mL/min — AB (ref >60)
GLUCOSE: 162 mg/dL — AB (ref 65–99)
POTASSIUM: 4.2 mmol/L (ref 3.5–5.1)
Sodium: 146 mmol/L — ABNORMAL HIGH (ref 135–145)
Total Bilirubin: 0.4 mg/dL (ref 0.3–1.2)
Total Protein: 6 g/dL — ABNORMAL LOW (ref 6.5–8.1)

## 2017-07-21 LAB — CBC
HCT: 41.2 % (ref 36.0–46.0)
Hemoglobin: 12.6 g/dL (ref 12.0–15.0)
MCH: 28.4 pg (ref 26.0–34.0)
MCHC: 30.6 g/dL (ref 30.0–36.0)
MCV: 93 fL (ref 78.0–100.0)
PLATELETS: 334 10*3/uL (ref 150–400)
RBC: 4.43 MIL/uL (ref 3.87–5.11)
RDW: 14.2 % (ref 11.5–15.5)
WBC: 15.5 10*3/uL — ABNORMAL HIGH (ref 4.0–10.5)

## 2017-07-21 LAB — AMMONIA: AMMONIA: 26 umol/L (ref 9–35)

## 2017-07-21 MED ORDER — FENTANYL CITRATE (PF) 100 MCG/2ML IJ SOLN
INTRAMUSCULAR | Status: AC | PRN
  Administered 2017-07-21: 25 ug via INTRAVENOUS

## 2017-07-21 MED ORDER — LIDOCAINE HCL (PF) 1 % IJ SOLN
INTRAMUSCULAR | Status: AC | PRN
  Administered 2017-07-21: 20 mL

## 2017-07-21 MED ORDER — LIDOCAINE HCL 1 % IJ SOLN
INTRAMUSCULAR | Status: AC
  Filled 2017-07-21: qty 20

## 2017-07-21 MED ORDER — SONAFINE EX EMUL
1.0000 "application " | Freq: Two times a day (BID) | CUTANEOUS | Status: DC
  Administered 2017-07-21: 1 via TOPICAL

## 2017-07-21 MED ORDER — FENTANYL CITRATE (PF) 100 MCG/2ML IJ SOLN
INTRAMUSCULAR | Status: AC
  Filled 2017-07-21: qty 2

## 2017-07-21 MED ORDER — MIDAZOLAM HCL 2 MG/2ML IJ SOLN
INTRAMUSCULAR | Status: AC
  Filled 2017-07-21: qty 2

## 2017-07-21 MED ORDER — MIDAZOLAM HCL 2 MG/2ML IJ SOLN
INTRAMUSCULAR | Status: AC | PRN
  Administered 2017-07-21: 0.5 mg via INTRAVENOUS

## 2017-07-21 NOTE — Progress Notes (Signed)
Pt here for patient teaching.  Pt given Radiation and You booklet, skin care instructions and Sonafine.  Reviewed areas of pertinence such as fatigue, hair loss, nausea and vomiting, skin changes, headache and blurry vision . Pt able to give teach back of to pat skin and use unscented/gentle soap,apply Sonafine bid and avoid applying anything to skin within 4 hours of treatment. Pt demonstrated understanding, needs reinforcement, no evidence of learning, refused teaching and of information given and will contact nursing with any questions or concerns.     [email or web address]

## 2017-07-21 NOTE — Progress Notes (Signed)
[facility] PROGRESS NOTE  Brandy Cameron TXM:[id number] DOB: 23-Feb-1936 DOA: 07/18/2017   PCP: [name], MD     Assessment/Plan: Principal Problem:   Brain metastasis [facility]) Active Problems:   CAD (coronary artery disease)   Hypertension   Primary cancer of left upper lobe of lung ([place])   Asthma   COPD (chronic obstructive pulmonary disease) ([place])   Aphasia   Metastatic cancer ([place])   82 y.o. female with history of recurrent non-small cell lung cancer being followed by oncologist who had recent PET scan done for progression of lung cancer started having some diaphoresis , daughter noted that patient was having some difficulty speaking. She was recently diagnosed with recurrent non-small cell lung cancer, adenocarcinoma with positive PDL 1 expression (60%) with left upper lobe as well as right upper lobe pulmonary nodules. She was initially diagnosed with a stage IA (T1a, N0, M0) in in October 2010.MRI of the brain concerning for metastatic lesions. Patient also developed new onset seizures this admission.  Assessment and plan  Metastatic brain lesions likely from patient's known history of non-small cell lung cancer -discussed with oncologist Dr.  Julien Nordmann who at this time advised to continue with the Decadron IV 6 mg q. 6 hourly.  - Patient presented with extensive and widely metastatic disease involving the lung, brain, mediastinal lymph nodes, adrenal glands, pancreas, perirenal space as well as left upper quadrant omentum in addition to muscular, bone and subcutaneous tissues.  - Oncology  recommended for the patient to consider whole brain irradiation for the multiple metastatic brain lesions, also recommend CT-guided/ultrasound-guided core biopsy of the right gluteus medius muscle for confirmation of her tissue diagnosis. -She underwent biopsy today. Based on the final pathology,  will discuss with the patient her treatment options including palliative care  versus consideration of palliative treatment with immunotherapy or a combination of chemotherapy and immunotherapy. Oncology also requested Port-A-Cath placed during her hospitalization. Patient was noted to have seizure activity yesterday and started on IV Keppra. - Port-A-Cath placement/ timing will be determined at later date.   Dr. Dorathy Daft start radiation treatment  Patient DNR.  New onset seizure  Started on keppra , prn ativan   Chronic diastolic CHF on Lasix which will be continued.  Hypertension on Cozaar and metoprolol.  COPD not actively wheezing continue inhalers.  Hyperlipidemia continue home medications.  DVT prophylaxsis  Lovenox  Code Status:  DNR    Family Communication: Discussed in detail with the patient , and her brother at the bedside. All imaging results, lab results explained to her.  Disposition Plan:  Requested radiation oncology and oncology to decide plan of care   Consultants:  Radiation oncology  Oncology  Procedures:  Ultrasound-guided biopsy of the right gluteal soft tissue mass.  Antibiotics: Anti-infectives (From admission, onward)   None     HPI/Subjective: Patient appears comfortable denies any headache, nausea, vomiting, blurry vision  Objective: Vitals:   07/21/17 1024 07/21/17 1043 07/21/17 1128 07/21/17 1254  BP: 114/66 134/66 133/74 (!) 133/56  Pulse: 78 82 84 84  Resp: 18 20 20 18   Temp: 97.8 F (36.6 C) 97.9 F (36.6 C) 97.7 F (36.5 C) 97.9 F (36.6 C)  TempSrc: Oral Axillary Oral Oral  SpO2: 98% 98% 98% 97%  Weight:      Height:        Intake/Output Summary (Last 24 hours) at 07/21/2017 1501 Last data filed at 07/21/2017 1400 Gross per 24 hour  Intake  0 ml  Output 1550 ml  Net -1550 ml    Exam:  Examination:  General exam: Appears calm and comfortable  Respiratory system: Occasional rhonchi otherwise clear to auscultation. Respiratory effort normal. Cardiovascular system: S1 & S2 heard, RRR. No  JVD, murmurs, rubs, gallops or clicks. No pedal edema. Gastrointestinal system: Abdomen is nondistended, soft and nontender. No organomegaly or masses felt. Normal bowel sounds heard. Central nervous system: Alert and oriented. No focal neurological deficits. Extremities: Symmetric 5 x 5 power. Skin: No rashes, lesions or ulcers Psychiatry: Judgement and insight appear normal. Mood & affect appropriate.    Data Reviewed: I have personally reviewed following labs and imaging studies  Micro Results Recent Results (from the past 240 hour(s))  Urine Culture     Status: Abnormal   Collection Time: 07/15/17 11:59 AM  Result Value Ref Range Status   MICRO NUMBER: [id number]  Final   SPECIMEN QUALITY: ADEQUATE  Final   Sample Source NOT GIVEN  Final   STATUS: FINAL  Final   ISOLATE 1: Escherichia coli (A)  Final    Comment: 10,000-50,000 CFU/mL of Escherichia coli      Susceptibility   Escherichia coli - URINE CULTURE, REFLEX    AMOX/CLAVULANIC >=32 Resistant     AMPICILLIN >=32 Resistant     AMPICILLIN/SULBACTAM 16 Intermediate     CEFAZOLIN* 8 Resistant      * For uncomplicated UTI caused by E. coli,K. pneumoniae or P. mirabilis: Cefazolin issusceptible if MIC <32 mcg/mL and predictssusceptible to the oral agents cefaclor, cefdinir,cefpodoxime, cefprozil, cefuroxime, cephalexinand loracarbef.    CEFEPIME <=1 Sensitive     CEFTRIAXONE <=1 Sensitive     CIPROFLOXACIN <=0.25 Sensitive     LEVOFLOXACIN <=0.12 Sensitive     ERTAPENEM <=0.5 Sensitive     GENTAMICIN <=1 Sensitive     IMIPENEM <=0.25 Sensitive     NITROFURANTOIN <=16 Sensitive     PIP/TAZO <=4 Sensitive     TOBRAMYCIN <=1 Sensitive     TRIMETH/SULFA* <=20 Sensitive      * For uncomplicated UTI caused by E. coli,K. pneumoniae or P. mirabilis: Cefazolin issusceptible if MIC <32 mcg/mL and predictssusceptible to the oral agents cefaclor, cefdinir,cefpodoxime, cefprozil, cefuroxime, cephalexinand loracarbef.Legend:S = Susceptible   I = IntermediateR = Resistant  NS = Not susceptible* = Not tested  NR = Not reported**NN = See antimicrobic comments    Radiology Reports Ct Head Wo Contrast  Result Date: 07/18/2017 CLINICAL DATA:  Garbled speech and abnormal gait EXAM: CT HEAD WITHOUT CONTRAST TECHNIQUE: Contiguous axial images were obtained from the base of the skull through the vertex without intravenous contrast. COMPARISON:  CT brain 06/13/2017 FINDINGS: Brain: No hemorrhage is visualized. Increased hypodensity within the right greater than left parietal white matter consistent with edema. 9 mm suspected hypodense mass with central focus of hemorrhage or calcification suspicious for metastatic disease. Edema in the right occipital lobe. Moderate atrophy. Stable ventricle size. Increased hypodensity in the right thalamus compared to prior. Possible hypodense area of edema within the left cerebellar peduncle. Fourth ventricle is patent. Basilar cisterns patent. Vascular: No hyperdense vessels.  Carotid vascular calcification Skull: No fracture. Sinuses/Orbits: No acute finding. Other: None IMPRESSION: 1. Moderate hypodensity within the right greater than left parietal white matter with suspected 9 mm hypodense mass in the right white matter with punctate central focus of hemorrhage or calcification, concerning for metastatic disease. Edema also present in the right occipital lobe and is suspected within the left cerebellum and right thalamus.  Recommend MRI of the brain with and without contrast. 2. Atrophy and mild small vessel ischemic changes of the white matter. Electronically Signed   By: Donavan Foil M.D.   On: 07/18/2017 18:35   Ct Chest Wo Contrast  Result Date: 06/29/2017 CLINICAL DATA:  Lung cancer. Chest pain, cough and shortness of breath. EXAM: CT CHEST WITHOUT CONTRAST TECHNIQUE: Multidetector CT imaging of the chest was performed following the standard protocol without IV contrast. COMPARISON:  08/13/2016. FINDINGS:  Cardiovascular: Atherosclerotic calcification of the arterial vasculature, including coronary arteries. Heart size normal. No pericardial effusion. Mediastinum/Nodes: Mediastinal lymph nodes are not enlarged by CT size criteria. Hilar regions are difficult to evaluate without IV contrast. No axillary adenopathy. Esophagus is grossly unremarkable. Lungs/Pleura: 0.8 x 1.5 cm nodule in the apical segment right upper lobe is stable. Centrilobular emphysema. Masslike consolidation in the apical left upper lobe has enlarged, measuring approximately 2.9 x 6.0 cm, previously 1.6 x 4.8 cm, with adjacent nodularity and increasing volume loss. Brachytherapy seeds are seen in the anterior left upper lobe. 6 mm right lower lobe nodule (image 90), stable. Trace loculated left apical pleural fluid. Airway is unremarkable. Upper Abdomen: Visualized portions of the liver and right adrenal gland are unremarkable. Slight thickening of the lateral limb left adrenal gland appears new. Visualized portion of the right kidney is unremarkable. Low-attenuation lesion in the upper pole left kidney measures 2.1 cm, difficult to further characterize without post-contrast imaging. Visualized portions of the spleen and pancreas are unremarkable. Tiny hiatal hernia. Cholecystectomy. No upper abdominal adenopathy. Musculoskeletal: Degenerative changes in the spine. No worrisome lytic or sclerotic lesions. IMPRESSION: 1. Enlarging masslike consolidation in the left upper lobe and new thickening of the lateral limb left adrenal gland, findings highly worrisome for disease progression. 2. Right upper and right lower lobe nodules are stable. 3. Aortic atherosclerosis (ICD10-170.0). Coronary artery calcification. 4.  Emphysema (ICD10-J43.9). Electronically Signed   By: Lorin Picket M.D.   On: 06/29/2017 07:35   Mr Brain Wo Contrast  Result Date: 07/18/2017 CLINICAL DATA:  Speech difficulty EXAM: MRI HEAD WITHOUT CONTRAST TECHNIQUE: Multiplanar,  multiecho pulse sequences of the brain and surrounding structures were obtained without intravenous contrast. COMPARISON:  None. FINDINGS: The examination had to be discontinued prior to completion due to patient request to terminate the study early. Axial and coronal diffusion-weighted imaging, corresponding ADC maps, sagittal T1-weighted imaging and axial T2-weighted imaging were acquired. No contrast was administered. Brain: There are numerous lesions throughout the brain that show peripheral diffusion restriction. There is no central diffusion restriction to suggest abscess. These are most consistent with metastases. The largest lesion is in the right parietal white matter measures 2.0 x 1.9 cm. There are at least 20 lesions. Edema is worst surrounding right parietal and left occipital lesions. No midline shift. No hydrocephalus. Vascular: Major intracranial arterial and venous sinus flow voids are preserved. Skull and upper cervical spine: Normal marrow signal. Sinuses/Orbits: Paranasal sinuses are clear. No mastoid or middle ear effusion. Bilateral lens replacements. Other: None. IMPRESSION: 1. Truncated examination. No intravenous contrast material was administered. 2. Numerous (at least 20) metastases throughout the brain, most of which are centrally cystic or necrotic. 3. Moderate edema surrounding the right parietal and left occipital largest lesions. No associated midline shift or hydrocephalus. Electronically Signed   By: Ulyses Jarred M.D.   On: 07/18/2017 22:53   Nm Pet Image Restag (ps) Skull Base To Thigh  Result Date: 07/13/2017 CLINICAL DATA:  Subsequent treatment strategy for left  upper lobe lung cancer. EXAM: NUCLEAR MEDICINE PET SKULL BASE TO THIGH TECHNIQUE: 10.7 mCi F-18 FDG was injected intravenously. Full-ring PET imaging was performed from the skull base to thigh after the radiotracer. CT data was obtained and used for attenuation correction and anatomic localization. Fasting blood  glucose: 130 mg/dl Mediastinal blood pool activity: SUV max 3.3 COMPARISON:  11/11/2015 PET-CT, and chest CT from 06/28/2017 FINDINGS: NECK: Activity in neck musculature and along the pharynx is thought to likely be physiologic. Incidental CT findings: none CHEST: Hypermetabolic left upper lobe suprahilar mass has notably enlarged compared to 11/11/2015, and is roughly stable to the exam from earlier this month, measuring approximately 4.5 by 2.9 cm with maximum SUV 28.8. Previously the maximum SUV in this vicinity was 11.8. A posterior left hilar lymph node measuring 10 mm in diameter has a maximum SUV of 11.4. An indistinct AP window lymph node measuring about 1.3 cm in short axis has a maximum SUV of 13.1. Postoperative and post therapy related findings are present in the left lung. There is a small amount of metabolic activity along the right apical pleuroparenchymal scarring, unchanged in size from 06/28/2017, maximum SUV 2.6 and previously 2.8 back on 11/11/2015. A chronically stable 6 mm right lower lobe nodule on image 41/8 is not appreciably hypermetabolic today. Incidental CT findings: Coronary, aortic arch, and branch vessel atherosclerotic vascular disease. ABDOMEN/PELVIS: Hypermetabolic 1.0 by 2.3 cm mass of the lateral limb left adrenal gland, maximum SUV 26.8. Hypermetabolic small nodule of the lateral limb right adrenal gland, maximum SUV 6.1. A hypermetabolic nodule within or along the body of the pancreas has a maximum SUV of 13.9 and measures 1.4 cm in diameter. An adjacent pancreatic body hypermetabolic focus is similarly sized and has a maximum SUV of 9.1. Small hypermetabolic left pelvic sidewall lymph nodes are present, measuring up to 8 mm in diameter and with maximum SUV 14.7 Physiologic uptake is present in bowel. There are several unusual intra-metastatic lesions including a small left upper quadrant omental focus with SUV 5.7 measuring up to 7 mm in diameter as well as 2 small lesions in  the left perirenal space. Incidental CT findings: Aortoiliac atherosclerotic vascular disease. Sigmoid colon diverticulosis. SKELETON: Multiple musculoskeletal metastatic lesions are present, including a suspected lesion in the left acromion with maximum SUV 30.7; a tiny nodule in the right lateral subcutaneous tissues of the thorax with maximum SUV 4.0; a lesion in the right gluteus medius muscle with maximum SUV 33.5; a lesion just posterior to the upper sacrum with maximum SUV 10.1; and small lesions along the left sciatic notch. Questionable lesion in the left iliac bone adjacent to the SI joint, maximum SUV 4.6. Incidental CT findings: none IMPRESSION: 1. Left upper lobe suprahilar hypermetabolic mass measuring about 4.5 by 2.9 cm, with associated metastatic disease to ipsilateral mediastinal lymph nodes; both adrenal glands; the pancreas; the left perirenal space; the left upper quadrant omentum; and several locations along muscular and subcutaneous tissues. Bony hypermetabolic lesions in the left acromion and perhaps the left iliac bone. 2. Other imaging findings of potential clinical significance: Aortic Atherosclerosis (ICD10-I70.0). Coronary atherosclerosis. Sigmoid colon diverticulosis. Electronically Signed   By: Van Clines M.D.   On: 07/13/2017 16:39   Dg Abd Acute W/chest  Result Date: 06/27/2017 CLINICAL DATA:  Lower abdominal pain, vomiting, constipation. History of lung cancer. EXAM: DG ABDOMEN ACUTE W/ 1V CHEST COMPARISON:  Chest radiographs dated 03/02/2017. Chest/abdominal radiographs dated 09/01/2016. FINDINGS: Stable nodular density at the right lung apex.  Stable postsurgical changes in the left upper lobe with brachytherapy seeds in the left upper hemithorax. Additional ill-defined opacity in the medial left upper hemithorax, better evaluated on prior CT. No focal consolidation.  No pleural effusion or pneumothorax. The heart is normal in size. Nonobstructive bowel gas pattern. No  evidence of free air under the diaphragm on the upright view. Cholecystectomy clips. Mild degenerative changes of the lower lumbar spine. IMPRESSION: Stable postsurgical and postprocedural changes in the left hemithorax. Stable nodular density in the right lung apex and ill-defined opacity in the medial left upper hemithorax. No evidence of small bowel obstruction or free air. Electronically Signed   By: Julian Hy M.D.   On: 06/27/2017 16:58     CBC Recent Labs  Lab 07/18/17 1812 07/18/17 1837 07/21/17 0403  WBC  --  10.5 15.5*  HGB 13.3 13.0 12.6  HCT 39.0 43.2 41.2  PLT  --  285 334  MCV  --  94.5 93.0  MCH  --  28.4 28.4  MCHC  --  30.1 30.6  RDW  --  14.3 14.2  LYMPHSABS  --  2.4  --   MONOABS  --  0.8  --   EOSABS  --  0.3  --   BASOSABS  --  0.1  --     Chemistries  Recent Labs  Lab 07/18/17 1812 07/18/17 1837 07/21/17 0403  NA 142 146* 146*  K 3.8 4.0 4.2  CL 99* 99* 100*  CO2  --  34* 35*  GLUCOSE 107* 112* 162*  BUN 16 13 31*  CREATININE 0.80 0.90 0.99  CALCIUM  --  9.2 9.3  AST  --  24 23  ALT  --  25 26  ALKPHOS  --  94 78  BILITOT  --  0.5 0.4   ------------------------------------------------------------------------------------------------------------------ estimated creatinine clearance is 47.7 mL/min (by C-G formula based on SCr of 0.99 mg/dL). ------------------------------------------------------------------------------------------------------------------ No results for input(s): HGBA1C in the last 72 hours. ------------------------------------------------------------------------------------------------------------------ No results for input(s): CHOL, HDL, LDLCALC, TRIG, CHOLHDL, LDLDIRECT in the last 72 hours. ------------------------------------------------------------------------------------------------------------------ No results for input(s): TSH, T4TOTAL, T3FREE, THYROIDAB in the last 72 hours.  Invalid input(s):  FREET3 ------------------------------------------------------------------------------------------------------------------ No results for input(s): VITAMINB12, FOLATE, FERRITIN, TIBC, IRON, RETICCTPCT in the last 72 hours.  Coagulation profile Recent Labs  Lab 07/18/17 1837  INR 0.95    No results for input(s): DDIMER in the last 72 hours.  Cardiac Enzymes No results for input(s): CKMB, TROPONINI, MYOGLOBIN in the last 168 hours.  Invalid input(s): CK ------------------------------------------------------------------------------------------------------------------ Invalid input(s): POCBNP   CBG: Recent Labs  Lab 07/18/17 2030 07/20/17 0520  GLUCAP 110* 190*       Studies: No results found.    Lab Results  Component Value Date   HGBA1C 6.0 04/27/2017   HGBA1C 5.8 10/07/2015   HGBA1C (H) 01/20/2010    6.5 (NOTE)                                                                       According to the ADA Clinical Practice Recommendations for 2011, when HbA1c is used as a screening test:   >=6.5%   Diagnostic of Diabetes Mellitus           (  if abnormal result  is confirmed)  5.7-6.4%   Increased risk of developing Diabetes Mellitus  References:Diagnosis and Classification of Diabetes Mellitus,Diabetes MNOT,7711,65(BXUXY 1):S62-S69 and Standards of Medical Care in         Diabetes - 2011,Diabetes BFXO,3291,91  (Suppl 1):S11-S61.   Lab Results  Component Value Date   LDLCALC 53 10/07/2015   CREATININE 0.99 07/21/2017       Scheduled Meds: . aspirin  81 mg Oral Daily  . dexamethasone  6 mg Intravenous Q6H  . donepezil  5 mg Oral QHS  . fentaNYL      . furosemide  80 mg Oral Daily  . lidocaine      . losartan  100 mg Oral Daily  . metoprolol tartrate  25 mg Oral BID  . midazolam      . mometasone-formoterol  2 puff Inhalation BID  . omega-3 acid ethyl esters  1 g Oral Daily  . potassium chloride SA  20 mEq Oral Daily   Continuous Infusions: . levETIRAcetam  Stopped (07/21/17 0842)     LOS: 2 days    Rhyen Mazariego  [facility] Pager [phone]. If 7PM-7AM, please contact night-coverage at [email or web address], password [facility] 07/21/2017, 3:01 PM  LOS: 2 days

## 2017-07-21 NOTE — Procedures (Signed)
Interventional Radiology Procedure Note  Procedure: US guided biopsy of right gluteal soft tissue mass  Complications: None  Estimated Blood Loss: None  Findings: Right gluteal soft tissue mass localized by [place], measuring 1.8 cm in greatest diameter. Four 18 G core biopsy samples obtained and submitted in formalin.  Venetia Night. Kathlene Cote, M.D Pager:  [phone]

## 2017-07-22 ENCOUNTER — Ambulatory Visit
Admit: 2017-07-22 | Discharge: 2017-07-22 | Disposition: A | Discharge status: Home / Self Care | Payer: Medicare Other | Attending: Radiation Oncology | Admitting: Radiation Oncology

## 2017-07-22 DIAGNOSIS — I2510 Atherosclerotic heart disease of native coronary artery without angina pectoris: Secondary | ICD-10-CM

## 2017-07-22 LAB — CBC
HEMATOCRIT: 41.5 % (ref 36.0–46.0)
HEMOGLOBIN: 12.8 g/dL (ref 12.0–15.0)
MCH: 28.5 pg (ref 26.0–34.0)
MCHC: 30.8 g/dL (ref 30.0–36.0)
MCV: 92.4 fL (ref 78.0–100.0)
Platelets: 318 10*3/uL (ref 150–400)
RBC: 4.49 MIL/uL (ref 3.87–5.11)
RDW: 14.1 % (ref 11.5–15.5)
WBC: 10.7 10*3/uL — AB (ref 4.0–10.5)

## 2017-07-22 LAB — BASIC METABOLIC PANEL
Anion gap: 11 (ref 5–15)
BUN: 34 mg/dL — AB (ref 6–20)
CHLORIDE: 100 mmol/L — AB (ref 101–111)
CO2: 34 mmol/L — AB (ref 22–32)
Calcium: 9.1 mg/dL (ref 8.9–10.3)
Creatinine, Ser: 0.84 mg/dL (ref 0.44–1.00)
GFR calc Af Amer: >60 mL/min (ref >60)
GLUCOSE: 181 mg/dL — AB (ref 65–99)
POTASSIUM: 3.9 mmol/L (ref 3.5–5.1)
Sodium: 145 mmol/L (ref 135–145)

## 2017-07-22 MED ORDER — ENOXAPARIN SODIUM 40 MG/0.4ML ~~LOC~~ SOLN
40.0000 mg | SUBCUTANEOUS | Status: DC
  Administered 2017-07-22: 40 mg via SUBCUTANEOUS
  Filled 2017-07-22: qty 0.4

## 2017-07-22 MED ORDER — ALUM & MAG HYDROXIDE-SIMETH 200-200-20 MG/5ML PO SUSP
15.0000 mL | Freq: Two times a day (BID) | ORAL | Status: DC | PRN
  Administered 2017-07-22 – 2017-07-23 (×2): 15 mL via ORAL
  Filled 2017-07-22 (×2): qty 30

## 2017-07-22 NOTE — Progress Notes (Signed)
[facility] PROGRESS NOTE  Brandy Cameron SFK:[id number] DOB: 1935-05-24 DOA: 07/18/2017   PCP: Brandy Lima, MD  Assessment/Plan: Principal Problem:   Brain metastasis [facility]) Active Problems:   CAD (coronary artery disease)   Hypertension   Primary cancer of left upper lobe of lung ([place])   Asthma   COPD (chronic obstructive pulmonary disease) ([place])   Aphasia   Metastatic cancer ([place])   82 y.o. female with history of recurrent non-small cell lung cancer being followed by oncologist who had recent PET scan done for progression of lung cancer started having some diaphoresis , daughter noted that patient was having some difficulty speaking. She was recently diagnosed with recurrent non-small cell lung cancer, adenocarcinoma with positive PDL 1 expression (60%) with left upper lobe as well as right upper lobe pulmonary nodules. She was initially diagnosed with a stage IA (T1a, N0, M0) in in October 2010.MRI of the brain concerning for metastatic lesions. Patient also developed new onset seizures this admission.  Assessment and plan  Metastatic brain lesions likely from patient's known history of non-small cell lung cancer -discussed with oncologist Dr.  Julien Cameron who at this time advised to continue with the Decadron IV 6 mg q. 6 hourly.  - Patient presented with extensive and widely metastatic disease involving the lung, brain, mediastinal lymph nodes, adrenal glands, pancreas, perirenal space as well as left upper quadrant omentum in addition to muscular, bone and subcutaneous tissues.  - Oncology  recommended for the patient to consider whole brain irradiation for the multiple metastatic brain lesions, also recommend CT-guided/ultrasound-guided core biopsy of the right gluteus medius muscle for confirmation of her tissue diagnosis. -She underwent biopsy 07/21/17. Based on the final pathology,  will discuss with the patient her treatment options including palliative care versus  consideration of palliative treatment with immunotherapy or a combination of chemotherapy and immunotherapy. Oncology also requested Port-A-Cath placed during her hospitalization. Patient was noted to have seizure activity previously and started on IV Keppra. - Port-A-Cath placement/ timing will be determined at later date.   Dr. Tammi Cameron to start radiation treatment  Patient DNR. 07/22/17: -She got radiation therapy today.  New onset seizure  Stable on keppra, prn ativan   Chronic diastolic CHF on Lasix which will be continued.  Hypertension on Cozaar and metoprolol.  COPD not actively wheezing continue inhalers.  Hyperlipidemia continue home medications.  DVT prophylaxsis  Lovenox was discontinued for the biopsy. 07/22/17: Will restart.  Code Status:  DNR    Family Communication: Discussed in detail with the patient , no family at the bedside. All imaging results, lab results explained to her.  Disposition Plan:  Unknown at this time  Consultants:  Radiation oncology  Oncology  Procedures:  Ultrasound-guided biopsy of the right gluteal soft tissue mass. (07/21/17)  Antibiotics: Anti-infectives (From admission, onward)   None     HPI/Subjective: Patient complaining of feeling tired. Denies any headache, nausea, vomiting, blurry vision  Objective: Vitals:   07/21/17 2035 07/22/17 0529 07/22/17 0822 07/22/17 1343  BP:  137/65  136/63  Pulse:  72  72  Resp:  18  20  Temp:  98.1 F (36.7 C)  (!) 97.5 F (36.4 C)  TempSrc:  Oral  Oral  SpO2: 97% 100% 99% 99%  Weight:      Height:        Intake/Output Summary (Last 24 hours) at 07/22/2017 1413 Last data filed at 07/22/2017 1109 Gross per 24 hour  Intake  200 ml  Output 500 ml  Net -300 ml    Exam:  Examination:  General exam: Appears calm and comfortable  Respiratory system: Occasional rhonchi otherwise clear to auscultation. Respiratory effort normal. Cardiovascular system: S1 & S2 heard, RRR. No JVD,  murmurs, rubs, gallops or clicks. No pedal edema. Gastrointestinal system: Abdomen is nondistended, soft and nontender. No organomegaly or masses felt. Normal bowel sounds heard. Central nervous system: Alert and oriented. Has debility. No focal neurological deficits. Extremities: Symmetric 5 x 5 power. Skin: No rashes, lesions or ulcers Psychiatry: Judgement and insight appear normal. Mood & affect appropriate.    Data Reviewed: I have personally reviewed following labs and imaging studies  Micro Results Recent Results (from the past 240 hour(s))  Urine Culture     Status: Abnormal   Collection Time: 07/15/17 11:59 AM  Result Value Ref Range Status   MICRO NUMBER: [id number]  Final   SPECIMEN QUALITY: ADEQUATE  Final   Sample Source NOT GIVEN  Final   STATUS: FINAL  Final   ISOLATE 1: Escherichia coli (A)  Final    Comment: 10,000-50,000 CFU/mL of Escherichia coli      Susceptibility   Escherichia coli - URINE CULTURE, REFLEX    AMOX/CLAVULANIC >=32 Resistant     AMPICILLIN >=32 Resistant     AMPICILLIN/SULBACTAM 16 Intermediate     CEFAZOLIN* 8 Resistant      * For uncomplicated UTI caused by E. coli,K. pneumoniae or P. mirabilis: Cefazolin issusceptible if MIC <32 mcg/mL and predictssusceptible to the oral agents cefaclor, cefdinir,cefpodoxime, cefprozil, cefuroxime, cephalexinand loracarbef.    CEFEPIME <=1 Sensitive     CEFTRIAXONE <=1 Sensitive     CIPROFLOXACIN <=0.25 Sensitive     LEVOFLOXACIN <=0.12 Sensitive     ERTAPENEM <=0.5 Sensitive     GENTAMICIN <=1 Sensitive     IMIPENEM <=0.25 Sensitive     NITROFURANTOIN <=16 Sensitive     PIP/TAZO <=4 Sensitive     TOBRAMYCIN <=1 Sensitive     TRIMETH/SULFA* <=20 Sensitive      * For uncomplicated UTI caused by E. coli,K. pneumoniae or P. mirabilis: Cefazolin issusceptible if MIC <32 mcg/mL and predictssusceptible to the oral agents cefaclor, cefdinir,cefpodoxime, cefprozil, cefuroxime, cephalexinand loracarbef.Legend:S =  Susceptible  I = IntermediateR = Resistant  NS = Not susceptible* = Not tested  NR = Not reported**NN = See antimicrobic comments    Radiology Reports Ct Head Wo Contrast  Result Date: 07/18/2017 CLINICAL DATA:  Garbled speech and abnormal gait EXAM: CT HEAD WITHOUT CONTRAST TECHNIQUE: Contiguous axial images were obtained from the base of the skull through the vertex without intravenous contrast. COMPARISON:  CT brain 06/13/2017 FINDINGS: Brain: No hemorrhage is visualized. Increased hypodensity within the right greater than left parietal white matter consistent with edema. 9 mm suspected hypodense mass with central focus of hemorrhage or calcification suspicious for metastatic disease. Edema in the right occipital lobe. Moderate atrophy. Stable ventricle size. Increased hypodensity in the right thalamus compared to prior. Possible hypodense area of edema within the left cerebellar peduncle. Fourth ventricle is patent. Basilar cisterns patent. Vascular: No hyperdense vessels.  Carotid vascular calcification Skull: No fracture. Sinuses/Orbits: No acute finding. Other: None IMPRESSION: 1. Moderate hypodensity within the right greater than left parietal white matter with suspected 9 mm hypodense mass in the right white matter with punctate central focus of hemorrhage or calcification, concerning for metastatic disease. Edema also present in the right occipital lobe and is suspected within the left cerebellum and  right thalamus. Recommend MRI of the brain with and without contrast. 2. Atrophy and mild small vessel ischemic changes of the white matter. Electronically Signed   By: Donavan Foil M.D.   On: 07/18/2017 18:35   Ct Chest Wo Contrast  Result Date: 06/29/2017 CLINICAL DATA:  Lung cancer. Chest pain, cough and shortness of breath. EXAM: CT CHEST WITHOUT CONTRAST TECHNIQUE: Multidetector CT imaging of the chest was performed following the standard protocol without IV contrast. COMPARISON:  08/13/2016.  FINDINGS: Cardiovascular: Atherosclerotic calcification of the arterial vasculature, including coronary arteries. Heart size normal. No pericardial effusion. Mediastinum/Nodes: Mediastinal lymph nodes are not enlarged by CT size criteria. Hilar regions are difficult to evaluate without IV contrast. No axillary adenopathy. Esophagus is grossly unremarkable. Lungs/Pleura: 0.8 x 1.5 cm nodule in the apical segment right upper lobe is stable. Centrilobular emphysema. Masslike consolidation in the apical left upper lobe has enlarged, measuring approximately 2.9 x 6.0 cm, previously 1.6 x 4.8 cm, with adjacent nodularity and increasing volume loss. Brachytherapy seeds are seen in the anterior left upper lobe. 6 mm right lower lobe nodule (image 90), stable. Trace loculated left apical pleural fluid. Airway is unremarkable. Upper Abdomen: Visualized portions of the liver and right adrenal gland are unremarkable. Slight thickening of the lateral limb left adrenal gland appears new. Visualized portion of the right kidney is unremarkable. Low-attenuation lesion in the upper pole left kidney measures 2.1 cm, difficult to further characterize without post-contrast imaging. Visualized portions of the spleen and pancreas are unremarkable. Tiny hiatal hernia. Cholecystectomy. No upper abdominal adenopathy. Musculoskeletal: Degenerative changes in the spine. No worrisome lytic or sclerotic lesions. IMPRESSION: 1. Enlarging masslike consolidation in the left upper lobe and new thickening of the lateral limb left adrenal gland, findings highly worrisome for disease progression. 2. Right upper and right lower lobe nodules are stable. 3. Aortic atherosclerosis (ICD10-170.0). Coronary artery calcification. 4.  Emphysema (ICD10-J43.9). Electronically Signed   By: Lorin Picket M.D.   On: 06/29/2017 07:35   Mr Brain Wo Contrast  Result Date: 07/18/2017 CLINICAL DATA:  Speech difficulty EXAM: MRI HEAD WITHOUT CONTRAST TECHNIQUE:  Multiplanar, multiecho pulse sequences of the brain and surrounding structures were obtained without intravenous contrast. COMPARISON:  None. FINDINGS: The examination had to be discontinued prior to completion due to patient request to terminate the study early. Axial and coronal diffusion-weighted imaging, corresponding ADC maps, sagittal T1-weighted imaging and axial T2-weighted imaging were acquired. No contrast was administered. Brain: There are numerous lesions throughout the brain that show peripheral diffusion restriction. There is no central diffusion restriction to suggest abscess. These are most consistent with metastases. The largest lesion is in the right parietal white matter measures 2.0 x 1.9 cm. There are at least 20 lesions. Edema is worst surrounding right parietal and left occipital lesions. No midline shift. No hydrocephalus. Vascular: Major intracranial arterial and venous sinus flow voids are preserved. Skull and upper cervical spine: Normal marrow signal. Sinuses/Orbits: Paranasal sinuses are clear. No mastoid or middle ear effusion. Bilateral lens replacements. Other: None. IMPRESSION: 1. Truncated examination. No intravenous contrast material was administered. 2. Numerous (at least 20) metastases throughout the brain, most of which are centrally cystic or necrotic. 3. Moderate edema surrounding the right parietal and left occipital largest lesions. No associated midline shift or hydrocephalus. Electronically Signed   By: Ulyses Jarred M.D.   On: 07/18/2017 22:53   Nm Pet Image Restag (ps) Skull Base To Thigh  Result Date: 07/13/2017 CLINICAL DATA:  Subsequent treatment strategy  for left upper lobe lung cancer. EXAM: NUCLEAR MEDICINE PET SKULL BASE TO THIGH TECHNIQUE: 10.7 mCi F-18 FDG was injected intravenously. Full-ring PET imaging was performed from the skull base to thigh after the radiotracer. CT data was obtained and used for attenuation correction and anatomic localization.  Fasting blood glucose: 130 mg/dl Mediastinal blood pool activity: SUV max 3.3 COMPARISON:  11/11/2015 PET-CT, and chest CT from 06/28/2017 FINDINGS: NECK: Activity in neck musculature and along the pharynx is thought to likely be physiologic. Incidental CT findings: none CHEST: Hypermetabolic left upper lobe suprahilar mass has notably enlarged compared to 11/11/2015, and is roughly stable to the exam from earlier this month, measuring approximately 4.5 by 2.9 cm with maximum SUV 28.8. Previously the maximum SUV in this vicinity was 11.8. A posterior left hilar lymph node measuring 10 mm in diameter has a maximum SUV of 11.4. An indistinct AP window lymph node measuring about 1.3 cm in short axis has a maximum SUV of 13.1. Postoperative and post therapy related findings are present in the left lung. There is a small amount of metabolic activity along the right apical pleuroparenchymal scarring, unchanged in size from 06/28/2017, maximum SUV 2.6 and previously 2.8 back on 11/11/2015. A chronically stable 6 mm right lower lobe nodule on image 41/8 is not appreciably hypermetabolic today. Incidental CT findings: Coronary, aortic arch, and branch vessel atherosclerotic vascular disease. ABDOMEN/PELVIS: Hypermetabolic 1.0 by 2.3 cm mass of the lateral limb left adrenal gland, maximum SUV 26.8. Hypermetabolic small nodule of the lateral limb right adrenal gland, maximum SUV 6.1. A hypermetabolic nodule within or along the body of the pancreas has a maximum SUV of 13.9 and measures 1.4 cm in diameter. An adjacent pancreatic body hypermetabolic focus is similarly sized and has a maximum SUV of 9.1. Small hypermetabolic left pelvic sidewall lymph nodes are present, measuring up to 8 mm in diameter and with maximum SUV 14.7 Physiologic uptake is present in bowel. There are several unusual intra-metastatic lesions including a small left upper quadrant omental focus with SUV 5.7 measuring up to 7 mm in diameter as well as 2  small lesions in the left perirenal space. Incidental CT findings: Aortoiliac atherosclerotic vascular disease. Sigmoid colon diverticulosis. SKELETON: Multiple musculoskeletal metastatic lesions are present, including a suspected lesion in the left acromion with maximum SUV 30.7; a tiny nodule in the right lateral subcutaneous tissues of the thorax with maximum SUV 4.0; a lesion in the right gluteus medius muscle with maximum SUV 33.5; a lesion just posterior to the upper sacrum with maximum SUV 10.1; and small lesions along the left sciatic notch. Questionable lesion in the left iliac bone adjacent to the SI joint, maximum SUV 4.6. Incidental CT findings: none IMPRESSION: 1. Left upper lobe suprahilar hypermetabolic mass measuring about 4.5 by 2.9 cm, with associated metastatic disease to ipsilateral mediastinal lymph nodes; both adrenal glands; the pancreas; the left perirenal space; the left upper quadrant omentum; and several locations along muscular and subcutaneous tissues. Bony hypermetabolic lesions in the left acromion and perhaps the left iliac bone. 2. Other imaging findings of potential clinical significance: Aortic Atherosclerosis (ICD10-I70.0). Coronary atherosclerosis. Sigmoid colon diverticulosis. Electronically Signed   By: Van Clines M.D.   On: 07/13/2017 16:39   Dg Abd Acute W/chest  Result Date: 06/27/2017 CLINICAL DATA:  Lower abdominal pain, vomiting, constipation. History of lung cancer. EXAM: DG ABDOMEN ACUTE W/ 1V CHEST COMPARISON:  Chest radiographs dated 03/02/2017. Chest/abdominal radiographs dated 09/01/2016. FINDINGS: Stable nodular density at the right  lung apex. Stable postsurgical changes in the left upper lobe with brachytherapy seeds in the left upper hemithorax. Additional ill-defined opacity in the medial left upper hemithorax, better evaluated on prior CT. No focal consolidation.  No pleural effusion or pneumothorax. The heart is normal in size. Nonobstructive bowel  gas pattern. No evidence of free air under the diaphragm on the upright view. Cholecystectomy clips. Mild degenerative changes of the lower lumbar spine. IMPRESSION: Stable postsurgical and postprocedural changes in the left hemithorax. Stable nodular density in the right lung apex and ill-defined opacity in the medial left upper hemithorax. No evidence of small bowel obstruction or free air. Electronically Signed   By: Julian Hy M.D.   On: 06/27/2017 16:58     CBC Recent Labs  Lab 07/18/17 1812 07/18/17 1837 07/21/17 0403 07/22/17 0356  WBC  --  10.5 15.5* 10.7*  HGB 13.3 13.0 12.6 12.8  HCT 39.0 43.2 41.2 41.5  PLT  --  285 334 318  MCV  --  94.5 93.0 92.4  MCH  --  28.4 28.4 28.5  MCHC  --  30.1 30.6 30.8  RDW  --  14.3 14.2 14.1  LYMPHSABS  --  2.4  --   --   MONOABS  --  0.8  --   --   EOSABS  --  0.3  --   --   BASOSABS  --  0.1  --   --     Chemistries  Recent Labs  Lab 07/18/17 1812 07/18/17 1837 07/21/17 0403 07/22/17 0356  NA 142 146* 146* 145  K 3.8 4.0 4.2 3.9  CL 99* 99* 100* 100*  CO2  --  34* 35* 34*  GLUCOSE 107* 112* 162* 181*  BUN 16 13 31* 34*  CREATININE 0.80 0.90 0.99 0.84  CALCIUM  --  9.2 9.3 9.1  AST  --  24 23  --   ALT  --  25 26  --   ALKPHOS  --  94 78  --   BILITOT  --  0.5 0.4  --    ------------------------------------------------------------------------------------------------------------------ estimated creatinine clearance is 56.2 mL/min (by C-G formula based on SCr of 0.84 mg/dL). ------------------------------------------------------------------------------------------------------------------ No results for input(s): HGBA1C in the last 72 hours. ------------------------------------------------------------------------------------------------------------------ No results for input(s): CHOL, HDL, LDLCALC, TRIG, CHOLHDL, LDLDIRECT in the last 72  hours. ------------------------------------------------------------------------------------------------------------------ No results for input(s): TSH, T4TOTAL, T3FREE, THYROIDAB in the last 72 hours.  Invalid input(s): FREET3 ------------------------------------------------------------------------------------------------------------------ No results for input(s): VITAMINB12, FOLATE, FERRITIN, TIBC, IRON, RETICCTPCT in the last 72 hours.  Coagulation profile Recent Labs  Lab 07/18/17 1837  INR 0.95    No results for input(s): DDIMER in the last 72 hours.  Cardiac Enzymes No results for input(s): CKMB, TROPONINI, MYOGLOBIN in the last 168 hours.  Invalid input(s): CK ------------------------------------------------------------------------------------------------------------------ Invalid input(s): POCBNP   CBG: Recent Labs  Lab 07/18/17 2030 07/20/17 0520  GLUCAP 110* 190*   Studies: No results found.    Lab Results  Component Value Date   HGBA1C 6.0 04/27/2017   HGBA1C 5.8 10/07/2015   HGBA1C (H) 01/20/2010    6.5 (NOTE)  According to the ADA Clinical Practice Recommendations for 2011, when HbA1c is used as a screening test:   >=6.5%   Diagnostic of Diabetes Mellitus           (if abnormal result  is confirmed)  5.7-6.4%   Increased risk of developing Diabetes Mellitus  References:Diagnosis and Classification of Diabetes Mellitus,Diabetes LGXQ,1194,17(EYCXK 1):S62-S69 and Standards of Medical Care in         Diabetes - 2011,Diabetes GYJE,5631,49  (Suppl 1):S11-S61.   Lab Results  Component Value Date   LDLCALC 53 10/07/2015   CREATININE 0.84 07/22/2017    Scheduled Meds: . aspirin  81 mg Oral Daily  . dexamethasone  6 mg Intravenous Q6H  . donepezil  5 mg Oral QHS  . furosemide  80 mg Oral Daily  . losartan  100 mg Oral Daily  . metoprolol tartrate  25 mg Oral BID  . mometasone-formoterol   2 puff Inhalation BID  . omega-3 acid ethyl esters  1 g Oral Daily   Continuous Infusions: . levETIRAcetam Stopped (07/22/17 0906)     LOS: 3 days    [facility] Pager [phone]. If 7PM-7AM, please contact night-coverage at [email or web address], password [facility] 07/22/2017, 2:13 PM  LOS: 3 days

## 2017-07-22 NOTE — Progress Notes (Signed)
[facility] Therapy Treatment Record Phone [phone]   Radiation Therapy was administered to Brandy Cameron on: 07/22/2017  12:44 PM and was treatment # 3 out of a planned course of 10 treatments.  Radiation Treatment  1). Beam photons with 6-10 energy  2). Brachytherapy None  3). Stereotactic Radiosurgery None  4). Other Radiation None     Brandy Cameron J Mathayus Stanbery, RT

## 2017-07-22 NOTE — Care Management Important Message (Signed)
Important Message  Patient Details  Name: Brandy Cameron MRN: [id number] Date of Birth: [birth year]   Medicare Important Message Given:  Yes    Kerin Salen 07/22/2017, 10:32 AMImportant Message  Patient Details  Name: Brandy Cameron MRN: [id number] Date of Birth: Feb 20, 1936   Medicare Important Message Given:  Yes    Kerin Salen 07/22/2017, 10:32 AM

## 2017-07-23 ENCOUNTER — Other Ambulatory Visit: Payer: Medicare Other

## 2017-07-23 DIAGNOSIS — C3412 Malignant neoplasm of upper lobe, left bronchus or lung: Secondary | ICD-10-CM

## 2017-07-23 DIAGNOSIS — C3491 Malignant neoplasm of unspecified part of right bronchus or lung: Secondary | ICD-10-CM

## 2017-07-23 DIAGNOSIS — I10 Essential (primary) hypertension: Secondary | ICD-10-CM

## 2017-07-23 DIAGNOSIS — C7931 Secondary malignant neoplasm of brain: Principal | ICD-10-CM

## 2017-07-23 LAB — CBC
HEMATOCRIT: 42 % (ref 36.0–46.0)
HEMOGLOBIN: 13 g/dL (ref 12.0–15.0)
MCH: 28.4 pg (ref 26.0–34.0)
MCHC: 31 g/dL (ref 30.0–36.0)
MCV: 91.7 fL (ref 78.0–100.0)
Platelets: 306 10*3/uL (ref 150–400)
RBC: 4.58 MIL/uL (ref 3.87–5.11)
RDW: 14 % (ref 11.5–15.5)
WBC: 10.2 10*3/uL (ref 4.0–10.5)

## 2017-07-23 LAB — BASIC METABOLIC PANEL
ANION GAP: 10 (ref 5–15)
BUN: 37 mg/dL — ABNORMAL HIGH (ref 6–20)
CALCIUM: 9.2 mg/dL (ref 8.9–10.3)
CO2: 36 mmol/L — AB (ref 22–32)
Chloride: 97 mmol/L — ABNORMAL LOW (ref 101–111)
Creatinine, Ser: 0.78 mg/dL (ref 0.44–1.00)
GFR calc Af Amer: >60 mL/min (ref >60)
GFR calc non Af Amer: >60 mL/min (ref >60)
Glucose, Bld: 160 mg/dL — ABNORMAL HIGH (ref 65–99)
Potassium: 3.9 mmol/L (ref 3.5–5.1)
SODIUM: 143 mmol/L (ref 135–145)

## 2017-07-23 MED ORDER — DEXAMETHASONE SODIUM PHOSPHATE 4 MG/ML IJ SOLN
6.0000 mg | Freq: Four times a day (QID) | INTRAMUSCULAR | Status: DC
  Administered 2017-07-23 (×2): 6 mg via INTRAVENOUS
  Filled 2017-07-23 (×3): qty 2

## 2017-07-23 MED ORDER — LEVETIRACETAM 500 MG PO TABS: 500.0000 mg | ORAL_TABLET | Freq: Two times a day (BID) | ORAL | 0 refills | Status: AC

## 2017-07-23 MED ORDER — DEXAMETHASONE 4 MG PO TABS: 4.0000 mg | ORAL_TABLET | Freq: Four times a day (QID) | ORAL | 0 refills | Status: DC

## 2017-07-23 NOTE — Discharge Summary (Signed)
Physician Discharge Summary  Brandy Cameron YSA:[id number] DOB: [birth year] DOA: 07/18/2017  PCP: [name], MD  Admit date: 07/18/2017 Discharge date: 07/23/2017  Admitted From: Home Disposition:  Home  Recommendations for Outpatient Follow-up:  1. Follow up with PCP in 2-3 weeks 2. Follow up with Oncology as scheduled  Discharge Condition:Stable CODE STATUS:DNR Diet recommendation: Regular   Brief/Interim Summary: 82 y.o.femalewithhistory of recurrent non-small cell lung cancer being followed by oncologist who had recent PET scan done for progression of lung cancer started having some diaphoresis , daughter noted that patient was having some difficulty speaking. She was recently diagnosed with recurrent non-small cell lung cancer, adenocarcinoma with positive PDL 1 expression (60%) with left upper lobe as well as right upper lobe pulmonary nodules. She was initially diagnosed with a stage IA (T1a, N0, M0) in in October 2010.MRI of the brain concerning for metastatic lesions. Patient also developed new onset seizures this admission.  Metastatic brain lesions likely from patient's known history of non-small cell lung cancer -Case was initially discussed with oncologist Dr.  Julien Nordmann who initially recommended - Patient presented with extensive and widely metastatic disease involving the lung, brain, mediastinal lymph nodes, adrenal glands, pancreas, perirenal space as well as left upper quadrant omentum in addition to muscular, bone and subcutaneous tissues.  - Oncology  recommended for the patient to consider whole brain irradiation for the multiple metastatic brain lesions, also recommend CT-guided/ultrasound-guided core biopsy of the right gluteus medius muscle for confirmation of her tissue diagnosis which was done on 07/21/17. -Would have patient follow up with Oncology for results of biopsy - Port-A-Cath placement/ timingwill be determinedat later date. -Patient has since  started radiation tx, to be continued as outpatient  New onset seizure  -remained seizure free on keppra -Will continue patient on PO decadron  Chronic diastolic CHF on Lasix which will be continued.  Hypertension on Cozaar and metoprolol.  COPD not actively wheezing continue inhalers.  Hyperlipidemia continue home medications.   Discharge Diagnoses:  Principal Problem:   Brain metastasis ([place]) Active Problems:   CAD (coronary artery disease)   Hypertension   Primary cancer of left upper lobe of lung (HCC)   Asthma   COPD (chronic obstructive pulmonary disease) (HCC)   Aphasia   Metastatic cancer (HCC)    Discharge Instructions   Allergies as of 07/23/2017      Reactions   Augmentin [amoxicillin-pot Clavulanate] Diarrhea   Sulfa Antibiotics Rash, Shortness Of Breath   Tape Other (See Comments)   Amlodipine Other (See Comments)   Unknown reaction   Atorvastatin Other (See Comments)   Leg pain/weakness   Pantoprazole Nausea And Vomiting   Sulfonamide Derivatives Rash      Medication List    STOP taking these medications   nitrofurantoin (macrocrystal-monohydrate) 100 MG capsule Commonly known as:  MACROBID   potassium chloride SA 20 MEQ tablet Commonly known as:  K-DUR,KLOR-CON     TAKE these medications   AEROCHAMBER MV inhaler Use as instructed   albuterol (2.5 MG/3ML) 0.083% nebulizer solution Commonly known as:  PROVENTIL Take 3 mLs (2.5 mg total) by nebulization every 4 (four) hours as needed for wheezing or shortness of breath.   aspirin EC 81 MG tablet Take 81 mg by mouth daily.   budesonide-formoterol 160-4.5 MCG/ACT inhaler Commonly known as:  SYMBICORT Inhale 2 puffs into the lungs 2 (two) times daily.   dexamethasone 4 MG tablet Commonly known as:  DECADRON Take 1 tablet (4 mg total) by mouth  4 (four) times daily.   donepezil 5 MG tablet Commonly known as:  ARICEPT Take 1/2 tablet daily for 2 weeks, then increase to 1 tablet  daily What changed:    how much to take  how to take this  when to take this  additional instructions   FISH OIL PO Take 1 capsule by mouth daily.   furosemide 40 MG tablet Commonly known as:  LASIX Take 2 tablets (80 mg total) by mouth daily. What changed:    how much to take  when to take this   levETIRAcetam 500 MG tablet Commonly known as:  KEPPRA Take 1 tablet (500 mg total) by mouth 2 (two) times daily.   losartan 100 MG tablet Commonly known as:  COZAAR Take 1 tablet (100 mg total) by mouth daily.   metoprolol tartrate 25 MG tablet Commonly known as:  LOPRESSOR Take 1 tablet (25 mg total) by mouth 2 (two) times daily.   nitroGLYCERIN 0.4 MG SL tablet Commonly known as:  NITROSTAT Place 0.4 mg under the tongue every 5 (five) minutes as needed for chest pain.   OXYGEN Inhale 2 L into the lungs continuous.      Follow-up Information    [name], MD. Schedule an appointment as soon as possible for a visit in 2 week(s).   Specialty:  Internal Medicine Contact information: [address] [place] [address] [phone]        Curt Bears, MD. Schedule an appointment as soon as possible for a visit.   Specialty:  Oncology Contact information: [address] [place] [address] [phone]          Allergies  Allergen Reactions  . Augmentin [Amoxicillin-Pot Clavulanate] Diarrhea  . Sulfa Antibiotics Rash and Shortness Of Breath  . Tape Other (See Comments)  . Amlodipine Other (See Comments)    Unknown reaction  . Atorvastatin Other (See Comments)    Leg pain/weakness  . Pantoprazole Nausea And Vomiting  . Sulfonamide Derivatives Rash    Consultations:  IR  Radiation Oncology  Procedures/Studies: Ct Head Wo Contrast  Result Date: 07/18/2017 CLINICAL DATA:  Garbled speech and abnormal gait EXAM: CT HEAD WITHOUT CONTRAST TECHNIQUE: Contiguous axial images were obtained from the base of the  skull through the vertex without intravenous contrast. COMPARISON:  CT brain 06/13/2017 FINDINGS: Brain: No hemorrhage is visualized. Increased hypodensity within the right greater than left parietal white matter consistent with edema. 9 mm suspected hypodense mass with central focus of hemorrhage or calcification suspicious for metastatic disease. Edema in the right occipital lobe. Moderate atrophy. Stable ventricle size. Increased hypodensity in the right thalamus compared to prior. Possible hypodense area of edema within the left cerebellar peduncle. Fourth ventricle is patent. Basilar cisterns patent. Vascular: No hyperdense vessels.  Carotid vascular calcification Skull: No fracture. Sinuses/Orbits: No acute finding. Other: None IMPRESSION: 1. Moderate hypodensity within the right greater than left parietal white matter with suspected 9 mm hypodense mass in the right white matter with punctate central focus of hemorrhage or calcification, concerning for metastatic disease. Edema also present in the right occipital lobe and is suspected within the left cerebellum and right thalamus. Recommend MRI of the brain with and without contrast. 2. Atrophy and mild small vessel ischemic changes of the white matter. Electronically Signed   By: Donavan Foil M.D.   On: 07/18/2017 18:35   Ct Chest Wo Contrast  Result Date: 06/29/2017 CLINICAL DATA:  Lung cancer. Chest pain, cough  and shortness of breath. EXAM: CT CHEST WITHOUT CONTRAST TECHNIQUE: Multidetector CT imaging of the chest was performed following the standard protocol without IV contrast. COMPARISON:  08/13/2016. FINDINGS: Cardiovascular: Atherosclerotic calcification of the arterial vasculature, including coronary arteries. Heart size normal. No pericardial effusion. Mediastinum/Nodes: Mediastinal lymph nodes are not enlarged by CT size criteria. Hilar regions are difficult to evaluate without IV contrast. No axillary adenopathy. Esophagus is grossly  unremarkable. Lungs/Pleura: 0.8 x 1.5 cm nodule in the apical segment right upper lobe is stable. Centrilobular emphysema. Masslike consolidation in the apical left upper lobe has enlarged, measuring approximately 2.9 x 6.0 cm, previously 1.6 x 4.8 cm, with adjacent nodularity and increasing volume loss. Brachytherapy seeds are seen in the anterior left upper lobe. 6 mm right lower lobe nodule (image 90), stable. Trace loculated left apical pleural fluid. Airway is unremarkable. Upper Abdomen: Visualized portions of the liver and right adrenal gland are unremarkable. Slight thickening of the lateral limb left adrenal gland appears new. Visualized portion of the right kidney is unremarkable. Low-attenuation lesion in the upper pole left kidney measures 2.1 cm, difficult to further characterize without post-contrast imaging. Visualized portions of the spleen and pancreas are unremarkable. Tiny hiatal hernia. Cholecystectomy. No upper abdominal adenopathy. Musculoskeletal: Degenerative changes in the spine. No worrisome lytic or sclerotic lesions. IMPRESSION: 1. Enlarging masslike consolidation in the left upper lobe and new thickening of the lateral limb left adrenal gland, findings highly worrisome for disease progression. 2. Right upper and right lower lobe nodules are stable. 3. Aortic atherosclerosis (ICD10-170.0). Coronary artery calcification. 4.  Emphysema (ICD10-J43.9). Electronically Signed   By: Lorin Picket M.D.   On: 06/29/2017 07:35   Mr Brain Wo Contrast  Result Date: 07/18/2017 CLINICAL DATA:  Speech difficulty EXAM: MRI HEAD WITHOUT CONTRAST TECHNIQUE: Multiplanar, multiecho pulse sequences of the brain and surrounding structures were obtained without intravenous contrast. COMPARISON:  None. FINDINGS: The examination had to be discontinued prior to completion due to patient request to terminate the study early. Axial and coronal diffusion-weighted imaging, corresponding ADC maps, sagittal  T1-weighted imaging and axial T2-weighted imaging were acquired. No contrast was administered. Brain: There are numerous lesions throughout the brain that show peripheral diffusion restriction. There is no central diffusion restriction to suggest abscess. These are most consistent with metastases. The largest lesion is in the right parietal white matter measures 2.0 x 1.9 cm. There are at least 20 lesions. Edema is worst surrounding right parietal and left occipital lesions. No midline shift. No hydrocephalus. Vascular: Major intracranial arterial and venous sinus flow voids are preserved. Skull and upper cervical spine: Normal marrow signal. Sinuses/Orbits: Paranasal sinuses are clear. No mastoid or middle ear effusion. Bilateral lens replacements. Other: None. IMPRESSION: 1. Truncated examination. No intravenous contrast material was administered. 2. Numerous (at least 20) metastases throughout the brain, most of which are centrally cystic or necrotic. 3. Moderate edema surrounding the right parietal and left occipital largest lesions. No associated midline shift or hydrocephalus. Electronically Signed   By: Ulyses Jarred M.D.   On: 07/18/2017 22:53   Nm Pet Image Restag (ps) Skull Base To Thigh  Result Date: 07/13/2017 CLINICAL DATA:  Subsequent treatment strategy for left upper lobe lung cancer. EXAM: NUCLEAR MEDICINE PET SKULL BASE TO THIGH TECHNIQUE: 10.7 mCi F-18 FDG was injected intravenously. Full-ring PET imaging was performed from the skull base to thigh after the radiotracer. CT data was obtained and used for attenuation correction and anatomic localization. Fasting blood glucose: 130 mg/dl Mediastinal blood  pool activity: SUV max 3.3 COMPARISON:  11/11/2015 PET-CT, and chest CT from 06/28/2017 FINDINGS: NECK: Activity in neck musculature and along the pharynx is thought to likely be physiologic. Incidental CT findings: none CHEST: Hypermetabolic left upper lobe suprahilar mass has notably enlarged  compared to 11/11/2015, and is roughly stable to the exam from earlier this month, measuring approximately 4.5 by 2.9 cm with maximum SUV 28.8. Previously the maximum SUV in this vicinity was 11.8. A posterior left hilar lymph node measuring 10 mm in diameter has a maximum SUV of 11.4. An indistinct AP window lymph node measuring about 1.3 cm in short axis has a maximum SUV of 13.1. Postoperative and post therapy related findings are present in the left lung. There is a small amount of metabolic activity along the right apical pleuroparenchymal scarring, unchanged in size from 06/28/2017, maximum SUV 2.6 and previously 2.8 back on 11/11/2015. A chronically stable 6 mm right lower lobe nodule on image 41/8 is not appreciably hypermetabolic today. Incidental CT findings: Coronary, aortic arch, and branch vessel atherosclerotic vascular disease. ABDOMEN/PELVIS: Hypermetabolic 1.0 by 2.3 cm mass of the lateral limb left adrenal gland, maximum SUV 26.8. Hypermetabolic small nodule of the lateral limb right adrenal gland, maximum SUV 6.1. A hypermetabolic nodule within or along the body of the pancreas has a maximum SUV of 13.9 and measures 1.4 cm in diameter. An adjacent pancreatic body hypermetabolic focus is similarly sized and has a maximum SUV of 9.1. Small hypermetabolic left pelvic sidewall lymph nodes are present, measuring up to 8 mm in diameter and with maximum SUV 14.7 Physiologic uptake is present in bowel. There are several unusual intra-metastatic lesions including a small left upper quadrant omental focus with SUV 5.7 measuring up to 7 mm in diameter as well as 2 small lesions in the left perirenal space. Incidental CT findings: Aortoiliac atherosclerotic vascular disease. Sigmoid colon diverticulosis. SKELETON: Multiple musculoskeletal metastatic lesions are present, including a suspected lesion in the left acromion with maximum SUV 30.7; a tiny nodule in the right lateral subcutaneous tissues of the  thorax with maximum SUV 4.0; a lesion in the right gluteus medius muscle with maximum SUV 33.5; a lesion just posterior to the upper sacrum with maximum SUV 10.1; and small lesions along the left sciatic notch. Questionable lesion in the left iliac bone adjacent to the SI joint, maximum SUV 4.6. Incidental CT findings: none IMPRESSION: 1. Left upper lobe suprahilar hypermetabolic mass measuring about 4.5 by 2.9 cm, with associated metastatic disease to ipsilateral mediastinal lymph nodes; both adrenal glands; the pancreas; the left perirenal space; the left upper quadrant omentum; and several locations along muscular and subcutaneous tissues. Bony hypermetabolic lesions in the left acromion and perhaps the left iliac bone. 2. Other imaging findings of potential clinical significance: Aortic Atherosclerosis (ICD10-I70.0). Coronary atherosclerosis. Sigmoid colon diverticulosis. Electronically Signed   By: Van Clines M.D.   On: 07/13/2017 16:39   Dg Abd Acute W/chest  Result Date: 06/27/2017 CLINICAL DATA:  Lower abdominal pain, vomiting, constipation. History of lung cancer. EXAM: DG ABDOMEN ACUTE W/ 1V CHEST COMPARISON:  Chest radiographs dated 03/02/2017. Chest/abdominal radiographs dated 09/01/2016. FINDINGS: Stable nodular density at the right lung apex. Stable postsurgical changes in the left upper lobe with brachytherapy seeds in the left upper hemithorax. Additional ill-defined opacity in the medial left upper hemithorax, better evaluated on prior CT. No focal consolidation.  No pleural effusion or pneumothorax. The heart is normal in size. Nonobstructive bowel gas pattern. No evidence of free  air under the diaphragm on the upright view. Cholecystectomy clips. Mild degenerative changes of the lower lumbar spine. IMPRESSION: Stable postsurgical and postprocedural changes in the left hemithorax. Stable nodular density in the right lung apex and ill-defined opacity in the medial left upper hemithorax.  No evidence of small bowel obstruction or free air. Electronically Signed   By: Julian Hy M.D.   On: 06/27/2017 16:58    Subjective: Eager to go home  Discharge Exam: Vitals:   07/23/17 0822 07/23/17 1054  BP:  (!) 144/71  Pulse:  81  Resp:    Temp:    SpO2: 100% 96%   Vitals:   07/22/17 2147 07/23/17 0522 07/23/17 0822 07/23/17 1054  BP:  138/61  (!) 144/71  Pulse:  76  81  Resp:  20    Temp:  97.6 F (36.4 C)    TempSrc:  Oral    SpO2: 98% 98% 100% 96%  Weight:      Height:        General: Pt is alert, awake, not in acute distress Cardiovascular: RRR, S1/S2 +, no rubs, no gallops Respiratory: CTA bilaterally, no wheezing, no rhonchi Abdominal: Soft, NT, ND, bowel sounds + Extremities: no edema, no cyanosis   The results of significant diagnostics from this hospitalization (including imaging, microbiology, ancillary and laboratory) are listed below for reference.     Microbiology: Recent Results (from the past 240 hour(s))  Urine Culture     Status: Abnormal   Collection Time: 07/15/17 11:59 AM  Result Value Ref Range Status   MICRO NUMBER: [id number]  Final   SPECIMEN QUALITY: ADEQUATE  Final   Sample Source NOT GIVEN  Final   STATUS: FINAL  Final   ISOLATE 1: Escherichia coli (A)  Final    Comment: 10,000-50,000 CFU/mL of Escherichia coli      Susceptibility   Escherichia coli - URINE CULTURE, REFLEX    AMOX/CLAVULANIC >=32 Resistant     AMPICILLIN >=32 Resistant     AMPICILLIN/SULBACTAM 16 Intermediate     CEFAZOLIN* 8 Resistant      * For uncomplicated UTI caused by E. coli,K. pneumoniae or P. mirabilis: Cefazolin issusceptible if MIC <32 mcg/mL and predictssusceptible to the oral agents cefaclor, cefdinir,cefpodoxime, cefprozil, cefuroxime, cephalexinand loracarbef.    CEFEPIME <=1 Sensitive     CEFTRIAXONE <=1 Sensitive     CIPROFLOXACIN <=0.25 Sensitive     LEVOFLOXACIN <=0.12 Sensitive     ERTAPENEM <=0.5 Sensitive     GENTAMICIN <=1  Sensitive     IMIPENEM <=0.25 Sensitive     NITROFURANTOIN <=16 Sensitive     PIP/TAZO <=4 Sensitive     TOBRAMYCIN <=1 Sensitive     TRIMETH/SULFA* <=20 Sensitive      * For uncomplicated UTI caused by E. coli,K. pneumoniae or P. mirabilis: Cefazolin issusceptible if MIC <32 mcg/mL and predictssusceptible to the oral agents cefaclor, cefdinir,cefpodoxime, cefprozil, cefuroxime, cephalexinand loracarbef.Legend:S = Susceptible  I = IntermediateR = Resistant  NS = Not susceptible* = Not tested  NR = Not reported**NN = See antimicrobic comments     Labs: BNP (last 3 results) No results for input(s): BNP in the last 8760 hours. Basic Metabolic Panel: Recent Labs  Lab 07/18/17 1812 07/18/17 1837 07/21/17 0403 07/22/17 0356 07/23/17 0417  NA 142 146* 146* 145 143  K 3.8 4.0 4.2 3.9 3.9  CL 99* 99* 100* 100* 97*  CO2  --  34* 35* 34* 36*  GLUCOSE 107* 112* 162* 181* 160*  BUN 16 13 31* 34* 37*  CREATININE 0.80 0.90 0.99 0.84 0.78  CALCIUM  --  9.2 9.3 9.1 9.2   Liver Function Tests: Recent Labs  Lab 07/18/17 1837 07/21/17 0403  AST 24 23  ALT 25 26  ALKPHOS 94 78  BILITOT 0.5 0.4  PROT 6.4* 6.0*  ALBUMIN 3.8 3.4*   No results for input(s): LIPASE, AMYLASE in the last 168 hours. Recent Labs  Lab 07/21/17 0403  AMMONIA 26   CBC: Recent Labs  Lab 07/18/17 1812 07/18/17 1837 07/21/17 0403 07/22/17 0356 07/23/17 0417  WBC  --  10.5 15.5* 10.7* 10.2  NEUTROABS  --  7.0  --   --   --   HGB 13.3 13.0 12.6 12.8 13.0  HCT 39.0 43.2 41.2 41.5 42.0  MCV  --  94.5 93.0 92.4 91.7  PLT  --  285 334 318 306   Cardiac Enzymes: No results for input(s): CKTOTAL, CKMB, CKMBINDEX, TROPONINI in the last 168 hours. BNP: Invalid input(s): POCBNP CBG: Recent Labs  Lab 07/18/17 2030 07/20/17 0520  GLUCAP 110* 190*   D-Dimer No results for input(s): DDIMER in the last 72 hours. Hgb A1c No results for input(s): HGBA1C in the last 72 hours. Lipid Profile No results for  input(s): CHOL, HDL, LDLCALC, TRIG, CHOLHDL, LDLDIRECT in the last 72 hours. Thyroid function studies No results for input(s): TSH, T4TOTAL, T3FREE, THYROIDAB in the last 72 hours.  Invalid input(s): FREET3 Anemia work up No results for input(s): VITAMINB12, FOLATE, FERRITIN, TIBC, IRON, RETICCTPCT in the last 72 hours. Urinalysis    Component Value Date/Time   COLORURINE YELLOW 04/27/2017 [place] 04/27/2017 1447   LABSPEC 1.010 04/27/2017 1447   PHURINE 7.0 04/27/2017 1447   GLUCOSEU NEGATIVE 04/27/2017 1447   HGBUR SMALL (A) 04/27/2017 1447   BILIRUBINUR neg 07/15/2017 1135   KETONESUR NEGATIVE 04/27/2017 1447   PROTEINUR 1 07/15/2017 1135   PROTEINUR 100 (A) 02/06/2016 1323   UROBILINOGEN 0.2 07/15/2017 1135   UROBILINOGEN 0.2 04/27/2017 1447   NITRITE neg 07/15/2017 1135   NITRITE NEGATIVE 04/27/2017 1447   LEUKOCYTESUR Negative 07/15/2017 1135   Sepsis Labs Invalid input(s): PROCALCITONIN,  WBC,  LACTICIDVEN Microbiology Recent Results (from the past 240 hour(s))  Urine Culture     Status: Abnormal   Collection Time: 07/15/17 11:59 AM  Result Value Ref Range Status   MICRO NUMBER: [id number]  Final   SPECIMEN QUALITY: ADEQUATE  Final   Sample Source NOT GIVEN  Final   STATUS: FINAL  Final   ISOLATE 1: Escherichia coli (A)  Final    Comment: 10,000-50,000 CFU/mL of Escherichia coli      Susceptibility   Escherichia coli - URINE CULTURE, REFLEX    AMOX/CLAVULANIC >=32 Resistant     AMPICILLIN >=32 Resistant     AMPICILLIN/SULBACTAM 16 Intermediate     CEFAZOLIN* 8 Resistant      * For uncomplicated UTI caused by E. coli,K. pneumoniae or P. mirabilis: Cefazolin issusceptible if MIC <32 mcg/mL and predictssusceptible to the oral agents cefaclor, cefdinir,cefpodoxime, cefprozil, cefuroxime, cephalexinand loracarbef.    CEFEPIME <=1 Sensitive     CEFTRIAXONE <=1 Sensitive     CIPROFLOXACIN <=0.25 Sensitive     LEVOFLOXACIN <=0.12 Sensitive      ERTAPENEM <=0.5 Sensitive     GENTAMICIN <=1 Sensitive     IMIPENEM <=0.25 Sensitive     NITROFURANTOIN <=16 Sensitive     PIP/TAZO <=4 Sensitive     TOBRAMYCIN <=1  Sensitive     TRIMETH/SULFA* <=20 Sensitive      * For uncomplicated UTI caused by E. coli,K. pneumoniae or P. mirabilis: Cefazolin issusceptible if MIC <32 mcg/mL and predictssusceptible to the oral agents cefaclor, cefdinir,cefpodoxime, cefprozil, cefuroxime, cephalexinand loracarbef.Legend:S = Susceptible  I = IntermediateR = Resistant  NS = Not susceptible* = Not tested  NR = Not reported**NN = See antimicrobic comments     SIGNED:   Marylu Lund, MD  [facility] 07/23/2017, 12:46 PM  If 7PM-7AM, please contact night-coverage [email or web address] Password [email or web address]

## 2017-07-23 NOTE — Plan of Care (Signed)
Discharge instructions reviewed with patient and daughter, questions answered, verbalized understanding.  Daughter expressed concern that she cannot bring patient to radiation treatments before 4, advised daughter that there is no one to speak to regarding this on the weekend, that she needs to call the cancer center first thing Monday morning and discuss appointment times with them.  Daughter verbalized understanding.    Patient and belongings transported to main entrance of hospital to be taken home by daughter.  Prescriptions faxed to [facility] at daughters request, hard copies given to daughter.

## 2017-07-25 ENCOUNTER — Ambulatory Visit
Admission: RE | Admit: 2017-07-25 | Discharge: 2017-07-25 | Disposition: A | Discharge status: Home / Self Care | Payer: Medicare Other | Source: Ambulatory Visit | Attending: Radiation Oncology | Admitting: Radiation Oncology

## 2017-07-25 ENCOUNTER — Telehealth: Payer: Self-pay | Admitting: *Deleted

## 2017-07-25 DIAGNOSIS — Z510 Encounter for antineoplastic radiation therapy: Secondary | ICD-10-CM | POA: Insufficient documentation

## 2017-07-25 DIAGNOSIS — C3491 Malignant neoplasm of unspecified part of right bronchus or lung: Secondary | ICD-10-CM | POA: Diagnosis not present

## 2017-07-25 DIAGNOSIS — C7931 Secondary malignant neoplasm of brain: Secondary | ICD-10-CM | POA: Diagnosis not present

## 2017-07-26 ENCOUNTER — Telehealth: Payer: Self-pay | Admitting: *Deleted

## 2017-07-26 ENCOUNTER — Ambulatory Visit
Admission: RE | Admit: 2017-07-26 | Discharge: 2017-07-26 | Disposition: A | Discharge status: Home / Self Care | Payer: Medicare Other | Source: Ambulatory Visit | Attending: Radiation Oncology | Admitting: Radiation Oncology

## 2017-07-26 ENCOUNTER — Telehealth: Payer: Self-pay | Admitting: Internal Medicine

## 2017-07-26 DIAGNOSIS — C3491 Malignant neoplasm of unspecified part of right bronchus or lung: Secondary | ICD-10-CM | POA: Diagnosis not present

## 2017-07-26 DIAGNOSIS — C7931 Secondary malignant neoplasm of brain: Secondary | ICD-10-CM | POA: Diagnosis not present

## 2017-07-26 DIAGNOSIS — Z510 Encounter for antineoplastic radiation therapy: Secondary | ICD-10-CM | POA: Diagnosis not present

## 2017-07-26 NOTE — Telephone Encounter (Signed)
Last schedule appointment we have is 3:30 PM. She may need to take few hours off work to come with her mother for that visit.

## 2017-07-26 NOTE — Telephone Encounter (Signed)
Pt confused -"she took all her morning and night pills at the same time today." Concerned about her turning on stove and she is by herself all day . I told the dtr to keep Alexxia's  night pill dispenser and give them to her at night and she may need someone to stay with her. I told her to speak to xrt today side effects of brain irradiation.  Appt with [facility]. I told dtr to keep appt 4/10. She will try to get here at 330 -( appt at 300).

## 2017-07-26 NOTE — Telephone Encounter (Signed)
Called again still no answer LMOM RTC to make hosp f/u appt w/Dr. Ronnald Ramp at her earliest convenience.Marland KitchenJohny Chess

## 2017-07-26 NOTE — Telephone Encounter (Signed)
Scheduled appt per 4/1 sch message - left message for patients daughter with appt date and time.

## 2017-07-26 NOTE — Telephone Encounter (Signed)
"  I work 6:00 am through 3:00 pm.  I am the only one to bring my mother to appointments.  Just asked for appointment at 4:00 pm or 4:30 pm, but someone just called me with a 3:00 pm appointment."

## 2017-07-27 ENCOUNTER — Other Ambulatory Visit: Payer: Self-pay | Admitting: Radiation Oncology

## 2017-07-27 ENCOUNTER — Other Ambulatory Visit: Payer: Self-pay

## 2017-07-27 ENCOUNTER — Ambulatory Visit
Admission: RE | Admit: 2017-07-27 | Discharge: 2017-07-27 | Disposition: A | Discharge status: Home / Self Care | Payer: Medicare Other | Source: Ambulatory Visit | Attending: Radiation Oncology | Admitting: Radiation Oncology

## 2017-07-27 DIAGNOSIS — C7931 Secondary malignant neoplasm of brain: Secondary | ICD-10-CM

## 2017-07-27 DIAGNOSIS — Z510 Encounter for antineoplastic radiation therapy: Secondary | ICD-10-CM | POA: Diagnosis not present

## 2017-07-27 DIAGNOSIS — C3491 Malignant neoplasm of unspecified part of right bronchus or lung: Secondary | ICD-10-CM | POA: Diagnosis not present

## 2017-07-27 DIAGNOSIS — C7949 Secondary malignant neoplasm of other parts of nervous system: Principal | ICD-10-CM

## 2017-07-27 NOTE — Patient Outreach (Signed)
[facility]) Care Management  07/27/2017  Brandy Cameron [birth year] [id number]     EMMI-General Discharge RED ON EMMI ALERT Day #1 Date: 07/26/17 Red Alert Reason: "Know who to call about changes in condition? No"   Outreach attempt # 1 to patient. Spoke with patient who was very [place] and requested call be completed with her caregiver. Spoke with caregiver who voices that patient is doing fairly well. He states that most of her issues are related to side effects of cancer treatments. RN CM reviewed and addressed red alert. Caregiver reports that they are aware and know how to contact oncologist for any cancer related issues. Patient has PCP to reach out to for any other issues or concerns. They have paperwork but were unaware that per discharge summary patient should follow up with PCP. They will make appt. No issues with transportation. He voices patient has all her meds. They are filling med planner for patient. He reports that he has an appt with someone later today and plans to discuss and mention possible home health aide and nurse support. Otherwise, they have no other issues or concerns at this time. Advised that they would get one more automated EMMI-GENERAL post discharge calls to assess how they are doing following recent hospitalization and will receive a call from a nurse if any of their responses were abnormal      Plan: RN CM will close case at this time.    Enzo Montgomery, RN,BSN,CCM [place] [facility] Telephonic Care Management Coordinator Direct Phone: [phone] Toll Free: [phone] Fax: [phone]

## 2017-07-27 NOTE — Progress Notes (Signed)
Late entry from 07/26/2017 at 1615. Called to treatment machine by therapist out of concern. Met with patient's daughter, Brandy Cameron, while patient received whole brain radiation therapy. Brandy Cameron reports the patient was discharged from the hospital without any in home care. She reports she is the only one available to care for her mother. She reports she has to work. Brandy Cameron works 0600 to 1500. Brandy Cameron reports that yesterday she got to her mother's house to take her to radiation therapy and discovered her mother had taken all of her medications at one time. The daughter reports she had the medications in separate containers with explicit but simple directions. She reports her mother told her she got confused and just took all the medications at one time. Brandy Cameron reports her mother is unsteady on her feet but she purchased her a walker. Brandy Cameron expressed her concern that the patient will turn on the stove, forget to turn it off and burn the house down. Also, Brandy Cameron reports her mother isn't sleeping. Explained that due to the swelling on her mother's brain and active treatment her Decadron dose of 4 mg qid per Ashlyn Bruning, PA-C should continue. Encouraged Brandy Cameron to create a medication administration schedule such that her mother takes her last Decadron in the early evening with dinner. Obtained Debra's contact information. Brandy Cameron is aware this RN will enter an urgent [facility] referral and social work order. Finally, advised Brandy Cameron to ensure the appointment with Dr. Julien Nordmann on April 10th was kept. Understanding of all reviewed verbalized.

## 2017-07-27 NOTE — Telephone Encounter (Signed)
Pt/daughter have not called back to make hosp f.u appt. Per chart pt is having treatment done at the cancer center closing encounter...Brandy Cameron

## 2017-07-28 ENCOUNTER — Ambulatory Visit
Admission: RE | Admit: 2017-07-28 | Discharge: 2017-07-28 | Disposition: A | Discharge status: Home / Self Care | Payer: Medicare Other | Source: Ambulatory Visit | Attending: Radiation Oncology | Admitting: Radiation Oncology

## 2017-07-28 DIAGNOSIS — C7931 Secondary malignant neoplasm of brain: Secondary | ICD-10-CM | POA: Diagnosis not present

## 2017-07-28 DIAGNOSIS — C3491 Malignant neoplasm of unspecified part of right bronchus or lung: Secondary | ICD-10-CM | POA: Diagnosis not present

## 2017-07-28 DIAGNOSIS — Z510 Encounter for antineoplastic radiation therapy: Secondary | ICD-10-CM | POA: Diagnosis not present

## 2017-07-29 ENCOUNTER — Encounter: Payer: Self-pay | Admitting: Urology

## 2017-07-29 ENCOUNTER — Telehealth: Payer: Self-pay | Admitting: Radiation Oncology

## 2017-07-29 ENCOUNTER — Ambulatory Visit
Admission: RE | Admit: 2017-07-29 | Discharge: 2017-07-29 | Disposition: A | Discharge status: Home / Self Care | Payer: Medicare Other | Source: Ambulatory Visit | Attending: Radiation Oncology | Admitting: Radiation Oncology

## 2017-07-29 DIAGNOSIS — C7931 Secondary malignant neoplasm of brain: Secondary | ICD-10-CM | POA: Diagnosis not present

## 2017-07-29 DIAGNOSIS — Z510 Encounter for antineoplastic radiation therapy: Secondary | ICD-10-CM | POA: Diagnosis not present

## 2017-07-29 DIAGNOSIS — C3491 Malignant neoplasm of unspecified part of right bronchus or lung: Secondary | ICD-10-CM | POA: Diagnosis not present

## 2017-07-29 NOTE — Progress Notes (Signed)
At the time of her undertreat visit today she reports inability to sleep and some increased irritability that she feels is secondary to her lack of sleep.  She has not had any further seizure activity but has noticed decreased auditory acuity as well as seeing "white spots" in the right eye when looking down only. Otherwise her vision has remained stable.  She continues with intermittent shooting pains across her forehead which are short lasting and resolve spontaneously. She continues with generalized weakness but denies any specific focal weakness or progressive weakness. Her main concern is the inability to sleep at night.  After discussion with Dr. Lisbeth Renshaw, it is felt appropriate at this time to begin tapering her steroid dose as she has remained on Decadron 4mg  po QID since discharge from the hospital. She has completed 8 or 10 planned radiation treatments and is tolerating treatment well. I have provided written instruction to begin taking the Decadron 4 mg p.o. 3 times daily starting today and continue through August 05, 2017.  On April 13, she will begin taking 4 mg p.o. twice daily for 1 week and then decrease to 4 mg once daily beginning April 20th, then decrease to 1/2 tablet (2mg ) once daily for one week and then discontinue.  She has a 1 month follow up visit scheduled 08/31/17.  She knows to let us know if she has any increased neurologic symptoms at any time during this taper and we will adjust the dose accordingly.   Nicholos Johns, PA-C

## 2017-07-29 NOTE — Telephone Encounter (Signed)
Learned from patient she hasn't heard from [facility]. Phoned Santiago Glad to inquire. She reports [facility] can't accept the patient because of her location, insurance and nursing needs. Santiago Glad suggested [facility]. Phoned [facility] at [phone]. Meg encouraged me to call their [place] office at [phone]. Learned from another nurse that Romie Jumper handled this situation yesterday. Will stop efforts and consult with Enid Derry on Monday

## 2017-08-01 ENCOUNTER — Other Ambulatory Visit: Payer: Self-pay

## 2017-08-01 ENCOUNTER — Ambulatory Visit
Admission: RE | Admit: 2017-08-01 | Discharge: 2017-08-01 | Disposition: A | Discharge status: Home / Self Care | Payer: Medicare Other | Source: Ambulatory Visit | Attending: Radiation Oncology | Admitting: Radiation Oncology

## 2017-08-01 DIAGNOSIS — C7931 Secondary malignant neoplasm of brain: Secondary | ICD-10-CM | POA: Diagnosis not present

## 2017-08-01 DIAGNOSIS — Z510 Encounter for antineoplastic radiation therapy: Secondary | ICD-10-CM | POA: Diagnosis not present

## 2017-08-01 DIAGNOSIS — C3491 Malignant neoplasm of unspecified part of right bronchus or lung: Secondary | ICD-10-CM | POA: Diagnosis not present

## 2017-08-01 NOTE — Patient Outreach (Signed)
[facility]) Care Management  08/01/2017  Brandy Cameron 1935-10-06 [id number]     EMMI- General Discharge RED ON EMMI ALERT Day # 4 Date: 07/29/17 Red Alert Reason: "Lost interest in things? Yes   Sad/hopeless/anxious/empty? Yes"    Outreach attempt # 1 to patient.Spoke with patient who was very difficult to understand on the phone. She was able to say clearly that she is weak and tired form her treatments. Reviewed and addressed red alert with patient. She just kept responding and saying "i'm okay." Unable to complete accurate assessment with patient. She states that she has treatment appt later today and just wanted to get some rest before going.       Plan: RN CM will case at this time as no further interventions needed.    Enzo Montgomery, RN,BSN,CCM [place] [facility] Telephonic Care Management Coordinator Direct Phone: [phone] Toll Free: [phone] Fax: [phone]

## 2017-08-02 ENCOUNTER — Encounter: Payer: Self-pay | Admitting: *Deleted

## 2017-08-02 ENCOUNTER — Encounter: Payer: Self-pay | Admitting: Radiation Oncology

## 2017-08-02 ENCOUNTER — Ambulatory Visit
Admission: RE | Admit: 2017-08-02 | Discharge: 2017-08-02 | Disposition: A | Discharge status: Home / Self Care | Payer: Medicare Other | Source: Ambulatory Visit | Attending: Radiation Oncology | Admitting: Radiation Oncology

## 2017-08-02 ENCOUNTER — Other Ambulatory Visit: Payer: Self-pay

## 2017-08-02 DIAGNOSIS — Z510 Encounter for antineoplastic radiation therapy: Secondary | ICD-10-CM | POA: Diagnosis not present

## 2017-08-02 DIAGNOSIS — C3491 Malignant neoplasm of unspecified part of right bronchus or lung: Secondary | ICD-10-CM | POA: Diagnosis not present

## 2017-08-02 DIAGNOSIS — C7931 Secondary malignant neoplasm of brain: Secondary | ICD-10-CM | POA: Diagnosis not present

## 2017-08-02 NOTE — Patient Outreach (Signed)
[facility]) Care Management  08/02/2017  Brandy Cameron 15-Jun-1935 [id number]   Care Coordination    Voicemail message received from Dyann Kief at [facility] requesting call back. Return call placed. SW inquiring if [facility] services can go out to the home and assist patient with some of her medical issues. Advised that RN CM had verified and confirmed that patient lives out of [facility] service area for home visits as she is a resident in [place]. Advised that [facility] staff could only make phone calls to patient and this would not be effective given the patient's condition. Patient was also not interested in phone calls as she voiced she had too many appts and was tired and weak after treatments to talk on the phone. Given patient's current status she does sound appropriate for [facility] services to provide hands on and in the home support. Staff will follow up on possible [facility] services.       Enzo Montgomery, RN,BSN,CCM [place] [facility] Telephonic Care Management Coordinator Direct Phone: [phone] Toll Free: [phone] Fax: [phone]

## 2017-08-02 NOTE — Progress Notes (Signed)
[place] Work  Clinical Social Work was referred by radiation oncology nursing for assessment of psychosocial needs.  Clinical Social Worker attempted to contact patient's daughter, Hilda Blades, by phone to offer support and assess for needs.  CSW left voicemail to return call.  CSW contacted [facility] to determine why patient did not qualify for services, was notified [facility] does not serve [employer].       Maryjean Morn, MSW, LCSW, OSW-C Clinical Social Worker [facility] [phone]

## 2017-08-02 NOTE — Progress Notes (Signed)
Placed a home health referral on 07/27/2017. I have contacted multiple agencies attempting to obtain assistance for this patient to include [facility], [facility], [facility], [facility] and [facility]. None of these agencies would accept the patient. Spoke with Adaeia of [facility] today at 1630 and she agreed to process the referral.

## 2017-08-03 ENCOUNTER — Encounter: Payer: Self-pay | Admitting: Oncology

## 2017-08-03 ENCOUNTER — Inpatient Hospital Stay: Payer: Medicare Other | Attending: Internal Medicine | Admitting: Oncology

## 2017-08-03 ENCOUNTER — Telehealth: Payer: Self-pay | Admitting: Oncology

## 2017-08-03 ENCOUNTER — Other Ambulatory Visit: Payer: Self-pay | Admitting: Oncology

## 2017-08-03 VITALS — BP 132/63 | HR 86 | Temp 97.5°F | Resp 17 | Ht 61.0 in | Wt 194.8 lb

## 2017-08-03 DIAGNOSIS — C7889 Secondary malignant neoplasm of other digestive organs: Secondary | ICD-10-CM | POA: Diagnosis not present

## 2017-08-03 DIAGNOSIS — Z7189 Other specified counseling: Secondary | ICD-10-CM

## 2017-08-03 DIAGNOSIS — B370 Candidal stomatitis: Secondary | ICD-10-CM | POA: Diagnosis not present

## 2017-08-03 DIAGNOSIS — C786 Secondary malignant neoplasm of retroperitoneum and peritoneum: Secondary | ICD-10-CM | POA: Diagnosis not present

## 2017-08-03 DIAGNOSIS — R531 Weakness: Secondary | ICD-10-CM | POA: Insufficient documentation

## 2017-08-03 DIAGNOSIS — C7989 Secondary malignant neoplasm of other specified sites: Secondary | ICD-10-CM | POA: Diagnosis not present

## 2017-08-03 DIAGNOSIS — C3412 Malignant neoplasm of upper lobe, left bronchus or lung: Secondary | ICD-10-CM | POA: Insufficient documentation

## 2017-08-03 DIAGNOSIS — C771 Secondary and unspecified malignant neoplasm of intrathoracic lymph nodes: Secondary | ICD-10-CM | POA: Diagnosis not present

## 2017-08-03 DIAGNOSIS — C799 Secondary malignant neoplasm of unspecified site: Secondary | ICD-10-CM

## 2017-08-03 DIAGNOSIS — C7972 Secondary malignant neoplasm of left adrenal gland: Secondary | ICD-10-CM | POA: Diagnosis not present

## 2017-08-03 DIAGNOSIS — R1013 Epigastric pain: Secondary | ICD-10-CM | POA: Insufficient documentation

## 2017-08-03 DIAGNOSIS — C7971 Secondary malignant neoplasm of right adrenal gland: Secondary | ICD-10-CM | POA: Diagnosis not present

## 2017-08-03 DIAGNOSIS — C7931 Secondary malignant neoplasm of brain: Secondary | ICD-10-CM | POA: Insufficient documentation

## 2017-08-03 DIAGNOSIS — Z515 Encounter for palliative care: Secondary | ICD-10-CM | POA: Insufficient documentation

## 2017-08-03 DIAGNOSIS — K30 Functional dyspepsia: Secondary | ICD-10-CM | POA: Insufficient documentation

## 2017-08-03 MED ORDER — OMEPRAZOLE 20 MG PO CPDR: 20.0000 mg | DELAYED_RELEASE_CAPSULE | Freq: Every day | ORAL | 1 refills | Status: DC

## 2017-08-03 MED ORDER — FLUCONAZOLE 100 MG PO TABS: 100.0000 mg | ORAL_TABLET | Freq: Every day | ORAL | 0 refills | Status: AC

## 2017-08-03 NOTE — Telephone Encounter (Signed)
Scheduled appt per 4/10 los - Gave patient AVS and calender per los.

## 2017-08-03 NOTE — Assessment & Plan Note (Signed)
This is a very pleasant 82 year old white female with recurrent non-small cell lung cancer that was initially diagnosed in 2010 as a stage Ia status post left upper lobe wedge resection with seed implants.  The patient had evidence for disease recurrence and she was treated with palliative radiotherapy.  She has been in observation and she missed a few follow-up visits.    She had a CT scan of the chest formed in March 2019 that showed concerning findings for disease progression with enlargement of the masslike consolidation in the left upper lobe as well as new thickening in the left adrenal gland suspicious for metastatic disease.  She had a PET scan which confirmed metastatic disease.  She was recently hospitalized with difficulty speaking and was found to have more than 20 brain metastases.  She completed whole brain radiation yesterday. The patient is here to discuss her recent biopsy findings and treatment options.   The patient was seen with Dr. Julien Nordmann.  Biopsy results were discussed with the patient and her family which show the same adenocarcinoma.  PDL 1 is 90%.  Molecular studies are pending.  The patient's diagnosis, prognosis and treatment options were discussed.  We discussed that her disease is not curable.  Treatment options were reviewed including referral to palliative care/hospice versus treatment with immunotherapy assisting of Keytruda, versus possible targeted therapy pending the results of the molecular studies.  The patient is interested in pursuing treatment.  We will plan to bring her back in approximately 2 weeks to further discuss her treatment options.  This will allow her time to recover from her radiation and to reduce the dose of her steroids.  For her thrush, a prescription for fluconazole 100 mg daily times 10 days was sent to her pharmacy.  For the acid in her stomach, a prescription for omeprazole 20 mg daily was sent to her pharmacy.  The patient was advised to call  immediately if she has any concerning symptoms in the interim.

## 2017-08-03 NOTE — Progress Notes (Signed)
[place] OFFICE PROGRESS NOTE  Brandy Cameron, [place] [facility] [address] [place] [address]  DIAGNOSIS:  1) Recurrent non-small cell lung cancer, adenocarcinoma with positive PDL 1 expression (60%) with left upper lobe as well as right upper lobe pulmonary nodules. She was initially diagnosed with a stage IA (T1a, N0, M0) in in October 2010.   Molecular studies USAA one): Genomic Alterations Identified? KRAS Q61L RBM10 [id number]* Additional Findings? Microsatellite status MS-Stable Tumor Mutation Burden Cannot Be Determined Additional Disease-relevant Genes with No Reportable Alterations Identified? EGFR ALK BRAF MET RET ERBB2 ROS1  2) the patient had evidence of disease progression on a CT scan in March 2019.  The patient was found to have more than 20 brain metastases and PET scan showed metastatic disease to the ipsilateral mediastinal lymph nodes, both adrenal glands, the pancreas, left peritoneal space, left upper quadrant omentum, and areas along the muscular and subcutaneous tissues.  PRIOR THERAPY: 1) status post wedge resection with seed implants.  2) whole brain radiation completed on 08/02/2017.  CURRENT THERAPY: Observation.  INTERVAL HISTORY: Brandy Cameron 82 y.o. female returns for a routine follow-up visit accompanied by her daughter and son-in-law.  Patient completed whole brain radiation yesterday.  She is still feeling weak.  She remains on dexamethasone 4 mg 3 times a day.  She is due to reduce this to twice a day this Friday and then down to daily the following Friday.  She reports still having some visual changes.  She reports pain in her mouth and on her tongue.  Denies fevers and chills.  Denies chest pain, cough, hemoptysis.  She has her baseline shortness of breath and wears home oxygen.  Patient reports burning in her stomach and takes Gaviscon which helps.  She has mild nausea without vomiting.  Denies diarrhea and  constipation.  She has a decreased appetite and has lost more weight.  The patient had a recent biopsy and is here to discuss the results and treatment options.  MEDICAL HISTORY: Past Medical History:  Diagnosis Date  . Asthma   . CAD (coronary artery disease)    a. LHC 9/11 (in setting of NSTEMI): LAD irregs, mLCx 50, pRCA 50, inf-apical AK;  b. Myoview 9/13: Normal stress nuclear study.  LV Ejection Fraction: 71%   . Carotid stenosis    a. Carotid US 9/13: bilat 0-39%;  b. Carotid US 7/15: no sig stenosis  . Chronic diastolic CHF (congestive heart failure) ([place])    a. Echo 7/15: Mild LVH, mild focal basal septal hypertrophy, EF 50-55%, no RWMA, Gr 1 DD  . COPD (chronic obstructive pulmonary disease) ([place])   . HTN (hypertension)   . Lung cancer [facility])    [name]  Marland Kitchen NSTEMI (non-ST elevated myocardial infarction) ([place])    with only mild to moderate CAD in September of 2011,There was concern for apival ballooning. >> ? Tako-Tsubo syndrome vs vasospasm    ALLERGIES:  is allergic to augmentin [amoxicillin-pot clavulanate]; sulfa antibiotics; tape; amlodipine; atorvastatin; pantoprazole; and sulfonamide derivatives.  MEDICATIONS:  Current Outpatient Medications  Medication Sig Dispense Refill  . albuterol (PROVENTIL) (2.5 MG/3ML) 0.083% nebulizer solution Take 3 mLs (2.5 mg total) by nebulization every 4 (four) hours as needed for wheezing or shortness of breath. 120 mL 3  . aluminum hydroxide-magnesium carbonate (GAVISCON) 95-358 MG/15ML SUSP Take 15 mLs by mouth as needed.    Marland Kitchen  aspirin EC 81 MG tablet Take 81 mg by mouth daily.    . budesonide-formoterol (SYMBICORT) 160-4.5 MCG/ACT inhaler Inhale 2 puffs into the lungs 2 (two) times daily. 1 Inhaler 6  . dexamethasone (DECADRON) 4 MG tablet Take 1 tablet (4 mg total) by mouth 4 (four) times daily. 120 tablet 0  . donepezil (ARICEPT) 5 MG tablet Take 1/2  tablet daily for 2 weeks, then increase to 1 tablet daily (Patient taking differently: Take 2.5-5 mg by mouth See admin instructions. Start date 07/07/17: Take 1/2 tablet (2.5 mg) daily at bedtime for 2 weeks, then increase to 1 tablet (5 mg) daily at bedtime) 30 tablet 11  . furosemide (LASIX) 40 MG tablet Take 2 tablets (80 mg total) by mouth daily. (Patient taking differently: Take 40 mg by mouth 2 (two) times daily. ) 180 tablet 1  . levETIRAcetam (KEPPRA) 500 MG tablet Take 1 tablet (500 mg total) by mouth 2 (two) times daily. 60 tablet 0  . losartan (COZAAR) 100 MG tablet Take 1 tablet (100 mg total) by mouth daily. 90 tablet 0  . metoprolol tartrate (LOPRESSOR) 25 MG tablet Take 1 tablet (25 mg total) by mouth 2 (two) times daily. 180 tablet 1  . nitroGLYCERIN (NITROSTAT) 0.4 MG SL tablet Place 0.4 mg under the tongue every 5 (five) minutes as needed for chest pain.     . Omega-3 Fatty Acids (FISH OIL PO) Take 1 capsule by mouth daily.     . OXYGEN Inhale 2 L into the lungs continuous.     Marland Kitchen Spacer/Aero-Holding Chambers (AEROCHAMBER MV) inhaler Use as instructed 1 each 0  . fluconazole (DIFLUCAN) 100 MG tablet Take 1 tablet (100 mg total) by mouth daily. 10 tablet 0  . omeprazole (PRILOSEC) 20 MG capsule Take 1 capsule (20 mg total) by mouth daily. 30 capsule 1   No current facility-administered medications for this visit.     SURGICAL HISTORY:  Past Surgical History:  Procedure Laterality Date  . CATARACT SURGERY    . FOOT SURGERY    . GALLBLADDER SURGERY    . KNEE SURGERY    . LUNG LOBECTOMY    . VESICOVAGINAL FISTULA CLOSURE W/ TAH      REVIEW OF SYSTEMS:   Review of Systems  Constitutional: Negative for chills, fever. Positive for decreased appetite, weight loss, and fatigue. HENT:   Negative for mouth sores, nosebleeds, and trouble swallowing.  Reports a sore tongue and mouth. Eyes: Negative for eye problems and icterus.  Respiratory: Negative for cough, hemoptysis, and  wheezing.  She has her baseline shortness of breath and wears home oxygen.  Cardiovascular: Negative for chest pain and leg swelling.  Gastrointestinal: Negative for abdominal pain, constipation, diarrhea, nausea and vomiting. Reports acid in her stomach. Genitourinary: Negative for bladder incontinence, difficulty urinating, dysuria, frequency and hematuria.   Musculoskeletal: Negative for back pain, neck pain and neck stiffness.  Skin: Negative for itching and rash.  Neurological: Negative for dizziness, extremity weakness, headaches, light-headedness and seizures.  Hematological: Negative for adenopathy. Does not bruise/bleed easily.  Psychiatric/Behavioral: Negative for confusion, depression and sleep disturbance. The patient is not nervous/anxious.     PHYSICAL EXAMINATION:  Blood pressure 132/63, pulse 86, temperature (!) 97.5 F (36.4 C), temperature source Oral, resp. rate 17, height '5\' 1"'$  (1.549 m), weight 194 lb 12.8 oz (88.4 kg), SpO2 96 %.  ECOG PERFORMANCE STATUS: 2 - Symptomatic, <50% confined to bed  Physical Exam  Constitutional: Oriented to person, place, and time  and well-developed, well-nourished, and in no distress. No distress.  HENT:  Head: Normocephalic and atraumatic.  Mouth/Throat: Oropharynx with thrush. Eyes: Conjunctivae are normal. Right eye exhibits no discharge. Left eye exhibits no discharge. No scleral icterus.  Neck: Normal range of motion. Neck supple.  Cardiovascular: Normal rate, regular rhythm, normal heart sounds and intact distal pulses.  Trace pedal edema.  Pulmonary/Chest: Effort normal and breath sounds normal. No respiratory distress. No wheezes. No rales.  Abdominal: Soft. Bowel sounds are normal. Exhibits no distension and no mass. There is no tenderness.  Musculoskeletal: Normal range of motion.   Lymphadenopathy:    No cervical adenopathy.  Neurological: Alert and oriented to person, place, and time. Exhibits normal muscle tone.  Coordination normal.  Skin: Skin is warm and dry. No rash noted. Not diaphoretic. No erythema. No pallor.  Psychiatric: Mood, memory and judgment normal.  Vitals reviewed.  LABORATORY DATA: Lab Results  Component Value Date   WBC 10.2 07/23/2017   HGB 13.0 07/23/2017   HCT 42.0 07/23/2017   MCV 91.7 07/23/2017   PLT 306 07/23/2017      Chemistry      Component Value Date/Time   NA 143 07/23/2017 0417   NA 147 (H) 08/13/2016 1154   K 3.9 07/23/2017 0417   K 4.5 08/13/2016 1154   CL 97 (L) 07/23/2017 0417   CL 101 12/15/2011 0920   CO2 36 (H) 07/23/2017 0417   CO2 37 (H) 08/13/2016 1154   BUN 37 (H) 07/23/2017 0417   BUN 17.7 08/13/2016 1154   CREATININE 0.78 07/23/2017 0417   CREATININE 0.73 06/28/2017 1137   CREATININE 0.69 11/24/2016 1456   CREATININE 0.7 08/13/2016 1154      Component Value Date/Time   CALCIUM 9.2 07/23/2017 0417   CALCIUM 9.4 08/13/2016 1154   ALKPHOS 78 07/21/2017 0403   ALKPHOS 122 08/13/2016 1154   AST 23 07/21/2017 0403   AST 16 06/28/2017 1137   AST 15 08/13/2016 1154   ALT 26 07/21/2017 0403   ALT 26 06/28/2017 1137   ALT 22 08/13/2016 1154   BILITOT 0.4 07/21/2017 0403   BILITOT 0.3 06/28/2017 1137   BILITOT 0.27 08/13/2016 1154       RADIOGRAPHIC STUDIES:  Ct Head Wo Contrast  Result Date: 07/18/2017 CLINICAL DATA:  Garbled speech and abnormal gait EXAM: CT HEAD WITHOUT CONTRAST TECHNIQUE: Contiguous axial images were obtained from the base of the skull through the vertex without intravenous contrast. COMPARISON:  CT brain 06/13/2017 FINDINGS: Brain: No hemorrhage is visualized. Increased hypodensity within the right greater than left parietal white matter consistent with edema. 9 mm suspected hypodense mass with central focus of hemorrhage or calcification suspicious for metastatic disease. Edema in the right occipital lobe. Moderate atrophy. Stable ventricle size. Increased hypodensity in the right thalamus compared to prior.  Possible hypodense area of edema within the left cerebellar peduncle. Fourth ventricle is patent. Basilar cisterns patent. Vascular: No hyperdense vessels.  Carotid vascular calcification Skull: No fracture. Sinuses/Orbits: No acute finding. Other: None IMPRESSION: 1. Moderate hypodensity within the right greater than left parietal white matter with suspected 9 mm hypodense mass in the right white matter with punctate central focus of hemorrhage or calcification, concerning for metastatic disease. Edema also present in the right occipital lobe and is suspected within the left cerebellum and right thalamus. Recommend MRI of the brain with and without contrast. 2. Atrophy and mild small vessel ischemic changes of the white matter. Electronically Signed   By:  Donavan Foil M.D.   On: 07/18/2017 18:35   Mr Brain Wo Contrast  Result Date: 07/18/2017 CLINICAL DATA:  Speech difficulty EXAM: MRI HEAD WITHOUT CONTRAST TECHNIQUE: Multiplanar, multiecho pulse sequences of the brain and surrounding structures were obtained without intravenous contrast. COMPARISON:  None. FINDINGS: The examination had to be discontinued prior to completion due to patient request to terminate the study early. Axial and coronal diffusion-weighted imaging, corresponding ADC maps, sagittal T1-weighted imaging and axial T2-weighted imaging were acquired. No contrast was administered. Brain: There are numerous lesions throughout the brain that show peripheral diffusion restriction. There is no central diffusion restriction to suggest abscess. These are most consistent with metastases. The largest lesion is in the right parietal white matter measures 2.0 x 1.9 cm. There are at least 20 lesions. Edema is worst surrounding right parietal and left occipital lesions. No midline shift. No hydrocephalus. Vascular: Major intracranial arterial and venous sinus flow voids are preserved. Skull and upper cervical spine: Normal marrow signal. Sinuses/Orbits:  Paranasal sinuses are clear. No mastoid or middle ear effusion. Bilateral lens replacements. Other: None. IMPRESSION: 1. Truncated examination. No intravenous contrast material was administered. 2. Numerous (at least 20) metastases throughout the brain, most of which are centrally cystic or necrotic. 3. Moderate edema surrounding the right parietal and left occipital largest lesions. No associated midline shift or hydrocephalus. Electronically Signed   By: Ulyses Jarred M.D.   On: 07/18/2017 22:53   Nm Pet Image Restag (ps) Skull Base To Thigh  Result Date: 07/13/2017 CLINICAL DATA:  Subsequent treatment strategy for left upper lobe lung cancer. EXAM: NUCLEAR MEDICINE PET SKULL BASE TO THIGH TECHNIQUE: 10.7 mCi F-18 FDG was injected intravenously. Full-ring PET imaging was performed from the skull base to thigh after the radiotracer. CT data was obtained and used for attenuation correction and anatomic localization. Fasting blood glucose: 130 mg/dl Mediastinal blood pool activity: SUV max 3.3 COMPARISON:  11/11/2015 PET-CT, and chest CT from 06/28/2017 FINDINGS: NECK: Activity in neck musculature and along the pharynx is thought to likely be physiologic. Incidental CT findings: none CHEST: Hypermetabolic left upper lobe suprahilar mass has notably enlarged compared to 11/11/2015, and is roughly stable to the exam from earlier this month, measuring approximately 4.5 by 2.9 cm with maximum SUV 28.8. Previously the maximum SUV in this vicinity was 11.8. A posterior left hilar lymph node measuring 10 mm in diameter has a maximum SUV of 11.4. An indistinct AP window lymph node measuring about 1.3 cm in short axis has a maximum SUV of 13.1. Postoperative and post therapy related findings are present in the left lung. There is a small amount of metabolic activity along the right apical pleuroparenchymal scarring, unchanged in size from 06/28/2017, maximum SUV 2.6 and previously 2.8 back on 11/11/2015. A chronically  stable 6 mm right lower lobe nodule on image 41/8 is not appreciably hypermetabolic today. Incidental CT findings: Coronary, aortic arch, and branch vessel atherosclerotic vascular disease. ABDOMEN/PELVIS: Hypermetabolic 1.0 by 2.3 cm mass of the lateral limb left adrenal gland, maximum SUV 26.8. Hypermetabolic small nodule of the lateral limb right adrenal gland, maximum SUV 6.1. A hypermetabolic nodule within or along the body of the pancreas has a maximum SUV of 13.9 and measures 1.4 cm in diameter. An adjacent pancreatic body hypermetabolic focus is similarly sized and has a maximum SUV of 9.1. Small hypermetabolic left pelvic sidewall lymph nodes are present, measuring up to 8 mm in diameter and with maximum SUV 14.7 Physiologic uptake is present  in bowel. There are several unusual intra-metastatic lesions including a small left upper quadrant omental focus with SUV 5.7 measuring up to 7 mm in diameter as well as 2 small lesions in the left perirenal space. Incidental CT findings: Aortoiliac atherosclerotic vascular disease. Sigmoid colon diverticulosis. SKELETON: Multiple musculoskeletal metastatic lesions are present, including a suspected lesion in the left acromion with maximum SUV 30.7; a tiny nodule in the right lateral subcutaneous tissues of the thorax with maximum SUV 4.0; a lesion in the right gluteus medius muscle with maximum SUV 33.5; a lesion just posterior to the upper sacrum with maximum SUV 10.1; and small lesions along the left sciatic notch. Questionable lesion in the left iliac bone adjacent to the SI joint, maximum SUV 4.6. Incidental CT findings: none IMPRESSION: 1. Left upper lobe suprahilar hypermetabolic mass measuring about 4.5 by 2.9 cm, with associated metastatic disease to ipsilateral mediastinal lymph nodes; both adrenal glands; the pancreas; the left perirenal space; the left upper quadrant omentum; and several locations along muscular and subcutaneous tissues. Bony hypermetabolic  lesions in the left acromion and perhaps the left iliac bone. 2. Other imaging findings of potential clinical significance: Aortic Atherosclerosis (ICD10-I70.0). Coronary atherosclerosis. Sigmoid colon diverticulosis. Electronically Signed   By: Van Clines M.D.   On: 07/13/2017 16:39   US Biopsy (abdominal Retropertioneal Mass)  Result Date: 08-11-17 CLINICAL DATA:  Left upper lobe lung carcinoma with widespread metastatic disease including a soft tissue metastasis to the right gluteal musculature and additional soft tissue metastasis posterior to the sacrum. She presents for tissue biopsy of the gluteal soft tissue mass. EXAM: ULTRASOUND GUIDED CORE BIOPSY OF RIGHT GLUTEAL SOFT TISSUE MASS MEDICATIONS: 0.5 mg IV Versed; 25 mcg IV Fentanyl Total Moderate Sedation Time: 24 minutes. The patient's level of consciousness and physiologic status were continuously monitored during the procedure by Radiology nursing. PROCEDURE: The procedure, risks, benefits, and alternatives were explained to the patient. Questions regarding the procedure were encouraged and answered. The patient understands and consents to the procedure. A time out was performed prior to initiating the procedure. The right gluteal region was prepped with chlorhexidine in a sterile fashion, and a sterile drape was applied covering the operative field. A sterile gown and sterile gloves were used for the procedure. Local anesthesia was provided with 1% Lidocaine. Initial ultrasound was performed of the right gluteal region from a posterior approach. The posterior sacral region was also imaged. Under ultrasound guidance, an 18 gauge core biopsy device was used in obtaining 4 separate core biopsy samples of the right gluteal soft tissue mass. Material was submitted in formalin. COMPLICATIONS: None. FINDINGS: The right gluteal soft tissue mass was well visualized by ultrasound and measured approximately 1.8 cm in maximal diameter. This appears as a  hypoechoic well-circumscribed mass by ultrasound. A similar hypoechoic mass was seen in the midline soft tissues just superficial to the lower sacrum and measured approximately 1.9 cm in transverse diameter but was thinner in transverse thickness and was decided to biopsy the gluteal soft tissue mass. Solid tissue was obtained from the gluteal soft tissue mass. There were no immediate complications. IMPRESSION: Ultrasound-guided core biopsy performed of a right gluteal soft tissue mass measuring approximately 1.8 cm in maximal diameter. Electronically Signed   By: Aletta Edouard M.D.   On: 08/11/17 11:23     ASSESSMENT/PLAN:  Metastatic cancer [facility]) This is a very pleasant 82 year old white female with recurrent non-small cell lung cancer that was initially diagnosed in 2010 as a  stage Ia status post left upper lobe wedge resection with seed implants.  The patient had evidence for disease recurrence and she was treated with palliative radiotherapy.  She has been in observation and she missed a few follow-up visits.    She had a CT scan of the chest formed in March 2019 that showed concerning findings for disease progression with enlargement of the masslike consolidation in the left upper lobe as well as new thickening in the left adrenal gland suspicious for metastatic disease.  She had a PET scan which confirmed metastatic disease.  She was recently hospitalized with difficulty speaking and was found to have more than 20 brain metastases.  She completed whole brain radiation yesterday. The patient is here to discuss her recent biopsy findings and treatment options.   The patient was seen with Dr. Julien Nordmann.  Biopsy results were discussed with the patient and her family which show the same adenocarcinoma.  PDL 1 is 90%.  Molecular studies are pending.  The patient's diagnosis, prognosis and treatment options were discussed.  We discussed that her disease is not curable.  Treatment options were reviewed  including referral to palliative care/hospice versus treatment with immunotherapy assisting of Keytruda, versus possible targeted therapy pending the results of the molecular studies.  The patient is interested in pursuing treatment.  We will plan to bring her back in approximately 2 weeks to further discuss her treatment options.  This will allow her time to recover from her radiation and to reduce the dose of her steroids.  For her thrush, a prescription for fluconazole 100 mg daily times 10 days was sent to her pharmacy.  For the acid in her stomach, a prescription for omeprazole 20 mg daily was sent to her pharmacy.  The patient was advised to call immediately if she has any concerning symptoms in the interim.    Orders Placed This Encounter  Procedures  . CBC with Differential (Cancer Center Only)    Standing Status:   Future    Standing Expiration Date:   08/04/2018  . CMP ([place] only)    Standing Status:   Future    Standing Expiration Date:   08/04/2018   Mikey Bussing, DNP, AGPCNP-BC, AOCNP 08/03/17   ADDENDUM: Hematology/Oncology Attending: I had a face-to-face encounter with the patient.  I recommended her care plan.  This is a very pleasant 82 years old white female with metastatic non-small cell lung cancer, adenocarcinoma with PDL 1 expression of 90%.  The molecular studies are pending.  The patient underwent several studies recently including a PET scan as well as MRI of the brain that showed multiple metastatic brain lesions.  She completed a course of whole brain irradiation under the care of Dr. Tammi Klippel.  She is currently on a taper dose of Decadron.  She is complaining of oral thrush as well as dyspepsia from her treatment with the steroids. The molecular studies are still pending. I recommended for the patient to continue the taper dose of Decadron for now. I would wait for the molecular studies to become available before making a decision regarding her  treatment.  If she has no actionable mutations, I may consider her for treatment with single agent Keytruda versus enrollment in the [employer] clinical trial with Keytruda versus Keytruda plus lenvatinib. For the oral thrush we will start the patient on Diflucan. For the dyspepsia she would be treated with Prilosec. We will arrange for the patient to come back for follow-up visit in 3  weeks for reevaluation and more detailed discussion of her treatment options based on the molecular studies. The patient was advised to call immediately if she has any concerning symptoms in the interval.  Disclaimer: This note was dictated with voice recognition software. Similar sounding words can inadvertently be transcribed and may be missed upon review. Eilleen Kempf, MD 08/03/17

## 2017-08-04 DIAGNOSIS — C3492 Malignant neoplasm of unspecified part of left bronchus or lung: Secondary | ICD-10-CM | POA: Diagnosis not present

## 2017-08-04 NOTE — Progress Notes (Signed)
  Radiation Oncology         [phone] ________________________________  Name: Brandy Cameron MRN: [id number]  Date: 08/02/2017  DOB: 08-Jan-1936  End of Treatment Note  Diagnosis:   82 y.o. female with at least 66 brain mets from lung cancer    Indication for treatment:  Palliative       Radiation treatment dates:   07/20/2017 - 08/02/2017  Site/dose:   Whole Brain / 30 Gy in 10 fractions  Beams/energy:   Isodose Plan / 6X Photon  Narrative: The patient tolerated radiation treatment relatively well.   She experienced mild fatigue, headaches, vision and auditory deficits, and balance issues during her course of treatment. She denied any skin changes or hair loss. She remained on Decadron 4mg , and instructions for beginning to taper have been provided.  Plan: The patient has completed radiation treatment. The patient will return to radiation oncology clinic for routine followup in one month. I advised her to call or return sooner if she has any questions or concerns related to her recovery or treatment. ________________________________  Sheral Apley. Tammi Klippel, M.D.  This document serves as a record of services personally performed by Tyler Pita, MD. It was created on his behalf by Rae Lips, a trained medical scribe. The creation of this record is based on the scribe's personal observations and the provider's statements to them. This document has been checked and approved by the attending provider.

## 2017-08-06 DIAGNOSIS — I5032 Chronic diastolic (congestive) heart failure: Secondary | ICD-10-CM | POA: Diagnosis not present

## 2017-08-06 DIAGNOSIS — I2510 Atherosclerotic heart disease of native coronary artery without angina pectoris: Secondary | ICD-10-CM | POA: Diagnosis not present

## 2017-08-06 DIAGNOSIS — I110 Hypertensive heart disease with heart failure: Secondary | ICD-10-CM | POA: Diagnosis not present

## 2017-08-06 DIAGNOSIS — Z7982 Long term (current) use of aspirin: Secondary | ICD-10-CM | POA: Diagnosis not present

## 2017-08-06 DIAGNOSIS — C7931 Secondary malignant neoplasm of brain: Secondary | ICD-10-CM | POA: Diagnosis not present

## 2017-08-06 DIAGNOSIS — Z8673 Personal history of transient ischemic attack (TIA), and cerebral infarction without residual deficits: Secondary | ICD-10-CM | POA: Diagnosis not present

## 2017-08-06 DIAGNOSIS — K219 Gastro-esophageal reflux disease without esophagitis: Secondary | ICD-10-CM | POA: Diagnosis not present

## 2017-08-06 DIAGNOSIS — Z7952 Long term (current) use of systemic steroids: Secondary | ICD-10-CM | POA: Diagnosis not present

## 2017-08-06 DIAGNOSIS — Z87891 Personal history of nicotine dependence: Secondary | ICD-10-CM | POA: Diagnosis not present

## 2017-08-06 DIAGNOSIS — Z9981 Dependence on supplemental oxygen: Secondary | ICD-10-CM | POA: Diagnosis not present

## 2017-08-06 DIAGNOSIS — C3412 Malignant neoplasm of upper lobe, left bronchus or lung: Secondary | ICD-10-CM | POA: Diagnosis not present

## 2017-08-06 DIAGNOSIS — Z9181 History of falling: Secondary | ICD-10-CM | POA: Diagnosis not present

## 2017-08-06 DIAGNOSIS — J449 Chronic obstructive pulmonary disease, unspecified: Secondary | ICD-10-CM | POA: Diagnosis not present

## 2017-08-06 DIAGNOSIS — I252 Old myocardial infarction: Secondary | ICD-10-CM | POA: Diagnosis not present

## 2017-08-06 DIAGNOSIS — Z7951 Long term (current) use of inhaled steroids: Secondary | ICD-10-CM | POA: Diagnosis not present

## 2017-08-08 ENCOUNTER — Telehealth: Payer: Self-pay | Admitting: Internal Medicine

## 2017-08-08 ENCOUNTER — Telehealth: Payer: Self-pay | Admitting: Medical Oncology

## 2017-08-08 DIAGNOSIS — C7931 Secondary malignant neoplasm of brain: Secondary | ICD-10-CM

## 2017-08-08 NOTE — Telephone Encounter (Signed)
Not eating , vomited yesterday and had ?watery stool with dark red blood on the wipe x 1".Dtr is going to get some imodium. Pt is able to walk with walker but won't pick up her feet- "crapped in her bed today".  I told her to contact [facility] to see when they are coming out. The other option is to take her to ED.

## 2017-08-08 NOTE — Telephone Encounter (Signed)
Copied from [place] [id number]. Topic: Quick Communication - See Telephone Encounter >> Aug 08, 2017 10:47 AM Arletha Grippe wrote: CRM for notification. See Telephone encounter for: 08/08/17.  Almyra Free from [employer] called -  Verbal orders  Skilled nursing for disease mgmt, med mgmt, safety  1 week 1  2 week 2 1 week 5  2 prn for change in status   Also home health aid  2 week 8   Cb is [phone]

## 2017-08-08 NOTE — Telephone Encounter (Signed)
Brandy Cameron called - she needs to have this order ok's asap. Pt is unable to get up and be mobile. Pt is not eating. She can not get up from using the bathroom.  Daughter is uncomfortable leaving her alone.   cb is [phone] debra

## 2017-08-09 ENCOUNTER — Other Ambulatory Visit: Payer: Self-pay

## 2017-08-09 ENCOUNTER — Telehealth: Payer: Self-pay | Admitting: Internal Medicine

## 2017-08-09 ENCOUNTER — Inpatient Hospital Stay (HOSPITAL_COMMUNITY)
Admission: EM | Admit: 2017-08-09 | Discharge: 2017-08-11 | DRG: 055 | Disposition: A | Discharge status: Hospice | Payer: Medicare Other | Attending: Internal Medicine | Admitting: Internal Medicine

## 2017-08-09 ENCOUNTER — Inpatient Hospital Stay (HOSPITAL_COMMUNITY): Payer: Medicare Other

## 2017-08-09 ENCOUNTER — Encounter (HOSPITAL_COMMUNITY): Payer: Self-pay

## 2017-08-09 DIAGNOSIS — R197 Diarrhea, unspecified: Secondary | ICD-10-CM | POA: Diagnosis present

## 2017-08-09 DIAGNOSIS — C7931 Secondary malignant neoplasm of brain: Secondary | ICD-10-CM | POA: Diagnosis not present

## 2017-08-09 DIAGNOSIS — I129 Hypertensive chronic kidney disease with stage 1 through stage 4 chronic kidney disease, or unspecified chronic kidney disease: Secondary | ICD-10-CM | POA: Diagnosis not present

## 2017-08-09 DIAGNOSIS — R627 Adult failure to thrive: Secondary | ICD-10-CM | POA: Diagnosis present

## 2017-08-09 DIAGNOSIS — Z515 Encounter for palliative care: Secondary | ICD-10-CM

## 2017-08-09 DIAGNOSIS — Z825 Family history of asthma and other chronic lower respiratory diseases: Secondary | ICD-10-CM

## 2017-08-09 DIAGNOSIS — E669 Obesity, unspecified: Secondary | ICD-10-CM | POA: Diagnosis present

## 2017-08-09 DIAGNOSIS — C3490 Malignant neoplasm of unspecified part of unspecified bronchus or lung: Secondary | ICD-10-CM | POA: Diagnosis present

## 2017-08-09 DIAGNOSIS — J449 Chronic obstructive pulmonary disease, unspecified: Secondary | ICD-10-CM | POA: Diagnosis not present

## 2017-08-09 DIAGNOSIS — K219 Gastro-esophageal reflux disease without esophagitis: Secondary | ICD-10-CM | POA: Diagnosis present

## 2017-08-09 DIAGNOSIS — C7971 Secondary malignant neoplasm of right adrenal gland: Secondary | ICD-10-CM | POA: Diagnosis not present

## 2017-08-09 DIAGNOSIS — C799 Secondary malignant neoplasm of unspecified site: Secondary | ICD-10-CM | POA: Diagnosis not present

## 2017-08-09 DIAGNOSIS — Z8673 Personal history of transient ischemic attack (TIA), and cerebral infarction without residual deficits: Secondary | ICD-10-CM

## 2017-08-09 DIAGNOSIS — N182 Chronic kidney disease, stage 2 (mild): Secondary | ICD-10-CM | POA: Diagnosis not present

## 2017-08-09 DIAGNOSIS — R569 Unspecified convulsions: Secondary | ICD-10-CM | POA: Diagnosis present

## 2017-08-09 DIAGNOSIS — E875 Hyperkalemia: Secondary | ICD-10-CM | POA: Diagnosis present

## 2017-08-09 DIAGNOSIS — R112 Nausea with vomiting, unspecified: Secondary | ICD-10-CM | POA: Diagnosis present

## 2017-08-09 DIAGNOSIS — Z9981 Dependence on supplemental oxygen: Secondary | ICD-10-CM | POA: Diagnosis not present

## 2017-08-09 DIAGNOSIS — T380X5A Adverse effect of glucocorticoids and synthetic analogues, initial encounter: Secondary | ICD-10-CM | POA: Diagnosis not present

## 2017-08-09 DIAGNOSIS — C7972 Secondary malignant neoplasm of left adrenal gland: Secondary | ICD-10-CM | POA: Diagnosis present

## 2017-08-09 DIAGNOSIS — R05 Cough: Secondary | ICD-10-CM

## 2017-08-09 DIAGNOSIS — C7989 Secondary malignant neoplasm of other specified sites: Secondary | ICD-10-CM | POA: Diagnosis not present

## 2017-08-09 DIAGNOSIS — Z8249 Family history of ischemic heart disease and other diseases of the circulatory system: Secondary | ICD-10-CM

## 2017-08-09 DIAGNOSIS — I252 Old myocardial infarction: Secondary | ICD-10-CM

## 2017-08-09 DIAGNOSIS — Z66 Do not resuscitate: Secondary | ICD-10-CM | POA: Diagnosis not present

## 2017-08-09 DIAGNOSIS — Z7982 Long term (current) use of aspirin: Secondary | ICD-10-CM

## 2017-08-09 DIAGNOSIS — Z809 Family history of malignant neoplasm, unspecified: Secondary | ICD-10-CM

## 2017-08-09 DIAGNOSIS — Z87891 Personal history of nicotine dependence: Secondary | ICD-10-CM

## 2017-08-09 DIAGNOSIS — C3412 Malignant neoplasm of upper lobe, left bronchus or lung: Secondary | ICD-10-CM | POA: Diagnosis not present

## 2017-08-09 DIAGNOSIS — B370 Candidal stomatitis: Secondary | ICD-10-CM | POA: Diagnosis not present

## 2017-08-09 DIAGNOSIS — Z6834 Body mass index (BMI) 34.0-34.9, adult: Secondary | ICD-10-CM

## 2017-08-09 DIAGNOSIS — I10 Essential (primary) hypertension: Secondary | ICD-10-CM | POA: Diagnosis present

## 2017-08-09 DIAGNOSIS — Z7189 Other specified counseling: Secondary | ICD-10-CM | POA: Diagnosis not present

## 2017-08-09 DIAGNOSIS — Z7952 Long term (current) use of systemic steroids: Secondary | ICD-10-CM

## 2017-08-09 DIAGNOSIS — N179 Acute kidney failure, unspecified: Secondary | ICD-10-CM | POA: Insufficient documentation

## 2017-08-09 DIAGNOSIS — R0602 Shortness of breath: Secondary | ICD-10-CM | POA: Diagnosis not present

## 2017-08-09 DIAGNOSIS — R059 Cough, unspecified: Secondary | ICD-10-CM

## 2017-08-09 DIAGNOSIS — R739 Hyperglycemia, unspecified: Secondary | ICD-10-CM | POA: Diagnosis not present

## 2017-08-09 DIAGNOSIS — I2510 Atherosclerotic heart disease of native coronary artery without angina pectoris: Secondary | ICD-10-CM | POA: Diagnosis not present

## 2017-08-09 DIAGNOSIS — J438 Other emphysema: Secondary | ICD-10-CM | POA: Diagnosis not present

## 2017-08-09 DIAGNOSIS — Z85118 Personal history of other malignant neoplasm of bronchus and lung: Secondary | ICD-10-CM | POA: Diagnosis not present

## 2017-08-09 DIAGNOSIS — Z7951 Long term (current) use of inhaled steroids: Secondary | ICD-10-CM

## 2017-08-09 LAB — BASIC METABOLIC PANEL
ANION GAP: 14 (ref 5–15)
BUN: 93 mg/dL — AB (ref 6–20)
CO2: 27 mmol/L (ref 22–32)
Calcium: 9.8 mg/dL (ref 8.9–10.3)
Chloride: 97 mmol/L — ABNORMAL LOW (ref 101–111)
Creatinine, Ser: 1.51 mg/dL — ABNORMAL HIGH (ref 0.44–1.00)
GFR calc Af Amer: 36 mL/min — ABNORMAL LOW (ref >60)
GFR calc non Af Amer: 31 mL/min — ABNORMAL LOW (ref >60)
GLUCOSE: 201 mg/dL — AB (ref 65–99)
POTASSIUM: 5.9 mmol/L — AB (ref 3.5–5.1)
Sodium: 138 mmol/L (ref 135–145)

## 2017-08-09 LAB — CBC WITH DIFFERENTIAL/PLATELET
BASOS PCT: 0 %
Basophils Absolute: 0 10*3/uL (ref 0.0–0.1)
EOS PCT: 0 %
Eosinophils Absolute: 0 10*3/uL (ref 0.0–0.7)
HEMATOCRIT: 53.8 % — AB (ref 36.0–46.0)
HEMOGLOBIN: 16.9 g/dL — AB (ref 12.0–15.0)
LYMPHS PCT: 6 %
Lymphs Abs: 1.8 10*3/uL (ref 0.7–4.0)
MCH: 28.9 pg (ref 26.0–34.0)
MCHC: 31.4 g/dL (ref 30.0–36.0)
MCV: 92.1 fL (ref 78.0–100.0)
Monocytes Absolute: 0.6 10*3/uL (ref 0.1–1.0)
Monocytes Relative: 2 %
NEUTROS ABS: 28.2 10*3/uL — AB (ref 1.7–7.7)
NEUTROS PCT: 92 %
PLATELETS: 264 10*3/uL (ref 150–400)
RBC: 5.84 MIL/uL — ABNORMAL HIGH (ref 3.87–5.11)
RDW: 14.5 % (ref 11.5–15.5)
WBC: 30.6 10*3/uL — ABNORMAL HIGH (ref 4.0–10.5)

## 2017-08-09 LAB — CBG MONITORING, ED: Glucose-Capillary: 192 mg/dL — ABNORMAL HIGH (ref 65–99)

## 2017-08-09 MED ORDER — ONDANSETRON HCL 4 MG/2ML IJ SOLN: 4.0000 mg | Freq: Four times a day (QID) | INTRAMUSCULAR | Status: DC | PRN

## 2017-08-09 MED ORDER — FENTANYL CITRATE (PF) 100 MCG/2ML IJ SOLN
25.0000 ug | INTRAMUSCULAR | Status: DC | PRN
  Administered 2017-08-10: 25 ug via INTRAVENOUS
  Filled 2017-08-09: qty 2

## 2017-08-09 MED ORDER — MOMETASONE FURO-FORMOTEROL FUM 200-5 MCG/ACT IN AERO
2.0000 | INHALATION_SPRAY | Freq: Two times a day (BID) | RESPIRATORY_TRACT | Status: DC
  Administered 2017-08-09 – 2017-08-11 (×4): 2 via RESPIRATORY_TRACT
  Filled 2017-08-09: qty 8.8

## 2017-08-09 MED ORDER — FLUCONAZOLE 100 MG PO TABS
100.0000 mg | ORAL_TABLET | Freq: Every day | ORAL | Status: DC
  Administered 2017-08-10 – 2017-08-11 (×2): 100 mg via ORAL
  Filled 2017-08-09 (×2): qty 1

## 2017-08-09 MED ORDER — FENTANYL CITRATE (PF) 100 MCG/2ML IJ SOLN
50.0000 ug | Freq: Once | INTRAMUSCULAR | Status: AC
Start: 2017-08-09 — End: 2017-08-09
  Administered 2017-08-09: 50 ug via INTRAVENOUS
  Filled 2017-08-09: qty 2

## 2017-08-09 MED ORDER — DEXAMETHASONE 4 MG PO TABS
4.0000 mg | ORAL_TABLET | Freq: Two times a day (BID) | ORAL | Status: DC
  Administered 2017-08-09 – 2017-08-11 (×4): 4 mg via ORAL
  Filled 2017-08-09 (×4): qty 1

## 2017-08-09 MED ORDER — ENOXAPARIN SODIUM 30 MG/0.3ML ~~LOC~~ SOLN
30.0000 mg | SUBCUTANEOUS | Status: DC
  Administered 2017-08-09: 30 mg via SUBCUTANEOUS
  Filled 2017-08-09: qty 0.3

## 2017-08-09 MED ORDER — METOPROLOL TARTRATE 25 MG PO TABS
25.0000 mg | ORAL_TABLET | Freq: Two times a day (BID) | ORAL | Status: DC
  Administered 2017-08-09 – 2017-08-11 (×4): 25 mg via ORAL
  Filled 2017-08-09 (×4): qty 1

## 2017-08-09 MED ORDER — NITROGLYCERIN 0.4 MG SL SUBL: 0.4000 mg | SUBLINGUAL_TABLET | SUBLINGUAL | Status: DC | PRN

## 2017-08-09 MED ORDER — ALBUTEROL SULFATE (2.5 MG/3ML) 0.083% IN NEBU: 2.5000 mg | INHALATION_SOLUTION | RESPIRATORY_TRACT | Status: DC | PRN

## 2017-08-09 MED ORDER — ONDANSETRON HCL 4 MG PO TABS: 4.0000 mg | ORAL_TABLET | Freq: Four times a day (QID) | ORAL | Status: DC | PRN

## 2017-08-09 MED ORDER — DEXAMETHASONE 4 MG PO TABS: 4.0000 mg | ORAL_TABLET | Freq: Four times a day (QID) | ORAL | Status: DC

## 2017-08-09 MED ORDER — SODIUM CHLORIDE 0.9 % IV BOLUS
1000.0000 mL | Freq: Once | INTRAVENOUS | Status: AC
  Administered 2017-08-09: 1000 mL via INTRAVENOUS

## 2017-08-09 MED ORDER — SODIUM CHLORIDE 0.9 % IV SOLN
INTRAVENOUS | Status: AC
Start: 2017-08-09 — End: 2017-08-10
  Administered 2017-08-09: 23:00:00 via INTRAVENOUS

## 2017-08-09 MED ORDER — LEVETIRACETAM 500 MG PO TABS
500.0000 mg | ORAL_TABLET | Freq: Two times a day (BID) | ORAL | Status: DC
  Administered 2017-08-09 – 2017-08-11 (×4): 500 mg via ORAL
  Filled 2017-08-09 (×4): qty 1

## 2017-08-09 NOTE — ED Provider Notes (Signed)
[facility] Provider Note   CSN: [id number] Arrival date & time: 08/09/17  1711     History   Chief Complaint Chief Complaint  Patient presents with  . Weakness  . Emesis  . Diarrhea  . cancer patient  . Fall    HPI Brandy Cameron is a 82 y.o. female.  HPI  82 year old female with a history of metastatic cancer currently receiving radiation therapy presents with weakness.  History is mostly taken from the daughter.  The patient has brain metastases in addition to diffuse abdominal metastases.  She most recently received radiation therapy 1 week ago.  Since then she has been having diarrhea.  She is also been having nausea, vomiting, and GERD.  She has become progressively more weak over the last 3 days and fell today while in the bathroom.  She did not injure herself and more sat down on the floor on her bottom and could not get up.  Family could get her up either.  At this point, the radiation and other treatments are palliative to improve quality of life according to family.  The patient is desiring hospice and states she wants to die.  Unfortunately there are no hospice sites available in [place] or [place] and she currently has no other help at home besides family.  She rates her pain diffusely as severe 10/10.  She is not on anything for pain.  Past Medical History:  Diagnosis Date  . Asthma   . CAD (coronary artery disease)    a. LHC 9/11 (in setting of NSTEMI): LAD irregs, mLCx 50, pRCA 50, inf-apical AK;  b. Myoview 9/13: Normal stress nuclear study.  LV Ejection Fraction: 71%   . Carotid stenosis    a. Carotid US 9/13: bilat 0-39%;  b. Carotid US 7/15: no sig stenosis  . Chronic diastolic CHF (congestive heart failure) ([place])    a. Echo 7/15: Mild LVH, mild focal basal septal hypertrophy, EF 50-55%, no RWMA, Gr 1 DD  . COPD (chronic obstructive pulmonary disease) ([place])   . HTN (hypertension)   . Lung cancer [facility])    She has  had a LUL VATS for lung cancer and radioactive seed implantation on October of 2010.  Marland Kitchen NSTEMI (non-ST elevated myocardial infarction) ([place])    with only mild to moderate CAD in September of 2011,There was concern for apival ballooning. >> ? Tako-Tsubo syndrome vs vasospasm    Patient Active Problem List   Diagnosis Date Noted  . Lung cancer metastatic to brain ([place]) 08/09/2017  . Acute kidney injury ([place])   . Hyperkalemia   . Goals of care, counseling/discussion 08/03/2017  . Aphasia   . Metastatic cancer ([place])   . Brain metastasis ([place]) 07/18/2017  . Lower abdominal pain 06/27/2017  . Intractable vomiting with nausea 06/13/2017  . Chronic respiratory failure ([place]) 10/14/2015  . Hyperglycemia 10/07/2015  . Menopause ovarian failure 10/07/2015  . Encounter for colorectal cancer screening 10/07/2015  . COPD (chronic obstructive pulmonary disease) ([place]) 08/29/2015  . GERD (gastroesophageal reflux disease) 08/29/2015  . Cerebrovascular disease 08/08/2013  . Asthma 12/26/2010  . CAD (coronary artery disease) 11/11/2010  . Hypertension 11/11/2010  . Hyperlipidemia with target LDL less than 70 11/11/2010  . Primary cancer of left upper lobe of lung ([place]) 11/11/2010    Past Surgical History:  Procedure Laterality Date  . CATARACT SURGERY    . FOOT SURGERY    . GALLBLADDER SURGERY    .  KNEE SURGERY    . LUNG LOBECTOMY    . VESICOVAGINAL FISTULA CLOSURE W/ TAH       OB History   None      Home Medications    Prior to Admission medications   Medication Sig Start Date End Date Taking? Authorizing Provider  aluminum hydroxide-magnesium carbonate (GAVISCON) 95-358 MG/15ML SUSP Take 15 mLs by mouth 2 (two) times daily as needed for indigestion or heartburn.    Yes [provider]  aspirin EC 81 MG tablet Take 81 mg by mouth daily.   Yes [provider]  budesonide-formoterol (SYMBICORT) 160-4.5 MCG/ACT inhaler Inhale 2 puffs into the lungs 2 (two) times daily  as needed (sob and wheezing).   Yes [provider]  dexamethasone (DECADRON) 4 MG tablet Take 1 tablet (4 mg total) by mouth 4 (four) times daily. Patient taking differently: Take 4 mg by mouth 2 (two) times daily.  07/23/17  Yes Donne Hazel, MD  donepezil (ARICEPT) 5 MG tablet Take 1/2 tablet daily for 2 weeks, then increase to 1 tablet daily Patient taking differently: Take 2.5-5 mg by mouth See admin instructions. Start date 07/07/17: Take 1/2 tablet (2.5 mg) daily at bedtime for 2 weeks, then increase to 1 tablet (5 mg) daily at bedtime 07/07/17  Yes Cameron Sprang, MD  fluconazole (DIFLUCAN) 100 MG tablet Take 1 tablet (100 mg total) by mouth daily. 08/03/17  Yes Curcio, Roselie Awkward, NP  furosemide (LASIX) 40 MG tablet Take 2 tablets (80 mg total) by mouth daily. Patient taking differently: Take 40 mg by mouth 2 (two) times daily.  07/13/17  Yes Lelon Perla, MD  levETIRAcetam (KEPPRA) 500 MG tablet Take 1 tablet (500 mg total) by mouth 2 (two) times daily. 07/23/17  Yes Donne Hazel, MD  losartan (COZAAR) 100 MG tablet Take 1 tablet (100 mg total) by mouth daily. 07/12/17  Yes [name], MD  metoprolol tartrate (LOPRESSOR) 25 MG tablet Take 1 tablet (25 mg total) by mouth 2 (two) times daily. 07/12/17  Yes [name], MD  omeprazole (PRILOSEC) 20 MG capsule Take 1 capsule (20 mg total) by mouth daily. 08/03/17  Yes Curcio, Roselie Awkward, NP  OXYGEN Inhale 2 L into the lungs continuous.    Yes [provider]  potassium chloride SA (K-DUR,KLOR-CON) 20 MEQ tablet Take 20 mEq by mouth daily.   Yes [provider]  nitroGLYCERIN (NITROSTAT) 0.4 MG SL tablet Place 0.4 mg under the tongue every 5 (five) minutes as needed for chest pain.     [provider]  Spacer/Aero-Holding Chambers (AEROCHAMBER MV) inhaler Use as instructed 08/29/15   [name], MD    Family History Family History  Problem Relation Age of Onset  . Heart disease Mother 78    . Asthma Mother   . Diabetes Mother   . Cancer Mother        Salivary  . Emphysema Mother   . Heart disease Unknown        POSITIVE FAMILY HX. OF   . Pancreatitis Unknown        QUESTIONABLE FROM ACE INHIBITORS  . COPD Sister   . Dementia Brother   . Stroke Brother     Social History Social History   Tobacco Use  . Smoking status: Former Smoker    Packs/day: 1.50    Years: 42.00    Pack years: 63.00    Types: Cigarettes    Last attempt to  quit: 04/26/2006    Years since quitting: 11.2  . Smokeless tobacco: Never Used  Substance Use Topics  . Alcohol use: No    Alcohol/week: 0.0 oz  . Drug use: No     Allergies   Augmentin [amoxicillin-pot clavulanate]; Sulfa antibiotics; Tape; Amlodipine; Atorvastatin; Pantoprazole; and Sulfonamide derivatives   Review of Systems Review of Systems  Constitutional: Negative for fever.  Respiratory: Positive for shortness of breath (chronic, worsening). Negative for cough.   Cardiovascular: Negative for chest pain.  Gastrointestinal: Positive for abdominal pain, nausea and vomiting.  Neurological: Positive for weakness.  All other systems reviewed and are negative.    Physical Exam Updated Vital Signs BP 140/64 (BP Location: Right Arm)   Pulse 78   Temp (!) 97.4 F (36.3 C) (Oral)   Resp 16   Ht 5\' 1"  (1.549 m)   Wt 83.4 kg (183 lb 13.8 oz)   SpO2 98%   BMI 34.74 kg/m   Physical Exam  Constitutional: She is oriented to person, place, and time. She appears well-developed and well-nourished. No distress.  obese  HENT:  Head: Normocephalic and atraumatic.  Right Ear: External ear normal.  Left Ear: External ear normal.  Nose: Nose normal.  Mouth/Throat: Mucous membranes are dry.  Eyes: Right eye exhibits no discharge. Left eye exhibits no discharge.  Neck: Neck supple.  Cardiovascular: Normal rate, regular rhythm and normal heart sounds.  Pulmonary/Chest: Effort normal and breath sounds normal.  Abdominal: Soft.  There is tenderness (mild, diffuse).  Neurological: She is alert and oriented to person, place, and time.  Skin: Skin is warm and dry. She is not diaphoretic.  Nursing note and vitals reviewed.    ED Treatments / Results  Labs (all labs ordered are listed, but only abnormal results are displayed) Labs Reviewed  BASIC METABOLIC PANEL - Abnormal; Notable for the following components:      Result Value   Potassium 5.9 (*)    Chloride 97 (*)    Glucose, Bld 201 (*)    BUN 93 (*)    Creatinine, Ser 1.51 (*)    GFR calc non Af Amer 31 (*)    GFR calc Af Amer 36 (*)    All other components within normal limits  CBC WITH DIFFERENTIAL/PLATELET - Abnormal; Notable for the following components:   WBC 30.6 (*)    RBC 5.84 (*)    Hemoglobin 16.9 (*)    HCT 53.8 (*)    Neutro Abs 28.2 (*)    All other components within normal limits  CBG MONITORING, ED - Abnormal; Notable for the following components:   Glucose-Capillary 192 (*)    All other components within normal limits  C DIFFICILE QUICK SCREEN W PCR REFLEX  URINALYSIS, ROUTINE W REFLEX MICROSCOPIC  BASIC METABOLIC PANEL  CBC    EKG EKG Interpretation  Date/Time:  Tuesday August 09 2017 17:19:19 EDT Ventricular Rate:  73 PR Interval:    QRS Duration: 78 QT Interval:  432 QTC Calculation: 477 R Axis:   -17 Text Interpretation:  Sinus rhythm Borderline left axis deviation Low voltage, extremity and precordial leads Baseline wander in lead(s) V1 nonspecific T wave flattening new since Mar 2019 Confirmed by Sherwood Gambler [phone]) on 08/09/2017 6:02:04 PM   Radiology Dg Chest 1 View  Result Date: 08/09/2017 CLINICAL DATA:  82 y/o  F; cough and weakness. EXAM: CHEST  1 VIEW COMPARISON:  07/13/2017 PET-CT. 06/28/2017 chest CT. 06/27/2017 chest radiograph. FINDINGS: Stable normal cardiac silhouette given  projection and technique. Postsurgical changes in the left hilum. Ill-defined left suprahilar soft tissue likely corresponding  mass on prior PET CT. Small left effusion. No new consolidation or pneumothorax identified. Bones are unremarkable. IMPRESSION: Ill-defined left hilar opacification likely corresponding to mass on prior PET-CT. Small left effusion. Electronically Signed   By: Kristine Garbe M.D.   On: 08/09/2017 21:10    Procedures Procedures (including critical care time)  Medications Ordered in ED Medications  albuterol (PROVENTIL) (2.5 MG/3ML) 0.083% nebulizer solution 2.5 mg (has no administration in time range)  mometasone-formoterol (DULERA) 200-5 MCG/ACT inhaler 2 puff (2 puffs Inhalation Given 08/09/17 2143)  fluconazole (DIFLUCAN) tablet 100 mg (has no administration in time range)  levETIRAcetam (KEPPRA) tablet 500 mg (500 mg Oral Given 08/09/17 2256)  metoprolol tartrate (LOPRESSOR) tablet 25 mg (25 mg Oral Given 08/09/17 2256)  nitroGLYCERIN (NITROSTAT) SL tablet 0.4 mg (has no administration in time range)  enoxaparin (LOVENOX) injection 30 mg (30 mg Subcutaneous Given 08/09/17 2257)  0.9 %  sodium chloride infusion ( Intravenous New Bag/Given 08/09/17 2257)  ondansetron (ZOFRAN) tablet 4 mg (has no administration in time range)    Or  ondansetron (ZOFRAN) injection 4 mg (has no administration in time range)  fentaNYL (SUBLIMAZE) injection 25 mcg (has no administration in time range)  dexamethasone (DECADRON) tablet 4 mg (4 mg Oral Given 08/09/17 2256)  sodium chloride 0.9 % bolus 1,000 mL (1,000 mLs Intravenous Transfusing/Transfer 08/09/17 2009)  fentaNYL (SUBLIMAZE) injection 50 mcg (50 mcg Intravenous Given 08/09/17 1803)     Initial Impression / Assessment and Plan / ED Course  I have reviewed the triage vital signs and the nursing notes.  Pertinent labs & imaging results that were available during my care of the patient were reviewed by me and considered in my medical decision making (see chart for details).     I had a long discussion with the patient and family.  She is not  interested in any significant treatment besides pain control and possibly fluids.  She understands that this may be her dying.  She wants to terminate all life prolonging treatments such as chemotherapy or radiation.  Given this, no further investigation is really warranted and since hospice is not available as an outpatient at this time she will be admitted for pain and symptom control and palliative care consultation in the morning for hopefully hospice treatment.  Admit to the hospitalist.  Final Clinical Impressions(s) / ED Diagnoses   Final diagnoses:  Acute kidney injury ([place])  Hyperkalemia  Metastatic cancer [facility])    ED Discharge Orders    None       Sherwood Gambler, MD 08/10/17 [phone]

## 2017-08-09 NOTE — ED Triage Notes (Signed)
Patient had to be helped out of the car. Patient 's daughter reports that the patient has had N/v/D x 2-3 days. Patient was trying to get on the toilet today and fell. Patient's daughter states the patient fell on her buttocks. Patient did not hit her head or have LOC. Patient c/o abdominal pain. Patient has cancer.

## 2017-08-09 NOTE — Telephone Encounter (Signed)
Copied from [place] [id number]. Topic: General - Other >> Aug 09, 2017  4:12 PM Valla Leaver wrote: Reason for CRM: Jackelyn Poling, with [facility] calling to notify Dr, Ronnald Ramp that EMS called out to her home for a fall but patient was not cooperative. Her family would like a hospice eval which is what Jackelyn Poling would like an order for. Needs to know if Ronnald Ramp will be the PCP for hospice care and if her prognosis could be six months or less?

## 2017-08-09 NOTE — Telephone Encounter (Signed)
Hilda Blades (pt dtr) contacted and informed that we are okay with orders. Spoke to dtr about Hospice being an option.   Dtr agreed with the Hospice referral.  Hospice has been contacted and order has been entered in [place] and informed Almyra Free with [facility] that verbal orders are denied for the time being and that we have requested Hospice to come in and evaluate.

## 2017-08-09 NOTE — ED Notes (Signed)
ED TO INPATIENT HANDOFF REPORT  Name/Age/Gender Brandy Cameron 82 y.o. female  Code Status Code Status History    Date Active Date Inactive Code Status Order ID Comments User Context   07/20/2017 1715 07/23/2017 1920 DNR [id number]  Reyne Dumas, MD Inpatient   07/18/2017 2137 07/20/2017 1715 Full Code [id number]  [name], MD ED   08/30/2013 0033 09/01/2013 2048 Full Code [id number]  [name], MD Inpatient    Questions for Most Recent Historical Code Status (Order [id number])    Question Answer Comment   In the event of cardiac or respiratory ARREST Do not call a "code blue"    In the event of cardiac or respiratory ARREST Do not perform Intubation, CPR, defibrillation or ACLS    In the event of cardiac or respiratory ARREST Use medication by any route, position, wound care, and other measures to relive pain and suffering. May use oxygen, suction and manual treatment of airway obstruction as needed for comfort.         Advance Directive Documentation     Most Recent Value  Type of Advance Directive  Healthcare Power of Attorney, Living will [daughter states she is a dnr]  Pre-existing out of facility DNR order (yellow form or pink MOST form)  -  "MOST" Form in Place?  -      Home/SNF/Other Home  Chief Complaint Ca pt, Fall, High blood pressure, emesis. weakness  Level of Care/Admitting Diagnosis ED Disposition    ED Disposition Condition Comment   Admit  Hospital Area: [facility] [[id number]]  Level of Care: Telemetry [5]  Admit to tele based on following criteria: Other see comments  Comments: Hyperkalemia  Diagnosis: Lung cancer metastatic to brain [facility]) [[id number]]  Admitting Physician: Bethena Roys [phone]  Attending Physician: Bethena Roys [phone]  Estimated length of stay: past midnight tomorrow  Certification:: I certify this patient will need inpatient services for at least 2 midnights  PT Class (Do Not Modify):  Inpatient [101]  PT Acc Code (Do Not Modify): Private [1]       Medical History Past Medical History:  Diagnosis Date  . Asthma   . CAD (coronary artery disease)    a. LHC 9/11 (in setting of NSTEMI): LAD irregs, mLCx 50, pRCA 50, inf-apical AK;  b. Myoview 9/13: Normal stress nuclear study.  LV Ejection Fraction: 71%   . Carotid stenosis    a. Carotid US 9/13: bilat 0-39%;  b. Carotid US 7/15: no sig stenosis  . Chronic diastolic CHF (congestive heart failure) ([place])    a. Echo 7/15: Mild LVH, mild focal basal septal hypertrophy, EF 50-55%, no RWMA, Gr 1 DD  . COPD (chronic obstructive pulmonary disease) ([place])   . HTN (hypertension)   . Lung cancer [facility])    [name]  Marland Kitchen NSTEMI (non-ST elevated myocardial infarction) ([place])    with only mild to moderate CAD in September of 2011,There was concern for apival ballooning. >> ? Tako-Tsubo syndrome vs vasospasm    Allergies Allergies  Allergen Reactions  . Augmentin [Amoxicillin-Pot Clavulanate] Diarrhea    Has patient had a PCN reaction causing immediate rash, facial/tongue/throat swelling, SOB or lightheadedness with hypotension: No Has patient had a PCN reaction causing severe rash involving mucus membranes or skin necrosis: No Has patient had a PCN reaction that required hospitalization: No Has patient had a PCN reaction occurring within  the last 10 years: No If all of the above answers are "NO", then may proceed with Cephalosporin use.   . Sulfa Antibiotics Rash and Shortness Of Breath  . Tape Other (See Comments)  . Amlodipine Other (See Comments)    Unknown reaction  . Atorvastatin Other (See Comments)    Leg pain/weakness  . Pantoprazole Nausea And Vomiting  . Sulfonamide Derivatives Rash    IV Location/Drains/Wounds Patient Lines/Drains/Airways Status   Active Line/Drains/Airways    Name:   Placement date:   Placement time:   Site:    Days:   Peripheral IV 08/09/17 Right Antecubital   08/09/17    1901    Antecubital   less than 1   Incision (Closed) 11/22/14 Face Left   11/22/14    1057     991          Labs/Imaging Results for orders placed or performed during the hospital encounter of 08/09/17 (from the past 48 hour(s))  CBG monitoring, ED     Status: Abnormal   Collection Time: 08/09/17  5:36 PM  Result Value Ref Range   Glucose-Capillary 192 (H) 65 - 99 mg/dL  Basic metabolic panel     Status: Abnormal   Collection Time: 08/09/17  5:47 PM  Result Value Ref Range   Sodium 138 135 - 145 mmol/L   Potassium 5.9 (H) 3.5 - 5.1 mmol/L   Chloride 97 (L) 101 - 111 mmol/L   CO2 27 22 - 32 mmol/L   Glucose, Bld 201 (H) 65 - 99 mg/dL   BUN 93 (H) 6 - 20 mg/dL   Creatinine, Ser 1.51 (H) 0.44 - 1.00 mg/dL   Calcium 9.8 8.9 - 10.3 mg/dL   GFR calc non Af Amer 31 (L) >60 mL/min   GFR calc Af Amer 36 (L) >60 mL/min    Comment: (NOTE) The eGFR has been calculated using the CKD EPI equation. This calculation has not been validated in all clinical situations. eGFR's persistently <60 mL/min signify possible Chronic Kidney Disease.    Anion gap 14 5 - 15    Comment: Performed at [facility], [place] [address], [place], [place] [address]  CBC with Differential     Status: Abnormal   Collection Time: 08/09/17  5:47 PM  Result Value Ref Range   WBC 30.6 (H) 4.0 - 10.5 K/uL   RBC 5.84 (H) 3.87 - 5.11 MIL/uL   Hemoglobin 16.9 (H) 12.0 - 15.0 g/dL   HCT 53.8 (H) 36.0 - 46.0 %   MCV 92.1 78.0 - 100.0 fL   MCH 28.9 26.0 - 34.0 pg   MCHC 31.4 30.0 - 36.0 g/dL   RDW 14.5 11.5 - 15.5 %   Platelets 264 150 - 400 K/uL   Neutrophils Relative % 92 %   Lymphocytes Relative 6 %   Monocytes Relative 2 %   Eosinophils Relative 0 %   Basophils Relative 0 %   Neutro Abs 28.2 (H) 1.7 - 7.7 K/uL   Lymphs Abs 1.8 0.7 - 4.0 K/uL   Monocytes Absolute 0.6 0.1 - 1.0 K/uL   Eosinophils Absolute 0.0 0.0 - 0.7 K/uL    Basophils Absolute 0.0 0.0 - 0.1 K/uL   RBC Morphology STOMATOCYTES    WBC Morphology WHITE COUNT CONFIRMED ON SMEAR     Comment: Performed at [facility], [place] [address] [place], [place] [address]   No results found.  Pending Labs [employer] (From admission, onward)  Start     Ordered   08/09/17 2007  Gastrointestinal Panel by PCR , Stool  (Gastrointestinal Panel by PCR, Stool)  Once,   R     08/09/17 2006   08/09/17 2007  Urinalysis, Routine w reflex microscopic  Once,   R     08/09/17 2006   08/09/17 1724  Urinalysis, Routine w reflex microscopic  Once,   STAT     08/09/17 1724   Signed and Held  Basic metabolic panel  Tomorrow morning,   R     Signed and Held   Signed and Held  CBC  Tomorrow morning,   R     Signed and Held      Vitals/Pain Today's Vitals   08/09/17 1814 08/09/17 1901 08/09/17 1930 08/09/17 1954  BP:  121/72 127/72   Pulse:  76 71   Resp:  17 (!) 26   Temp: (!) 97.5 F (36.4 C)     TempSrc: Axillary     SpO2:  98% 99%   Weight:      Height:      PainSc:    1     Isolation Precautions Enteric precautions (UV disinfection)  Medications Medications  sodium chloride 0.9 % bolus 1,000 mL (1,000 mLs Intravenous New Bag/Given 08/09/17 1801)  fentaNYL (SUBLIMAZE) injection 50 mcg (50 mcg Intravenous Given 08/09/17 1803)    Mobility non-ambulatory

## 2017-08-09 NOTE — H&P (Signed)
History and Physical    Yamilette Garretson Maskell FGH:[id number] DOB: 1935-07-26 DOA: 08/09/2017  PCP: [name], MD   Patient coming from: home  Chief Complaint: Weakness, Diarrhea, vomiting.  HPI: Brandy Cameron is a 82 y.o. female with medical history significant for Metastatic Lung Non-small cell cancer- Brain and abdomen-adrenal, Asthma, COPD, CVA, HTN, CAD, who was brought to the ED by family with reports 2 episodes of loose stools of 3 days duration, nausea vomiting for several weeks, and reduced p.o. Intake.  Patient also reports increased productive cough-whitish creamy sputum, unchanged shortness of breath-on chronic 2 L O2.  Patient denies fever or chills.  Over the past 2-3 days patient has not been able to get up from the commode requiring assistance from family.  Today she tried to get up and slid towards the floor on her butt.  She endorses generalized body aches chronic from metastatic cancer.  She Is able to move her legs, denies specific hip pain.  Has a history of recurrent non-small cell lung cancer diagnosed 2010 now with metastases brain and abdomen, s/p left upper lobe wedge resection with seed implants, completed whole brain radiation.  Follows with Dr. Earlie Server.  She has been getting palliative radiation treatments last treatment, 08/02/16.  Last visit 4/10-options going forward included palliative care/hospice versus treatment with immunotherapy.  Patient's today is obtained to stop all treatment, and proceed with palliative care.  Unfortunately, there are no hospice beds available [place] or [place].  Patient is requiring more care which her daughter and significant other not able to provide as they have to go to work. Patient tells me today that she is tired, and does not want any more treatment.  ED Course: Elevated blood pressure systolic 678L down to 381O, tachypnea.  Creatinine elevated 1.51, WBC elevated at 30.6.  K elevated 5.9.  CBG was 201.  EKG no  significant change from prior.  1 L bolus given in ED. Hospitalist called to admit for failure to thrive, AKI.   Review of Systems: As per HPI otherwise 10 point review of systems negative.  Past Medical History:  Diagnosis Date  . Asthma   . CAD (coronary artery disease)    a. LHC 9/11 (in setting of NSTEMI): LAD irregs, mLCx 50, pRCA 50, inf-apical AK;  b. Myoview 9/13: Normal stress nuclear study.  LV Ejection Fraction: 71%   . Carotid stenosis    a. Carotid US 9/13: bilat 0-39%;  b. Carotid US 7/15: no sig stenosis  . Chronic diastolic CHF (congestive heart failure) ([place])    a. Echo 7/15: Mild LVH, mild focal basal septal hypertrophy, EF 50-55%, no RWMA, Gr 1 DD  . COPD (chronic obstructive pulmonary disease) ([place])   . HTN (hypertension)   . Lung cancer [facility])    [name]  Marland Kitchen NSTEMI (non-ST elevated myocardial infarction) ([place])    with only mild to moderate CAD in September of 2011,There was concern for apival ballooning. >> ? Tako-Tsubo syndrome vs vasospasm    Past Surgical History:  Procedure Laterality Date  . CATARACT SURGERY    . FOOT SURGERY    . GALLBLADDER SURGERY    . KNEE SURGERY    . LUNG LOBECTOMY    . VESICOVAGINAL FISTULA CLOSURE W/ TAH       reports that she quit smoking about 11 years ago. Her smoking use included cigarettes. She has a 63.00  pack-year smoking history. She has never used smokeless tobacco. She reports that she does not drink alcohol or use drugs.  Allergies  Allergen Reactions  . Augmentin [Amoxicillin-Pot Clavulanate] Diarrhea    Has patient had a PCN reaction causing immediate rash, facial/tongue/throat swelling, SOB or lightheadedness with hypotension: No Has patient had a PCN reaction causing severe rash involving mucus membranes or skin necrosis: No Has patient had a PCN reaction that required hospitalization: No Has patient had a PCN reaction occurring  within the last 10 years: No If all of the above answers are "NO", then may proceed with Cephalosporin use.   . Sulfa Antibiotics Rash and Shortness Of Breath  . Tape Other (See Comments)  . Amlodipine Other (See Comments)    Unknown reaction  . Atorvastatin Other (See Comments)    Leg pain/weakness  . Pantoprazole Nausea And Vomiting  . Sulfonamide Derivatives Rash    Family History  Problem Relation Age of Onset  . Heart disease Mother 81  . Asthma Mother   . Diabetes Mother   . Cancer Mother        Salivary  . Emphysema Mother   . Heart disease Unknown        POSITIVE FAMILY HX. OF   . Pancreatitis Unknown        QUESTIONABLE FROM ACE INHIBITORS  . COPD Sister   . Dementia Brother   . Stroke Brother     Prior to Admission medications   Medication Sig Start Date End Date Taking? Authorizing Provider  aluminum hydroxide-magnesium carbonate (GAVISCON) 95-358 MG/15ML SUSP Take 15 mLs by mouth 2 (two) times daily as needed for indigestion or heartburn.    Yes [provider]  aspirin EC 81 MG tablet Take 81 mg by mouth daily.   Yes [provider]  budesonide-formoterol (SYMBICORT) 160-4.5 MCG/ACT inhaler Inhale 2 puffs into the lungs 2 (two) times daily as needed (sob and wheezing).   Yes [provider]  dexamethasone (DECADRON) 4 MG tablet Take 1 tablet (4 mg total) by mouth 4 (four) times daily. Patient taking differently: Take 4 mg by mouth 2 (two) times daily.  07/23/17  Yes Donne Hazel, MD  donepezil (ARICEPT) 5 MG tablet Take 1/2 tablet daily for 2 weeks, then increase to 1 tablet daily Patient taking differently: Take 2.5-5 mg by mouth See admin instructions. Start date 07/07/17: Take 1/2 tablet (2.5 mg) daily at bedtime for 2 weeks, then increase to 1 tablet (5 mg) daily at bedtime 07/07/17  Yes Cameron Sprang, MD  fluconazole (DIFLUCAN) 100 MG tablet Take 1 tablet (100 mg total) by mouth daily. 08/03/17  Yes Curcio, Roselie Awkward, NP    furosemide (LASIX) 40 MG tablet Take 2 tablets (80 mg total) by mouth daily. Patient taking differently: Take 40 mg by mouth 2 (two) times daily.  07/13/17  Yes Lelon Perla, MD  levETIRAcetam (KEPPRA) 500 MG tablet Take 1 tablet (500 mg total) by mouth 2 (two) times daily. 07/23/17  Yes Donne Hazel, MD  losartan (COZAAR) 100 MG tablet Take 1 tablet (100 mg total) by mouth daily. 07/12/17  Yes [name], MD  metoprolol tartrate (LOPRESSOR) 25 MG tablet Take 1 tablet (25 mg total) by mouth 2 (two) times daily. 07/12/17  Yes [name], MD  omeprazole (PRILOSEC) 20 MG capsule Take 1 capsule (20 mg total) by mouth daily. 08/03/17  Yes Curcio, Roselie Awkward, NP  OXYGEN Inhale 2 L into the lungs continuous.  Yes [provider]  potassium chloride SA (K-DUR,KLOR-CON) 20 MEQ tablet Take 20 mEq by mouth daily.   Yes [provider]  nitroGLYCERIN (NITROSTAT) 0.4 MG SL tablet Place 0.4 mg under the tongue every 5 (five) minutes as needed for chest pain.     [provider]  Spacer/Aero-Holding Chambers (AEROCHAMBER MV) inhaler Use as instructed 08/29/15   [name], MD    Physical Exam: Vitals:   08/09/17 1800 08/09/17 1814 08/09/17 1901 08/09/17 1930  BP: 109/64  121/72 127/72  Pulse: 76  76 71  Resp: (!) 24  17 (!) 26  Temp:  (!) 97.5 F (36.4 C)    TempSrc:  Axillary    SpO2: 98%  98% 99%  Weight:      Height:        Constitutional: NAD, calm, comfortable Vitals:   08/09/17 1800 08/09/17 1814 08/09/17 1901 08/09/17 1930  BP: 109/64  121/72 127/72  Pulse: 76  76 71  Resp: (!) 24  17 (!) 26  Temp:  (!) 97.5 F (36.4 C)    TempSrc:  Axillary    SpO2: 98%  98% 99%  Weight:      Height:       Eyes: PERRL, lids and conjunctivae normal ENMT: Mucous membranes are dry, white coating on tongue posterior pharynx clear of any exudate or lesions. Neck: normal, supple Respiratory: clear to auscultation bilaterally, reduced breath sounds- left.  TAchypnea, mild  accessory muscle use.  Cardiovascular: Regular rate and rhythm, no murmurs / rubs / gallops. No extremity edema. 2+ pedal pulses. No carotid bruits.  Abdomen: no tenderness, no masses palpated. No hepatosplenomegaly. Bowel sounds positive.  Musculoskeletal: no clubbing / cyanosis. No joint deformity upper and lower extremities. Good ROM, no contractures. Normal muscle tone.  Skin: no rashes, lesions, ulcers. No induration Neurologic: CN 2-12 grossly intact.. Strength 4/5 in all 4.  Psychiatric: Normal judgment and insight. Alert and oriented x 3. Normal mood.   Labs on Admission: I have personally reviewed following labs and imaging studies  CBC: Recent Labs  Lab 08/09/17 1747  WBC 30.6*  NEUTROABS 28.2*  HGB 16.9*  HCT 53.8*  MCV 92.1  PLT 419   Basic Metabolic Panel: Recent Labs  Lab 08/09/17 1747  NA 138  K 5.9*  CL 97*  CO2 27  GLUCOSE 201*  BUN 93*  CREATININE 1.51*  CALCIUM 9.8   CBG: Recent Labs  Lab 08/09/17 1736  GLUCAP 192*   Urine analysis:    Component Value Date/Time   COLORURINE YELLOW 04/27/2017 [place] 04/27/2017 1447   LABSPEC 1.010 04/27/2017 1447   PHURINE 7.0 04/27/2017 1447   GLUCOSEU NEGATIVE 04/27/2017 1447   HGBUR SMALL (A) 04/27/2017 1447   BILIRUBINUR neg 07/15/2017 1135   KETONESUR NEGATIVE 04/27/2017 1447   PROTEINUR 1 07/15/2017 1135   PROTEINUR 100 (A) 02/06/2016 1323   UROBILINOGEN 0.2 07/15/2017 1135   UROBILINOGEN 0.2 04/27/2017 1447   NITRITE neg 07/15/2017 1135   NITRITE NEGATIVE 04/27/2017 1447   LEUKOCYTESUR Negative 07/15/2017 1135    Radiological Exams on Admission: No results found.  EKG: Independently reviewed.  No change from prior.  Assessment/Plan Active Problems:   Hypertension   COPD (chronic obstructive pulmonary disease) (HCC)   Intractable vomiting with nausea   Metastatic cancer ([place])   Lung cancer metastatic to brain (HCC)  AKI- Cr- 1.5, baseline 0.7-0.8.   Likely prerenal.  Repeat diarrhea vomiting poor p.o. intake with ongoing  Lasix 80 daily and Cozaar.  1 L bolus given in ED - hydrate Ns 100cc/hr x 12 hrs - BMP a.m -Hold home Lasix and Cozaar  Vomiting, diarrhea-likely related to malignancy, ?radiation therapy.  Reports one episode of blood in stools 2 days ago but she has subsequently had multiple episodes of nonbloody stools.  Hemoglobin elevated likely from hemoconcentration. -Stool C. diff - hydrate -DVT prophylaxis with Lovenox as patient is high risk for DVT - Zofran  Leukocytosis- 30. New.  She reports productive cough, worsening.  Afebrile.  - Chest xray Port - UA - Stool C.diff  Hyperkalemia-likely from renal impairment ongoing ARB use. - hydrate, BMP a.m  Lung cancer with metastasis-brain and adrenal glands-follows with Dr. Julien Nordmann.  Patient has agreed to palliative/hospice care. Recent admissions for seizures. -Continue home Keppra -Palliative care consult a.m. - Fentanyl  For pain for now with renal impairment  - Cont home decadron 4mg  BID, to start 4 mg daily 4/19. -Reports patient slid off the commode today, patient able to move legs without pain, deferred pelvic x-ray for now   DVT prophylaxis: Lovenox Code Status: DNR-confirmed with patient and patient's daughter [name] at bedside with [facility]. Family Communication: Daughter Hilda Blades and Debra's significant other. disposition Plan: > 2 days Consults called: Palliatice care a.m Admission status: Inpt, Tele- for hyperkalemia    Bethena Roys MD [facility] Pager [phone] From 6PM-2AM.  Otherwise please contact night-coverage [email or web address] Password [email or web address]  08/09/2017, 8:06 PM

## 2017-08-09 NOTE — Telephone Encounter (Signed)
Debbie with [facility] contacted and verbal okay to admit with their Hospice. Informed that [facility] was contacted and that Dr. Ronnald Ramp will be the attending of records.

## 2017-08-10 ENCOUNTER — Encounter (HOSPITAL_COMMUNITY): Payer: Self-pay | Admitting: Internal Medicine

## 2017-08-10 DIAGNOSIS — Z7189 Other specified counseling: Secondary | ICD-10-CM

## 2017-08-10 DIAGNOSIS — Z515 Encounter for palliative care: Secondary | ICD-10-CM

## 2017-08-10 DIAGNOSIS — R627 Adult failure to thrive: Secondary | ICD-10-CM

## 2017-08-10 LAB — BASIC METABOLIC PANEL
ANION GAP: 10 (ref 5–15)
BUN: 91 mg/dL — ABNORMAL HIGH (ref 6–20)
CHLORIDE: 102 mmol/L (ref 101–111)
CO2: 28 mmol/L (ref 22–32)
Calcium: 9.3 mg/dL (ref 8.9–10.3)
Creatinine, Ser: 1.24 mg/dL — ABNORMAL HIGH (ref 0.44–1.00)
GFR calc non Af Amer: 40 mL/min — ABNORMAL LOW (ref >60)
GFR, EST AFRICAN AMERICAN: 46 mL/min — AB (ref >60)
Glucose, Bld: 168 mg/dL — ABNORMAL HIGH (ref 65–99)
Potassium: 5.1 mmol/L (ref 3.5–5.1)
Sodium: 140 mmol/L (ref 135–145)

## 2017-08-10 LAB — CBC
HEMATOCRIT: 49.1 % — AB (ref 36.0–46.0)
HEMOGLOBIN: 15 g/dL (ref 12.0–15.0)
MCH: 28.2 pg (ref 26.0–34.0)
MCHC: 30.5 g/dL (ref 30.0–36.0)
MCV: 92.5 fL (ref 78.0–100.0)
Platelets: 202 10*3/uL (ref 150–400)
RBC: 5.31 MIL/uL — AB (ref 3.87–5.11)
RDW: 14.4 % (ref 11.5–15.5)
WBC: 21 10*3/uL — ABNORMAL HIGH (ref 4.0–10.5)

## 2017-08-10 LAB — GLUCOSE, CAPILLARY
GLUCOSE-CAPILLARY: 182 mg/dL — AB (ref 65–99)
GLUCOSE-CAPILLARY: 159 mg/dL — AB (ref 65–99)
Glucose-Capillary: 161 mg/dL — ABNORMAL HIGH (ref 65–99)

## 2017-08-10 MED ORDER — ENSURE ENLIVE PO LIQD
237.0000 mL | Freq: Two times a day (BID) | ORAL | Status: DC
  Administered 2017-08-10 – 2017-08-11 (×3): 237 mL via ORAL

## 2017-08-10 MED ORDER — INSULIN ASPART 100 UNIT/ML ~~LOC~~ SOLN
0.0000 [IU] | Freq: Three times a day (TID) | SUBCUTANEOUS | Status: DC
  Administered 2017-08-10 – 2017-08-11 (×4): 2 [IU] via SUBCUTANEOUS

## 2017-08-10 MED ORDER — ENOXAPARIN SODIUM 40 MG/0.4ML ~~LOC~~ SOLN
40.0000 mg | SUBCUTANEOUS | Status: DC
  Administered 2017-08-10: 40 mg via SUBCUTANEOUS
  Filled 2017-08-10: qty 0.4

## 2017-08-10 MED ORDER — OXYCODONE-ACETAMINOPHEN 5-325 MG PO TABS
1.0000 | ORAL_TABLET | Freq: Three times a day (TID) | ORAL | Status: DC | PRN
  Administered 2017-08-10 – 2017-08-11 (×2): 1 via ORAL
  Filled 2017-08-10 (×2): qty 1

## 2017-08-10 MED ORDER — NYSTATIN 100000 UNIT/ML MT SUSP
5.0000 mL | Freq: Four times a day (QID) | OROMUCOSAL | Status: DC
  Administered 2017-08-10 – 2017-08-11 (×6): 500000 [IU] via ORAL
  Filled 2017-08-10 (×5): qty 5

## 2017-08-10 NOTE — Consult Note (Signed)
Consultation Note Date: 08/10/2017   Patient Name: Brandy Cameron  DOB: 1935-06-27  MRN: [id number]  Age / Sex: 82 y.o., female  PCP: [name], MD Referring Physician: Florencia Reasons, MD  Reason for Consultation: Inpatient hospice referral  HPI/Patient Profile: 82 y.o. female admitted on 08/09/2017    Clinical Assessment and Goals of Care:  82 year old lady with metastatic non-small cell lung cancer, metastatic burden to brain and adrenals, also with underlying asthma COPD stroke hypertension coronary artery disease, admitted to the hospital with increasing symptom burden, nausea, vomiting, diminishing oral intake, cough, shortness of breath, generalized pain.  Palliative consultation for assistance with transition to hospice environment Patient is elderly lady resting in bed. She complains of pain in her sides. She complains of shortness of breath. She states she is tired. She states she just wants to be kept comfortable and states she is not afraid of dying. Call placed and discussed in detail with daughter [name]. She describes about the patient's ongoing functional decline. Goals above everything else are for establishment of an maintenance of comfort measures only. We discussed about hospice philosophy of care, residential hospice in detail. Choices discussed. See additional discussions below. Social work consulted. Medication history reviewed. Continue current medications.  NEXT OF KIN  daughter Hilda Blades  SUMMARY OF RECOMMENDATIONS    DNR DNI Residential hospice- either in [place] or in [place], [place] CSW consult to help facilitate, discussed with daughter Hilda Blades Continue current comfort measures  Code Status/Advance Care Planning:  DNR    Symptom Management:      Palliative Prophylaxis:   Delirium Protocol  Additional Recommendations (Limitations, Scope, Preferences):  Full  Comfort Care  Psycho-social/Spiritual:   Desire for further Chaplaincy support:yes  Additional Recommendations: Education on Hospice  Prognosis:   < 2 weeks  Discharge Planning: Hospice facility      Primary Diagnoses: Present on Admission: . Lung cancer metastatic to brain ([place]) . Hypertension . COPD (chronic obstructive pulmonary disease) ([place]) . Intractable vomiting with nausea . Metastatic cancer ([place])   I have reviewed the medical record, interviewed the patient and family, and examined the patient. The following aspects are pertinent.  Past Medical History:  Diagnosis Date  . Asthma   . CAD (coronary artery disease)    a. LHC 9/11 (in setting of NSTEMI): LAD irregs, mLCx 50, pRCA 50, inf-apical AK;  b. Myoview 9/13: Normal stress nuclear study.  LV Ejection Fraction: 71%   . Carotid stenosis    a. Carotid US 9/13: bilat 0-39%;  b. Carotid US 7/15: no sig stenosis  . Chronic diastolic CHF (congestive heart failure) ([place])    a. Echo 7/15: Mild LVH, mild focal basal septal hypertrophy, EF 50-55%, no RWMA, Gr 1 DD  . COPD (chronic obstructive pulmonary disease) ([place])   . HTN (hypertension)   . Lung cancer [facility])    [name]  Marland Kitchen NSTEMI (non-ST elevated myocardial infarction) ([place])  with only mild to moderate CAD in September of 2011,There was concern for apival ballooning. >> ? Tako-Tsubo syndrome vs vasospasm   Social History   Socioeconomic History  . Marital status: Divorced    Spouse name: Not on file  . Number of children: Not on file  . Years of education: Not on file  . Highest education level: Not on file  Occupational History  . Not on file  Social Needs  . Financial resource strain: Not on file  . Food insecurity:    Worry: Not on file    Inability: Not on file  . Transportation needs:    Medical: Not on file    Non-medical: Not on file  Tobacco Use  . Smoking  status: Former Smoker    Packs/day: 1.50    Years: 42.00    Pack years: 63.00    Types: Cigarettes    Last attempt to quit: 04/26/2006    Years since quitting: 11.2  . Smokeless tobacco: Never Used  Substance and Sexual Activity  . Alcohol use: No    Alcohol/week: 0.0 oz  . Drug use: No  . Sexual activity: Never  Lifestyle  . Physical activity:    Days per week: Not on file    Minutes per session: Not on file  . Stress: Not on file  Relationships  . Social connections:    Talks on phone: Not on file    Gets together: Not on file    Attends religious service: Not on file    Active member of club or organization: Not on file    Attends meetings of clubs or organizations: Not on file    Relationship status: Not on file  Other Topics Concern  . Not on file  Social History Narrative   Originally from [place]. Always lived in [place]. Prior travel to [facility]. 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 Has a dog at home. No birds, mold, or hot tub exposure. No indoor plants. No carpet in her home. Does have draperies.    Family History  Problem Relation Age of Onset  . Heart disease Mother 45  . Asthma Mother   . Diabetes Mother   . Cancer Mother        Salivary  . Emphysema Mother   . Heart disease Unknown        POSITIVE FAMILY HX. OF   . Pancreatitis Unknown        QUESTIONABLE FROM ACE INHIBITORS  . COPD Sister   . Dementia Brother   . Stroke Brother    Scheduled Meds: . dexamethasone  4 mg Oral Q12H  . enoxaparin (LOVENOX) injection  40 mg Subcutaneous Q24H  . feeding supplement (ENSURE ENLIVE)  237 mL Oral BID BM  . fluconazole  100 mg Oral Daily  . insulin aspart  0-9 Units Subcutaneous TID WC  . levETIRAcetam  500 mg Oral BID  . metoprolol tartrate  25 mg Oral BID  . mometasone-formoterol  2 puff Inhalation BID  . nystatin  5 mL Oral QID   Continuous Infusions: PRN Meds:.albuterol, fentaNYL (SUBLIMAZE) injection, nitroGLYCERIN, ondansetron  **OR** ondansetron (ZOFRAN) IV, oxyCODONE-acetaminophen Medications Prior to Admission:  Prior to Admission medications   Medication Sig Start Date End Date Taking? Authorizing Provider  aluminum hydroxide-magnesium carbonate (GAVISCON) 95-358 MG/15ML SUSP Take 15 mLs by mouth 2 (two) times daily as needed for indigestion or heartburn.    Yes [provider]  aspirin EC 81  MG tablet Take 81 mg by mouth daily.   Yes [provider]  budesonide-formoterol (SYMBICORT) 160-4.5 MCG/ACT inhaler Inhale 2 puffs into the lungs 2 (two) times daily as needed (sob and wheezing).   Yes [provider]  dexamethasone (DECADRON) 4 MG tablet Take 1 tablet (4 mg total) by mouth 4 (four) times daily. Patient taking differently: Take 4 mg by mouth 2 (two) times daily.  07/23/17  Yes Donne Hazel, MD  donepezil (ARICEPT) 5 MG tablet Take 1/2 tablet daily for 2 weeks, then increase to 1 tablet daily Patient taking differently: Take 2.5-5 mg by mouth See admin instructions. Start date 07/07/17: Take 1/2 tablet (2.5 mg) daily at bedtime for 2 weeks, then increase to 1 tablet (5 mg) daily at bedtime 07/07/17  Yes Cameron Sprang, MD  fluconazole (DIFLUCAN) 100 MG tablet Take 1 tablet (100 mg total) by mouth daily. 08/03/17  Yes Curcio, Roselie Awkward, NP  furosemide (LASIX) 40 MG tablet Take 2 tablets (80 mg total) by mouth daily. Patient taking differently: Take 40 mg by mouth 2 (two) times daily.  07/13/17  Yes Lelon Perla, MD  levETIRAcetam (KEPPRA) 500 MG tablet Take 1 tablet (500 mg total) by mouth 2 (two) times daily. 07/23/17  Yes Donne Hazel, MD  losartan (COZAAR) 100 MG tablet Take 1 tablet (100 mg total) by mouth daily. 07/12/17  Yes [name], MD  metoprolol tartrate (LOPRESSOR) 25 MG tablet Take 1 tablet (25 mg total) by mouth 2 (two) times daily. 07/12/17  Yes [name], MD  omeprazole (PRILOSEC) 20 MG capsule Take 1 capsule (20 mg total) by mouth daily. 08/03/17  Yes  Curcio, Roselie Awkward, NP  OXYGEN Inhale 2 L into the lungs continuous.    Yes [provider]  potassium chloride SA (K-DUR,KLOR-CON) 20 MEQ tablet Take 20 mEq by mouth daily.   Yes [provider]  nitroGLYCERIN (NITROSTAT) 0.4 MG SL tablet Place 0.4 mg under the tongue every 5 (five) minutes as needed for chest pain.     [provider]  Spacer/Aero-Holding Chambers (AEROCHAMBER MV) inhaler Use as instructed 08/29/15   [name], MD   Allergies  Allergen Reactions  . Augmentin [Amoxicillin-Pot Clavulanate] Diarrhea    Has patient had a PCN reaction causing immediate rash, facial/tongue/throat swelling, SOB or lightheadedness with hypotension: No Has patient had a PCN reaction causing severe rash involving mucus membranes or skin necrosis: No Has patient had a PCN reaction that required hospitalization: No Has patient had a PCN reaction occurring within the last 10 years: No If all of the above answers are "NO", then may proceed with Cephalosporin use.   . Sulfa Antibiotics Rash and Shortness Of Breath  . Tape Other (See Comments)  . Amlodipine Other (See Comments)    Unknown reaction  . Atorvastatin Other (See Comments)    Leg pain/weakness  . Pantoprazole Nausea And Vomiting  . Sulfonamide Derivatives Rash   Review of Systems + weakness + pain in her "sides"  Physical Exam Weak elderly lady resting in bed Shallow clear breath sounds Pain bilateral flank areas right worse than left Abdomen soft and non distended Has trace edema Awake, alert S1-S2  Vital Signs: BP (!) 146/52 (BP Location: Right Arm)   Pulse 81   Temp 97.6 F (36.4 C) (Oral)   Resp 16   Ht 5\' 1"  (1.549 m)   Wt 83.4 kg (183 lb 13.8 oz)   SpO2 94%  BMI 34.74 kg/m  Pain Scale: 0-10 POSS *See Group Information*: S-Acceptable,Sleep, easy to arouse Pain Score: Asleep   SpO2: SpO2: 94 % O2 Device:SpO2: 94 % O2 Flow Rate: .O2 Flow Rate (L/min): 2 L/min  IO: Intake/output  summary:   Intake/Output Summary (Last 24 hours) at 08/10/2017 1508 Last data filed at 08/10/2017 0936 Gross per 24 hour  Intake 1040 ml  Output -  Net 1040 ml    LBM: Last BM Date: 08/09/17 Baseline Weight: Weight: 88 kg (194 lb) Most recent weight: Weight: 83.4 kg (183 lb 13.8 oz)     Palliative Assessment/Data:   PPS 20-30%  Time In:  1400 Time Out:  1500 Time Total:  60 min  Greater than 50%  of this time was spent counseling and coordinating care related to the above assessment and plan.  Signed by: Loistine Chance, MD  [phone]  Please contact Palliative Medicine Team phone at [phone] for questions and concerns.  For individual provider: See Shea Evans

## 2017-08-10 NOTE — Progress Notes (Signed)
This chaplain familiar with Jode from previous admission.  Rounded on patient today to assess for follow up support needs, given other provider's notes indicating pt was pursuing palliative measures.  Pt asleep at chaplain arrival, no family present at bedside.  Will continue to follow in order to provide support for this patient and family through this hospitalization.    [facility] / [facility] Chaplain Jerene Pitch, MDiv, [facility]

## 2017-08-10 NOTE — Progress Notes (Signed)
PROGRESS NOTE  Brandy Cameron JKK:[id number] DOB: [birth year] DOA: 08/09/2017 PCP: [name], MD  HPI/Recap of past 24 hours:  No diarrhea this am, patient denies pain She is not able to provide detailed history  Assessment/Plan: Active Problems:   Hypertension   COPD (chronic obstructive pulmonary disease) (HCC)   Intractable vomiting with nausea   Metastatic cancer ([place])   Encounter for palliative care   Lung cancer metastatic to brain [facility])  FTT;  Patient is brought to the hospital due to "Pt is unable to get up and be mobile. Pt is not eating. She can not get up from using the bathroom"  Lung cancer with metastasis-brain and adrenal glands-follows with Dr. Julien Nordmann.  Patient has agreed to palliative/hospice care. Recent admissions for seizures. -Continue home Keppra - Fentanyl  For pain for now with renal impairment  - Cont home decadron 4mg  BID, to start 4 mg daily 4/19. -Palliative care consulted, likely will need residential hospice   AKI: she received ivf on admission  Oral thrush: she is started on diflucan, add topical nystatin    Code Status: DNR  Family Communication: patient   Disposition Plan: pending palliative care input   Consultants:  Palliative care  Procedures:  none  Antibiotics:  none   Objective: BP (!) 123/51 (BP Location: Right Arm)   Pulse 60   Temp 98.2 F (36.8 C) (Oral)   Resp (!) 22   Ht 5\' 1"  (1.549 m)   Wt 83.4 kg (183 lb 13.8 oz)   SpO2 98%   BMI 34.74 kg/m   Intake/Output Summary (Last 24 hours) at 08/10/2017 1945 Last data filed at 08/10/2017 0936 Gross per 24 hour  Intake 1040 ml  Output -  Net 1040 ml   Filed Weights   08/09/17 1720 08/09/17 2037  Weight: 88 kg (194 lb) 83.4 kg (183 lb 13.8 oz)    Exam: Patient is examined daily including today on 08/10/2017, exams remain the same as of yesterday except that has changed    General:  Frail, weak, chronically ill , alopecia  Cardiovascular:  RRR  Respiratory: CTABL  Abdomen: Soft/ND/NT, positive BS  Musculoskeletal: No Edema  Neuro: alert, orientedx3, poor memory   Data Reviewed: Basic Metabolic Panel: Recent Labs  Lab 08/09/17 1747 08/10/17 0550  NA 138 140  K 5.9* 5.1  CL 97* 102  CO2 27 28  GLUCOSE 201* 168*  BUN 93* 91*  CREATININE 1.51* 1.24*  CALCIUM 9.8 9.3   Liver Function Tests: No results for input(s): AST, ALT, ALKPHOS, BILITOT, PROT, ALBUMIN in the last 168 hours. No results for input(s): LIPASE, AMYLASE in the last 168 hours. No results for input(s): AMMONIA in the last 168 hours. CBC: Recent Labs  Lab 08/09/17 1747 08/10/17 0550  WBC 30.6* 21.0*  NEUTROABS 28.2*  --   HGB 16.9* 15.0  HCT 53.8* 49.1*  MCV 92.1 92.5  PLT 264 202   Cardiac Enzymes:   No results for input(s): CKTOTAL, CKMB, CKMBINDEX, TROPONINI in the last 168 hours. BNP (last 3 results) No results for input(s): BNP in the last 8760 hours.  ProBNP (last 3 results) No results for input(s): PROBNP in the last 8760 hours.  CBG: Recent Labs  Lab 08/09/17 1736 08/10/17 1153 08/10/17 1620  GLUCAP 192* 182* 159*    No results found for this or any previous visit (from the past 240 hour(s)).   Studies: Dg Chest 1 View  Result Date: 08/09/2017 CLINICAL DATA:  82  y/o  F; cough and weakness. EXAM: CHEST  1 VIEW COMPARISON:  07/13/2017 PET-CT. 06/28/2017 chest CT. 06/27/2017 chest radiograph. FINDINGS: Stable normal cardiac silhouette given projection and technique. Postsurgical changes in the left hilum. Ill-defined left suprahilar soft tissue likely corresponding mass on prior PET CT. Small left effusion. No new consolidation or pneumothorax identified. Bones are unremarkable. IMPRESSION: Ill-defined left hilar opacification likely corresponding to mass on prior PET-CT. Small left effusion. Electronically Signed   By: Kristine Garbe M.D.   On: 08/09/2017 21:10    Scheduled Meds: . dexamethasone  4 mg Oral Q12H   . enoxaparin (LOVENOX) injection  40 mg Subcutaneous Q24H  . feeding supplement (ENSURE ENLIVE)  237 mL Oral BID BM  . fluconazole  100 mg Oral Daily  . insulin aspart  0-9 Units Subcutaneous TID WC  . levETIRAcetam  500 mg Oral BID  . metoprolol tartrate  25 mg Oral BID  . mometasone-formoterol  2 puff Inhalation BID  . nystatin  5 mL Oral QID    Continuous Infusions:   Time spent: 79mins I have personally reviewed and interpreted on  08/10/2017 daily labs, tele strips, imagings as discussed above under date review session and assessment and plans.  I reviewed all nursing notes, pharmacy notes, consultant notes,  vitals, pertinent old records  I have discussed plan of care as described above with RN , patient on 08/10/2017   Florencia Reasons MD, PhD  [facility] Pager [phone]. If 7PM-7AM, please contact night-coverage at [email or web address], password [facility] 08/10/2017, 7:45 PM  LOS: 1 day

## 2017-08-11 DIAGNOSIS — C7931 Secondary malignant neoplasm of brain: Principal | ICD-10-CM

## 2017-08-11 DIAGNOSIS — J438 Other emphysema: Secondary | ICD-10-CM

## 2017-08-11 DIAGNOSIS — C3490 Malignant neoplasm of unspecified part of unspecified bronchus or lung: Secondary | ICD-10-CM

## 2017-08-11 LAB — URINALYSIS, ROUTINE W REFLEX MICROSCOPIC
Bilirubin Urine: NEGATIVE
Glucose, UA: NEGATIVE mg/dL
KETONES UR: NEGATIVE mg/dL
Leukocytes, UA: NEGATIVE
Nitrite: NEGATIVE
PH: 5 (ref 5.0–8.0)
Protein, ur: 30 mg/dL — AB
SPECIFIC GRAVITY, URINE: 1.019 (ref 1.005–1.030)

## 2017-08-11 LAB — GLUCOSE, CAPILLARY
Glucose-Capillary: 175 mg/dL — ABNORMAL HIGH (ref 65–99)
Glucose-Capillary: 181 mg/dL — ABNORMAL HIGH (ref 65–99)

## 2017-08-11 MED ORDER — DEXAMETHASONE 4 MG PO TABS: 4.0000 mg | ORAL_TABLET | Freq: Every day | ORAL | 0 refills | Status: AC

## 2017-08-11 MED ORDER — NYSTATIN 100000 UNIT/ML MT SUSP: 5.0000 mL | Freq: Four times a day (QID) | OROMUCOSAL | 0 refills | Status: AC

## 2017-08-11 NOTE — Progress Notes (Signed)
Patient going to [facility].   Patient will transport by [facility].   LCSW faxed dc docs.   LCSW notified family of transport.   RN report number: [phone]  Servando Snare, [place] [phone]

## 2017-08-11 NOTE — Progress Notes (Addendum)
Patient has discharged to residential hospice on 08/11/17. SW is notified. Rn called to give a report at 86 to Rn at [facility]. No question at this time

## 2017-08-11 NOTE — Clinical Social Work Note (Signed)
Clinical Social Work Assessment  Patient Details  Name: Brandy Cameron MRN: [id number] Date of Birth: [birth year]  Date of referral:  08/11/17               Reason for consult:  Facility Placement                Permission sought to share information with:  Case Manager, Customer service manager, Family Supports Permission granted to share information::  No(Patient not oriented)  Name::     Advertising copywriter::  [facility]  Relationship::  Daughter  Contact Information:     Housing/Transportation Living arrangements for the past 2 months:  Single Family Home Source of Information:  Adult Children Patient Interpreter Needed:  None Criminal Activity/Legal Involvement Pertinent to Current Situation/Hospitalization:  No - Comment as needed Significant Relationships:  Adult Children Lives with:  Adult Children Do you feel safe going back to the place where you live?  Yes Need for family participation in patient care:  Yes (Comment)  Care giving concerns:  Patient lives with adult daughter, Brandy Cameron. Daughter works fulltime and reports she is not able to care for patient 24/7.  Social Worker assessment / plan:  LCSW consulted for residential hospice placement.   Patient admitted for lung cancer.  Patient is not oriented. LCSW spoke with patient's daughter, Brandy Cameron by phone. LCSW explained role and reason for phone call.   Patient's daughter is agreeable to residential hospice. Brandy Cameron is open to any local facility with a bed.   PLAN: Patient will go to residential hospice at dc.   Employment status:  Retired Nurse, adult PT Recommendations:  Not assessed at this time Information / Referral to community resources:     Patient/Family's Response to care:  Patient's daughter, Brandy Cameron appears overwhelmed with patients current situation. Patient's daughter thankful for LCSW resources.   Patient/Family's Understanding of and Emotional Response to  Diagnosis, Current Treatment, and Prognosis:  Family is understanding or patients prognosis. Daughter appears to be overwhelmed with all the decisions that she has to make at this time. LCSW acknowledged daughters concerns.   Emotional Assessment Appearance:  Appears stated age Attitude/Demeanor/Rapport:  Unable to Assess Affect (typically observed):  Unable to Assess Orientation:  Oriented to Self Alcohol / Substance use:  Not Applicable Psych involvement (Current and /or in the community):  No (Comment)  Discharge Needs  Concerns to be addressed:    Readmission within the last 30 days:  Yes Current discharge risk:  None Barriers to Discharge:  No Barriers Identified   Brandy Snare, LCSW 08/11/2017, 2:00 PM

## 2017-08-11 NOTE — Discharge Summary (Signed)
Discharge Summary  Brandy Cameron FOY:[id number] DOB: 1936-01-17  PCP: [name], MD  Admit date: 08/09/2017 Discharge date: 08/11/2017  Time spent: 35mins, more than 50% time spent on coordination of care  Patient is discharged to residential hospice.  Discharge Diagnoses:  Active Hospital Problems   Diagnosis Date Noted  . Lung cancer metastatic to brain ([place]) 08/09/2017  . Encounter for palliative care 08/03/2017  . Metastatic cancer ([place])   . Intractable vomiting with nausea 06/13/2017  . COPD (chronic obstructive pulmonary disease) ([place]) 08/29/2015  . Hypertension 11/11/2010    Resolved Hospital Problems  No resolved problems to display.    Discharge Condition: stable  Diet recommendation: diet as tolerated  Filed Weights   08/09/17 1720 08/09/17 2037  Weight: 88 kg (194 lb) 83.4 kg (183 lb 13.8 oz)    History of present illness: (per admitting MD Dr Denton Brick) PCP: [name], MD   Patient coming from: home  Chief Complaint: Weakness, Diarrhea, vomiting.  HPI: Brandy Cameron is a 82 y.o. female with medical history significant for Metastatic Lung Non-small cell cancer- Brain and abdomen-adrenal, Asthma, COPD, CVA, HTN, CAD, who was brought to the ED by family with reports 2 episodes of loose stools of 3 days duration, nausea vomiting for several weeks, and reduced p.o. Intake.  Patient also reports increased productive cough-whitish creamy sputum, unchanged shortness of breath-on chronic 2 L O2.  Patient denies fever or chills.  Over the past 2-3 days patient has not been able to get up from the commode requiring assistance from family.  Today she tried to get up and slid towards the floor on her butt.  She endorses generalized body aches chronic from metastatic cancer.  She Is able to move her legs, denies specific hip pain.  Has a history of recurrent non-small cell lung cancer diagnosed 2010 now with metastases brain and abdomen, s/p left upper  lobe wedge resection with seed implants, completed whole brain radiation.  Follows with Dr. Earlie Server.  She has been getting palliative radiation treatments last treatment, 08/02/16.  Last visit 4/10-options going forward included palliative care/hospice versus treatment with immunotherapy.  Patient's today is obtained to stop all treatment, and proceed with palliative care.  Unfortunately, there are no hospice beds available [place] or [place].  Patient is requiring more care which her daughter and significant other not able to provide as they have to go to work. Patient tells me today that she is tired, and does not want any more treatment.  ED Course: Elevated blood pressure systolic 767M down to 094B, tachypnea.  Creatinine elevated 1.51, WBC elevated at 30.6.  K elevated 5.9.  CBG was 201.  EKG no significant change from prior.  1 L bolus given in ED. Hospitalist called to admit for failure to thrive, AKI.     Hospital Course:  Active Problems:   Hypertension   COPD (chronic obstructive pulmonary disease) (H[facility])   Intractable vomiting with nausea   Metastatic cancer ([place])   Encounter for palliative care   Lung cancer metastatic to brain [facility])   FTT;  Patient is brought to the hospital due to "Pt is unable to get up and be mobile. Pt is not eating. She can not get up from using the bathroom" She is to discharge to residential hospice  Lung cancer with metastasis-brain and adrenal glands-follows with Dr. Julien Nordmann.Patient has agreed to palliative/hospice care. Recentadmissions for seizures. -Continue home Keppra - Fentanyl For pain for now with renal impairment  -  she received decadron 4mg  BID in the hospital, to start 4 mg daily 4/19., taper off as tolerated.  -Palliative care consulted, residential hospice placement.  AKI on CKDII:  Cr improved with  ivf She is to discharge to residential hospice  Oral thrush: she is started on diflucan, add topical nystatin  HTN:  home meds losartan/lasix discontinued. She is continued on metoprolol. She is to discharge to residential hospice  Elevated blood glucose: No prior history of diabetes Elevated blood glucose  likely steroids induced, she received sliding scale insulin in the hospital. She is discharged on decadron taper.    Code Status: DNR  Family Communication: patient   Disposition Plan: residential hospice   Consultants:  Palliative care  Procedures:  none  Antibiotics:  none   Discharge Exam: BP 131/65 (BP Location: Left Arm)   Pulse 80   Temp (!) 97.5 F (36.4 C) (Oral)   Resp 18   Ht 5\' 1"  (1.549 m)   Wt 83.4 kg (183 lb 13.8 oz)   SpO2 97%   BMI 34.74 kg/m    General:  Frail, weak, chronically ill , alopecia  Cardiovascular: RRR  Respiratory: CTABL  Abdomen: Soft/ND/NT, positive BS  Musculoskeletal: No Edema  Neuro: alert, orientedx3, poor memory   Discharge Instructions You were cared for by a hospitalist during your hospital stay. If you have any questions about your discharge medications or the care you received while you were in the hospital after you are discharged, you can call the unit and asked to speak with the hospitalist on call if the hospitalist that took care of you is not available. Once you are discharged, your primary care physician will handle any further medical issues. Please note that NO REFILLS for any discharge medications will be authorized once you are discharged, as it is imperative that you return to your primary care physician (or establish a relationship with a primary care physician if you do not have one) for your aftercare needs so that they can reassess your need for medications and monitor your lab values.  Discharge Instructions    Diet general   Complete by:  As directed    Increase activity slowly   Complete by:  As directed      Allergies as of 08/11/2017      Reactions   Augmentin [amoxicillin-pot Clavulanate]  Diarrhea   Has patient had a PCN reaction causing immediate rash, facial/tongue/throat swelling, SOB or lightheadedness with hypotension: No Has patient had a PCN reaction causing severe rash involving mucus membranes or skin necrosis: No Has patient had a PCN reaction that required hospitalization: No Has patient had a PCN reaction occurring within the last 10 years: No If all of the above answers are "NO", then may proceed with Cephalosporin use.   Sulfa Antibiotics Rash, Shortness Of Breath   Tape Other (See Comments)   Amlodipine Other (See Comments)   Unknown reaction   Atorvastatin Other (See Comments)   Leg pain/weakness   Pantoprazole Nausea And Vomiting   Sulfonamide Derivatives Rash      Medication List    STOP taking these medications   aluminum hydroxide-magnesium carbonate 95-358 MG/15ML Susp Commonly known as:  GAVISCON   aspirin EC 81 MG tablet   donepezil 5 MG tablet Commonly known as:  ARICEPT   furosemide 40 MG tablet Commonly known as:  LASIX   losartan 100 MG tablet Commonly known as:  COZAAR   omeprazole 20 MG capsule Commonly known as:  PRILOSEC  potassium chloride SA 20 MEQ tablet Commonly known as:  K-DUR,KLOR-CON     TAKE these medications   AEROCHAMBER MV inhaler Use as instructed   budesonide-formoterol 160-4.5 MCG/ACT inhaler Commonly known as:  SYMBICORT Inhale 2 puffs into the lungs 2 (two) times daily as needed (sob and wheezing).   dexamethasone 4 MG tablet Commonly known as:  DECADRON Take 1 tablet (4 mg total) by mouth daily. What changed:  when to take this   fluconazole 100 MG tablet Commonly known as:  DIFLUCAN Take 1 tablet (100 mg total) by mouth daily.   levETIRAcetam 500 MG tablet Commonly known as:  KEPPRA Take 1 tablet (500 mg total) by mouth 2 (two) times daily.   metoprolol tartrate 25 MG tablet Commonly known as:  LOPRESSOR Take 1 tablet (25 mg total) by mouth 2 (two) times daily.   nitroGLYCERIN 0.4 MG SL  tablet Commonly known as:  NITROSTAT Place 0.4 mg under the tongue every 5 (five) minutes as needed for chest pain.   nystatin 100000 UNIT/ML suspension Commonly known as:  MYCOSTATIN Take 5 mLs (500,000 Units total) by mouth 4 (four) times daily.   OXYGEN Inhale 2 L into the lungs continuous.      Allergies  Allergen Reactions  . Augmentin [Amoxicillin-Pot Clavulanate] Diarrhea    Has patient had a PCN reaction causing immediate rash, facial/tongue/throat swelling, SOB or lightheadedness with hypotension: No Has patient had a PCN reaction causing severe rash involving mucus membranes or skin necrosis: No Has patient had a PCN reaction that required hospitalization: No Has patient had a PCN reaction occurring within the last 10 years: No If all of the above answers are "NO", then may proceed with Cephalosporin use.   . Sulfa Antibiotics Rash and Shortness Of Breath  . Tape Other (See Comments)  . Amlodipine Other (See Comments)    Unknown reaction  . Atorvastatin Other (See Comments)    Leg pain/weakness  . Pantoprazole Nausea And Vomiting  . Sulfonamide Derivatives Rash      The results of significant diagnostics from this hospitalization (including imaging, microbiology, ancillary and laboratory) are listed below for reference.    Significant Diagnostic Studies: Dg Chest 1 View  Result Date: 08/09/2017 CLINICAL DATA:  82 y/o  F; cough and weakness. EXAM: CHEST  1 VIEW COMPARISON:  07/13/2017 PET-CT. 06/28/2017 chest CT. 06/27/2017 chest radiograph. FINDINGS: Stable normal cardiac silhouette given projection and technique. Postsurgical changes in the left hilum. Ill-defined left suprahilar soft tissue likely corresponding mass on prior PET CT. Small left effusion. No new consolidation or pneumothorax identified. Bones are unremarkable. IMPRESSION: Ill-defined left hilar opacification likely corresponding to mass on prior PET-CT. Small left effusion. Electronically Signed   By:  Kristine Garbe M.D.   On: 08/09/2017 21:10   Ct Head Wo Contrast  Result Date: 07/18/2017 CLINICAL DATA:  Garbled speech and abnormal gait EXAM: CT HEAD WITHOUT CONTRAST TECHNIQUE: Contiguous axial images were obtained from the base of the skull through the vertex without intravenous contrast. COMPARISON:  CT brain 06/13/2017 FINDINGS: Brain: No hemorrhage is visualized. Increased hypodensity within the right greater than left parietal white matter consistent with edema. 9 mm suspected hypodense mass with central focus of hemorrhage or calcification suspicious for metastatic disease. Edema in the right occipital lobe. Moderate atrophy. Stable ventricle size. Increased hypodensity in the right thalamus compared to prior. Possible hypodense area of edema within the left cerebellar peduncle. Fourth ventricle is patent. Basilar cisterns patent. Vascular: No hyperdense vessels.  Carotid vascular calcification Skull: No fracture. Sinuses/Orbits: No acute finding. Other: None IMPRESSION: 1. Moderate hypodensity within the right greater than left parietal white matter with suspected 9 mm hypodense mass in the right white matter with punctate central focus of hemorrhage or calcification, concerning for metastatic disease. Edema also present in the right occipital lobe and is suspected within the left cerebellum and right thalamus. Recommend MRI of the brain with and without contrast. 2. Atrophy and mild small vessel ischemic changes of the white matter. Electronically Signed   By: Donavan Foil M.D.   On: 07/18/2017 18:35   Mr Brain Wo Contrast  Result Date: 07/18/2017 CLINICAL DATA:  Speech difficulty EXAM: MRI HEAD WITHOUT CONTRAST TECHNIQUE: Multiplanar, multiecho pulse sequences of the brain and surrounding structures were obtained without intravenous contrast. COMPARISON:  None. FINDINGS: The examination had to be discontinued prior to completion due to patient request to terminate the study early.  Axial and coronal diffusion-weighted imaging, corresponding ADC maps, sagittal T1-weighted imaging and axial T2-weighted imaging were acquired. No contrast was administered. Brain: There are numerous lesions throughout the brain that show peripheral diffusion restriction. There is no central diffusion restriction to suggest abscess. These are most consistent with metastases. The largest lesion is in the right parietal white matter measures 2.0 x 1.9 cm. There are at least 20 lesions. Edema is worst surrounding right parietal and left occipital lesions. No midline shift. No hydrocephalus. Vascular: Major intracranial arterial and venous sinus flow voids are preserved. Skull and upper cervical spine: Normal marrow signal. Sinuses/Orbits: Paranasal sinuses are clear. No mastoid or middle ear effusion. Bilateral lens replacements. Other: None. IMPRESSION: 1. Truncated examination. No intravenous contrast material was administered. 2. Numerous (at least 20) metastases throughout the brain, most of which are centrally cystic or necrotic. 3. Moderate edema surrounding the right parietal and left occipital largest lesions. No associated midline shift or hydrocephalus. Electronically Signed   By: Ulyses Jarred M.D.   On: 07/18/2017 22:53   Nm Pet Image Restag (ps) Skull Base To Thigh  Result Date: 07/13/2017 CLINICAL DATA:  Subsequent treatment strategy for left upper lobe lung cancer. EXAM: NUCLEAR MEDICINE PET SKULL BASE TO THIGH TECHNIQUE: 10.7 mCi F-18 FDG was injected intravenously. Full-ring PET imaging was performed from the skull base to thigh after the radiotracer. CT data was obtained and used for attenuation correction and anatomic localization. Fasting blood glucose: 130 mg/dl Mediastinal blood pool activity: SUV max 3.3 COMPARISON:  11/11/2015 PET-CT, and chest CT from 06/28/2017 FINDINGS: NECK: Activity in neck musculature and along the pharynx is thought to likely be physiologic. Incidental CT findings:  none CHEST: Hypermetabolic left upper lobe suprahilar mass has notably enlarged compared to 11/11/2015, and is roughly stable to the exam from earlier this month, measuring approximately 4.5 by 2.9 cm with maximum SUV 28.8. Previously the maximum SUV in this vicinity was 11.8. A posterior left hilar lymph node measuring 10 mm in diameter has a maximum SUV of 11.4. An indistinct AP window lymph node measuring about 1.3 cm in short axis has a maximum SUV of 13.1. Postoperative and post therapy related findings are present in the left lung. There is a small amount of metabolic activity along the right apical pleuroparenchymal scarring, unchanged in size from 06/28/2017, maximum SUV 2.6 and previously 2.8 back on 11/11/2015. A chronically stable 6 mm right lower lobe nodule on image 41/8 is not appreciably hypermetabolic today. Incidental CT findings: Coronary, aortic arch, and branch vessel atherosclerotic vascular disease. ABDOMEN/PELVIS: Hypermetabolic  1.0 by 2.3 cm mass of the lateral limb left adrenal gland, maximum SUV 26.8. Hypermetabolic small nodule of the lateral limb right adrenal gland, maximum SUV 6.1. A hypermetabolic nodule within or along the body of the pancreas has a maximum SUV of 13.9 and measures 1.4 cm in diameter. An adjacent pancreatic body hypermetabolic focus is similarly sized and has a maximum SUV of 9.1. Small hypermetabolic left pelvic sidewall lymph nodes are present, measuring up to 8 mm in diameter and with maximum SUV 14.7 Physiologic uptake is present in bowel. There are several unusual intra-metastatic lesions including a small left upper quadrant omental focus with SUV 5.7 measuring up to 7 mm in diameter as well as 2 small lesions in the left perirenal space. Incidental CT findings: Aortoiliac atherosclerotic vascular disease. Sigmoid colon diverticulosis. SKELETON: Multiple musculoskeletal metastatic lesions are present, including a suspected lesion in the left acromion with  maximum SUV 30.7; a tiny nodule in the right lateral subcutaneous tissues of the thorax with maximum SUV 4.0; a lesion in the right gluteus medius muscle with maximum SUV 33.5; a lesion just posterior to the upper sacrum with maximum SUV 10.1; and small lesions along the left sciatic notch. Questionable lesion in the left iliac bone adjacent to the SI joint, maximum SUV 4.6. Incidental CT findings: none IMPRESSION: 1. Left upper lobe suprahilar hypermetabolic mass measuring about 4.5 by 2.9 cm, with associated metastatic disease to ipsilateral mediastinal lymph nodes; both adrenal glands; the pancreas; the left perirenal space; the left upper quadrant omentum; and several locations along muscular and subcutaneous tissues. Bony hypermetabolic lesions in the left acromion and perhaps the left iliac bone. 2. Other imaging findings of potential clinical significance: Aortic Atherosclerosis (ICD10-I70.0). Coronary atherosclerosis. Sigmoid colon diverticulosis. Electronically Signed   By: Van Clines M.D.   On: 07/13/2017 16:39   US Biopsy (abdominal Retropertioneal Mass)  Result Date: August 13, 2017 CLINICAL DATA:  Left upper lobe lung carcinoma with widespread metastatic disease including a soft tissue metastasis to the right gluteal musculature and additional soft tissue metastasis posterior to the sacrum. She presents for tissue biopsy of the gluteal soft tissue mass. EXAM: ULTRASOUND GUIDED CORE BIOPSY OF RIGHT GLUTEAL SOFT TISSUE MASS MEDICATIONS: 0.5 mg IV Versed; 25 mcg IV Fentanyl Total Moderate Sedation Time: 24 minutes. The patient's level of consciousness and physiologic status were continuously monitored during the procedure by Radiology nursing. PROCEDURE: The procedure, risks, benefits, and alternatives were explained to the patient. Questions regarding the procedure were encouraged and answered. The patient understands and consents to the procedure. A time out was performed prior to initiating the  procedure. The right gluteal region was prepped with chlorhexidine in a sterile fashion, and a sterile drape was applied covering the operative field. A sterile gown and sterile gloves were used for the procedure. Local anesthesia was provided with 1% Lidocaine. Initial ultrasound was performed of the right gluteal region from a posterior approach. The posterior sacral region was also imaged. Under ultrasound guidance, an 18 gauge core biopsy device was used in obtaining 4 separate core biopsy samples of the right gluteal soft tissue mass. Material was submitted in formalin. COMPLICATIONS: None. FINDINGS: The right gluteal soft tissue mass was well visualized by ultrasound and measured approximately 1.8 cm in maximal diameter. This appears as a hypoechoic well-circumscribed mass by ultrasound. A similar hypoechoic mass was seen in the midline soft tissues just superficial to the lower sacrum and measured approximately 1.9 cm in transverse diameter but was thinner in transverse  thickness and was decided to biopsy the gluteal soft tissue mass. Solid tissue was obtained from the gluteal soft tissue mass. There were no immediate complications. IMPRESSION: Ultrasound-guided core biopsy performed of a right gluteal soft tissue mass measuring approximately 1.8 cm in maximal diameter. Electronically Signed   By: Aletta Edouard M.D.   On: 08-08-17 11:23    Microbiology: No results found for this or any previous visit (from the past 240 hour(s)).   Labs: Basic Metabolic Panel: Recent Labs  Lab 08/09/17 1747 08/10/17 0550  NA 138 140  K 5.9* 5.1  CL 97* 102  CO2 27 28  GLUCOSE 201* 168*  BUN 93* 91*  CREATININE 1.51* 1.24*  CALCIUM 9.8 9.3   Liver Function Tests: No results for input(s): AST, ALT, ALKPHOS, BILITOT, PROT, ALBUMIN in the last 168 hours. No results for input(s): LIPASE, AMYLASE in the last 168 hours. No results for input(s): AMMONIA in the last 168 hours. CBC: Recent Labs  Lab  08/09/17 1747 08/10/17 0550  WBC 30.6* 21.0*  NEUTROABS 28.2*  --   HGB 16.9* 15.0  HCT 53.8* 49.1*  MCV 92.1 92.5  PLT 264 202   Cardiac Enzymes: No results for input(s): CKTOTAL, CKMB, CKMBINDEX, TROPONINI in the last 168 hours. BNP: BNP (last 3 results) No results for input(s): BNP in the last 8760 hours.  ProBNP (last 3 results) No results for input(s): PROBNP in the last 8760 hours.  CBG: Recent Labs  Lab 08/10/17 1153 08/10/17 1620 08/10/17 2116 08/11/17 0807 08/11/17 1116  GLUCAP 182* 159* 161* 175* 181*       Signed:  Florencia Reasons MD, PhD  [facility] 08/11/2017, 2:38 PM

## 2017-08-11 NOTE — Progress Notes (Signed)
Nutrition Brief Note  Patient screened for MST. Chart reviewed. Pt with PMH that includes HTN, COPD, metastatic NSCLC with brain, abdominal, and adrenal mets, CVA, CAD. She was brought to the ED for N/V/D for several weeks PTA. Placed on enteric precautions.  Palliative Care saw pt yesterday afternoon and plan is for d/c to residential hospice. Pt now transitioning to comfort care.   No further nutrition interventions warranted at this time. Please re-consult as needed.      Jarome Matin, MS, RD, LDN, [facility] Inpatient Clinical Dietitian Pager # [phone] After hours/weekend pager # [phone]

## 2017-08-11 NOTE — Care Management Note (Signed)
Case Management Note  Patient Details  Name: Brandy Cameron MRN: [id number] Date of Birth: [birth year]  Subjective/Objective:                    Action/Plan:Pt discharge plans are to [place]   Expected Discharge Date:  (unknown)               Expected Discharge Plan:  [place]  In-House Referral:  Clinical Social Work  Discharge planning Services  CM Consult  Post Acute Care Choice:    Choice offered to:     DME Arranged:    DME Agency:     HH Arranged:    [place] Agency:     Status of Service:  Completed, signed off  If discussed at [employer], dates discussed:    Additional CommentsPurcell Mouton, RN 08/11/2017, 11:19 AM

## 2017-08-18 ENCOUNTER — Telehealth: Payer: Self-pay | Admitting: Oncology

## 2017-08-18 NOTE — Telephone Encounter (Signed)
Patient called to cancel °

## 2017-08-22 ENCOUNTER — Inpatient Hospital Stay: Payer: Medicare Other

## 2017-08-22 ENCOUNTER — Inpatient Hospital Stay: Payer: Medicare Other | Admitting: Oncology

## 2017-08-24 NOTE — Telephone Encounter (Signed)
Tried calling pt to make hosp f/u appt w/Dr. Ronnald Ramp. No answer LMOM RTC.Marland KitchenJohny Chess

## 2017-08-24 NOTE — Telephone Encounter (Signed)
Pt daughter called with concerns regarding Biopsy results. MD advised he can see pt on Friday morning, however Daughter advised she can only bring pt to appointments after 4pm. Message to scheduling

## 2017-08-24 DEATH — deceased

## 2017-08-25 ENCOUNTER — Other Ambulatory Visit: Payer: Medicare Other

## 2017-08-25 ENCOUNTER — Ambulatory Visit: Payer: Medicare Other | Admitting: Oncology

## 2017-08-31 ENCOUNTER — Ambulatory Visit: Payer: Medicare Other | Admitting: Cardiology

## 2017-08-31 ENCOUNTER — Ambulatory Visit
Admission: RE | Admit: 2017-08-31 | Discharge: 2017-08-31 | Disposition: A | Discharge status: Home / Self Care | Payer: Medicare Other | Source: Ambulatory Visit | Attending: Urology | Admitting: Urology

## 2017-08-31 DIAGNOSIS — C7931 Secondary malignant neoplasm of brain: Secondary | ICD-10-CM

## 2017-09-09 ENCOUNTER — Other Ambulatory Visit: Payer: Self-pay | Admitting: Internal Medicine

## 2017-09-09 DIAGNOSIS — I10 Essential (primary) hypertension: Secondary | ICD-10-CM

## 2017-11-23 ENCOUNTER — Ambulatory Visit: Payer: Medicare Other | Admitting: Cardiology

## 2018-01-17 ENCOUNTER — Ambulatory Visit: Payer: Medicare Other | Admitting: Neurology

## 2018-08-26 IMAGING — DX DG CHEST 1V
1 series · 1 of 1 positions shown · non-contrast
Comparison: 07/13/2017 PET-CT. 06/28/2017 chest CT. 06/27/2017
chest radiograph.

CLINICAL DATA: 82 y/o  F; cough and weakness.

EXAM:
CHEST  1 VIEW

[chest ap]
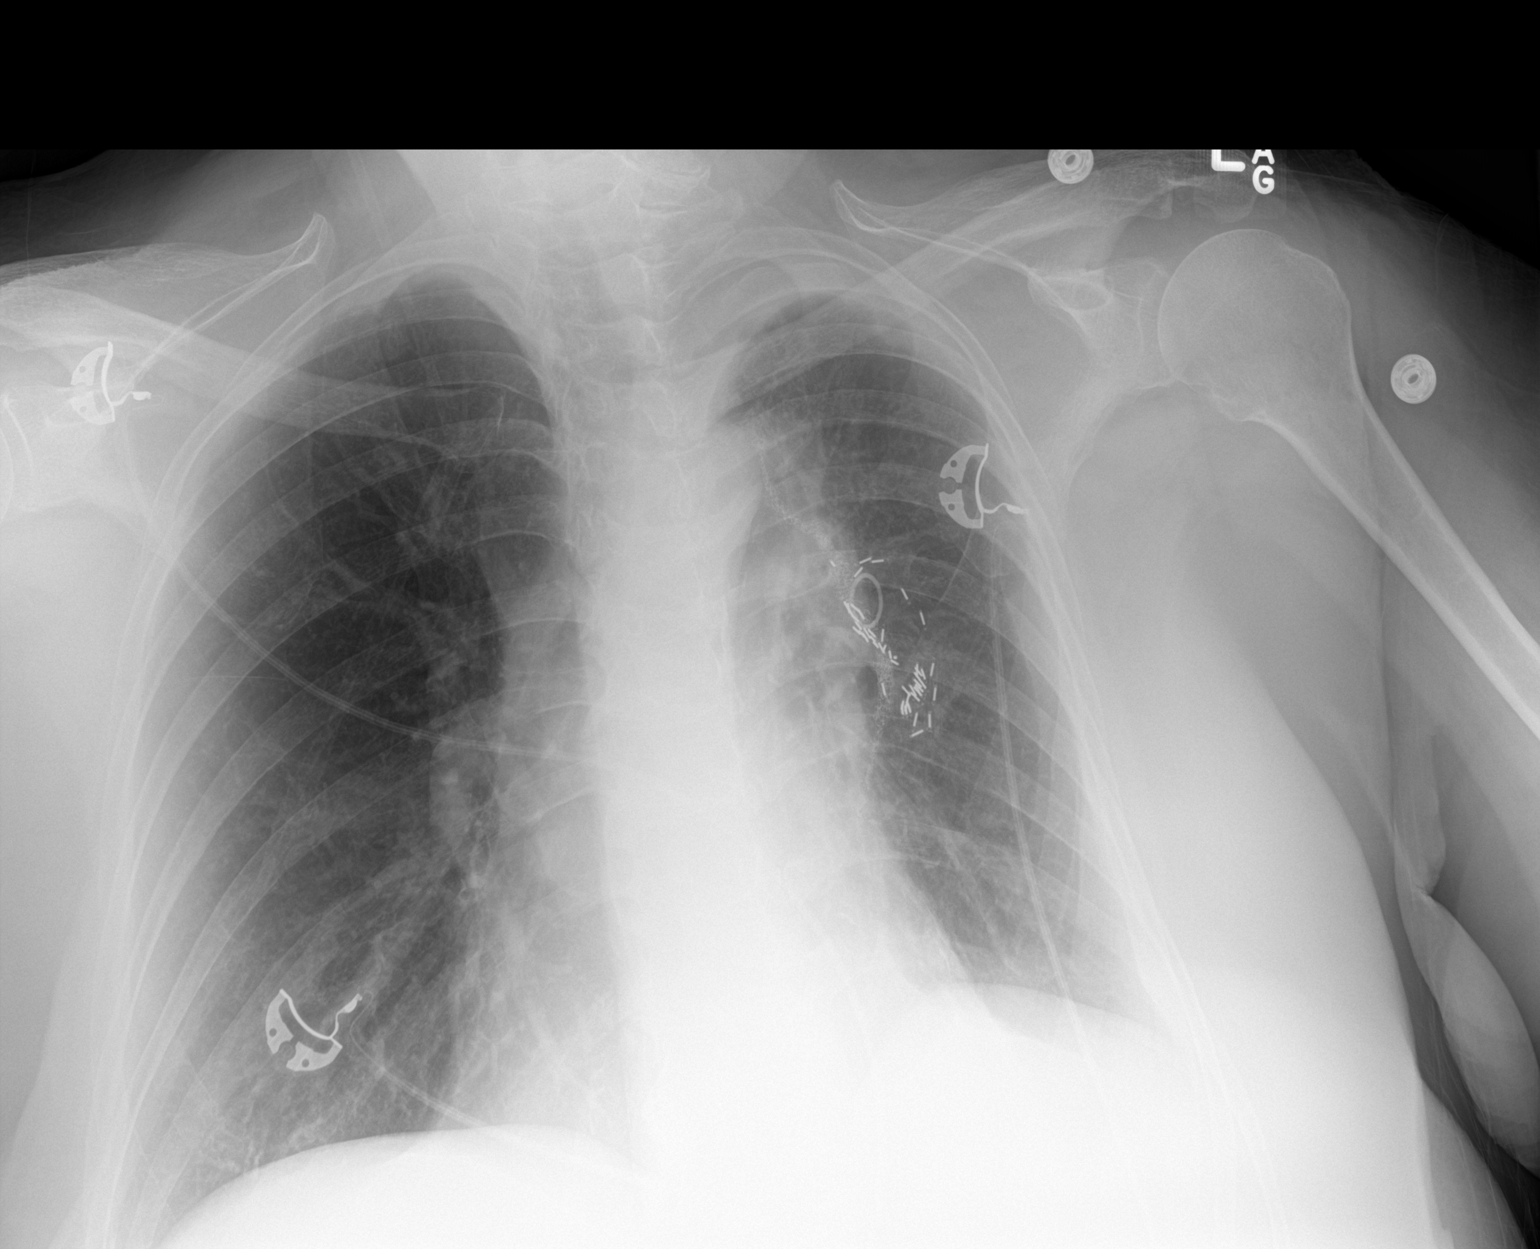

[1 of 1 positions shown; findings below may reference images not displayed]

FINDINGS: Stable normal cardiac silhouette given projection and technique.
Postsurgical changes in the left hilum. Ill-defined left suprahilar
soft tissue likely corresponding mass on prior PET CT. Small left
effusion. No new consolidation or pneumothorax identified. Bones are
unremarkable.
IMPRESSION: Ill-defined left hilar opacification likely corresponding to mass on
prior PET-CT. Small left effusion.

By: Ivette Belman M.D.
